# Patient Record
Sex: Male | Born: 1940 | Race: White | Hispanic: No
Health system: Midwestern US, Academic
[De-identification: ages and names within clinical notes are randomized; demographics above are authoritative.]

---

## 2016-06-11 MED ORDER — AMIODARONE 200 MG PO TAB
ORAL_TABLET | Freq: Two times a day (BID) | ORAL | 1 refills | 42.00000 days | Status: DC
Start: 2016-06-11 — End: 2016-10-23

## 2016-07-02 ENCOUNTER — Encounter: Admit: 2016-07-02 | Discharge: 2016-07-02 | Payer: MEDICARE

## 2016-07-02 DIAGNOSIS — Z79899 Other long term (current) drug therapy: ICD-10-CM

## 2016-07-02 DIAGNOSIS — I10 Essential (primary) hypertension: ICD-10-CM

## 2016-07-02 DIAGNOSIS — I480 Paroxysmal atrial fibrillation: Principal | ICD-10-CM

## 2016-07-02 NOTE — Progress Notes
Labs and/or CXR for amiodarone surveillance due per Amiodarone protocol.  Lab requisitions/orders and letter explaining need for testing mailed to patient or given to patient in clinic.

## 2016-07-14 ENCOUNTER — Encounter: Admit: 2016-07-14 | Discharge: 2016-07-14 | Payer: MEDICARE

## 2016-07-14 MED ORDER — ELIQUIS 5 MG PO TAB
ORAL_TABLET | Freq: Two times a day (BID) | 3 refills | Status: SS
Start: 2016-07-14 — End: 2016-12-07

## 2016-07-16 ENCOUNTER — Ambulatory Visit: Admit: 2016-07-16 | Discharge: 2016-07-16 | Payer: MEDICARE

## 2016-07-16 DIAGNOSIS — I10 Essential (primary) hypertension: ICD-10-CM

## 2016-07-16 DIAGNOSIS — Z79899 Other long term (current) drug therapy: ICD-10-CM

## 2016-07-16 DIAGNOSIS — I480 Paroxysmal atrial fibrillation: Principal | ICD-10-CM

## 2016-07-16 LAB — LIVER FUNCTION PANEL
Lab: 0.2 mg/dL (ref <0.4)
Lab: 0.9 mg/dL (ref 0.3–1.2)
Lab: 103 U/L (ref 25–110)
Lab: 36 U/L (ref 7–40)
Lab: 43 U/L (ref 7–56)
Lab: 6.5 g/dL (ref 6.0–8.0)

## 2016-07-16 LAB — FREE T4-FREE THYROXINE: Lab: 1.4 ng/dL (ref 0.6–1.6)

## 2016-07-16 LAB — TSH WITH FREE T4 REFLEX

## 2016-07-16 NOTE — Progress Notes
Amiodarone Monitoring status as of 07/16/16:     Amiodarone monitoring complete.  Next amiodarone review is due in 180 days.    Most recent lab results  Lab Results   Component Value Date/Time    AST 36 07/16/2016 11:27 AM    ALT 43 07/16/2016 11:27 AM    TSH <0.010 (L) 07/16/2016 11:27 AM    FREET4R 1.4 07/16/2016 11:27 AM         Procedures  Last chest X-Ray: 12/2015  Last PFT: completed  Last eye exam:

## 2016-07-16 NOTE — Progress Notes
LFT complete.  Awaitig TSH results.

## 2016-07-23 ENCOUNTER — Encounter: Admit: 2016-07-23 | Discharge: 2016-07-23 | Payer: MEDICARE

## 2016-07-23 MED ORDER — METOPROLOL TARTRATE 50 MG PO TAB
ORAL_TABLET | Freq: Two times a day (BID) | ORAL | 3 refills | 90.00000 days | Status: AC
Start: 2016-07-23 — End: 2016-07-29

## 2016-07-29 ENCOUNTER — Encounter: Admit: 2016-07-29 | Discharge: 2016-07-29 | Payer: MEDICARE

## 2016-07-29 MED ORDER — METOPROLOL TARTRATE 50 MG PO TAB
50 mg | ORAL_TABLET | Freq: Two times a day (BID) | ORAL | 3 refills | Status: SS
Start: 2016-07-29 — End: 2018-05-27

## 2016-07-29 NOTE — Telephone Encounter
Wife left a message to clarify metoprolol dosing.  Wife reports patient is on metoprolol 50 mg one tab twice daily, a refill was sent in as 2 tabs twice daily, but it appears according to our records it should be 50 mg twice daily.  Left message on patient's voicemail with correct directions.

## 2016-08-18 ENCOUNTER — Encounter: Admit: 2016-08-18 | Discharge: 2016-08-18 | Payer: MEDICARE

## 2016-08-18 MED ORDER — ATORVASTATIN 40 MG PO TAB
ORAL_TABLET | Freq: Every day | 3 refills | Status: AC
Start: 2016-08-18 — End: 2017-04-06

## 2016-08-25 ENCOUNTER — Encounter: Admit: 2016-08-25 | Discharge: 2016-08-25 | Payer: MEDICARE

## 2016-08-25 ENCOUNTER — Encounter: Admit: 2016-08-25 | Discharge: 2016-08-26 | Payer: MEDICARE

## 2016-08-25 DIAGNOSIS — R69 Illness, unspecified: Principal | ICD-10-CM

## 2016-10-07 ENCOUNTER — Encounter: Admit: 2016-10-07 | Discharge: 2016-10-07 | Payer: MEDICARE

## 2016-10-23 ENCOUNTER — Encounter: Admit: 2016-10-23 | Discharge: 2016-10-23 | Payer: MEDICARE

## 2016-10-23 ENCOUNTER — Ambulatory Visit: Admit: 2016-10-23 | Discharge: 2016-10-24 | Payer: MEDICARE

## 2016-10-23 DIAGNOSIS — I2510 Atherosclerotic heart disease of native coronary artery without angina pectoris: ICD-10-CM

## 2016-10-23 DIAGNOSIS — R011 Cardiac murmur, unspecified: ICD-10-CM

## 2016-10-23 DIAGNOSIS — I350 Nonrheumatic aortic (valve) stenosis: Principal | ICD-10-CM

## 2016-10-23 DIAGNOSIS — I480 Paroxysmal atrial fibrillation: ICD-10-CM

## 2016-10-23 DIAGNOSIS — I10 Essential (primary) hypertension: Principal | ICD-10-CM

## 2016-10-23 MED ORDER — AMIODARONE 200 MG PO TAB
200 mg | ORAL_TABLET | Freq: Every day | ORAL | 1 refills | 42.00000 days | Status: AC
Start: 2016-10-23 — End: 2017-02-24

## 2016-10-23 NOTE — Assessment & Plan Note
BP OK on current medical therapy.

## 2016-10-23 NOTE — Assessment & Plan Note
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  Planning repeat echocardiogram when I see him back in 6 months.

## 2016-10-23 NOTE — Progress Notes
Date of Service: 10/23/2016    Joel Hernandez is a 76 y.o. male.       HPI     Joel Hernandez was in the [place] office today for follow-up regarding coronary disease, paroxysmal atrial fibrillation, and aortic stenosis.    He has developed a foot drop on the right and has an appointment to see a [facility] neurosurgeon in a couple of weeks.  He is hopeful that whatever surgical procedure may be necessary could be done before the end of the year and we talked about management of his antiplatelet and anticoagulant medications should that be necessary.    He had typical angina leading up to the MI and coronary stent last December.  He has had no recurrence of these angina symptoms.    He has aortic stenosis and his last echocardiogram was in December, showing moderate stenosis at that time.  He has had the development of no heart failure or lightheadedness symptoms that would suggest significant progression of valve disease.    He has had no bleeding complications while on oral anticoagulation and antiplatelet medications.  He denies any TIA or stroke symptoms.           Vitals:    10/23/16 1320 10/23/16 1326   BP: 142/78 138/72   Pulse: 62    Weight: 99 kg (218 lb 3.2 oz)    Height: 1.829 m (6')      Body mass index is 29.59 kg/m???.     Past Medical History  Patient Active Problem List    Diagnosis Date Noted   ??? Coronary artery disease due to calcified coronary lesion 01/19/2016     12/2015 - NSTEMI, LAD stent     ??? Paroxysmal atrial fibrillation (HCC) 01/18/2016     01/2016 - AF noted during hospitalization for unstable angina.  Attempt at DC cardioversion unsuccessful.  DC'ed on amiodarone & Eliquis.    01/2016 - Successful outpatient cardioversion at [facility].  Amiodarone continued    02/2016 - Office follow-up--back in AF, asymptomatic with good rate control.     ??? Hypertension 09/15/2013     BP medication started in mid-30's     ??? Aortic stenosis 09/15/2013 09/20/2013:  Echo ([place]) shows moderate AS, mean gradient 20.  Peak gradient 44.1 mmHg.  AVA 2.1 cm2.  EF 72%.  Aorta normal.    12/2015:  Moderate AS by echo     ??? Obstructive sleep apnea 09/15/2013     Circa 2004 - Diagnosis established, CPAP started (Dr. Mayford Knife, pulmonologist in [facility])           Review of Systems   Constitution: Negative.   HENT: Negative.    Eyes: Negative.    Cardiovascular: Negative.    Respiratory: Negative.    Endocrine: Negative.    Hematologic/Lymphatic: Negative.    Skin: Negative.    Musculoskeletal: Positive for back pain and joint pain.   Gastrointestinal: Negative.    Genitourinary: Negative.    Neurological: Negative.    Psychiatric/Behavioral: Negative.    Allergic/Immunologic: Negative.        Physical Exam    Physical Exam   General Appearance: no distress   Skin: warm, no ulcers or xanthomas   Digits and Nails: no cyanosis or clubbing   Eyes: conjunctivae and lids normal, pupils are equal and round   Teeth/Gums/Palate: dentition unremarkable, no lesions   Lips & Oral Mucosa: no pallor or cyanosis   Neck Veins: normal JVP , neck veins are  not distended   Thyroid: no nodules, masses, tenderness or enlargement   Chest Inspection: chest is normal in appearance   Respiratory Effort: breathing comfortably, no respiratory distress   Auscultation/Percussion: lungs clear to auscultation, no rales or rhonchi, no wheezing   PMI: PMI not enlarged or displaced   Cardiac Rhythm: regular rhythm and normal rate   Cardiac Auscultation: S1, S2 normal, no rub, no gallop   Murmurs: grade 3 systolic murmur   Peripheral Circulation: normal peripheral circulation   Carotid Arteries: normal carotid upstroke bilaterally, no bruits   Radial Arteries: normal symmetric radial pulses   Abdominal Aorta: no abdominal aortic bruit   Pedal Pulses: normal symmetric pedal pulses   Lower Extremity Edema: no lower extremity edema   Abdominal Exam: soft, non-tender, no masses, bowel sounds normal Liver & Spleen: no organomegaly   Gait & Station: walks without assistance   Muscle Strength: normal muscle tone   Orientation: oriented to time, place and person   Affect & Mood: appropriate and sustained affect   Language and Memory: patient responsive and seems to comprehend information   Neurologic Exam: neurological assessment grossly intact   Other: moves all extremities      Problems Addressed Today  Encounter Diagnoses   Name Primary?   ??? Nonrheumatic aortic valve stenosis    ??? Essential hypertension    ??? Coronary artery disease due to calcified coronary lesion    ??? 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  Planning repeat echocardiogram when I see him back in 6 months.    Hypertension  BP OK on current medical therapy.    Coronary artery disease due to calcified coronary lesion  No recurrence of typical angina symptoms.  He's on Plavix without aspirin due to the use of Eliquis for PAF.  In December we'll call him to remind him to stop the Plavix and resume aspirin 81 mg/day.    If he needs to suspend Plavix for neurosurgical procedure before December I think that is OK.  We can address this issue if necessary, depending on surgical scheduling.    Paroxysmal atrial fibrillation (HCC)  He is done well on amiodarone and was in sinus rhythm today.  He has had no recurrence of the types of symptoms that he had while in atrial fibrillation.  He is tolerating oral anticoagulation without bleeding complications.      Current Medications (including today's revisions)  ??? amiodarone (CORDARONE) 200 mg tablet Take one tablet by mouth daily. Take with food.   ??? atorvastatin (LIPITOR) 40 mg tablet TAKE 1 TABLET EVERY DAY   ??? clopiDOGrel (PLAVIX) 75 mg tablet TAKE ONE TABLET BY MOUTH ONCE DAILY   ??? ELIQUIS 5 mg tablet TAKE 1 TABLET TWICE DAILY   ??? losartan(+) (COZAAR) 100 mg tablet Take 0.5 tablets by mouth daily. ??? metoprolol tartrate (LOPRESSOR) 50 mg tablet Take 1 tablet by mouth twice daily.   ??? nitroglycerin (NITROSTAT) 0.4 mg tablet Place 1 tablet under tongue every 5 minutes as needed for Chest Pain. Max of 3 tablets, call 911.   ??? tamsulosin (FLOMAX) 0.4 mg capsule Take 1 capsule by mouth daily.

## 2016-10-23 NOTE — Assessment & Plan Note
He is done well on amiodarone and was in sinus rhythm today.  He has had no recurrence of the types of symptoms that he had while in atrial fibrillation.  He is tolerating oral anticoagulation without bleeding complications.

## 2016-10-23 NOTE — Assessment & Plan Note
No recurrence of typical angina symptoms.  He's on Plavix without aspirin due to the use of Eliquis for PAF.  In December we'll call him to remind him to stop the Plavix and resume aspirin 81 mg/day.    If he needs to suspend Plavix for neurosurgical procedure before December I think that is OK.  We can address this issue if necessary, depending on surgical scheduling.

## 2016-11-07 ENCOUNTER — Ambulatory Visit: Admit: 2016-11-07 | Discharge: 2016-11-08 | Payer: MEDICARE

## 2016-11-07 ENCOUNTER — Encounter: Admit: 2016-11-07 | Discharge: 2016-11-07 | Payer: MEDICARE

## 2016-11-07 DIAGNOSIS — M21371 Foot drop, right foot: Principal | ICD-10-CM

## 2016-11-07 DIAGNOSIS — I4891 Unspecified atrial fibrillation: ICD-10-CM

## 2016-11-07 DIAGNOSIS — C4490 Unspecified malignant neoplasm of skin, unspecified: ICD-10-CM

## 2016-11-07 DIAGNOSIS — R011 Cardiac murmur, unspecified: ICD-10-CM

## 2016-11-07 DIAGNOSIS — G4730 Sleep apnea, unspecified: ICD-10-CM

## 2016-11-07 DIAGNOSIS — M48061 Spinal stenosis, lumbar region without neurogenic claudication: Principal | ICD-10-CM

## 2016-11-07 DIAGNOSIS — D696 Thrombocytopenia, unspecified: ICD-10-CM

## 2016-11-07 DIAGNOSIS — G4733 Obstructive sleep apnea (adult) (pediatric): Secondary | ICD-10-CM

## 2016-11-07 DIAGNOSIS — I10 Essential (primary) hypertension: Principal | ICD-10-CM

## 2016-11-07 MED ORDER — CEFAZOLIN INJ 1GM IVP
2 g | Freq: Once | INTRAVENOUS | 0 refills | Status: CN
Start: 2016-11-07 — End: ?

## 2016-11-07 NOTE — Progress Notes
Date of Service: 11/07/2016    Subjective:             Joel Hernandez is a 76 y.o. male.    History of Present Illness  Referring physician: Macario Hernandez (NP neurosurgery)    76 year old male who is referred to [place] from [facility] for evaluation of right foot drop.  He developed a foot drop in July of this year.   he does not know of any exact inciting event, however he has a new puppy and feels that he may have fallen a few times during that.  He has had some numbness in that foot as well.  He does not have a lot of pain.  He has been using a brace for his right foot, which has helped him with his foot drop.    He has had surgery in 2004 for a lumbar laminectomy at L4-5.  He then went on to have right hemilaminectomies at L2-3 and L3-4 in 2011.  These were both performed by Dr. Rosita Kea.    He saw Dr. Alonna Minium in [place], however, he is leaving.  He is therefore been sent to [place] for evaluation.    He is on Plavix for cardiac stent that was put in earlier this year in January.  He sees Dr. Edwinna Areola from radiology.  He is also on Eliquis for atrial fibrillation.     Review of Systems   Respiratory: Positive for apnea.         Uses CPAP   Cardiovascular:        Atrial Fibrillation    Gastrointestinal:        GERD   Genitourinary: Positive for frequency.   Musculoskeletal: Positive for back pain and gait problem.   Neurological: Positive for numbness.   All other systems reviewed and are negative.    Past Medical History:   Diagnosis Date   ??? Atrial fibrillation (HCC)    ??? Heart murmur 09/15/2013   ??? Hypertension 09/15/2013   ??? Skin cancer    ??? Sleep apnea      Past Surgical History:   Procedure Laterality Date   ??? SPINE SURGERY       History reviewed. No pertinent family history.  Social History     Social History   ??? Marital status: Married     Spouse name: N/A   ??? Number of children: N/A   ??? Years of education: N/A     Social History Main Topics   ??? Smoking status: Former Smoker ??? Smokeless tobacco: Never Used   ??? Alcohol use Yes   ??? Drug use: No   ??? Sexual activity: Not on file     Other Topics Concern   ??? Not on file     Social History Narrative   ??? No narrative on file       Objective:         ??? amiodarone (CORDARONE) 200 mg tablet Take one tablet by mouth daily. Take with food.   ??? atorvastatin (LIPITOR) 40 mg tablet TAKE 1 TABLET EVERY DAY   ??? clopiDOGrel (PLAVIX) 75 mg tablet TAKE ONE TABLET BY MOUTH ONCE DAILY   ??? ELIQUIS 5 mg tablet TAKE 1 TABLET TWICE DAILY   ??? losartan(+) (COZAAR) 100 mg tablet Take 0.5 tablets by mouth daily.   ??? metoprolol tartrate (LOPRESSOR) 50 mg tablet Take 1 tablet by mouth twice daily.   ??? nitroglycerin (NITROSTAT) 0.4 mg tablet Place 1  tablet under tongue every 5 minutes as needed for Chest Pain. Max of 3 tablets, call 911.   ??? tamsulosin (FLOMAX) 0.4 mg capsule Take 1 capsule by mouth daily.     There were no vitals filed for this visit.  There is no height or weight on file to calculate BMI.     Physical Exam  Constitutional: Well-developed, well nourished  HENT: Normocephalic, atraumatic, Oropharynx clear and moist  Eyes: Pupils equal round and reactive to light, Extraocular muscles normal  Cardiovascular: Heart sounds normal  Pulmonary: Effort normal  Abdominal: No tenderness  Musculoskeletal: ROM normal  Skin: Warm, dry  Neurological: A&amp;Ox3, speech normal  CN: Facial sensation equal, Smile equal, eye closure equal, hearing present bilaterally, palate rises  bilaterally, shoulder shrug equal, tongue protrusion midline  5/5 strength x 4 extremities except 2/5 right foot dorsiflexion, 0/5 EHL  Soft touch present x 4 extremities  Back incisions well healed    MRI of the lumbar spine reviewed which shows multilevel degenerative changes.  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  The area of interest is L4-5 given his right foot drop.  There appears to be right lateral foraminal stenosis and lateral recess stenosis.  The axial cut is at an odd angle, however the sagittals going laterally show that there is some impingement on the L5 nerve root as it exits.     Assessment and Plan:  76 year old male who presents with a right foot drop and foot numbness.  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  He has had 2 previous laminectomies in 2004 and 2011.           I discussed his case with the patient and his wife in the clinic today.  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  He has had this for about 3 months.  He has had multiple surgeries in the past.  I discussed with them that I would recommend doing a redo exploration of L4-5 with the focus on the lateral recess decompression at L4-5.  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  I would like to focus on his area of symptomatic concern and the possibility of needing future surgery may be needed at some point.    They would like to do the surgery on November 8 after his wife gets back from an out-of-town trip to [place].  He will need to see PAT clinic and get medical clearance.  He will also need to be off of his Plavix and Eliquis prior to and after surgery.    I am recommending an L4-5 redo laminectomy and lateral recess decompression.  I discussed the indications, risks, alternatives, and potential complications of the surgery.  I did tell him that there is an increased risk of CSF leak given the surgeries in the past and scar tissue formation.  I also told him and his wife that it is unpredictable if his foot drop will improve after surgery.  They wish to proceed with surgery.    Thank you for allowing me to take care of [name]. If you have any questions or concerns, do not hesitate to contact me at [phone].     Theotis Barrio MD

## 2016-11-10 ENCOUNTER — Encounter: Admit: 2016-11-10 | Discharge: 2016-11-10 | Payer: MEDICARE

## 2016-11-10 DIAGNOSIS — R69 Illness, unspecified: Principal | ICD-10-CM

## 2016-11-17 ENCOUNTER — Encounter: Admit: 2016-11-17 | Discharge: 2016-11-17 | Payer: MEDICARE

## 2016-11-20 ENCOUNTER — Encounter: Admit: 2016-11-20 | Discharge: 2016-11-20 | Payer: MEDICARE

## 2016-11-20 ENCOUNTER — Ambulatory Visit: Admit: 2016-11-20 | Discharge: 2016-11-21 | Payer: MEDICARE

## 2016-11-20 ENCOUNTER — Inpatient Hospital Stay: Admit: 2016-12-04 | Discharge: 2016-12-04 | Payer: MEDICARE

## 2016-11-20 DIAGNOSIS — I10 Essential (primary) hypertension: Principal | ICD-10-CM

## 2016-11-20 DIAGNOSIS — C4490 Unspecified malignant neoplasm of skin, unspecified: ICD-10-CM

## 2016-11-20 DIAGNOSIS — C44621 Squamous cell carcinoma of skin of unspecified upper limb, including shoulder: ICD-10-CM

## 2016-11-20 DIAGNOSIS — M21371 Foot drop, right foot: Secondary | ICD-10-CM

## 2016-11-20 DIAGNOSIS — G4730 Sleep apnea, unspecified: ICD-10-CM

## 2016-11-20 DIAGNOSIS — I2510 Atherosclerotic heart disease of native coronary artery without angina pectoris: ICD-10-CM

## 2016-11-20 DIAGNOSIS — I4891 Unspecified atrial fibrillation: ICD-10-CM

## 2016-11-20 DIAGNOSIS — R011 Cardiac murmur, unspecified: ICD-10-CM

## 2016-11-20 DIAGNOSIS — G4733 Obstructive sleep apnea (adult) (pediatric): ICD-10-CM

## 2016-11-20 DIAGNOSIS — E785 Hyperlipidemia, unspecified: ICD-10-CM

## 2016-11-20 DIAGNOSIS — I350 Nonrheumatic aortic (valve) stenosis: ICD-10-CM

## 2016-11-20 DIAGNOSIS — I214 Non-ST elevation (NSTEMI) myocardial infarction: ICD-10-CM

## 2016-11-20 LAB — BASIC METABOLIC PANEL
Lab: 0.9 mg/dL — ABNORMAL LOW (ref 0.4–1.24)
Lab: 105 MMOL/L (ref 98–110)
Lab: 14 mg/dL (ref 7–25)
Lab: 140 MMOL/L — ABNORMAL LOW (ref 137–147)
Lab: 28 MMOL/L (ref 21–30)
Lab: 3.7 MMOL/L (ref 3.5–5.1)
Lab: 7 pg (ref 3–12)
Lab: 9.4 mg/dL — ABNORMAL HIGH (ref 8.5–10.6)
Lab: >60 mL/min (ref >60)
Lab: >60 mL/min (ref >60)

## 2016-11-20 LAB — CBC: Lab: 6.9 10*3/uL (ref 4.5–11.0)

## 2016-11-20 NOTE — Pre-Anesthesia Patient Instructions
GENERAL INFORMATION    Before you come to the hospital  ??? Make arrangements for a responsible adult to drive you home and stay with you for 24 hours following surgery.  ??? Bath/Shower Instructions  ??? Take a bath or shower using the special soap given to you in PAC. Use half the bottle the night before, and the other half the morning of your procedure. Use clean towels with each bath or shower.  ??? Put on clean clothes after bath or shower.  Avoid using lotion and oils.  ??? If you are having surgery above the waist, wear a shirt that fastens up the front.  ??? Sleep on clean sheets if bath or shower is done the night before procedure.  ??? Leave money, credit cards, jewelry, and any other valuables at home. The [facility] is not responsible for the loss or breakage of personal items.  ??? Remove nail polish, makeup and all jewelry (including piercings) before coming to the hospital.  ??? The morning of your procedure:  ??? brush your teeth and tongue  ??? do not smoke  ??? do not shave the area where you will have surgery    What to bring to the hospital  ??? ID/ Insurance Card  ??? Medical Device card  ??? Official documents for legal guardianship   ??? Copy of your Living Will, Advanced Directives, and/or Durable Power of Attorney   ??? Small bag with a few personal belongings  ??? Cases for glasses/hearing aids/contact lens (bring solutions for contacts)  ??? Dress in clean, loose, comfortable clothing     Eating or drinking before surgery  ??? Do not eat or drink anything after 11:00 p.m. the day before your procedure (including gum, mints, candy, or chewing tobacco) OR follow the specific instructions you were given by your Surgeon.  ??? You may have WATER ONLY up to 2 hours before arriving at the hospital.  ??? Other instructions: None     Other instructions  Notify your surgeon if:  ??? there is a possibility that you are pregnant  ??? you become ill with a cough, fever, sore throat, nausea, vomiting or flu-like symptoms ??? you have any open wounds/sores that are red, painful, draining, or are new since you last saw  the doctor  ??? you need to cancel your procedure  ??? You will receive a call with your surgery arrival time from between 2:30pm and 4:30pm the last business day before your procedure.  If you do not receive a call, please call [phone] before 4:30pm or [phone] after 4:30pm.    Notify us at [phone] and ask for [employer]  ??? if you need to cancel your procedure  ??? if you are going to be late    Arrival at the hospital    [facility]  [address]  [place], [place] [address]    ? Park in the P5 parking garage located at [employer], [place], [place] [address].   ? Judee Clara parking is available in front of [employer] [facility] between the hours of 7:00 am and 4:00 pm Monday through Friday.  ? If parking in the P5 garage, take the east elevators in the parking garage to the second level and walk to the entrance of the [employer] [facility].    ? Enter through the 1st floor main entrance and check in with Information Desk.   ? If you are a woman between the ages of 65  and 55, and have not had a hysterectomy, you will be asked for a urine sample prior to surgery.  Please do not urinate before arriving in the Surgery Waiting Room.  Once there, check in and let the attendant know if you need to provide a sample.

## 2016-11-21 DIAGNOSIS — Z01810 Encounter for preprocedural cardiovascular examination: Principal | ICD-10-CM

## 2016-11-21 DIAGNOSIS — M21371 Foot drop, right foot: ICD-10-CM

## 2016-11-24 ENCOUNTER — Encounter: Admit: 2016-11-24 | Discharge: 2016-11-24 | Payer: MEDICARE

## 2016-11-24 DIAGNOSIS — D696 Thrombocytopenia, unspecified: Principal | ICD-10-CM

## 2016-11-25 ENCOUNTER — Encounter: Admit: 2016-11-25 | Discharge: 2016-11-25 | Payer: MEDICARE

## 2016-11-25 NOTE — Telephone Encounter
Navigation Intake Assessment Document    Patient Name:  Joel Hernandez  MRN:  [id number]  DOB:  10/03/1940  Insurance:  Medicare    Appointment Info:    Future Appointments  Date Time Provider [place]   12/01/2016 3:15 PM Gara Kroner, DO [facility] [place] Exam       Diagnosis & Reason for Visit:  Thrombocytopenia-surgical clearance.  Patient is scheduled with neurosurgery 12/04/2016 for right lumbar 4-5 re-do laminectomy.  Patient had pre-op labs drawn 11/20/2016 which revealed a platelet count of 91.   He has no previous history of thrombocytopenia.  In speaking with the patient he does not relay any symptoms.    He was referred to [facility] hematology services for further evaluation and surgical clearance.  All records are in [facility].    Physician Info:   Referring Physician:  Bridgette Habermann Neurosurgery  [id number]   PCP:  Darl Householder

## 2016-12-01 ENCOUNTER — Encounter: Admit: 2016-12-01 | Discharge: 2016-12-01 | Payer: MEDICARE

## 2016-12-01 DIAGNOSIS — Z85828 Personal history of other malignant neoplasm of skin: ICD-10-CM

## 2016-12-01 DIAGNOSIS — G4730 Sleep apnea, unspecified: ICD-10-CM

## 2016-12-01 DIAGNOSIS — C44621 Squamous cell carcinoma of skin of unspecified upper limb, including shoulder: ICD-10-CM

## 2016-12-01 DIAGNOSIS — M549 Dorsalgia, unspecified: ICD-10-CM

## 2016-12-01 DIAGNOSIS — I10 Essential (primary) hypertension: Principal | ICD-10-CM

## 2016-12-01 DIAGNOSIS — I4891 Unspecified atrial fibrillation: ICD-10-CM

## 2016-12-01 DIAGNOSIS — R011 Cardiac murmur, unspecified: ICD-10-CM

## 2016-12-01 DIAGNOSIS — I2510 Atherosclerotic heart disease of native coronary artery without angina pectoris: ICD-10-CM

## 2016-12-01 DIAGNOSIS — I214 Non-ST elevation (NSTEMI) myocardial infarction: ICD-10-CM

## 2016-12-01 DIAGNOSIS — D731 Hypersplenism: ICD-10-CM

## 2016-12-01 DIAGNOSIS — Z87891 Personal history of nicotine dependence: ICD-10-CM

## 2016-12-01 DIAGNOSIS — C4490 Unspecified malignant neoplasm of skin, unspecified: ICD-10-CM

## 2016-12-01 DIAGNOSIS — Z7901 Long term (current) use of anticoagulants: ICD-10-CM

## 2016-12-01 DIAGNOSIS — E785 Hyperlipidemia, unspecified: ICD-10-CM

## 2016-12-01 DIAGNOSIS — G4733 Obstructive sleep apnea (adult) (pediatric): ICD-10-CM

## 2016-12-01 DIAGNOSIS — I350 Nonrheumatic aortic (valve) stenosis: ICD-10-CM

## 2016-12-01 DIAGNOSIS — D696 Thrombocytopenia, unspecified: Principal | ICD-10-CM

## 2016-12-01 NOTE — Progress Notes
Name: Joel Hernandez          MRN: [id number]      DOB: [birth year]      AGE: 76 y.o.   DATE OF SERVICE: 12/01/2016    Subjective:             Reason for Visit:  No chief complaint on file.      Joel Hernandez is a 76 y.o. male who presents to my hematology clinic upon referral by his neurosurgeon Dr. Sherryll Burger for his thrombocytopenia. Patient has never been told his platelets were low.    Hematology history:  Diagnosis & Reason for Visit: ???  Thrombocytopenia-surgical clearance.  Patient is scheduled with neurosurgery 12/04/2016 for right lumbar 4-5 re-do laminectomy.  Patient had pre-op labs drawn 11/20/2016 which revealed a platelet count of 91.   He has no previous history of thrombocytopenia.  In speaking with the patient he does not relay any symptoms.    He was referred to [facility] hematology services for further evaluation and surgical clearance.  All records are in O2.  ???  Physician Info:  ??? Referring Physician: ???Marcelyn Bruins Neurosurgery  [id number]  ??? PCP:??? Mcgarrett Suzanna Obey      History of Present Illness  Past Medical History:    Past Medical History:   Diagnosis Date   ??? Aortic stenosis    ??? Atrial fibrillation (HCC)    ??? CAD (coronary artery disease)    ??? Heart murmur 09/15/2013   ??? Hyperlipidemia    ??? Hypertension 09/15/2013   ??? NSTEMI (non-ST elevated myocardial infarction) (HCC) 12/2015    s/p stent placement   ??? OSA on CPAP    ??? SCC (squamous cell carcinoma), arm    ??? Skin cancer    ??? Sleep apnea        Past Surgical History:   Past Surgical History:   Procedure Laterality Date   ??? LUMBAR LAMINECTOMY  2004    L4-5   ??? LUMBAR LAMINECTOMY  2011    Hemilaminectomies L 2-3 L3-4   ??? HEART CATHETERIZATION  01/2016    Stent x 1 LAD placement following NSTEMI   ??? SKIN CANCER EXCISION  2017, 2018    SCC Arm        Transfusion History: No    Medication list:   Current Outpatient Prescriptions:   ???  amiodarone (CORDARONE) 200 mg tablet, Take one tablet by mouth daily. Take with food., Disp: 60 tablet, Rfl: 1 ???  atorvastatin (LIPITOR) 40 mg tablet, TAKE 1 TABLET EVERY DAY (Patient taking differently: TAKE 1 TABLET BY MOUTH EVERY DAY), Disp: 90 tablet, Rfl: 3  ???  clopiDOGrel (PLAVIX) 75 mg tablet, TAKE ONE TABLET BY MOUTH ONCE DAILY, Disp: 90 tablet, Rfl: 3  ???  ELIQUIS 5 mg tablet, TAKE 1 TABLET TWICE DAILY, Disp: 180 tablet, Rfl: 3  ???  losartan(+) (COZAAR) 100 mg tablet, Take 0.5 tablets by mouth daily., Disp: 30 tablet, Rfl: 1  ???  metoprolol tartrate (LOPRESSOR) 50 mg tablet, Take 1 tablet by mouth twice daily., Disp: 360 tablet, Rfl: 3  ???  nitroglycerin (NITROSTAT) 0.4 mg tablet, Place 1 tablet under tongue every 5 minutes as needed for Chest Pain. Max of 3 tablets, call 911., Disp: 25 tablet, Rfl: 3  ???  tamsulosin (FLOMAX) 0.4 mg capsule, Take 1 capsule by mouth at bedtime daily., Disp: , Rfl:     Allergy List:   Allergies   Allergen Reactions   ??? Lisinopril SEE COMMENTS  Reports respiratory distress and back pain       Social History:   Social History     Social History   ??? Marital status: Married     Spouse name: N/A   ??? Number of children: N/A   ??? Years of education: N/A     Social History Main Topics   ??? Smoking status: Former Smoker     Packs/day: 0.50     Years: 15.00     Types: Cigarettes     Quit date: 11/20/1969   ??? Smokeless tobacco: Never Used   ??? Alcohol use No   ??? Drug use: No   ??? Sexual activity: Not on file     Other Topics Concern   ??? Not on file     Social History Narrative   ??? No narrative on file       Family History: No family history of any cancers.       Review of Systems   Constitutional: Negative for fatigue.   HENT: Negative for congestion.    Eyes: Negative for visual disturbance.   Respiratory: Positive for apnea. Negative for shortness of breath.    Cardiovascular: Negative for chest pain.   Gastrointestinal: Negative for abdominal pain.   Genitourinary: Positive for urgency. Negative for difficulty urinating.   Musculoskeletal: Positive for back pain.   Skin: Negative for rash. Neurological: Negative for headaches.   Hematological: Bruises/bleeds easily.   Psychiatric/Behavioral: Negative.          Objective:         ??? amiodarone (CORDARONE) 200 mg tablet Take one tablet by mouth daily. Take with food.   ??? atorvastatin (LIPITOR) 40 mg tablet TAKE 1 TABLET EVERY DAY (Patient taking differently: TAKE 1 TABLET BY MOUTH EVERY DAY)   ??? clopiDOGrel (PLAVIX) 75 mg tablet TAKE ONE TABLET BY MOUTH ONCE DAILY   ??? ELIQUIS 5 mg tablet TAKE 1 TABLET TWICE DAILY   ??? losartan(+) (COZAAR) 100 mg tablet Take 0.5 tablets by mouth daily.   ??? metoprolol tartrate (LOPRESSOR) 50 mg tablet Take 1 tablet by mouth twice daily.   ??? nitroglycerin (NITROSTAT) 0.4 mg tablet Place 1 tablet under tongue every 5 minutes as needed for Chest Pain. Max of 3 tablets, call 911.   ??? tamsulosin (FLOMAX) 0.4 mg capsule Take 1 capsule by mouth at bedtime daily.     There were no vitals filed for this visit.  There is no height or weight on file to calculate BMI.               Pain Addressed:  N/A    Patient Evaluated for a Clinical Trial: No treatment clinical trial available for this patient.     [place] Cooperative Oncology Group performance status is 0, Fully active, able to carry on all pre-disease performance without restriction.Marland Kitchen     Physical Exam   Constitutional: He is oriented to person, place, and time. He appears well-developed and well-nourished.   HENT:   Head: Normocephalic and atraumatic.   Eyes: Pupils are equal, round, and reactive to light.   Neck: Neck supple.   Cardiovascular: Normal rate and regular rhythm.    Murmur heard.  Pulmonary/Chest: Effort normal and breath sounds normal. He has no wheezes.   Abdominal: Soft. Bowel sounds are normal. He exhibits no mass.   Musculoskeletal: He exhibits no edema.   Lymphadenopathy:     He has no cervical adenopathy.        Right axillary:  No lateral adenopathy present.        Left axillary: No lateral adenopathy present. Right: No supraclavicular adenopathy present.        Left: No supraclavicular adenopathy present.   Neurological: He is alert and oriented to person, place, and time. No cranial nerve deficit.   Skin: No rash noted.   Psychiatric: He has a normal mood and affect.             Assessment and Plan:  1.Thrombocytopenia- I will recheck a CBC to see where the patient's counts are currently. I will check a CMP to check electrolytes and liver function as liver failure can be an etiology for thrombocytopenia.  I will check a B12 and folate as these nutritional deficiencies can be a cause of thrombocytopenia. I will order an abdominal ultrasound in the future as splenomegaly (hypersplenism) could be an etiology. I will consider a bone marrow biopsy in the future to rule out a marrow suppression issue.  Since he is having spine surgery, I would like his platelet count to be at greater than 100,000 so the patient will need likely a transfusion of 1 unit of platelets prior to surgery.  2. Return to clinic- Will have the patient return to clinic in three weeks.

## 2016-12-02 ENCOUNTER — Encounter: Admit: 2016-12-02 | Discharge: 2016-12-02 | Payer: MEDICARE

## 2016-12-02 DIAGNOSIS — D696 Thrombocytopenia, unspecified: Principal | ICD-10-CM

## 2016-12-02 LAB — FOLATE, SERUM

## 2016-12-02 LAB — COMPREHENSIVE METABOLIC PANEL

## 2016-12-02 LAB — CBC AND DIFF

## 2016-12-03 ENCOUNTER — Encounter: Admit: 2016-12-03 | Discharge: 2016-12-03 | Payer: MEDICARE

## 2016-12-03 DIAGNOSIS — D696 Thrombocytopenia, unspecified: Principal | ICD-10-CM

## 2016-12-04 ENCOUNTER — Encounter: Admit: 2016-12-04 | Discharge: 2016-12-04 | Payer: MEDICARE

## 2016-12-04 ENCOUNTER — Inpatient Hospital Stay: Admit: 2016-12-04 | Discharge: 2016-12-04 | Payer: MEDICARE

## 2016-12-04 DIAGNOSIS — I214 Non-ST elevation (NSTEMI) myocardial infarction: ICD-10-CM

## 2016-12-04 DIAGNOSIS — E785 Hyperlipidemia, unspecified: ICD-10-CM

## 2016-12-04 DIAGNOSIS — G4733 Obstructive sleep apnea (adult) (pediatric): ICD-10-CM

## 2016-12-04 DIAGNOSIS — C44621 Squamous cell carcinoma of skin of unspecified upper limb, including shoulder: ICD-10-CM

## 2016-12-04 DIAGNOSIS — G4730 Sleep apnea, unspecified: ICD-10-CM

## 2016-12-04 DIAGNOSIS — I2510 Atherosclerotic heart disease of native coronary artery without angina pectoris: ICD-10-CM

## 2016-12-04 DIAGNOSIS — C4490 Unspecified malignant neoplasm of skin, unspecified: ICD-10-CM

## 2016-12-04 DIAGNOSIS — I10 Essential (primary) hypertension: Principal | ICD-10-CM

## 2016-12-04 DIAGNOSIS — M549 Dorsalgia, unspecified: ICD-10-CM

## 2016-12-04 DIAGNOSIS — R011 Cardiac murmur, unspecified: ICD-10-CM

## 2016-12-04 DIAGNOSIS — I350 Nonrheumatic aortic (valve) stenosis: ICD-10-CM

## 2016-12-04 DIAGNOSIS — I4891 Unspecified atrial fibrillation: ICD-10-CM

## 2016-12-04 LAB — CBC
Lab: 115 10*3/uL — ABNORMAL LOW (ref 150–400)
Lab: 12 FL — ABNORMAL HIGH (ref 7–11)
Lab: 14 % (ref 11–15)
Lab: 14 g/dL (ref 13.5–16.5)
Lab: 32 pg (ref 26–34)
Lab: 33 g/dL (ref 32.0–36.0)
Lab: 4.5 M/UL (ref 4.4–5.5)
Lab: 43 % (ref 40–50)
Lab: 6.4 10*3/uL (ref 4.5–11.0)
Lab: 96 FL (ref 80–100)

## 2016-12-04 MED ORDER — TAMSULOSIN 0.4 MG PO CAP
.4 mg | Freq: Every evening | ORAL | 0 refills | Status: DC
Start: 2016-12-04 — End: 2016-12-07
  Administered 2016-12-05 – 2016-12-07 (×3): 0.4 mg via ORAL

## 2016-12-04 MED ORDER — ROCURONIUM 10 MG/ML IV SOLN
INTRAVENOUS | 0 refills | Status: DC
Start: 2016-12-04 — End: 2016-12-04
  Administered 2016-12-04: 14:00:00 90 mg via INTRAVENOUS

## 2016-12-04 MED ORDER — HYDROMORPHONE (PF) 2 MG/ML IJ SYRG
1-2 mg | Freq: Once | INTRAVENOUS | 0 refills | Status: DC | PRN
Start: 2016-12-04 — End: 2016-12-04

## 2016-12-04 MED ORDER — PHENYLEPHRINE IV DRIP (STD CONC)
0 refills | Status: DC
Start: 2016-12-04 — End: 2016-12-04
  Administered 2016-12-04 (×2): 1 ug/kg/min via INTRAVENOUS

## 2016-12-04 MED ORDER — OXYCODONE 5 MG PO TAB
5-10 mg | ORAL | 0 refills | Status: DC | PRN
Start: 2016-12-04 — End: 2016-12-07
  Administered 2016-12-05 – 2016-12-06 (×5): 5 mg via ORAL
  Administered 2016-12-07 (×2): 10 mg via ORAL

## 2016-12-04 MED ORDER — ONDANSETRON HCL (PF) 4 MG/2 ML IJ SOLN
INTRAVENOUS | 0 refills | Status: DC
Start: 2016-12-04 — End: 2016-12-04
  Administered 2016-12-04: 17:00:00 4 mg via INTRAVENOUS

## 2016-12-04 MED ORDER — VASOPRESSIN 20 UNIT/ML IV SOLN
0 refills | Status: DC
Start: 2016-12-04 — End: 2016-12-04
  Administered 2016-12-04: 15:00:00 2 [IU] via INTRAVENOUS
  Administered 2016-12-04 (×2): 1 [IU] via INTRAVENOUS

## 2016-12-04 MED ORDER — MAGNESIUM HYDROXIDE 2,400 MG/10 ML PO SUSP
10 mL | Freq: Every day | ORAL | 0 refills | Status: DC
Start: 2016-12-04 — End: 2016-12-07
  Administered 2016-12-04 – 2016-12-06 (×3): 10 mL via ORAL

## 2016-12-04 MED ORDER — ATORVASTATIN 40 MG PO TAB
40 mg | Freq: Every day | ORAL | 0 refills | Status: DC
Start: 2016-12-04 — End: 2016-12-07
  Administered 2016-12-05 – 2016-12-07 (×3): 40 mg via ORAL

## 2016-12-04 MED ORDER — CEFAZOLIN INJ 1GM IVP
2 g | Freq: Once | INTRAVENOUS | 0 refills | Status: CP
Start: 2016-12-04 — End: ?
  Administered 2016-12-04: 15:00:00 2 g via INTRAVENOUS

## 2016-12-04 MED ORDER — DIPHENHYDRAMINE HCL 50 MG/ML IJ SOLN
25 mg | Freq: Once | INTRAVENOUS | 0 refills | Status: DC | PRN
Start: 2016-12-04 — End: 2016-12-04

## 2016-12-04 MED ORDER — HALOPERIDOL LACTATE 5 MG/ML IJ SOLN
1 mg | Freq: Once | INTRAVENOUS | 0 refills | Status: DC | PRN
Start: 2016-12-04 — End: 2016-12-04

## 2016-12-04 MED ORDER — ONDANSETRON HCL (PF) 4 MG/2 ML IJ SOLN
4 mg | INTRAVENOUS | 0 refills | Status: DC | PRN
Start: 2016-12-04 — End: 2016-12-07

## 2016-12-04 MED ORDER — SENNOSIDES-DOCUSATE SODIUM 8.6-50 MG PO TAB
1 | Freq: Two times a day (BID) | ORAL | 0 refills | Status: DC
Start: 2016-12-04 — End: 2016-12-07
  Administered 2016-12-05 – 2016-12-07 (×5): 1 via ORAL

## 2016-12-04 MED ORDER — PROMETHAZINE 25 MG/ML IJ SOLN
6.25 mg | INTRAVENOUS | 0 refills | Status: DC | PRN
Start: 2016-12-04 — End: 2016-12-04

## 2016-12-04 MED ORDER — PNEUMOCOCCAL 23-VAL PS VACCINE 25 MCG/0.5 ML IJ SOLN
0.5 mL | Freq: Once | INTRAMUSCULAR | 0 refills | Status: DC
Start: 2016-12-04 — End: 2016-12-07

## 2016-12-04 MED ORDER — LIDOCAINE-EPINEPHRINE 1 %-1:100,000 IJ SOLN
0 refills | Status: DC
Start: 2016-12-04 — End: 2016-12-04
  Administered 2016-12-04: 16:00:00 8 mL via INTRAMUSCULAR

## 2016-12-04 MED ORDER — FENTANYL CITRATE (PF) 50 MCG/ML IJ SOLN
25 ug | INTRAVENOUS | 0 refills | Status: DC | PRN
Start: 2016-12-04 — End: 2016-12-04

## 2016-12-04 MED ORDER — ALBUMIN, HUMAN 5 % 500 ML IV SOLP (AN)(OSM)
0 refills | Status: DC
Start: 2016-12-04 — End: 2016-12-04
  Administered 2016-12-04: 17:00:00 via INTRAVENOUS

## 2016-12-04 MED ORDER — GABAPENTIN 300 MG PO CAP
300 mg | Freq: Once | ORAL | 0 refills | Status: CP
Start: 2016-12-04 — End: ?
  Administered 2016-12-04: 14:00:00 300 mg via ORAL

## 2016-12-04 MED ORDER — SODIUM CHLORIDE 0.9 % IV SOLP
1000 mL | INTRAVENOUS | 0 refills | Status: DC
Start: 2016-12-04 — End: 2016-12-05
  Administered 2016-12-04 (×2): 1000.000 mL via INTRAVENOUS
  Administered 2016-12-04: 12:00:00 1000 mL via INTRAVENOUS

## 2016-12-04 MED ORDER — PROPOFOL INJ 10 MG/ML IV VIAL
0 refills | Status: DC
Start: 2016-12-04 — End: 2016-12-04
  Administered 2016-12-04: 17:00:00 50 mg via INTRAVENOUS
  Administered 2016-12-04: 14:00:00 30 mg via INTRAVENOUS
  Administered 2016-12-04: 17:00:00 20 mg via INTRAVENOUS
  Administered 2016-12-04: 14:00:00 100 mg via INTRAVENOUS

## 2016-12-04 MED ORDER — BISACODYL 10 MG RE SUPP
10 mg | Freq: Every day | RECTAL | 0 refills | Status: DC
Start: 2016-12-04 — End: 2016-12-07
  Administered 2016-12-06: 15:00:00 10 mg via RECTAL

## 2016-12-04 MED ORDER — FENTANYL CITRATE (PF) 50 MCG/ML IJ SOLN
50 ug | INTRAVENOUS | 0 refills | Status: DC | PRN
Start: 2016-12-04 — End: 2016-12-04

## 2016-12-04 MED ORDER — OXYCODONE 5 MG PO TAB
5-10 mg | Freq: Once | ORAL | 0 refills | Status: DC | PRN
Start: 2016-12-04 — End: 2016-12-04

## 2016-12-04 MED ORDER — BUPIVACAINE-EPINEPHRINE 0.5 %-1:200,000 IJ SOLN
0 refills | Status: DC
Start: 2016-12-04 — End: 2016-12-04
  Administered 2016-12-04: 18:00:00 20 mL via INTRAMUSCULAR

## 2016-12-04 MED ORDER — ACETAMINOPHEN 1,000 MG/100 ML (10 MG/ML) IV SOLN
0 refills | Status: DC
Start: 2016-12-04 — End: 2016-12-04
  Administered 2016-12-04: 17:00:00 1000 mg via INTRAVENOUS

## 2016-12-04 MED ORDER — DEXAMETHASONE SODIUM PHOSPHATE 4 MG/ML IJ SOLN
INTRAVENOUS | 0 refills | Status: DC
Start: 2016-12-04 — End: 2016-12-04
  Administered 2016-12-04: 15:00:00 4 mg via INTRAVENOUS

## 2016-12-04 MED ORDER — METOPROLOL TARTRATE 50 MG PO TAB
50 mg | Freq: Two times a day (BID) | ORAL | 0 refills | Status: DC
Start: 2016-12-04 — End: 2016-12-07
  Administered 2016-12-05 – 2016-12-07 (×6): 50 mg via ORAL

## 2016-12-04 MED ORDER — DEXTRAN 70-HYPROMELLOSE (PF) 0.1-0.3 % OP DPET
0 refills | Status: DC
Start: 2016-12-04 — End: 2016-12-04
  Administered 2016-12-04: 14:00:00 2 [drp] via OPHTHALMIC

## 2016-12-04 MED ORDER — NITROGLYCERIN 0.4 MG SL SUBL
.4 mg | SUBLINGUAL | 0 refills | Status: DC | PRN
Start: 2016-12-04 — End: 2016-12-07

## 2016-12-04 MED ORDER — LIDOCAINE (PF) 10 MG/ML (1 %) IJ SOLN
.1-2 mL | INTRAMUSCULAR | 0 refills | Status: DC | PRN
Start: 2016-12-04 — End: 2016-12-04
  Administered 2016-12-04: 12:00:00 0.1 mL via INTRAMUSCULAR

## 2016-12-04 MED ORDER — THROMBIN (BOVINE) 5,000 UNIT TP SOLR
0 refills | Status: DC
Start: 2016-12-04 — End: 2016-12-04
  Administered 2016-12-04: 16:00:00 5000 [IU] via TOPICAL

## 2016-12-04 MED ORDER — LOSARTAN 50 MG PO TAB
100 mg | Freq: Every day | ORAL | 0 refills | Status: DC
Start: 2016-12-04 — End: 2016-12-06
  Administered 2016-12-04 – 2016-12-05 (×2): 100 mg via ORAL

## 2016-12-04 MED ORDER — AMIODARONE 200 MG PO TAB
200 mg | Freq: Every day | ORAL | 0 refills | Status: DC
Start: 2016-12-04 — End: 2016-12-07
  Administered 2016-12-05 – 2016-12-07 (×3): 200 mg via ORAL

## 2016-12-04 MED ORDER — METHOCARBAMOL 500 MG PO TAB
500 mg | Freq: Four times a day (QID) | ORAL | 0 refills | Status: DC
Start: 2016-12-04 — End: 2016-12-07
  Administered 2016-12-04 – 2016-12-07 (×11): 500 mg via ORAL

## 2016-12-04 MED ORDER — DOCUSATE SODIUM 100 MG PO CAP
100 mg | Freq: Two times a day (BID) | ORAL | 0 refills | Status: DC
Start: 2016-12-04 — End: 2016-12-07
  Administered 2016-12-05 – 2016-12-07 (×5): 100 mg via ORAL

## 2016-12-04 MED ORDER — FENTANYL CITRATE (PF) 50 MCG/ML IJ SOLN
25-50 ug | INTRAVENOUS | 0 refills | Status: DC | PRN
Start: 2016-12-04 — End: 2016-12-05

## 2016-12-04 MED ORDER — LIDOCAINE (PF) 200 MG/10 ML (2 %) IJ SYRG
0 refills | Status: DC
Start: 2016-12-04 — End: 2016-12-04
  Administered 2016-12-04: 14:00:00 200 mg via INTRAVENOUS

## 2016-12-04 MED ORDER — EPHEDRINE SULFATE 50 MG/5ML SYR (10 MG/ML) (AN)(OSM)
0 refills | Status: DC
Start: 2016-12-04 — End: 2016-12-04
  Administered 2016-12-04: 14:00:00 20 mg via INTRAVENOUS
  Administered 2016-12-04: 14:00:00 10 mg via INTRAVENOUS
  Administered 2016-12-04: 14:00:00 20 mg via INTRAVENOUS
  Administered 2016-12-04: 14:00:00 10 mg via INTRAVENOUS

## 2016-12-04 MED ORDER — BACITRACIN 50,000 UN NS 500 ML IRR BOT (OR)
0 refills | Status: DC
Start: 2016-12-04 — End: 2016-12-04
  Administered 2016-12-04 (×2): 500 mL

## 2016-12-04 MED ORDER — PHENYLEPHRINE IN 0.9% NACL(PF) 1 MG/10 ML (100 MCG/ML) IV SYRG
INTRAVENOUS | 0 refills | Status: DC
Start: 2016-12-04 — End: 2016-12-04
  Administered 2016-12-04 (×3): 100 ug via INTRAVENOUS
  Administered 2016-12-04: 15:00:00 150 ug via INTRAVENOUS
  Administered 2016-12-04 (×2): 100 ug via INTRAVENOUS

## 2016-12-04 MED ORDER — FENTANYL CITRATE (PF) 50 MCG/ML IJ SOLN
0 refills | Status: DC
Start: 2016-12-04 — End: 2016-12-04
  Administered 2016-12-04 (×2): 25 ug via INTRAVENOUS
  Administered 2016-12-04: 14:00:00 100 ug via INTRAVENOUS
  Administered 2016-12-04 (×2): 50 ug via INTRAVENOUS

## 2016-12-04 MED ORDER — LACTATED RINGERS IV SOLP
0 refills | Status: DC
Start: 2016-12-04 — End: 2016-12-04
  Administered 2016-12-04: 15:00:00 via INTRAVENOUS

## 2016-12-04 MED ORDER — ACETAMINOPHEN 325 MG PO TAB
650 mg | ORAL | 0 refills | Status: DC | PRN
Start: 2016-12-04 — End: 2016-12-07
  Administered 2016-12-06 – 2016-12-07 (×5): 650 mg via ORAL

## 2016-12-04 MED ORDER — SUGAMMADEX 100 MG/ML IV SOLN
INTRAVENOUS | 0 refills | Status: DC
Start: 2016-12-04 — End: 2016-12-04
  Administered 2016-12-04: 18:00:00 200 mg via INTRAVENOUS

## 2016-12-04 MED ORDER — CEFAZOLIN INJ 1GM IVP
2 g | INTRAVENOUS | 0 refills | Status: DC
Start: 2016-12-04 — End: 2016-12-05
  Administered 2016-12-04 – 2016-12-05 (×4): 2 g via INTRAVENOUS

## 2016-12-04 NOTE — Anesthesia Post-Procedure Evaluation
Post-Anesthesia Evaluation    Name: Joel Hernandez      MRN: [id number]     DOB: 06/03/1940     Age: 76 y.o.     Sex: male   __________________________________________________________________________     Procedure Date: 12/04/2016  Procedure: Procedure(s) with comments:  RIGHT LUMBAR 4-5 REDO LAMINECTOMY AND LATERAL RECESS DECOMPRESSION - CASE LENGTH 3 HOURS, WILSON FRAME  LAMINECTOMY/ FACETECTOMY/ FORAMINOTOMY WITH DECOMPRESSION - 1 VERTEBRAL SEGMENT - LUMBAR      Surgeon: Surgeon(s):  Reuben Likes, MD  Webb Silversmith, [facility]  Adonis Huguenin, MD    Post-Anesthesia Vitals  BP: 137/73 (11/08 1400)  Pulse: 64 (11/08 1345)  Respirations: 15 PER MINUTE (11/08 1345)  SpO2: 97 % (11/08 1400)  O2 Delivery: Nasal Cannula (11/08 1400)  SpO2 Pulse: 66 (11/08 1400)      Post Anesthesia Evaluation Note    Evaluation location: Pre/Post  Patient participation: recovered; patient participated in evaluation  Level of consciousness: alert    Pain score: 0  Pain management: adequate    Hydration: normovolemia  Airway patency: adequate    Perioperative Events  Perioperative events:  no       Post-op nausea and vomiting: no PONV    Postoperative Status  Cardiovascular status: hemodynamically stable  Respiratory status: spontaneous ventilation        Perioperative Events  Perioperative Event: No  Emergency Case Activation: No

## 2016-12-04 NOTE — Operative Report(Direct Entry)
Operative Note     Name: Joel Hernandez is a 76 y.o. male     DOB: Dec 07, 1940             MRN#: [id number]  DATE OF OPERATION: 12/04/2016    Surgeon(s) and Role:     Excell Seltzer, Elvin So, [place] - Assisting     * Wenda Overland, MD - Resident - Assisting     * Theotis Barrio, MD - Primary    Pre-op Diagnosis: Right footdrop, Right L5 radiculopathy    Post-op Diagnosis: Same    Procedure: Procedure(s) (LRB):  RIGHT LUMBAR 4-5 REDO LAMINECTOMY AND LATERAL RECESS DECOMPRESSION (Right)  LAMINECTOMY/ FACETECTOMY/ FORAMINOTOMY WITH DECOMPRESSION - 1 VERTEBRAL SEGMENT - LUMBAR    Anesthesia: General endotracheal anesthesia    Estimated Blood Loss:  125 ml    Drains: JP - suprafacial    Specimens: * No specimens in log *           Complications:  CSF leak    Implants: Lyoplant, DuraSeal Exact    Indication for Procedure: 76 year old male who presents with right sided foot drop and numbness and tingling consistent with a right L5 radiculopathy.  He has had at least 2 previous lumbar surgeries in the past.  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  After discussing the indications, risks, alternatives, and potential complications of the procedure, patient has elected to proceed with surgery.    Description of Procedure: After obtaining informed consent, patient was brought back to the operating room where orotracheal anesthesia was induced.  Patient was then flipped prone onto the [place] frame.  His previous incision was identified.  Chlorhexidine, alcohol, and ChloraPrep were applied in usual sterile fashion.  After confirming site, a proper timeout was performed.  They will towels, Ioban, and sterile drapes were applied in usual sterile fashion.  Local anesthetic was injected into the incision site.    A midline incision was made using a 10 blade scalpel.  Monopolar cautery was used to obtain hemostasis and dissect down.  There was a significant amount of scar tissue present making it difficult to delineate exactly where midline was.  We initially began dissecting down to her what we thought was the right side, however, we ended up finding out that this was the left side based on x-ray.  Therefore, we redirected our dissection to the right side.  We could identify the spinous process of L2 and the spinous process of L5, which was partial.  Using these landmarks could identify the lamina in the lateral recess.  Again, there was a significant amount of scar tissue present.    We could identify the gutter at L4 and began dissecting that.  We were working inferiorly towards L5, there was a significant amount of scar tissue present right near the neural foramen.  We then removed L5 and performed a laminectomy at L5.  There was a good plane that we could identify.  The thecal sac did appear compressed and looked like an hourglass.  As we were working towards the L4-5 interspace, there was a significant amount of scar present.  There is knuckling of the scar on the right side.  As we were dissecting there was a small injury to the dura causing CSF leakage.  The injury was very small and there was not even suturable flap.  We continue to work to decompress the right L4-5 interspace and neural foramen.  We specifically worked on lateral recess decompression.  Once it was decompressed, we were able to put a [employer] and [place] in the neural foramen.  We obtained an x-ray which confirmed this was indeed the correct level.    Hemostasis was obtained.  The wound was thoroughly irrigated with bacitracin irrigation.  A piece of Lyoplant was placed on top of the dura and DuraSeal exact was sprayed on top of it.  0 Vicryl sutures were used to close the fascia in a simple interrupted fashion.  This was done very tightly.  A single 0 Vicryl suture was used to close the fascia in a simple running fashion.  A 10 French Jackson-Pratt drain was placed in the suprafascial region.  Local anesthetic was injected into the muscular layer.  2-0 Vicryl sutures were used to close the deep dermal layer in an inverted, interrupted fashion.  The skin was closed using a 3-0 nylon suture in a simple running fashion.  The incision was cleaned and dressed in usual sterile manner.  The patient was then flipped supine onto the gurney, extubated, and taken to the recovery room in stable fashion.  All sponge and needle counts were correct.  I personally performed this procedure with the resident.    Disposition: PACU - hemodynamically stable.    Condition: stable      Post-op Instructions: Keep Flat.     Post-op Exam: Patient sedated, unable to perform neurological exam.    Theotis Barrio, MD  Pager [phone]

## 2016-12-04 NOTE — Progress Notes
Report given to RN on CA 7 for transfer of patient care

## 2016-12-04 NOTE — Progress Notes
Patient arrived to room CA706 via cart accompanied by transport. Patient transferred to the bed with assistance. Bedside safety checks completed. Initial patient assessment completed, refer to flowsheet for details. Admission skin assessment completed by:     Pressure Injury Present on Hospital Admission (within 24 hours): No    1. Occiput: No  2. Ear: No  3. Scapula: No  4. Spinous Process: No  5. Shoulder: No  6. Elbow: No  7. Iliac Crest: No  8. Sacrum/Coccyx: No  9. Ischial Tuberosity: No  10. Trochanter: No  11. Knee: No  12. Malleolus: No  13. Heel: No  14. Toes: No  15. Assessed for device associated injury Yes  16. Nursing Nutrition Assessment Completed Yes    See Doc Flowsheet for additional wound details.     INTERVENTIONS:

## 2016-12-04 NOTE — H&P (View-Only)
I have seen and examined the patient. There have been no interval changes from the H&P. Pt denies recent infection, fevers, chills, cp, or soa. Consent signed. We will proceed with Redo L4-5 laminectomy and lateral recess decompression    Theotis Barrio, MD    Most recent clinic note:    76 year old male who is referred to [place] from [facility] for evaluation of right foot drop.  He developed a foot drop in July of this year.   he does not know of any exact inciting event, however he has a new puppy and feels that he may have fallen a few times during that.  He has had some numbness in that foot as well.  He does not have a lot of pain.  He has been using a brace for his right foot, which has helped him with his foot drop.  ???  He has had surgery in 2004 for a lumbar laminectomy at L4-5.  He then went on to have right hemilaminectomies at L2-3 and L3-4 in 2011.  These were both performed by Dr. Rosita Kea.  ???  He saw Dr. Alonna Minium in [place], however, he is leaving.  He is therefore been sent to [place] for evaluation.  ???  He is on Plavix for cardiac stent that was put in earlier this year in January.  He sees Dr. Edwinna Areola from radiology.  He is also on Eliquis for atrial fibrillation.  Review of Systems   Respiratory: Positive for apnea.         Uses CPAP   Cardiovascular:        Atrial Fibrillation    Gastrointestinal:        GERD   Genitourinary: Positive for frequency.   Musculoskeletal: Positive for back pain and gait problem.   Neurological: Positive for numbness.   All other systems reviewed and are negative.  ???       Past Medical History:   Diagnosis Date   ??? Atrial fibrillation (HCC) ???   ??? Heart murmur 09/15/2013   ??? Hypertension 09/15/2013   ??? Skin cancer ???   ??? Sleep apnea ???   ???        Past Surgical History:   Procedure Laterality Date   ??? SPINE SURGERY ??? ???   ???  History reviewed. No pertinent family history.  Social History      ???        Social History   ??? Marital status: Married   ??? ??? Spouse name: N/A ??? Number of children: N/A   ??? Years of education: N/A   ???       Social History Main Topics   ??? Smoking status: Former Smoker   ??? Smokeless tobacco: Never Used   ??? Alcohol use Yes   ??? Drug use: No   ??? Sexual activity: Not on file   ???       Other Topics Concern   ??? Not on file   ???      Social History Narrative   ??? No narrative on file      ???  ???  Objective:         ??? amiodarone (CORDARONE) 200 mg tablet Take one tablet by mouth daily. Take with food.   ??? atorvastatin (LIPITOR) 40 mg tablet TAKE 1 TABLET EVERY DAY   ??? clopiDOGrel (PLAVIX) 75 mg tablet TAKE ONE TABLET BY MOUTH ONCE DAILY   ??? ELIQUIS 5 mg tablet TAKE 1  TABLET TWICE DAILY   ??? losartan(+) (COZAAR) 100 mg tablet Take 0.5 tablets by mouth daily.   ??? metoprolol tartrate (LOPRESSOR) 50 mg tablet Take 1 tablet by mouth twice daily.   ??? nitroglycerin (NITROSTAT) 0.4 mg tablet Place 1 tablet under tongue every 5 minutes as needed for Chest Pain. Max of 3 tablets, call 911.   ??? tamsulosin (FLOMAX) 0.4 mg capsule Take 1 capsule by mouth daily.   ???  There were no vitals filed for this visit.  There is no height or weight on file to calculate BMI.   ???  Physical Exam  Constitutional: Well-developed, well nourished  HENT: Normocephalic, atraumatic, Oropharynx clear and moist  Eyes: Pupils equal round and reactive to light, Extraocular muscles normal  Cardiovascular: Heart sounds normal  Pulmonary: Effort normal  Abdominal: No tenderness  Musculoskeletal: ROM normal  Skin: Warm, dry  Neurological: A&amp;Ox3, speech normal  CN: Facial sensation equal, Smile equal, eye closure equal, hearing present bilaterally, palate rises  bilaterally, shoulder shrug equal, tongue protrusion midline  5/5 strength x 4 extremities except 2/5 right foot dorsiflexion, 0/5 EHL  Soft touch present x 4 extremities  Back incisions well healed  ???  MRI of the lumbar spine reviewed which shows multilevel degenerative changes.  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  The area of interest is L4-5 given his right foot drop.  There appears to be right lateral foraminal stenosis and lateral recess stenosis.  The axial cut is at an odd angle, however the sagittals going laterally show that there is some impingement on the L5 nerve root as it exits.  Assessment and Plan:  76 year old male who presents with a right foot drop and foot numbness.  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  He has had 2 previous laminectomies in 2004 and 2011.      ???  I discussed his case with the patient and his wife in the clinic today.  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  He has had this for about 3 months.  He has had multiple surgeries in the past.  I discussed with them that I would recommend doing a redo exploration of L4-5 with the focus on the lateral recess decompression at L4-5.  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  I would like to focus on his area of symptomatic concern and the possibility of needing future surgery may be needed at some point.  ???  They would like to do the surgery on November 8 after his wife gets back from an out-of-town trip to [place].  He will need to see PAT clinic and get medical clearance.  He will also need to be off of his Plavix and Eliquis prior to and after surgery.  ???  I am recommending an L4-5 redo laminectomy and lateral recess decompression.  I discussed the indications, risks, alternatives, and potential complications of the surgery.  I did tell him that there is an increased risk of CSF leak given the surgeries in the past and scar tissue formation.  I also told him and his wife that it is unpredictable if his foot drop will improve after surgery.  They wish to proceed with surgery.  ???  Thank you for allowing me to take care of Joel Hernandez. If you have any questions or concerns,  do not hesitate to contact me at [phone]. ???  Theotis Barrio MD

## 2016-12-04 NOTE — Progress Notes
Per Dr. Manuella Ghazi due to patient platelet count being 115 on 12/04/16, he is okay with the patient not getting 1 unit of platelets before surgery since it is above 100. Dr. Manuella Ghazi will reevaluate during the procedure. 1 unit of platelets was sent to Pre/Post at 0547. The unit of platelets will be sent to the OR. Anesthesia is aware that the platelets expire at 0947 on 12/04/16.

## 2016-12-04 NOTE — Care Plan
RT Adult Assessment Note    NAME:Joel Hernandez             MRN: [id number]             DOB:05-29-1940          AGE: 76 y.o.  ADMISSION DATE: 12/04/2016             DAYS ADMITTED: LOS: 0 days    RT Treatment Plan:       Protocol Plan: Procedures  PAP: Place a nursing order for "IS Q1h While Awake" for any of Lung Expansion indicators  Oxygen/Humidity: O2 to keep SpO2 > 92%  Monitoring: Pulse oximetry BID & PRN    Additional Comments:  Impressions of the patient: No respiratory distress  Intervention(s)/outcome(s): Oxygen check/monitoring      Vital Signs:  Pulse: Pulse: 64  RR: Respirations: 16 PER MINUTE  SpO2: SpO2: 97 %  O2 Device: $$ O2 Device: Standby  Liter Flow: O2 Liter Flow: Comment (RA)  O2%:    Breath Sounds: All Breath Sounds: Clear (implies normal);Decreased  Respiratory Effort: Respiratory Effort: Non-Labored

## 2016-12-05 MED ORDER — FENTANYL CITRATE (PF) 50 MCG/ML IJ SOLN
25-50 ug | INTRAVENOUS | 0 refills | Status: DC | PRN
Start: 2016-12-05 — End: 2016-12-07

## 2016-12-05 MED ORDER — HEPARIN, PORCINE (PF) 5,000 UNIT/0.5 ML IJ SYRG
5000 [IU] | SUBCUTANEOUS | 0 refills | Status: DC
Start: 2016-12-05 — End: 2016-12-07
  Administered 2016-12-06 – 2016-12-07 (×4): 5000 [IU] via SUBCUTANEOUS

## 2016-12-05 NOTE — Progress Notes
0039 -  Notified Dr. Dellis Anes of pt with continued bloody UOP with foley and some small clots. No further orders at this time. Clarified orders for foley. Foley orders put in. Will continue to monitor.

## 2016-12-05 NOTE — Case Management (ED)
Case Management Admission Assessment    NAME:Joel Hernandez                          MRN: [id number]             DOB:10-13-1940          AGE: 76 y.o.  ADMISSION DATE: 12/04/2016             DAYS ADMITTED: LOS: 0 days      Today???s Date: 12/05/2016    Source of Information: Patient and EMR    Plan  Plan: CM Assessment, Assist PRN with SW/NCM Services, Discharge Planning for Home Anticipated  Patient is #1 Day Post-Op L4-5 redo laminectomy/decompression.   ??? Patient is unable to work with PT/OT yet due to bedrest precautions for CSF leak.  ??? NCM encouraged consideration of home health setup for patient, and let him know he could cancel later if not needed - patient declined, stating very good home support.  ??? CM needs are not fully known; Neurosurgery CM team to continue to follow patient's plan of care via EMR and team huddle; will assist with discharge planning needs as indicated.    Assessment Notes  Mr. Joel Hernandez is lying in bed at time of NCM assessment; agreeable to completing assessment at this time.   ??? NCM met with patient and provided contact information and explanation of CM roles. He was encouraged to contact case management with questions and concerns during hospitalization.   ??? Patient lives with his wife in a two-story home with zero steps in (if entering from garage) and a flight of stairs up to bedroom level.  ??? Home support is assessed to be consistent, 24/7 if needed.  ??? Patient's previous HH, LTACH, SNF, IPR, DME, outpatient therapy experience includes:  ??? Outpatient cardiac rehab ([facility])  ??? DME [facility], shower chair)     Patient Address/Phone  [address]  [place] [address]  [phone] (home)     Emergency Contact  Extended Emergency Contact Information  Primary Emergency Contact: Joel, Hernandez States  Home Phone: [phone]  Mobile Phone: [phone]  Relation: Spouse  Secondary Emergency Contact: Joel Hernandez States  Home Phone: [phone]  Relation: Son Network engineer Directive: Yes, patient has a healthcare directive  Type of Healthcare Directive: Durable power of attorney for healthcare, Psychologist, sport and exercise of Healthcare Directive: Copy in paper chart  Psych Advance Directive (Psych unit only): No, patient does not have a Health visitor  Does the patient need discharge transport arranged?: No  Transportation Name, Phone and Availability #1: Patient's daughter (here in [facility]) or son Joel Hernandez from [place] will drive him home  Does the patient use Medicaid Transportation?: No    Expected Discharge Date  Expected Discharge Date: 12/06/16  ??? Expected discharge date pending patient's medical stability; medical milestones: PT/OT evaluations, pain control, adequate return of GI function, DCP.     Living Situation Prior to Admission  ? Living Arrangements  Type of Residence: Home, independent  Living Arrangements: Spouse/significant other  How many levels in the residence?: 2  Can patient live on one level if needed?: No  Does residence have entry and/or side stairs?: No (If entering from garage)  Assistance needed prior to admit or anticipated on discharge: Yes (Anticipated)  Who provides assistance or could if needed?: Patient's wife, who is in good health. Adult son  lives locally, and patient also has friends he could call on if needed.  Are they in good health?: Yes  Can support system provide 24/7 care if needed?: Yes  ? Level of Function   Prior level of function: Independent   ? Cognitive Abilities   Cognitive Abilities: Alert and Oriented, Engages in problem solving and planning, Participates in decision making, Recognizes impact of health condition on lifestyle    Financial Resources  ? Coverage  Primary Insurance: Medicare  Secondary Insurance: Medicare Supplement  Additional Coverage: RX (Rx coverage is through [facility], and patient states no difficulty with affording monthly medications. 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 Home Rx is [facility] in [place] from [place].)      [facility] [address] [place], [place] - [address]  Phone: [phone] Fax: [phone]    Payor: MEDICARE / Plan: MEDICARE PART A AND B / Product Type: Medicare /   ? Source of Income   Source Of Income: Other retirement income - Retired Occupational hygienist from [employer]  ? Financial Assistance Needed?  Denies need for financial assistance including Rx coverage    Psychosocial Needs  ? Mental Health  Mental Health History: No  ? Substance Use History  Patient denies substance use issues.    Current/Previous Services  ? PCP  Lavone Orn, [phone], [phone]   Patient is current with PCP; has been seen within the past year.  ? Pharmacy    [facility] [address], [place] - [address]  [place] [address]  Phone: [phone] Fax: [phone]    [facility] [place], [place] - [address]  [address]  [place] [address]  Phone: [phone] Fax: [phone]    ? Durable Medical Equipment   Durable Medical Equipment at home: Single [employer], Shower Chair  ? Home Health  Receiving home health: No  ? Hemodialysis or Peritoneal Dialysis  Undergoing hemodialysis or peritoneal dialysis: No  ? Tube/Enteral Feeds  Receive tube/enteral feeds: No  ? Infusion  Receive infusions: No  ? Private Duty  Private duty help used: No  ? Home and Community Based Services  Home and community based services: No  ? [employer]: N/A  ? Hospice  Hospice: No  ? Outpatient Therapy  PT: No (But enjoyed outpatient cardiac rehab at [facility] this year.)  OT: No  SLP: No  ? Skilled Nursing Facility/Nursing Home  SNF: No  NH: No  ? Inpatient Rehab  IPR: No  ? Long-Term Acute Care Hospital  LTACH: No  ? Acute Hospital Stay  Acute Hospital Stay: In the past  Was patient's stay within the last 30 days?: No  When did patient receive care?: 03/2016 Name of hospital: [facility]  Related or Unrelated?: Unrelated    Williemae Area BSN, RN  Neurosurgery Nurse Case Manager  [phone]

## 2016-12-05 NOTE — Progress Notes
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 Aortic stenosis    ??? Atrial fibrillation (HCC)    ??? Back pain    ??? CAD (coronary artery disease)    ??? Heart murmur 09/15/2013   ??? Hyperlipidemia    ??? Hypertension 09/15/2013   ??? NSTEMI (non-ST elevated myocardial infarction) (HCC) 12/2015    s/p stent placement   ??? OSA on CPAP    ??? SCC (squamous cell carcinoma), arm    ??? Skin cancer    ??? Sleep apnea        Mobility  Progressive Mobility Level: Walk in hallway  Distance Walked (feet): 100 ft  Level of Assistance: Assist X1  Assistive Device: None  Time Tolerated: 11-30 minutes  Activity Limited By: Fatigue;Weakness    Subjective  Pertinent Dx per Physician: 76 y/o M with R foot drop, s/p L4-5 re-do laminectomy and lateral recess decompression  Precautions: Standard;Falls  Pain / Complaints: Patient agrees to participate in therapy  Pain Location: Incisional  Pain Level Current: 3  Comments: Patient on CSF leak precuations in AM. First time out of bed this afternoon.    Objective  Psychosocial Status: Willing and Cooperative to Participate  Persons Present: Family;Physical Therapist    Home Living  Type of Home: House  Home Layout: Two Level;Bed/Bath Upstairs  Financial risk analyst / Tub: Pension scheme manager: Standard  Comment: Patient reports a full flight of stairs from the main level to the second level.     Prior Function  Level Of Independence: Independent with ADLs and functional transfers;Independent with homemaking w/ ambulation  Lives With: Spouse  Receives Help From: Spouse  Other Function Comments: Patient uses an AFO on the right foot prior to surgery.     Vision  Current Vision: Does Not Wear Glasses  Comment: No acute vision deficits.    ADL's Where Assessed: Standing at Sink;Edge of Bed  Grooming Assist: Stand By Assist  Grooming Deficits: Teeth Care  LE Dressing Assist: Minimal Assist  LE Dressing Deficits: Don/Doff R Sock;Don/Doff L Sock  Functional Transfer Assist: Moderate Assist  Functional Transfer Deficits: Steadying;Supervision/Safety  Comment: Supine to sit using log roll tachnique and moderate assist x2. Patient ambulates with minimum assist x2 to minimum assist x1 at end of session. Patient with right side foot drop impacting mobility. Patient hesitant to use an assistive device and states Ill make it when referring to home discharge.    Activity Tolerance  Endurance: 3/5 Tolerates 25-30 Minutes Exercise w/Multiple Rests  Sitting Balance: 5/5 Moves/Returns Trunkal Midpoint in All Planes > 2 Inches  Comment: Patient on flat bedrest following surgery. Anticipate patients activity limitations are due to first time out of bed and will improve with upright tolerance.    Cognition  Overall Cognitive Status: WFL to Adequately Complete Self Care Tasks Safely  Orientation: Alert & Oriented x4  Attention: Awake/Alert    UE PROM  Overall BUE PROM WNL: Yes    UE AROM  Overall BUE AROM WNL: Yes  Coordination: Adequate to Complete ADLs  Grasp: Bilateral Grasp Functional for Activity    Edema  RUE Edema: No Significant Edema  LUE Edema: No Significant Edema    UE Strength / Tone  Overall Strength / Tone: WFL Able to Perform ADL Tasks  Overall Strength < 4/5: No    Education  Persons Educated: Patient  Barriers To Learning: None Noted  Teaching Methods: Verbal Instruction  Patient Response: Verbalized Understanding  Topics: Role of OT, Goals for Therapy;Home safety  Goal Formulation: With Patient    Assessment  Assessment: Decreased ADL Status;Decreased Self-Care Trans;Decreased High-Level ADLs  Prognosis: Good  Goal Formulation: Patient    AM-PAC 6 Clicks Daily Activity Inpatient  Putting on and taking off regular lower body clothes?: A Little Bathing (Including washing, rinsing, drying): A Little  Toileting, which includes using toilet, bedpan, or urinal: A Little  Putting on and taking off regular upper body clothing: None  Taking care of personal grooming such as brushing teeth: None  Eating meals?: None  Daily Activity Raw Score: 21  Standardized (t-scale) score: 44.27  CMS 0-100% Score: 32.79  CMS G Code Modifier: CJ    Plan  OT Frequency: 5x/week  OT Plan for Next Visit: Functional transfers indepedently including toilet transfers.Endurance for home discharge. Patient may benefit fro massistive device for home discharge as patient is currently refusing HH or placement.    Further Evaluation Goals  Pt Will Tolerate Further ADL Evaluation: w/in1-2 sessions    ADL Goals  Patient Will Perform All ADL's: w/ Stand By Assist  Patient Will Perform LE Dressing: w/ Stand By Assist  Patient Will Perform Toileting: w/ Stand By Assist    Functional Transfer Goals  Pt Will Perform All Functional Transfers: w/ Stand By Assist    OT Discharge Recommendations  OT Discharge Recommendations: Home with family assist, Home with Home Health, Vs, Inpatient Setting  At current level of function would recommend inpatient setting due to the following barriers: difficulty standing from edge of bed, mobility deficits, full flight of stairs to bedroom/bathroom. Anticipate pt may progress with increased out of bed activity to return home with family assist.  Equipment Recommendations: Shower Chair  Recommend ongoing assistance for: In and out of house, Transfers, Bed mobility, Ambulation, Stairs, Safety concerns    G-Codes: Self-care  [phone] Current Status:  20-39% Impairment  [id number] Goal Status:  1-19% Impairment     Based on above evaluation and clinical judgment.    Therapist: Roxy Horseman, OTR/L [phone]  Date: 12/05/2016

## 2016-12-05 NOTE — Progress Notes
OCCUPATIONAL THERAPY  NO TREATMENT NOTE     Attempted to see patient for Occupational therapy evaluation. Patient on strict bedrest this morning for a CSF leak per discussion with RN. Will follow up and provide intervention as appropriate.     Therapist: Clearence Cheek, OTR/L [phone]  Date: 12/05/2016

## 2016-12-05 NOTE — Anesthesia Pain Rounding
Anesthesia Follow-Up Evaluation: Post-Procedure Day One    Name: Joel Hernandez     MRN: [id number]     DOB: 04/17/1940     Age: 76 y.o.     Sex: male   __________________________________________________________________________     Procedure Date: 12/04/2016   Procedure: Procedure(s) with comments:  RIGHT LUMBAR 4-5 REDO LAMINECTOMY AND LATERAL RECESS DECOMPRESSION - CASE LENGTH 3 HOURS, WILSON FRAME  LAMINECTOMY/ FACETECTOMY/ FORAMINOTOMY WITH DECOMPRESSION - 1 VERTEBRAL SEGMENT - LUMBAR    Physical Assessment  Height: 182.9 cm (72)  Weight: 98.9 kg (218 lb 0.6 oz)    Vital Signs (Last Filed in 24 hours)  BP: 123/65 (11/09 0742)  Temp: 36.3 ???C (97.4 ???F) (11/09 1914)  Pulse: 56 (11/09 0742)  Respirations: 17 PER MINUTE (11/09 0742)  SpO2: 95 % (11/09 0742)  O2 Delivery: None (Room Air) (11/09 0742)  SpO2 Pulse: 66 (11/08 1400)    Patient History   Allergies  Allergies   Allergen Reactions   ??? Lisinopril SEE COMMENTS     Reports respiratory distress and back pain        Medications  Scheduled Meds:  amiodarone (CORDARONE) tablet 200 mg 200 mg Oral QDAY   atorvastatin (LIPITOR) tablet 40 mg 40 mg Oral QDAY   bisacodyl (DULCOLAX) rectal suppository 10 mg 10 mg Rectal QDAY   ceFAZolin (ANCEF) IVP 2 g 2 g Intravenous Q8H*   docusate (COLACE) capsule 100 mg 100 mg Oral BID   losartan (COZAAR) tablet 100 mg 100 mg Oral QDAY   methocarbamol (ROBAXIN) tablet 500 mg 500 mg Oral QID   metoprolol tartrate (LOPRESSOR) tablet 50 mg 50 mg Oral BID   milk of magnesia (CONC) oral suspension 10 mL 10 mL Oral QDAY   pneumococcal 23-val vaccine (PPSV23) (PNEUMOVAX 23) injection 0.5 mL 0.5 mL Intramuscular ONCE   senna/docusate (SENOKOT-S) tablet 1 tablet 1 tablet Oral BID   tamsulosin (FLOMAX) capsule 0.4 mg 0.4 mg Oral QHS   Continuous Infusions:  PRN and Respiratory Meds:acetaminophen Q4H PRN, fentaNYL citrate PF Q1H PRN, nitroglycerin Q5 MIN PRN, ondansetron (ZOFRAN) IV Q6H PRN, oxyCODONE Q4H PRN      Diagnostic Tests Hematology: Lab Results   Component Value Date    HGB 14.7 12/04/2016    HCT 43.8 12/04/2016    PLTCT 115 12/04/2016    WBC 6.4 12/04/2016    NEUT 69 01/18/2016    ANC 7.30 01/18/2016    ALC 2.40 01/18/2016    MONA 7 01/18/2016    AMC 0.70 01/18/2016    EOSA 2 01/18/2016    ABC 0.00 01/18/2016    MCV 96.5 12/04/2016    MCH 32.3 12/04/2016    MCHC 33.5 12/04/2016    MPV 12.1 12/04/2016    RDW 14.5 12/04/2016         General Chemistry: Lab Results   Component Value Date    NA 140 11/20/2016    K 3.7 11/20/2016    CL 105 11/20/2016    CO2 28 11/20/2016    GAP 7 11/20/2016    BUN 14 11/20/2016    CR 0.99 11/20/2016    GLU 111 11/20/2016    CA 9.4 11/20/2016    ALBUMIN 4.1 07/16/2016    MG 2.0 04/01/2016    TOTBILI 0.9 07/16/2016    PO4 2.9 01/18/2016      Coagulation:   Lab Results   Component Value Date    PTT 46.7 01/18/2016    INR 1.1 01/18/2016  Follow-Up Assessment  Patient location during evaluation: floor      Anesthetic Complications:   Anesthetic complications: The patient did not experience any anesthestic complications.      Pain:  Score: 0    Management:adequate     Level of Consciousness: awake and alert   Hydration:acceptable     Airway Patency: patent   Respiratory Status: acceptable and room air     Cardiovascular Status:acceptable and hemodynamically stable   Regional/Neuroaxial:

## 2016-12-05 NOTE — Progress Notes
PHYSICAL THERAPY  ASSESSMENT     MOBILITY:  Mobility  Progressive Mobility Level: Walk in hallway  Distance Walked (feet): 100 ft  Level of Assistance: Assist X1  Assistive Device: None  Time Tolerated: 11-30 minutes  Activity Limited By: Fatigue;Weakness    SUBJECTIVE:  Subjective  Significant hospital events: 76 y/o M with R foot drop, s/p L4-5 re-do laminectomy and lateral recess decompression  Mental / Cognitive Status: Alert;Oriented;Cooperative;Follows Commands  Persons Present: Occupational Therapist;Family  Pain: Patient has no complaint of pain  Comments: Pt off bed rest per RN and cleared for out of bed activity prior to session.   Ambulation Assist: Independent Mobility in [employer] without Device  Patient Owned Equipment: Single [employer] Situation: Lives with Family  Type of Home: House  Entry Stairs: No Stairs  In-Home Stairs: 1-2 Flights of Stairs;Rail on 1 Side  Comments: Pt lives at home with wife in [place] home (bedroom on 2nd floor; pt previously independent with ADLs and mobility without assistive devices. Pt was wearing AFO for R foot drop.     STRENGTH:  Strength  Overall Strength: Generalized Weakness  Strength Unable To Assess: Due to Pain/Surgery    POSTURE/NEURO:  Posture / Neurological  RLE Sensation/Proprioception: Impaired Light Touch (Diminished compared to LLE)    BED MOBILITY/TRANSFERS:  Bed Mobility/Transfers  Bed Mobility: Supine to Sit: Standby Assist;Requires Extra Time;Safety Considerations  Comments: Provided education to pt on log roll technique  Transfer Type: Sit to Stand  Transfer: Assistance Level: Moderate Assist;From;Bed  Transfer: Assistive Device: None  Transfers: Type Of Assistance: For Strength Deficit;For Safety Considerations;Requires Extra Time  Other Transfer Type: Stand to Sit  Other Transfer: Assistance Level: Minimal Assist;To;Bed Side Chair  Other Transfer: Assistive Device: None  Other Transfer: Type Of Assistance: For Strength Deficit;For Safety Considerations;Requires Extra Time  End Of Activity Status: Up in Chair;Nursing Notified;Instructed Patient to Request Assist with Mobility;Instructed Patient to Use Call Light (Chair alarm activated)  Comments: Cues for log roll. Pt denies dizziness, headache, lightheadedness during activity.    BALANCE:  Balance  Sitting Balance: Independent  Standing Balance: Static Standing Balance;Dynamic Standing Balance;Minimal Assist    GAIT:  Gait  Gait Distance: 100 feet  Gait: Assistance Level: Minimal Assist;Moderate Assist  Gait: Assistive Device: None  Gait: Descriptors: Decreased heel strike LLE;Decreased heel strike RLE;Decreased step length;Variable step length;Pace: Slow;Swing-Through Gait  Comments: Pt ambulates with slightly decreased toe clearance on RLE, although pt reports this is better than is typical. Pt demonstrates decreased step length bilaterally, requiring moderate assist for steadying at beginning of ambulation progressing to minimal assist.  Activity Limited By: Complaint of Fatigue;Weakness    ACTIVITY/EXERCISE:  Activity / Exercise  Sit Edge Of Bed: 5 minutes  Sit Edge Of Bed Assist: Independent    EDUCATION:  Education  Persons Educated: Patient  Patient Barriers To Learning: None Noted  Teaching Methods: Verbal Instruction;Demonstration  Patient Response: Verbalized Understanding;Return Demonstration  Topics: Plan/Goals of PT Interventions;Safety Awareness;Up with Assist Only;Importance of Increasing Activity;Ambulate With Nursing;Recommend Continued Therapy;Therapy Schedule    ASSESSMENT/PROGRESS:  Assessment/Progress  Impaired Mobility Due To: Post Surgical Changes;Decreased Strength;Decreased Activity Tolerance  Impaired Strength Due To: Post Surgical Changes;Decreased Activity Tolerance  Assessment/Progress: Should Improve w/ Continued PT  AM-PAC 6 Clicks Basic Mobility Inpatient  Turning from your back to your side while in a flat bed without using bed rails: None Moving from lying on your back to sitting on the side of a flatbed without using bedrails : A  Little  Moving to and from a bed to a chair (including a wheelchair): A Little  Standing up from a chair using your arms (e.g. wheelchair, or bedside chair): A Lot  To walk in hospital room: A Little  Climbing 3-5 steps with a railing: A Lot  Raw Score: 17  Standardized (T-scale) Score: 39.67  Basic Mobility CMS 0-100%: 43.83  CMS G Code Modifier for Basic Mobility: CK    GOALS:  Goals  Goal Formulation: With Patient  Time For Goal Achievement: 2 days, To, 3 days  Pt Will Go Supine To/From Sit: Independently  Pt Will Transfer Bed/Chair: Independently  Pt Will Transfer Sit to Stand: w/ Stand By Assist  Pt Will Ambulate: 151-200 Feet, w/ Stand By Assist  Pt Will Go Up / Down Stairs: 1-2 Flights, w/ Stand By Assist    PLAN:  Plan   Treatment Interventions: Mobility Training;Strengthening;Balance Activities  Plan Frequency: 5-7 Days per Week  PT Plan for Next Visit: Progress independence with transfers, progress gait, practice stairs as able    RECOMMENDATIONS:  PT Discharge Recommendations  PT Discharge Recommendations: Home with Assistance;versus;Inpatient Setting  Equipment Recommendations: Patient owns necessary equipment  Recommend ongoing assistance for: In and out of house;Transfers;Bed mobility;Ambulation;Stairs;Safety concerns     Anticipate patient will progress well and be safe for home discharge with [facility] PT. Currently limited by: weakness, fatigue. Will continue to assess and update recommendations.     Therapist: Berenice Bouton, PT, DPT  Date: 12/05/2016

## 2016-12-06 MED ORDER — LOSARTAN 50 MG PO TAB
50 mg | Freq: Every day | ORAL | 0 refills | Status: DC
Start: 2016-12-06 — End: 2016-12-07
  Administered 2016-12-07: 14:00:00 50 mg via ORAL

## 2016-12-06 NOTE — Progress Notes
PHYSICAL THERAPY  PROGRESS NOTE       MOBILITY:  Mobility  Progressive Mobility Level: Walk in hallway  Distance Walked (feet): 200 ft  Level of Assistance: Assist X1  Assistive Device: Walker;None  Time Tolerated: 11-30 minutes  Activity Limited By: Pain;Weakness    SUBJECTIVE:  Subjective  Significant hospital events: 76 y/o M with R foot drop, s/p L4-5 re-do laminectomy and lateral recess decompression  Mental / Cognitive Status: Alert;Oriented;Cooperative;Follows Commands  Persons Present: Family  Pain: Patient has no complaint of pain  Ambulation Assist: Independent Mobility in [employer] without Device  Patient Owned Equipment: Single Journalist, newspaper  Home Situation: Lives with Family  Type of Home: House  Entry Stairs: No Stairs  In-Home Stairs: 1-2 Flights of Stairs;Rail on 1 Side  Comments: Pt lives at home with wife in [place] home (bedroom on 2nd floor; pt previously independent with ADLs and mobility without assistive devices. Pt was wearing AFO for R foot drop.     STRENGTH:  Strength  Overall Strength: Generalized Weakness    POSTURE/NEURO:  Posture / Neurological  RLE Sensation/Proprioception: Impaired Light Touch (Diminished compared to LLE)    BED MOBILITY/TRANSFERS:  Bed Mobility/Transfers  Bed Mobility: Supine to Sit: Minimal Assist;Requires Extra Time;Use of Rail;Assist with Trunk  Comments: Reminded pt of log roll technique, pt requires minimal assistance with trunk from flat bed  Transfer Type: Sit to/from Stand  Transfer: Assistance Level: Standby Assist;To/From;Bed;Toilet;Minimal Assist (Contact guard assist from lower seat height)  Transfer: Assistive Device: None (Grab bars at toilet)  Transfers: Type Of Assistance: Elevated Bed;For Strength Deficit;For Safety Considerations  End Of Activity Status: Up in Chair;Nursing Notified;Instructed Patient to Request Assist with Mobility;Instructed Patient to Use Call Light (Chair alarm activated)    BALANCE:  Balance Sitting Balance: Independent  Standing Balance: Static Standing Balance;Dynamic Standing Balance;Standby Assist    GAIT:  Gait  Gait Distance: 200 feet  Gait: Assistance Level: Minimal Assist  Gait: Assistive Device: None;Roller Walker (CGA with roller walker, nearing standby assist)  Gait: Descriptors: Decreased heel strike LLE;Decreased heel strike RLE;Decreased step length;Variable step length;Pace: Slow;Swing-Through Gait  Comments: Pt ambulates with slightly decreased toe clearance on RLE, although pt reports this is better than is typical. Pt demonstrates decreased step length bilaterally. Pt requires minimal to moderate assist for steadying without assistive device, reaching for furniture and railing in hallway. Pt demonstrates significantly more stability with roller walker, requiring contact guard, nearing standby, assist.  Activity Limited By: Complaint of Fatigue;Weakness  Comments: Pt reports increase pain and need to return to room to have bowel movement at end of session. Stairs deferred at this time.    ACTIVITY/EXERCISE:  Activity / Exercise  Sit Edge Of Bed: 10 minutes  Sit Edge Of Bed Assist: Independent    EDUCATION:  Education  Persons Educated: Patient/Family  Patient Barriers To Learning: None Noted  Teaching Methods: Verbal Instruction;Demonstration  Patient Response: Verbalized Understanding;Return Demonstration  Topics: Plan/Goals of PT Interventions;Safety Awareness;Up with Assist Only;Importance of Increasing Activity;Ambulate With Nursing;Recommend Continued Therapy;Therapy Schedule  Comments: Provided education to patient and family on avoiding low seats, log roll technique when in/out of bed, importance of using walker at this time due to weakness and instability following surgery.    ASSESSMENT/PROGRESS:  Assessment/Progress  Impaired Mobility Due To: Post Surgical Changes;Decreased Strength;Decreased Activity Tolerance Impaired Strength Due To: Post Surgical Changes;Decreased Activity Tolerance  Assessment/Progress: Should Improve w/ Continued PT  AM-PAC 6 Clicks Basic Mobility Inpatient  Turning from your back to  your side while in a flat bed without using bed rails: None  Moving from lying on your back to sitting on the side of a flatbed without using bedrails : A Little  Moving to and from a bed to a chair (including a wheelchair): A Little  Standing up from a chair using your arms (e.g. wheelchair, or bedside chair): A Little  To walk in hospital room: A Little  Climbing 3-5 steps with a railing: A Lot  Raw Score: 18  Standardized (T-scale) Score: 41.05  Basic Mobility CMS 0-100%: 40.47  CMS G Code Modifier for Basic Mobility: CK    GOALS:  Goals  Goal Formulation: With Patient  Time For Goal Achievement: 2 days, To, 3 days  Pt Will Go Supine To/From Sit: Independently  Pt Will Transfer Bed/Chair: Independently  Pt Will Transfer Sit to Stand: w/ Stand By Assist  Pt Will Ambulate: 151-200 Feet, w/ Stand By Assist  Pt Will Go Up / Down Stairs: 1-2 Flights, w/ Stand By Assist    PLAN:  Plan   Treatment Interventions: Mobility Training;Strengthening;Balance Activities  Plan Frequency: 5-7 Days per Week  PT Plan for Next Visit: Progress independence with transfers, progress gait, practice stairs as able    RECOMMENDATIONS:  PT Discharge Recommendations  PT Discharge Recommendations: Home with Assistance;versus;Inpatient Setting  Equipment Recommendations: Patient owns necessary equipment  Recommend ongoing assistance for: In and out of house;Transfers;Bed mobility;Ambulation;Stairs;Safety concerns     Pt would benefit from inpatient setting at this time, however pt is very clear about wanting to discharge home with assistance. Pt and family report that pt will have consistent assistance available. Pt also reports that he will be able to stay on one level (family bringing a bed to the main level) so he will not have to do stairs immediately upon return home. Should pt discharge home, pt would benefit from [facility] PT for continued mobility training.    Therapist: Berenice Bouton, PT, DPT  Date: 12/06/2016

## 2016-12-06 NOTE — Progress Notes
Neurosurgery Progress Note      Admission Date: 12/04/2016 LOS: 1 day  ______________________________________________________________    S: No acute events noted. Doing well this AM.  Reports feeling sore but overall pain under control.         O:                  Vital Signs: 24 Hour Range   BP: (106-138)/(53-69)   Temp:  [36.6 C (97.8 F)-37.4 C (99.4 F)]   Pulse:  [58-69]   Respirations:  [16 PER MINUTE-18 PER MINUTE]   SpO2:  [94 %-98 %]   O2 Delivery: None (Room Air)     Physical Exam:    Awake and alert  Oriented appropriate conversation  MAE to command  SILT  RLE DF 2/5, strength otherwise grossly intact  Incision c/d/i - dressing removed    A/P: 76 y.o. male   Active Problems:    Atrial fibrillation (HCC)    Foot drop    HTN (hypertension)    CAD (coronary artery disease)    HLD (hyperlipidemia)    OSA (obstructive sleep apnea)    Spinal stenosis at L4-L5 level      Continue floor care  Regular diet  PT/OT  PRN pain control  PT/OT - recommending rehab - consulted   Discharge planning ongoing    Prophylaxis:   B) Lines:  No  C) Urinary Catheter:  No  D) Antibiotic Usage:  No  E) VTE:  Mechanical prophylaxis; Sequential compression devicesubQ  F) Restraints: Patient assessed for need for restraints.     Please call [phone] with any questions.    Bernerd Limbo, MD  Pager [phone]

## 2016-12-06 NOTE — Consults
Physical Medicine & Rehabilitation Consult Note       Date of Service:  12/06/2016  Joel Hernandez is a 76 y.o. male.     DOB: 1940/08/08                  MRN#:  [id number]  Primary Insurance: MEDICARE  Secondary Insurance: [facility]  Tertiary Insurance:   Financial Class:  Medicare  Date of Admission:  12/04/2016  Referring Physician:  Theotis Barrio, MD  Reason for Consult: evaluate for Post-Acute Rehab/Placement  Precautions: Fall  Weight bearing Precautions:  WBAT    Active Problems  Right L5 lumbar radiculopathy status post decompression  Hypertension  Postoperative nausea  Postoperative pain  Hyperlipidemia  Impaired mobility and ADLs  Gait abnormality  Right foot drop    Assessment & Plan       Joel Hernandez is a 76 y.o. male admitted to [facility] on 12/04/2016 with the following issues:    Right L5 Lumbar Radiculopathy s/p decompression    Impairments: weakness  Activity Limitations:  ambulation and stairs    Post-acute care rehabilitation needs:   Patient has appropriate goals and likely medical complexity for admission to acute inpatient rehabilitation.  However, patient is adamant that he does not want to discharge to a rehabilitation facility and feels that his family will be able to accommodate his needs home.  Family reporting that they are able to move patient's bed to a level of the home in which she will require no stairs.  Family reporting that they live in [place] and will be able to set up patient with outpatient physical therapy and occupational therapy.  Given patient's lack of motivation for inpatient setting and noted motivation for going home and proceeding with outpatient therapeutic intervention, patient were able to demonstrate functional progress and family were agreeable to discharge plan he may be safe discharge for home.    PT, OT consulted to address deficits as below     Impaired gait/mobility: PTA pt was independent at community level without assistive device  Currently requiring minA  Will benefit from continued work with PT to address mobility deficits     Impaired ADL:  PTA pt was independent  Currently requiring modA  Will benefit from ongoing OT to address functional deficits     Thank you for this consultation.  Please call our consult pager with questions or concerns.     Lyda Perone, DO     Rehab Consult Pager:  [phone]    History of Present Illness     76 year old male who presents with planned surgical intervention for right foot drop and foot numbness.  Lumbar MRI shows evidence of lateral recess stenosis at L4 through L5.  He has a history of 2 previous laminectomies in 2004 in 2011.  Patient was taken to the OR by Dr. Clelia Croft on November 8 for right lumbar L4 through L5 redo laminectomy and lateral recess decompression on the right.  Moreover, laminectomy facet ectomy foraminotomy with decompression of the lumbar vertebral segment.    Extensive discussion was had with patient regarding likely recommendation for inpatient setting.  Patient is adamant that he would not like to discharge to inpatient facility and would rather discharge to home.  Patient's wife reports that they are converting the home to be more handicap accessible and she feels confident that they will have the ability to care for her husband at home.  Wife reporting that she is seeing  patient ambulate with both Occupational Therapy and physical therapy and feels that she is able to accommodate his knees currently.  Discussed that current recommendation from therapist was inpatient setting.  Patient again adamant that he does not want to go to inpatient setting instead preferring for home discharge with outpatient therapy.  Reports he will continuously wear his ankle foot orthoses which has prevented him from falling previously.     Past Medical History:   Diagnosis Date   ??? Aortic stenosis    ??? Atrial fibrillation (HCC) ??? Back pain    ??? CAD (coronary artery disease)    ??? Heart murmur 09/15/2013   ??? Hyperlipidemia    ??? Hypertension 09/15/2013   ??? NSTEMI (non-ST elevated myocardial infarction) (HCC) 12/2015    s/p stent placement   ??? OSA on CPAP    ??? SCC (squamous cell carcinoma), arm    ??? Skin cancer    ??? Sleep apnea         Past Surgical History:   Procedure Laterality Date   ??? LUMBAR LAMINECTOMY  2004    L4-5   ??? LUMBAR LAMINECTOMY  2011    Hemilaminectomies L 2-3 L3-4   ??? HEART CATHETERIZATION  01/2016    Stent x 1 LAD placement following NSTEMI   ??? COLONOSCOPY     ??? HX HEART CATHETERIZATION     ??? SKIN CANCER EXCISION  2017, 2018    SCC Arm         Social History     Social History   ??? Marital status: Married     Spouse name: N/A   ??? Number of children: N/A   ??? Years of education: N/A     Occupational History   ??? Not on file.     Social History Main Topics   ??? Smoking status: Former Smoker     Packs/day: 0.50     Years: 15.00     Types: Cigarettes     Quit date: 11/20/1969   ??? Smokeless tobacco: Never Used   ??? Alcohol use No   ??? Drug use: No   ??? Sexual activity: Not on file     Other Topics Concern   ??? Not on file     Social History Narrative   ??? No narrative on file     Family History   Problem Relation Age of Onset   ??? Heart Disease Mother      Family history: Noncontributory     Scheduled Meds:  amiodarone (CORDARONE) tablet 200 mg 200 mg Oral QDAY   atorvastatin (LIPITOR) tablet 40 mg 40 mg Oral QDAY   bisacodyl (DULCOLAX) rectal suppository 10 mg 10 mg Rectal QDAY   docusate (COLACE) capsule 100 mg 100 mg Oral BID   heparin (porcine) PF syringe 5,000 Units 5,000 Units Subcutaneous Q8H   [START ON 12/07/2016] losartan (COZAAR) tablet 50 mg 50 mg Oral QDAY   methocarbamol (ROBAXIN) tablet 500 mg 500 mg Oral QID   metoprolol tartrate (LOPRESSOR) tablet 50 mg 50 mg Oral BID   milk of magnesia (CONC) oral suspension 10 mL 10 mL Oral QDAY   pneumococcal 23-val vaccine (PPSV23) (PNEUMOVAX 23) injection 0.5 mL 0.5 mL Intramuscular ONCE   senna/docusate (SENOKOT-S) tablet 1 tablet 1 tablet Oral BID   tamsulosin (FLOMAX) capsule 0.4 mg 0.4 mg Oral QHS   Continuous Infusions:  PRN and Respiratory Meds:acetaminophen Q4H PRN, fentaNYL citrate PF Q4H PRN, nitroglycerin Q5 MIN PRN, ondansetron (ZOFRAN) IV Q6H PRN,  oxyCODONE Q4H PRN       Allergies   Allergen Reactions   ??? Lisinopril SEE COMMENTS     Reports respiratory distress and back pain       Prior Level of Function     Self-Care/ADLs:  Independent   Mobility: independent at community level w/o assistive device     Home Environment:  Home Situation: Lives with Family (12/06/2016  9:00 AM)  Patient Owned Equipment: Single Point [employer] Walker (12/06/2016  9:00 AM)  Type of Home: House (12/06/2016  9:00 AM)  Entry Stairs: No Stairs (12/06/2016  9:00 AM)  In-Home Stairs: 1-2 Flights of Stairs;Rail on 1 Side (12/06/2016  9:00 AM)  Comments: Pt lives at home with wife in two-story home (bedroom on 2nd floor; pt previously independent with ADLs and mobility without assistive devices. Pt was wearing AFO for R foot drop.  (12/06/2016  9:00 AM)  No Data Recorded      Current Level Of Function:   PT Gait:Gait Distance: 200 feet Gait: Assistance Level: Minimal Assist Gait: Assistive Device: None, Roller Walker (CGA with roller walker, nearing standby assist)  Bed Mobility/Transfers  Bed Mobility: Supine to Sit: Minimal Assist, Requires Extra Time, Use of Rail, Assist with Trunk  Comments: Reminded pt of log roll technique, pt requires minimal assistance with trunk from flat bed  Transfer Type: Sit to/from Stand  Transfer: Assistance Level: Standby Assist, To/From, Bed, Toilet, Minimal Assist (Contact guard assist from lower seat height)  Transfer: Assistive Device: None (Grab bars at toilet)  Transfers: Type Of Assistance: Elevated Bed, For Strength Deficit, For Safety Considerations  Other Transfer Type: Stand to Sit Other Transfer: Assistance Level: Minimal Assist, To, Bed Side Chair  Other Transfer: Assistive Device: None  Other Transfer: Type Of Assistance: For Strength Deficit, For Safety Considerations, Requires Extra Time  End Of Activity Status: Up in Chair, Nursing Notified, Instructed Patient to Request Assist with Mobility, Instructed Patient to Use Call Light (Chair alarm activated)  Comments: Cues for log roll. Pt denies dizziness, headache, lightheadedness during activity.   OT ADL's  Where Assessed: Standing at [employer] of Bed  Grooming Assist: Stand By Assist  Grooming Deficits: Teeth Care  LE Dressing Assist: Minimal Assist  LE Dressing Deficits: Don/Doff R Sock, Don/Doff L Sock  Functional Transfer Assist: Moderate Assist  Functional Transfer Deficits: Steadying, Supervision/Safety  Comment: Supine to sit using log roll tachnique and moderate assist x2. Patient ambulates with minimum assist x2 to minimum assist x1 at end of session. Patient with right side foot drop impacting mobility. Patient hesitant to use an assistive device and states Ill make it when referring to home discharge.   SLP COGNITIVE EVALUATION SUMMARY     PRAGMATICS:    BEHAVIOR:    AUDITORY COMPREHENSION:      ORIENTATION:    AUDITORY ATTENTION/WORKING MEMORY:    AUDITORY MEMORY/SUSTAINED ATTENTION:    NEW LEARNING:    SEQUENCING/ORGANIZATION:    PROBLEM SOLVING:    REASONING:      MATH/MONEY SKILLS:      VISUAL PERCEPTUAL:    SWALLOW EVALUATION SUMMARY                       Review of Systems     A 14 point review of systems was negative except for: that noted in the HPI    Physical Exam     BP: 110/55 (11/10 0833)  Temp: 36.6 ???C (97.8 ???F) (11/10 2956)  Pulse:  60 (11/10 0833)  Respirations: 18 PER MINUTE (11/10 0833)  SpO2: 97 % (11/10 0833)  O2 Delivery: None (Room Air) (11/10 1610)  Body mass index is 29.57 kg/m???.     Gen: awake, alert, NAD  HEENT: NCAT, EOMI, MMM  Neck: Supple and symmetric  Heart: Extremities are well perfused Lungs: respirations even and non-labored  Abdomen: Soft, non-distended  Psych: pleasant mood/appropriate affect  Ext: No c/c/e  MS:     Root Right Left   Elbow Flexion C5     Wrist Extension C6     Elbow Extension C7     Finger Flexion C8     Finger Abduction T1     Hip Flexion L2 4 4   Knee Extension L3 5 5   Dorsiflexion L4 2 5   EHL Extension  L5 3 5   Plantarflexion S1 5 5     Neuro: Oriented to self, location, admission reason, hospital course, preadmission course.  Cranial nerves II through XII grossly intact.      Intake/Output Summary (Last 24 hours) at 12/06/16 1050  Last data filed at 12/06/16 9604   Gross per 24 hour   Intake             1020 ml   Output             1375 ml   Net             -355 ml        Hematology:  Lab Results   Component Value Date    HGB 14.7 12/04/2016    HCT 43.8 12/04/2016    PLTCT 115 12/04/2016    WBC 6.4 12/04/2016    NEUT 69 01/18/2016    ANC 7.30 01/18/2016    ALC 2.40 01/18/2016    MONA 7 01/18/2016    AMC 0.70 01/18/2016    ABC 0.00 01/18/2016    MCV 96.5 12/04/2016    MCHC 33.5 12/04/2016    MPV 12.1 12/04/2016    RDW 14.5 12/04/2016   , Coagulation:    Lab Results   Component Value Date    PTT 46.7 01/18/2016    INR 1.1 01/18/2016    and General Chemistry:  Lab Results   Component Value Date    NA 140 11/20/2016    K 3.7 11/20/2016    CL 105 11/20/2016    GAP 7 11/20/2016    BUN 14 11/20/2016    CR 0.99 11/20/2016    GLU 111 11/20/2016    CA 9.4 11/20/2016    ALBUMIN 4.1 07/16/2016    MG 2.0 04/01/2016    TOTBILI 0.9 07/16/2016        Radiology:  Reviewed     Lyda Perone, DO  Rehab Consult Pager:  [phone]

## 2016-12-06 NOTE — Progress Notes
OCCUPATIONAL THERAPY  PROGRESS NOTE    Patient Name: Joel Hernandez                   Room/Bed: ZO1096/04  Admitting Diagnosis: Foot drop, right [M21.371]  Stenosis of lateral recess of lumbar spine [M48.061]    Past Medical History:   Diagnosis Date   ??? Aortic stenosis    ??? Atrial fibrillation (HCC)    ??? Back pain    ??? CAD (coronary artery disease)    ??? Heart murmur 09/15/2013   ??? Hyperlipidemia    ??? Hypertension 09/15/2013   ??? NSTEMI (non-ST elevated myocardial infarction) (HCC) 12/2015    s/p stent placement   ??? OSA on CPAP    ??? SCC (squamous cell carcinoma), arm    ??? Skin cancer    ??? Sleep apnea        Mobility  Progressive Mobility Level: Walk in hallway  Distance Walked (feet): 150 ft  Level of Assistance: Assist X1  Assistive Device: Walker  Time Tolerated: 11-30 minutes  Activity Limited By: Pain;Weakness    Subjective  Pertinent Dx per Physician: 76 y/o M with R foot drop, s/p L4-5 re-do laminectomy and lateral recess decompression  Precautions: Standard;Falls  Pain / Complaints: Patient agrees to participate in therapy  Comments: Upon arrival patient up to chair. Patient returns to chair following session. All needs met, tabs alarm acitvated and call light within reach.     Objective  Psychosocial Status: Willing and Cooperative to Participate  Persons Present: Family    Home Living  Type of Home: House  Home Layout: Two Level;Bed/Bath Upstairs  Financial risk analyst / Tub: Pension scheme manager: Standard  Comment: Patient reports a full flight of stairs from the main level to the second level. Family report he will stay on main level once home.     Prior Function  Level Of Independence: Independent with ADLs and functional transfers;Independent with homemaking w/ ambulation  Lives With: Spouse  Receives Help From: Spouse  Other Function Comments: Patient uses an AFO on the right foot prior to surgery.     ADL's  Where Assessed: Chair  UE Dressing Assist: Independent UE Dressing Deficits: No Assist Needed  LE Dressing Assist: Stand By Assist  LE Dressing Deficits: Supervision/Safety;Verbal Cueing  Comment: Patient educated on maintaining back precautions with LB dressing, verbalizes/demonstrates understanding with increased time. Patient somewhat impulsive, requires cues to pace self. Discussed adaptive equipment needs including a shower chair in order to maintain back precautions. Family receptive to suggestions and report they will obtain any needed equipment. Patient with SBA to ambulate with roller walker 150'. Tolerates session well.     Activity Tolerance  Endurance: 3/5 Tolerates 25-30 Minutes Exercise w/Multiple Rests  Sitting Balance: 5/5 Moves/Returns Trunkal Midpoint in All Planes > 2 Inches    Cognition  Overall Cognitive Status: WFL to Adequately Complete Self Care Tasks Safely  Comprehension: WFL to Adequately Complete Self Care Tasks Safely  Orientation: Alert & Oriented x4  Attention: Awake/Alert    UE Strength / Tone  Overall Strength / Tone: WFL Able to Perform ADL Tasks    Education  Persons Educated: Patient/Family  Barriers To Learning: None Noted  Teaching Methods: Verbal Instruction  Patient Response: Verbalized Understanding  Topics: Role of OT, Goals for Therapy;Home safety;ADL Compensatory Techniques;Adaptive Devices for ADLs (Back precautions)  Goal Formulation: With Patient/Family    Assessment  Assessment: Decreased ADL Status;Decreased Self-Care Trans;Decreased High-Level ADLs  Prognosis: Good  Goal Formulation: Patient    AM-PAC 6 Clicks Daily Activity Inpatient  Putting on and taking off regular lower body clothes?: A Little  Bathing (Including washing, rinsing, drying): A Little  Toileting, which includes using toilet, bedpan, or urinal: A Little  Putting on and taking off regular upper body clothing: None  Taking care of personal grooming such as brushing teeth: None  Eating meals?: None  Daily Activity Raw Score: 21 Standardized (t-scale) score: 44.27  CMS 0-100% Score: 32.79  CMS G Code Modifier: CJ    Plan  Progress: Progressing Toward Goals  OT Frequency: 5x/week  OT Plan for Next Visit: Standing balance, toileting/toilet transfers    Further Evaluation Goals  Pt Will Tolerate Further ADL Evaluation: Met    ADL Goals  Patient Will Perform All ADL's: w/ Stand By Assist;Met  Patient Will Perform LE Dressing: w/ Stand By Assist;Met  Patient Will Perform Toileting: Not Addressed;w/ Stand By Assist    Functional Transfer Goals  Pt Will Perform All Functional Transfers: w/ Stand By Assist    OT Discharge Recommendations  OT Discharge Recommendations: Home with Home Health, Vs, Home with family assist  Equipment Recommendations: Shower Chair  Recommend ongoing assistance for: In and out of house, Transfers, Bed mobility, Stairs      Therapist: Adonis Brook, OTR/L [id number]   Date: 12/06/2016

## 2016-12-07 ENCOUNTER — Inpatient Hospital Stay: Admit: 2016-12-04 | Discharge: 2016-12-07 | Disposition: A | Discharge status: Home / Self Care | Payer: MEDICARE

## 2016-12-07 ENCOUNTER — Inpatient Hospital Stay: Admit: 2016-12-04 | Discharge: 2016-12-04 | Payer: MEDICARE

## 2016-12-07 ENCOUNTER — Inpatient Hospital Stay: Admit: 2016-11-20 | Discharge: 2016-11-20 | Payer: MEDICARE

## 2016-12-07 DIAGNOSIS — I4891 Unspecified atrial fibrillation: ICD-10-CM

## 2016-12-07 DIAGNOSIS — I252 Old myocardial infarction: ICD-10-CM

## 2016-12-07 DIAGNOSIS — R269 Unspecified abnormalities of gait and mobility: ICD-10-CM

## 2016-12-07 DIAGNOSIS — I10 Essential (primary) hypertension: ICD-10-CM

## 2016-12-07 DIAGNOSIS — E785 Hyperlipidemia, unspecified: ICD-10-CM

## 2016-12-07 DIAGNOSIS — Z87891 Personal history of nicotine dependence: ICD-10-CM

## 2016-12-07 DIAGNOSIS — Z7902 Long term (current) use of antithrombotics/antiplatelets: ICD-10-CM

## 2016-12-07 DIAGNOSIS — Z85828 Personal history of other malignant neoplasm of skin: ICD-10-CM

## 2016-12-07 DIAGNOSIS — M48061 Spinal stenosis, lumbar region without neurogenic claudication: ICD-10-CM

## 2016-12-07 DIAGNOSIS — G960 Cerebrospinal fluid leak: ICD-10-CM

## 2016-12-07 DIAGNOSIS — Z7901 Long term (current) use of anticoagulants: ICD-10-CM

## 2016-12-07 DIAGNOSIS — G9781 Other intraoperative complications of nervous system: ICD-10-CM

## 2016-12-07 DIAGNOSIS — G4733 Obstructive sleep apnea (adult) (pediatric): ICD-10-CM

## 2016-12-07 DIAGNOSIS — I2510 Atherosclerotic heart disease of native coronary artery without angina pectoris: ICD-10-CM

## 2016-12-07 DIAGNOSIS — M21371 Foot drop, right foot: Secondary | ICD-10-CM

## 2016-12-07 DIAGNOSIS — M5416 Radiculopathy, lumbar region: Principal | ICD-10-CM

## 2016-12-07 DIAGNOSIS — Z955 Presence of coronary angioplasty implant and graft: ICD-10-CM

## 2016-12-07 MED ORDER — CLOPIDOGREL 75 MG PO TAB
75 mg | ORAL_TABLET | Freq: Every day | ORAL | 3 refills | 90.00000 days | Status: AC
Start: 2016-12-07 — End: 2017-01-23

## 2016-12-07 MED ORDER — SENNOSIDES-DOCUSATE SODIUM 8.6-50 MG PO TAB
1 | ORAL_TABLET | Freq: Two times a day (BID) | ORAL | 0 refills | Status: AC
Start: 2016-12-07 — End: 2018-05-24

## 2016-12-07 MED ORDER — APIXABAN 5 MG PO TAB
ORAL_TABLET | 3 refills | Status: AC
Start: 2016-12-07 — End: 2017-02-13

## 2016-12-07 MED ORDER — OXYCODONE 5 MG PO TAB
5-10 mg | ORAL_TABLET | ORAL | 0 refills | 6.00000 days | Status: AC | PRN
Start: 2016-12-07 — End: 2016-12-23

## 2016-12-07 NOTE — Progress Notes
Removed peripheral line and reviewed d/c orders with pt. Oxy script sent with pt.  No further questions at this time.

## 2016-12-07 NOTE — Discharge Instructions - Pharmacy
Physician Discharge Summary      Name: Joel Hernandez  Medical Record Number: [id number]        Account Number:  [id number]  Date Of Birth:  [birth year]                         Age:  76 years   Admit date:  12/04/2016                     Discharge date:  12/07/2016    Attending Physician:  Dr. Theotis Barrio               Service: Surgery-Neuro    Physician Summary completed by: Luis Abed, MD    Reason for hospitalization: planned operation     Significant PMH:   Past Medical History:   Diagnosis Date   ??? Aortic stenosis    ??? Atrial fibrillation (HCC)    ??? Back pain    ??? CAD (coronary artery disease)    ??? Heart murmur 09/15/2013   ??? Hyperlipidemia    ??? Hypertension 09/15/2013   ??? NSTEMI (non-ST elevated myocardial infarction) (HCC) 12/2015    s/p stent placement   ??? OSA on CPAP    ??? SCC (squamous cell carcinoma), arm    ??? Skin cancer    ??? Sleep apnea          Allergies: Lisinopril    Admission Physical Exam notable for:   Physical Exam  Constitutional: Well-developed, well nourished  HENT: Normocephalic, atraumatic, Oropharynx clear and moist  Eyes: Pupils equal round and reactive to light, Extraocular muscles normal  Cardiovascular: Heart sounds normal  Pulmonary: Effort normal  Abdominal: No tenderness  Musculoskeletal: ROM normal  Skin: Warm, dry  Neurological: A&amp;Ox3, speech normal  CN: Facial sensation equal, Smile equal, eye closure equal, hearing present bilaterally, palate rises  bilaterally, shoulder shrug equal, tongue protrusion midline  5/5 strength x 4 extremities except 2/5 right foot dorsiflexion, 0/5 EHL  Soft touch present x 4 extremities  Back incisions well healed    Admission Lab/Radiology studies notable for:   MRI of the lumbar spine reviewed which shows multilevel degenerative changes. ???He is got stenosis at L2-3 and L3-4. ???The area of interest is L4-5 given his right foot drop. ???There appears to be right lateral foraminal stenosis and lateral recess stenosis. ???The axial cut is at an odd angle, however the sagittals going laterally show that there is some impingement on the L5 nerve root as it exits.    Brief Hospital Course:   [name] was evaluated in clinic for a right foot drop that started this past July, his imaging showed lateral recess stenosis at L4-5.  He was thus admitted to undergo right L4-5 lateral recess decompression.   Patient tolerated the procedure well without complications, and was admitted to the floor post-operatively.  Patient worked with PT and OT while in house.  Rehab was consulted but the patient and family greatly desired to go home with continual care from family members - rehab was comfortable with this plan.   Patient progressed to a regular diet and had a bowel movement before discharge.  Pain was well controlled and by time of discharge the patient was tolerating a PO pain regimen.  Patient was medically stable for discharge home with out patient therapy on 12/07/2016.    Of note he was instructed to restart plavix on POD 7 and Eliquis on POD 10.  Condition at Discharge: Stable    Discharge Diagnoses:      Hospital Problems        Active Problems    Atrial fibrillation (HCC)    Foot drop    HTN (hypertension)    CAD (coronary artery disease)    HLD (hyperlipidemia)    OSA (obstructive sleep apnea)    Spinal stenosis at L4-L5 level          Surgical Procedures:   12/04/16 - RIGHT LUMBAR 4-5 REDO LAMINECTOMY AND LATERAL RECESS DECOMPRESSION (Right)  LAMINECTOMY/ FACETECTOMY/ FORAMINOTOMY WITH DECOMPRESSION - 1 VERTEBRAL SEGMENT - LUMBAR    Significant Diagnostic Studies and Procedures: none    Consults:  Rehabilitative Medicine    Patient Disposition: Home       Patient instructions/medications:     Activity as Tolerated   It is important to keep increasing your activity level after you leave the hospital.  Moving around can help prevent blood clots, lung infection (pneumonia) and other problems.  Gradually increasing the number of times you are up moving around will help you return to your normal activity level more quickly.  Continue to increase the number of times you are up to the chair and walking daily to return to your normal activity level. Begin to work toward your normal activity level after follow-up appointment     INSTRUCTIONS, ADDITIONAL   Ok to restart plavix 1 week post op and Eliquis 10 days post op     PT EVAL & TREAT     Report These Signs and Symptoms   Please contact your doctor if you have any of the following symptoms: temperature higher than 100 degrees F, uncontrolled pain, persistent nausea and/or vomiting, severe abdominal pain, unable to urinate or unable to have bowel movement.     Questions About Your Stay   For questions or concerns regarding your hospital stay. Call [phone]   Discharging attending physician: Theotis Barrio [[id number]]      Regular Diet   You have no dietary restriction. Please continue with a healthy balanced diet.     Incision Care   May shower 2 days after surgery. You may get incision wet and pat dry.  You may use your regular shampoo.  Keep incision clean. Do NOT submerge in tub, pool, etc for at least 6 weeks after surgery. Do NOT scrub wound.    *Keep your incision clean and dry.    *May shower 2 days following procedure.  Avoid direct water contact to the incision. Take sponge baths, working around the incision during this time.  *Do not submerge incision in tub, pool, hot tub, or lake for 4 weeks.  *Avoid applying deodorants, powders, creams, lotions, etc, to your incision for 4 weeks.  *Usually there are no stitches to be removed.  Steri-strips (strips of tape) will begin to fall off in 10-14 days.  If they remain after 2 weeks, gently remove them when they are damp after a shower.  *Your incision should gradually look better each day.  If you notice unusual swelling, redness, drainage, have increasing pain at the site, or have a fever greater than 100 degrees, notify your physician immediately.     Additional Discharge Instructions   If blood in urine continues - please follow up with PCP for further work up.        Current Discharge Medication List       START taking these medications    Details   oxyCODONE (ROXICODONE, OXY-IR)  5 mg tablet Take one tablet to two tablets by mouth every 4 hours as needed Earliest Fill Date: 12/07/16  Qty: 50 tablet, Refills: 0    PRESCRIPTION TYPE:  Print      senna/docusate (SENOKOT-S) 8.6/50 mg tablet Take one tablet by mouth twice daily.  Qty: 60 tablet, Refills: 0    PRESCRIPTION TYPE:  Normal          CONTINUE these medications which have been CHANGED or REFILLED    Details   apixaban (ELIQUIS) 5 mg tablet Ok to restart on 12/14/16. - 10 days post op  Qty: 180 tablet, Refills: 3    PRESCRIPTION TYPE:  Normal      clopiDOGrel (PLAVIX) 75 mg tablet Take one tablet by mouth daily.  Qty: 90 tablet, Refills: 3    PRESCRIPTION TYPE:  Normal  Comments: Ok to restart 1 week post op 12/11/16          CONTINUE these medications which have NOT CHANGED    Details   amiodarone (CORDARONE) 200 mg tablet Take one tablet by mouth daily. Take with food.  Qty: 60 tablet, Refills: 1    PRESCRIPTION TYPE:  No Print  Comments: Please consider 90 day supplies to promote better adherence      atorvastatin (LIPITOR) 40 mg tablet TAKE 1 TABLET EVERY DAY  Qty: 90 tablet, Refills: 3    PRESCRIPTION TYPE:  Normal      losartan(+) (COZAAR) 100 mg tablet Take 0.5 tablets by mouth daily.  Qty: 30 tablet, Refills: 1    PRESCRIPTION TYPE:  No Print      metoprolol tartrate (LOPRESSOR) 50 mg tablet Take 1 tablet by mouth twice daily.  Qty: 360 tablet, Refills: 3    PRESCRIPTION TYPE:  No Print      nitroglycerin (NITROSTAT) 0.4 mg tablet Place 1 tablet under tongue every 5 minutes as needed for Chest Pain. Max of 3 tablets, call 911.  Qty: 25 tablet, Refills: 3    PRESCRIPTION TYPE:  Normal tamsulosin (FLOMAX) 0.4 mg capsule Take 1 capsule by mouth at bedtime daily.    PRESCRIPTION TYPE:  Historical Med              Scheduled appointments:    Dec 22, 2016  2:15 PM CST  (Arrive by 2:00 PM)  Return Patient with Christen Bame, DO  [facility] Exam [facility] Exam) Medical [facility] [address]  [address]  [place] [address]  [phone]          Pending items needing follow up: none    Signed:  Luis Abed, MD  12/07/2016      cc:  Primary Care Physician:  Lavone Orn   Verified  Referring physicians:  Lavone Orn, DO   Additional provider(s):

## 2016-12-07 NOTE — Progress Notes
Notified Dr. Bernerd Limbo, MD this AM of pt w/bloody discharge from penis along with some clots in urine. Adequate UOP. No complaints of pain in perineal area. No further orders at this time.

## 2016-12-07 NOTE — Progress Notes
Neurosurgery Progress Note      Admission Date: 12/04/2016 LOS: 2 days  ______________________________________________________________    S: No acute events noted. Doing well this AM.  Patient and family requesting DC home - rehab in agreement with out patient therapy.   Blood reported in urine - likely from foley trauma, advised patient to follow up with PCP if continues.         O:                  Vital Signs: 24 Hour Range   BP: (101-135)/(45-73)   Temp:  [36.6 C (97.8 F)-37.4 C (99.4 F)]   Pulse:  [53-68]   Respirations:  [16 PER MINUTE-18 PER MINUTE]   SpO2:  [95 %-99 %]   O2 Delivery: None (Room Air)     Physical Exam:    Awake and alert  Oriented appropriate conversation  MAE to command  SILT  RLE DF 2/5, strength otherwise grossly intact  Incision c/d/i     A/P: 76 y.o. male   Active Problems:    Atrial fibrillation (HCC)    Foot drop    HTN (hypertension)    CAD (coronary artery disease)    HLD (hyperlipidemia)    OSA (obstructive sleep apnea)    Spinal stenosis at L4-L5 level      Regular diet  PT/OT  PRN pain control  PT/OT/rehab - out patient therapy  Discharge home today    Prophylaxis:   B) Lines:  No  C) Urinary Catheter:  No  D) Antibiotic Usage:  No  E) VTE:  Mechanical prophylaxis; Sequential compression devicesubQ  F) Restraints: Patient assessed for need for restraints.     Please call [phone] with any questions.    Bernerd Limbo, MD  Pager [phone]

## 2016-12-08 ENCOUNTER — Encounter: Admit: 2016-12-08 | Discharge: 2016-12-08 | Payer: MEDICARE

## 2016-12-08 DIAGNOSIS — I2510 Atherosclerotic heart disease of native coronary artery without angina pectoris: ICD-10-CM

## 2016-12-08 DIAGNOSIS — I10 Essential (primary) hypertension: Principal | ICD-10-CM

## 2016-12-08 DIAGNOSIS — G4733 Obstructive sleep apnea (adult) (pediatric): ICD-10-CM

## 2016-12-08 DIAGNOSIS — G4730 Sleep apnea, unspecified: ICD-10-CM

## 2016-12-08 DIAGNOSIS — I4891 Unspecified atrial fibrillation: ICD-10-CM

## 2016-12-08 DIAGNOSIS — R011 Cardiac murmur, unspecified: ICD-10-CM

## 2016-12-08 DIAGNOSIS — C4490 Unspecified malignant neoplasm of skin, unspecified: ICD-10-CM

## 2016-12-08 DIAGNOSIS — I350 Nonrheumatic aortic (valve) stenosis: ICD-10-CM

## 2016-12-08 DIAGNOSIS — E785 Hyperlipidemia, unspecified: ICD-10-CM

## 2016-12-08 DIAGNOSIS — M549 Dorsalgia, unspecified: ICD-10-CM

## 2016-12-08 DIAGNOSIS — C44621 Squamous cell carcinoma of skin of unspecified upper limb, including shoulder: ICD-10-CM

## 2016-12-08 DIAGNOSIS — I214 Non-ST elevation (NSTEMI) myocardial infarction: ICD-10-CM

## 2016-12-09 MED ORDER — AMIODARONE 200 MG PO TAB
200 mg | ORAL_TABLET | Freq: Every day | ORAL | 5 refills | 42.00000 days | Status: AC
Start: 2016-12-09 — End: 2017-02-24

## 2016-12-13 ENCOUNTER — Encounter: Admit: 2016-12-13 | Discharge: 2016-12-13 | Payer: MEDICARE

## 2016-12-16 ENCOUNTER — Encounter: Admit: 2016-12-16 | Discharge: 2016-12-16 | Payer: MEDICARE

## 2016-12-16 NOTE — Telephone Encounter
Contacted patient at phone number [phone] to get further information regarding PT referral that was placed by Dr. Manuella Ghazi on 12/07/2016. Patient stated that they haven't received therapy yet and that they would like to have it completed at [facility]. Printed and faxed referral to [facility] at fax number [phone].

## 2016-12-23 ENCOUNTER — Ambulatory Visit: Admit: 2016-12-23 | Discharge: 2016-12-24 | Payer: MEDICARE

## 2016-12-23 ENCOUNTER — Encounter: Admit: 2016-12-23 | Discharge: 2016-12-23 | Payer: MEDICARE

## 2016-12-23 DIAGNOSIS — G4730 Sleep apnea, unspecified: ICD-10-CM

## 2016-12-23 DIAGNOSIS — M549 Dorsalgia, unspecified: ICD-10-CM

## 2016-12-23 DIAGNOSIS — I214 Non-ST elevation (NSTEMI) myocardial infarction: ICD-10-CM

## 2016-12-23 DIAGNOSIS — R011 Cardiac murmur, unspecified: ICD-10-CM

## 2016-12-23 DIAGNOSIS — I350 Nonrheumatic aortic (valve) stenosis: ICD-10-CM

## 2016-12-23 DIAGNOSIS — I4891 Unspecified atrial fibrillation: ICD-10-CM

## 2016-12-23 DIAGNOSIS — I2510 Atherosclerotic heart disease of native coronary artery without angina pectoris: ICD-10-CM

## 2016-12-23 DIAGNOSIS — C44621 Squamous cell carcinoma of skin of unspecified upper limb, including shoulder: ICD-10-CM

## 2016-12-23 DIAGNOSIS — I10 Essential (primary) hypertension: Principal | ICD-10-CM

## 2016-12-23 DIAGNOSIS — M48061 Spinal stenosis, lumbar region without neurogenic claudication: ICD-10-CM

## 2016-12-23 DIAGNOSIS — C4490 Unspecified malignant neoplasm of skin, unspecified: ICD-10-CM

## 2016-12-23 DIAGNOSIS — E785 Hyperlipidemia, unspecified: ICD-10-CM

## 2016-12-23 DIAGNOSIS — G4733 Obstructive sleep apnea (adult) (pediatric): ICD-10-CM

## 2016-12-24 NOTE — Progress Notes
12/23/2016    Patient:             Joel Hernandez  Med Rec #: [id number]  DOB:              12/26/1940    Vitals:    12/23/16 1239   BP: 150/81   Pulse: 56   Temp: 36.5 ???C (97.7 ???F)   TempSrc: Oral       History of Present Illness:  Joel Hernandez is a 76 y.o. male who underwent a right re do laminectomy and lateral recess decompression, facetectomy/foraminotomy with decompression on December 04, 2016. He presents for suture removal with his son.    He presented after a several week history of right foot drop, numbness and radiculopathy and remote history of previous lumbar surgeries.  Since surgery, he is no longer taking pain medication.  He denies constipation or urinary difficulty.  Immediately after surgery he had some difficulty with urination and was prescribed Flomax.  He continues on Flomax at this time. He has continued numbness and foot drop.  His AFO is in place.  He denies radiculopathy.  He is working with Physical Therapy. He is back on Plavix and Eliquis.    Allergies as of 12/23/2016 - Reviewed 12/23/2016   Allergen Reaction Noted   ??? Lisinopril SEE COMMENTS 09/15/2013       ??? amiodarone (CORDARONE) 200 mg tablet Take one tablet by mouth daily.   ??? amiodarone (CORDARONE) 200 mg tablet Take one tablet by mouth daily. Take with food.   ??? apixaban (ELIQUIS) 5 mg tablet Ok to restart on 12/14/16. - 10 days post op   ??? atorvastatin (LIPITOR) 40 mg tablet TAKE 1 TABLET EVERY DAY (Patient taking differently: TAKE 1 TABLET BY MOUTH EVERY DAY)   ??? clopiDOGrel (PLAVIX) 75 mg tablet Take one tablet by mouth daily.   ??? losartan(+) (COZAAR) 100 mg tablet Take 0.5 tablets by mouth daily.   ??? metoprolol tartrate (LOPRESSOR) 50 mg tablet Take 1 tablet by mouth twice daily.   ??? nitroglycerin (NITROSTAT) 0.4 mg tablet Place 1 tablet under tongue every 5 minutes as needed for Chest Pain. Max of 3 tablets, call 911.   ??? senna/docusate (SENOKOT-S) 8.6/50 mg tablet Take one tablet by mouth twice daily. ??? tamsulosin (FLOMAX) 0.4 mg capsule Take 1 capsule by mouth at bedtime daily.        Past Medical History:   Diagnosis Date   ??? Aortic stenosis    ??? Atrial fibrillation (HCC)    ??? Back pain    ??? CAD (coronary artery disease)    ??? Heart murmur 09/15/2013   ??? Hyperlipidemia    ??? Hypertension 09/15/2013   ??? NSTEMI (non-ST elevated myocardial infarction) (HCC) 12/2015    s/p stent placement   ??? OSA on CPAP    ??? SCC (squamous cell carcinoma), arm    ??? Skin cancer    ??? Sleep apnea         Past Surgical History:   Procedure Laterality Date   ??? LUMBAR LAMINECTOMY  2004    L4-5   ??? LUMBAR LAMINECTOMY  2011    Hemilaminectomies L 2-3 L3-4   ??? HEART CATHETERIZATION  01/2016    Stent x 1 LAD placement following NSTEMI   ??? LUMBAR LAMINECTOMY Right 12/04/2016    RIGHT LUMBAR 4-5 REDO LAMINECTOMY AND LATERAL RECESS DECOMPRESSION performed by Theotis Barrio, MD at [facility] OR/Periop   ??? SPINAL CORD DECOMPRESSION  12/04/2016  LAMINECTOMY/ FACETECTOMY/ FORAMINOTOMY WITH DECOMPRESSION - 1 VERTEBRAL SEGMENT - LUMBAR performed by Theotis Barrio, MD at [facility] OR/Periop   ??? COLONOSCOPY     ??? HX HEART CATHETERIZATION     ??? SKIN CANCER EXCISION  2017, 2018    SCC Arm         No flowsheet data found.    Incision:Lumbar incision is healing well with some scabbing present.  No evidence of underlying fluid or drainage. No evidence of wound separation.      Physical Exam:  He is awake and alert.  He is conversant.His EOM intact.  His motor strength is normal in all of his extremities except his right foot.  His right foot dorsiflexion is 2 out of 5 and his extensor hallucis longus is  0 out of 5. His gross touch is unchanged.  He is wearing an AFO on his right.  He ambulates independently.    Assessment and Plan:  1. Stenosis of lateral recess of lumbar spine     2. Spinal stenosis at L4-L5 level         Krishawn Vanderweele is doing well after surgery. His sutures were removed while in clinic without incident.  He was given wound care instructions.  He should refrain from immersion of his incision.  He will continue with his weight restriction at this time.      All of his questions were answered.  He will be following up in a few weeks in this clinic.          Kerrin Mo MD  phone. [phone]  fax. [phone]    Dictated but not read.

## 2016-12-29 ENCOUNTER — Encounter: Admit: 2016-12-29 | Discharge: 2016-12-29 | Payer: MEDICARE

## 2016-12-29 DIAGNOSIS — I480 Paroxysmal atrial fibrillation: Principal | ICD-10-CM

## 2016-12-29 DIAGNOSIS — Z79899 Other long term (current) drug therapy: ICD-10-CM

## 2016-12-29 NOTE — Progress Notes
Labs and/or CXR for amiodarone surveillance due per Amiodarone protocol.  Lab requisitions/orders and letter explaining need for testing mailed to patient or given to patient in clinic.

## 2017-01-02 ENCOUNTER — Ambulatory Visit: Admit: 2017-01-02 | Discharge: 2017-01-03 | Payer: MEDICARE

## 2017-01-02 ENCOUNTER — Encounter: Admit: 2017-01-02 | Discharge: 2017-01-02 | Payer: MEDICARE

## 2017-01-02 DIAGNOSIS — C4490 Unspecified malignant neoplasm of skin, unspecified: ICD-10-CM

## 2017-01-02 DIAGNOSIS — G4733 Obstructive sleep apnea (adult) (pediatric): ICD-10-CM

## 2017-01-02 DIAGNOSIS — I214 Non-ST elevation (NSTEMI) myocardial infarction: ICD-10-CM

## 2017-01-02 DIAGNOSIS — M48061 Spinal stenosis, lumbar region without neurogenic claudication: Principal | ICD-10-CM

## 2017-01-02 DIAGNOSIS — C44621 Squamous cell carcinoma of skin of unspecified upper limb, including shoulder: ICD-10-CM

## 2017-01-02 DIAGNOSIS — I350 Nonrheumatic aortic (valve) stenosis: ICD-10-CM

## 2017-01-02 DIAGNOSIS — M549 Dorsalgia, unspecified: ICD-10-CM

## 2017-01-02 DIAGNOSIS — I2510 Atherosclerotic heart disease of native coronary artery without angina pectoris: ICD-10-CM

## 2017-01-02 DIAGNOSIS — R011 Cardiac murmur, unspecified: ICD-10-CM

## 2017-01-02 DIAGNOSIS — G4730 Sleep apnea, unspecified: ICD-10-CM

## 2017-01-02 DIAGNOSIS — I4891 Unspecified atrial fibrillation: ICD-10-CM

## 2017-01-02 DIAGNOSIS — E785 Hyperlipidemia, unspecified: ICD-10-CM

## 2017-01-02 DIAGNOSIS — I10 Essential (primary) hypertension: Principal | ICD-10-CM

## 2017-01-02 NOTE — Progress Notes
Neurosurgery Clinic Follow-up    Patient Active Problem List    Diagnosis Date Noted   ??? Atrial fibrillation (HCC) 12/04/2016   ??? Foot drop 12/04/2016   ??? HTN (hypertension) 12/04/2016   ??? CAD (coronary artery disease) 12/04/2016   ??? HLD (hyperlipidemia) 12/04/2016   ??? OSA (obstructive sleep apnea) 12/04/2016   ??? Spinal stenosis at L4-L5 level 12/04/2016   ??? Thrombocytopenia (HCC) 12/01/2016   ??? Stenosis of lateral recess of lumbar spine 11/07/2016   ??? Coronary artery disease due to calcified coronary lesion 01/19/2016     Overview Note:     12/2015 - NSTEMI, LAD stent     ??? Paroxysmal atrial fibrillation (HCC) 01/18/2016     Overview Note:     01/2016 - AF noted during hospitalization for unstable angina.  Attempt at DC cardioversion unsuccessful.  DC'ed on amiodarone & Eliquis.    01/2016 - Successful outpatient cardioversion at [facility].  Amiodarone continued    02/2016 - Office follow-up--back in AF, asymptomatic with good rate control.     ??? Hypertension 09/15/2013     Overview Note:     BP medication started in mid-30's     ??? Aortic stenosis 09/15/2013     Overview Note:     09/20/2013:  Echo ([place]) shows moderate AS, mean gradient 20.  Peak gradient 44.1 mmHg.  AVA 2.1 cm2.  EF 72%.  Aorta normal.    12/2015:  Moderate AS by echo     ??? Obstructive sleep apnea 09/15/2013     Overview Note:     Circa 2004 - Diagnosis established, CPAP started (Dr. Mayford Knife, pulmonologist in [facility])             12/04/2016: Redo laminectomy with right L4-5 foraminotomy    Kaelum Kissick returns a month after a redo laminectomy and right L4-5 foraminotomy.  His surgery was complicated by a small CSF leak, but it healed with a day of bedrest.  He has not had any drainage.    The reason we did the surgery, is because he is got a right sided foot drop.  Unfortunately, that is not any better yet.  I still think it is early and he still has a chance of recovery.  He denies any back pain.  He is not taking any pain medications.    Awake, alert  5/5 strength x 4 except right foot drop (1/5)  Soft touch x 4  Incision healing well, scab present    No new imaging    I would like to give it more time to see if his foot drop improves on the right side.  This was his third surgery in his lumbar spine.  We did do a foraminotomy and feel that the nerve was decompressed.  However, he had the foot drop for quite a while prior to him coming to see me.  I am still optimistic that it could improve.  He and his wife are as well.  I still think it is early and we need to give it several more months.  I would like to see him back in 3 months.  No imaging needed at that time.    Please call [phone] with questions or concern    Theotis Barrio, MD

## 2017-01-09 ENCOUNTER — Encounter: Admit: 2017-01-09 | Discharge: 2017-01-09 | Payer: MEDICARE

## 2017-01-09 DIAGNOSIS — Z79899 Other long term (current) drug therapy: ICD-10-CM

## 2017-01-09 DIAGNOSIS — I480 Paroxysmal atrial fibrillation: Principal | ICD-10-CM

## 2017-01-09 LAB — TSH WITH FREE T4 REFLEX: Lab: 1.3 — ABNORMAL HIGH (ref 5–34)

## 2017-01-09 LAB — LIVER FUNCTION PANEL
Lab: 3.7
Lab: 44
Lab: 0.4
Lab: 6.7

## 2017-01-09 NOTE — Progress Notes
Amiodarone Monitoring status as of 01/09/17:     Amiodarone monitoring complete.  Next amiodarone review is due in 180 days.    Most recent lab results  Lab Results   Component Value Date/Time    AST 39 (H) 01/09/2017    ALT 44 01/09/2017    TSH <0.01 (L) 01/09/2017    FREET4R 1.4 07/16/2016 11:27 AM         Procedures  Last chest X-Ray: 01/09/2017

## 2017-01-09 NOTE — Progress Notes
01/09/17 CXR at [facility]. Impression no acute cardiopulmonary process.

## 2017-01-21 ENCOUNTER — Encounter: Admit: 2017-01-21 | Discharge: 2017-01-21 | Payer: MEDICARE

## 2017-01-21 DIAGNOSIS — M549 Dorsalgia, unspecified: ICD-10-CM

## 2017-01-21 DIAGNOSIS — I4891 Unspecified atrial fibrillation: ICD-10-CM

## 2017-01-21 DIAGNOSIS — C4490 Unspecified malignant neoplasm of skin, unspecified: ICD-10-CM

## 2017-01-21 DIAGNOSIS — G4730 Sleep apnea, unspecified: ICD-10-CM

## 2017-01-21 DIAGNOSIS — R011 Cardiac murmur, unspecified: ICD-10-CM

## 2017-01-21 DIAGNOSIS — I214 Non-ST elevation (NSTEMI) myocardial infarction: ICD-10-CM

## 2017-01-21 DIAGNOSIS — D731 Hypersplenism: ICD-10-CM

## 2017-01-21 DIAGNOSIS — C44621 Squamous cell carcinoma of skin of unspecified upper limb, including shoulder: ICD-10-CM

## 2017-01-21 DIAGNOSIS — G4733 Obstructive sleep apnea (adult) (pediatric): ICD-10-CM

## 2017-01-21 DIAGNOSIS — I2510 Atherosclerotic heart disease of native coronary artery without angina pectoris: ICD-10-CM

## 2017-01-21 DIAGNOSIS — I350 Nonrheumatic aortic (valve) stenosis: ICD-10-CM

## 2017-01-21 DIAGNOSIS — D696 Thrombocytopenia, unspecified: Principal | ICD-10-CM

## 2017-01-21 DIAGNOSIS — E785 Hyperlipidemia, unspecified: ICD-10-CM

## 2017-01-21 DIAGNOSIS — I10 Essential (primary) hypertension: Principal | ICD-10-CM

## 2017-01-21 DIAGNOSIS — K760 Fatty (change of) liver, not elsewhere classified: ICD-10-CM

## 2017-01-21 NOTE — Progress Notes
Name: Joel Hernandez          MRN: [id number]      DOB: 09/19/1940      AGE: 76 y.o.   DATE OF SERVICE: 01/21/2017    Subjective:             Reason for Visit:  Heme/Onc Care      Joel Hernandez is a 76 y.o. male who presents to my hematology clinic upon for follow up for his thrombocytopenia.         History of Present Illness  Hematology history:  Diagnosis & Reason for Visit: ???  Thrombocytopenia-surgical clearance.  Patient is scheduled with neurosurgery 12/04/2016 for right lumbar 4-5 re-do laminectomy.  Patient had pre-op labs drawn 11/20/2016 which revealed a platelet count of 91.   He has no previous history of thrombocytopenia.  In speaking with the patient he does not relay any symptoms.    He was referred to [facility] hematology services for further evaluation and surgical clearance.  All records are in [facility].  ???  Physician Info:  ??? Referring Physician: ???Marcelyn Bruins Neurosurgery  [id number]  ??? PCP:??? [facility] from December 03, 2016:  Folic acid was 8.9, B12 was 414.    Ultrasound of the abdomen from December 02, 2016:  Mild splenomegaly.  Mild fatty changes of the liver    Labs from December 04, 2016:  Hemoglobin of 14.7 with a hematocrit of 43.8, white blood cell count of 6.4, platelet count of 115,000.       Review of Systems   Constitutional: Negative for fatigue.   HENT: Positive for congestion.    Eyes: Negative for visual disturbance.   Respiratory: Positive for cough. Negative for shortness of breath.    Cardiovascular: Negative for chest pain.   Gastrointestinal: Negative for abdominal pain.   Genitourinary: Positive for frequency and urgency. Negative for difficulty urinating.   Musculoskeletal: Negative for back pain.   Skin: Negative for rash.   Neurological: Negative for headaches.   Hematological: Negative for adenopathy. Bruises/bleeds easily.   Psychiatric/Behavioral: Negative.          Objective:         ??? amiodarone (CORDARONE) 200 mg tablet Take one tablet by mouth daily. ??? amiodarone (CORDARONE) 200 mg tablet Take one tablet by mouth daily. Take with food.   ??? apixaban (ELIQUIS) 5 mg tablet Ok to restart on 12/14/16. - 10 days post op   ??? atorvastatin (LIPITOR) 40 mg tablet TAKE 1 TABLET EVERY DAY (Patient taking differently: TAKE 1 TABLET BY MOUTH EVERY DAY)   ??? clopiDOGrel (PLAVIX) 75 mg tablet Take one tablet by mouth daily.   ??? losartan(+) (COZAAR) 100 mg tablet Take 0.5 tablets by mouth daily.   ??? metoprolol tartrate (LOPRESSOR) 50 mg tablet Take 1 tablet by mouth twice daily.   ??? nitroglycerin (NITROSTAT) 0.4 mg tablet Place 1 tablet under tongue every 5 minutes as needed for Chest Pain. Max of 3 tablets, call 911.   ??? senna/docusate (SENOKOT-S) 8.6/50 mg tablet Take one tablet by mouth twice daily.   ??? tamsulosin (FLOMAX) 0.4 mg capsule Take 1 capsule by mouth at bedtime daily.     Vitals:    01/21/17 1322 01/21/17 1329   BP: 149/60    Pulse: 68    Resp: 18    Temp: 36.3 ???C (97.4 ???F)    TempSrc: Oral Oral   SpO2: 98%    Weight: 96.5 kg (212 lb  12.8 oz)    Height: 182.9 cm (72)      Body mass index is 28.86 kg/m???.     Pain Score: Zero         Pain Addressed:  N/A    Patient Evaluated for a Clinical Trial: No treatment clinical trial available for this patient.     [place] Cooperative Oncology Group performance status is 0, Fully active, able to carry on all pre-disease performance without restriction.Marland Kitchen     Physical Exam   Constitutional: He is oriented to person, place, and time. He appears well-developed and well-nourished.   HENT:   Head: Normocephalic and atraumatic.   Eyes: Pupils are equal, round, and reactive to light.   Neck: Neck supple.   Cardiovascular: Normal rate and regular rhythm.   Murmur heard.  Pulmonary/Chest: Effort normal and breath sounds normal. He has no wheezes.   Abdominal: Soft. Bowel sounds are normal. He exhibits no mass.   Musculoskeletal: He exhibits no edema.   Lymphadenopathy:     He has no cervical adenopathy. Right axillary: No lateral adenopathy present.        Left axillary: No lateral adenopathy present.       Right: No supraclavicular adenopathy present.        Left: No supraclavicular adenopathy present.   Neurological: He is alert and oriented to person, place, and time. No cranial nerve deficit.   Skin: No rash noted.   Psychiatric: He has a normal mood and affect.             Assessment and Plan:  1.Thrombocytopenia- patient is status post back surgery on December 04, 2016.  His labs from December 04, 2016 were unrevealing except his platelet count was 115,000.  An ultrasound of the abdomen from December 02, 2016 showed mild splenomegaly and mild fatty changes of the liver.  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  I will check a CBC in 3 months  2. Return to clinic- Will have the patient return to clinic in three months.    I answered the patient's questions to his satisfaction.  I spent 40 minutes of this visit in which at least 50% of it was face to face consultation with the patient.

## 2017-01-23 ENCOUNTER — Encounter: Admit: 2017-01-23 | Discharge: 2017-01-23 | Payer: MEDICARE

## 2017-01-23 MED ORDER — ASPIRIN 81 MG PO TBEC
81 mg | ORAL_TABLET | Freq: Every day | ORAL | 3 refills | Status: SS
Start: 2017-01-23 — End: 2018-05-27

## 2017-01-23 NOTE — Telephone Encounter
-----   Message from Betsy Pries, RN sent at 01/19/2017  8:54 AM CST -----      ----- Message -----  From: Betsy Pries, RN  Sent: 01/19/2017  To: Suzanne Boron Nurse [place]        ----- Message -----  From: Michiel Cowboy, MD  Sent: 10/23/2016   2:09 PM  To: Suzanne Boron Nurse [place]    Can you please call him in late December to have him DC Plavix and start aspirin 81 mg/day?  Thanks.

## 2017-01-23 NOTE — Telephone Encounter
Spoke with pt and advised pt of the instructions as below.  Pt acknoweleged understanding

## 2017-02-13 ENCOUNTER — Encounter: Admit: 2017-02-13 | Discharge: 2017-02-13 | Payer: MEDICARE

## 2017-02-13 MED ORDER — APIXABAN 5 MG PO TAB
5 mg | ORAL_TABLET | Freq: Two times a day (BID) | ORAL | 1 refills | Status: AC
Start: 2017-02-13 — End: 2017-05-08

## 2017-02-18 ENCOUNTER — Encounter: Admit: 2017-02-18 | Discharge: 2017-02-18 | Payer: MEDICARE

## 2017-02-18 MED ORDER — NITROGLYCERIN 0.4 MG SL SUBL
.4 mg | ORAL_TABLET | SUBLINGUAL | 3 refills | 9.00000 days | Status: AC | PRN
Start: 2017-02-18 — End: 2018-06-14

## 2017-02-24 ENCOUNTER — Encounter: Admit: 2017-02-24 | Discharge: 2017-02-24 | Payer: MEDICARE

## 2017-02-24 MED ORDER — AMIODARONE 200 MG PO TAB
200 mg | ORAL_TABLET | Freq: Every day | ORAL | 1 refills | 42.00000 days | Status: AC
Start: 2017-02-24 — End: 2017-09-14

## 2017-03-16 ENCOUNTER — Encounter: Admit: 2017-03-16 | Discharge: 2017-03-16 | Payer: MEDICARE

## 2017-03-16 MED ORDER — CLOPIDOGREL 75 MG PO TAB
ORAL_TABLET | Freq: Every day | 3 refills
Start: 2017-03-16 — End: ?

## 2017-03-18 ENCOUNTER — Encounter: Admit: 2017-03-18 | Discharge: 2017-03-18 | Payer: MEDICARE

## 2017-03-18 DIAGNOSIS — I4891 Unspecified atrial fibrillation: ICD-10-CM

## 2017-03-18 DIAGNOSIS — I350 Nonrheumatic aortic (valve) stenosis: Principal | ICD-10-CM

## 2017-03-31 ENCOUNTER — Encounter: Admit: 2017-03-31 | Discharge: 2017-03-31 | Payer: MEDICARE

## 2017-03-31 MED ORDER — LOSARTAN 100 MG PO TAB
50 mg | ORAL_TABLET | Freq: Every day | ORAL | 1 refills | 30.00000 days | Status: AC
Start: 2017-03-31 — End: 2017-04-02

## 2017-04-02 ENCOUNTER — Ambulatory Visit: Admit: 2017-04-02 | Discharge: 2017-04-03 | Payer: MEDICARE

## 2017-04-02 ENCOUNTER — Encounter: Admit: 2017-04-02 | Discharge: 2017-04-02 | Payer: MEDICARE

## 2017-04-02 DIAGNOSIS — I350 Nonrheumatic aortic (valve) stenosis: Principal | ICD-10-CM

## 2017-04-02 MED ORDER — LOSARTAN 50 MG PO TAB
50 mg | ORAL_TABLET | Freq: Every day | ORAL | 3 refills | 90.00000 days | Status: AC
Start: 2017-04-02 — End: 2017-04-03

## 2017-04-03 ENCOUNTER — Encounter: Admit: 2017-04-03 | Discharge: 2017-04-03 | Payer: MEDICARE

## 2017-04-03 ENCOUNTER — Ambulatory Visit: Admit: 2017-04-03 | Discharge: 2017-04-04 | Payer: MEDICARE

## 2017-04-03 DIAGNOSIS — C44621 Squamous cell carcinoma of skin of unspecified upper limb, including shoulder: ICD-10-CM

## 2017-04-03 DIAGNOSIS — I214 Non-ST elevation (NSTEMI) myocardial infarction: ICD-10-CM

## 2017-04-03 DIAGNOSIS — I350 Nonrheumatic aortic (valve) stenosis: ICD-10-CM

## 2017-04-03 DIAGNOSIS — G4733 Obstructive sleep apnea (adult) (pediatric): ICD-10-CM

## 2017-04-03 DIAGNOSIS — M48061 Spinal stenosis, lumbar region without neurogenic claudication: Principal | ICD-10-CM

## 2017-04-03 DIAGNOSIS — G4730 Sleep apnea, unspecified: ICD-10-CM

## 2017-04-03 DIAGNOSIS — E785 Hyperlipidemia, unspecified: ICD-10-CM

## 2017-04-03 DIAGNOSIS — C4490 Unspecified malignant neoplasm of skin, unspecified: ICD-10-CM

## 2017-04-03 DIAGNOSIS — I10 Essential (primary) hypertension: Principal | ICD-10-CM

## 2017-04-03 DIAGNOSIS — I2510 Atherosclerotic heart disease of native coronary artery without angina pectoris: ICD-10-CM

## 2017-04-03 DIAGNOSIS — M549 Dorsalgia, unspecified: ICD-10-CM

## 2017-04-03 DIAGNOSIS — R011 Cardiac murmur, unspecified: ICD-10-CM

## 2017-04-03 DIAGNOSIS — I4891 Unspecified atrial fibrillation: ICD-10-CM

## 2017-04-03 MED ORDER — LOSARTAN 50 MG PO TAB
50 mg | ORAL_TABLET | Freq: Every day | ORAL | 3 refills | 30.00000 days | Status: AC
Start: 2017-04-03 — End: 2018-04-19

## 2017-04-06 ENCOUNTER — Encounter: Admit: 2017-04-06 | Discharge: 2017-04-06 | Payer: MEDICARE

## 2017-04-06 MED ORDER — ATORVASTATIN 40 MG PO TAB
40 mg | ORAL_TABLET | Freq: Every day | ORAL | 1 refills | Status: AC
Start: 2017-04-06 — End: 2017-10-14

## 2017-04-07 ENCOUNTER — Encounter: Admit: 2017-04-07 | Discharge: 2017-04-07 | Payer: MEDICARE

## 2017-04-07 DIAGNOSIS — E782 Mixed hyperlipidemia: Principal | ICD-10-CM

## 2017-04-14 LAB — COMPREHENSIVE METABOLIC PANEL
Lab: 0.8
Lab: 0.8
Lab: 108 — ABNORMAL HIGH (ref 98–107)
Lab: 127
Lab: 13
Lab: 142
Lab: 18
Lab: 25
Lab: 3.4
Lab: 3.8
Lab: 35 — ABNORMAL HIGH (ref 5–34)
Lab: 39
Lab: 6.1 — ABNORMAL LOW (ref 6.2–8.1)
Lab: 9.4
Lab: 92

## 2017-04-14 LAB — LIPID PROFILE: Lab: 109 — ABNORMAL LOW (ref 150–200)

## 2017-04-16 ENCOUNTER — Encounter: Admit: 2017-04-16 | Discharge: 2017-04-16 | Payer: MEDICARE

## 2017-04-16 DIAGNOSIS — E782 Mixed hyperlipidemia: Principal | ICD-10-CM

## 2017-04-22 ENCOUNTER — Encounter: Admit: 2017-04-22 | Discharge: 2017-04-22 | Payer: MEDICARE

## 2017-04-22 DIAGNOSIS — I214 Non-ST elevation (NSTEMI) myocardial infarction: ICD-10-CM

## 2017-04-22 DIAGNOSIS — M549 Dorsalgia, unspecified: ICD-10-CM

## 2017-04-22 DIAGNOSIS — G4730 Sleep apnea, unspecified: ICD-10-CM

## 2017-04-22 DIAGNOSIS — G4733 Obstructive sleep apnea (adult) (pediatric): ICD-10-CM

## 2017-04-22 DIAGNOSIS — Z7901 Long term (current) use of anticoagulants: ICD-10-CM

## 2017-04-22 DIAGNOSIS — Z7982 Long term (current) use of aspirin: ICD-10-CM

## 2017-04-22 DIAGNOSIS — D731 Hypersplenism: ICD-10-CM

## 2017-04-22 DIAGNOSIS — R011 Cardiac murmur, unspecified: ICD-10-CM

## 2017-04-22 DIAGNOSIS — E785 Hyperlipidemia, unspecified: ICD-10-CM

## 2017-04-22 DIAGNOSIS — I10 Essential (primary) hypertension: Principal | ICD-10-CM

## 2017-04-22 DIAGNOSIS — C44621 Squamous cell carcinoma of skin of unspecified upper limb, including shoulder: ICD-10-CM

## 2017-04-22 DIAGNOSIS — D696 Thrombocytopenia, unspecified: Principal | ICD-10-CM

## 2017-04-22 DIAGNOSIS — I2510 Atherosclerotic heart disease of native coronary artery without angina pectoris: ICD-10-CM

## 2017-04-22 DIAGNOSIS — I350 Nonrheumatic aortic (valve) stenosis: ICD-10-CM

## 2017-04-22 DIAGNOSIS — C4490 Unspecified malignant neoplasm of skin, unspecified: ICD-10-CM

## 2017-04-22 DIAGNOSIS — I4891 Unspecified atrial fibrillation: ICD-10-CM

## 2017-04-22 LAB — CBC AND DIFF
Lab: 0 10*3/uL (ref 0–0.20)
Lab: 0.2 10*3/uL (ref 0–0.45)
Lab: 0.7 10*3/uL (ref 0–0.80)
Lab: 1 % (ref 0–2)
Lab: 1.6 10*3/uL (ref 1.0–4.8)
Lab: 106 10*3/uL — ABNORMAL LOW (ref 150–400)
Lab: 11 % (ref 4–12)
Lab: 11 FL — ABNORMAL HIGH (ref 7–11)
Lab: 13 g/dL (ref 13.5–16.5)
Lab: 14 % (ref 11–15)
Lab: 26 % (ref 24–44)
Lab: 3 % (ref 0–5)
Lab: 3.7 10*3/uL (ref 1.8–7.0)
Lab: 31 pg (ref 26–34)
Lab: 33 g/dL (ref 32.0–36.0)
Lab: 4.3 M/UL — ABNORMAL LOW (ref 4.4–5.5)
Lab: 41 % (ref 40–50)
Lab: 59 % (ref 41–77)
Lab: 6.3 10*3/uL (ref 4.5–11.0)

## 2017-04-30 ENCOUNTER — Encounter: Admit: 2017-04-30 | Discharge: 2017-04-30 | Payer: MEDICARE

## 2017-04-30 DIAGNOSIS — G4730 Sleep apnea, unspecified: ICD-10-CM

## 2017-04-30 DIAGNOSIS — I214 Non-ST elevation (NSTEMI) myocardial infarction: ICD-10-CM

## 2017-04-30 DIAGNOSIS — I4891 Unspecified atrial fibrillation: ICD-10-CM

## 2017-04-30 DIAGNOSIS — C44621 Squamous cell carcinoma of skin of unspecified upper limb, including shoulder: ICD-10-CM

## 2017-04-30 DIAGNOSIS — I350 Nonrheumatic aortic (valve) stenosis: ICD-10-CM

## 2017-04-30 DIAGNOSIS — C4490 Unspecified malignant neoplasm of skin, unspecified: ICD-10-CM

## 2017-04-30 DIAGNOSIS — E785 Hyperlipidemia, unspecified: ICD-10-CM

## 2017-04-30 DIAGNOSIS — I10 Essential (primary) hypertension: Principal | ICD-10-CM

## 2017-04-30 DIAGNOSIS — I2510 Atherosclerotic heart disease of native coronary artery without angina pectoris: ICD-10-CM

## 2017-04-30 DIAGNOSIS — M549 Dorsalgia, unspecified: ICD-10-CM

## 2017-04-30 DIAGNOSIS — G4733 Obstructive sleep apnea (adult) (pediatric): ICD-10-CM

## 2017-04-30 DIAGNOSIS — R011 Cardiac murmur, unspecified: ICD-10-CM

## 2017-05-08 ENCOUNTER — Encounter: Admit: 2017-05-08 | Discharge: 2017-05-08 | Payer: MEDICARE

## 2017-05-08 MED ORDER — APIXABAN 5 MG PO TAB
5 mg | ORAL_TABLET | Freq: Two times a day (BID) | ORAL | 1 refills | Status: AC
Start: 2017-05-08 — End: 2018-03-18

## 2017-05-29 NOTE — Progress Notes
Called and discussed PFTs with patient.  He does not remember ever having them completed. No records of results.  Faxed order to [facility] scheduling to set up appointment for testing.  Pt is agreeable to plan.

## 2017-06-04 ENCOUNTER — Encounter: Admit: 2017-06-04 | Discharge: 2017-06-04 | Payer: MEDICARE

## 2017-06-04 ENCOUNTER — Ambulatory Visit: Admit: 2017-06-04 | Discharge: 2017-06-05 | Payer: MEDICARE

## 2017-06-04 DIAGNOSIS — I350 Nonrheumatic aortic (valve) stenosis: ICD-10-CM

## 2017-06-04 DIAGNOSIS — I10 Essential (primary) hypertension: ICD-10-CM

## 2017-06-04 DIAGNOSIS — M549 Dorsalgia, unspecified: ICD-10-CM

## 2017-06-04 DIAGNOSIS — E785 Hyperlipidemia, unspecified: ICD-10-CM

## 2017-06-04 DIAGNOSIS — I2510 Atherosclerotic heart disease of native coronary artery without angina pectoris: ICD-10-CM

## 2017-06-04 DIAGNOSIS — E782 Mixed hyperlipidemia: ICD-10-CM

## 2017-06-04 DIAGNOSIS — G4730 Sleep apnea, unspecified: ICD-10-CM

## 2017-06-04 DIAGNOSIS — I4891 Unspecified atrial fibrillation: ICD-10-CM

## 2017-06-04 DIAGNOSIS — C44621 Squamous cell carcinoma of skin of unspecified upper limb, including shoulder: ICD-10-CM

## 2017-06-04 DIAGNOSIS — R011 Cardiac murmur, unspecified: ICD-10-CM

## 2017-06-04 DIAGNOSIS — C4490 Unspecified malignant neoplasm of skin, unspecified: ICD-10-CM

## 2017-06-04 DIAGNOSIS — I480 Paroxysmal atrial fibrillation: ICD-10-CM

## 2017-06-04 DIAGNOSIS — G4733 Obstructive sleep apnea (adult) (pediatric): ICD-10-CM

## 2017-06-04 DIAGNOSIS — I214 Non-ST elevation (NSTEMI) myocardial infarction: ICD-10-CM

## 2017-06-11 ENCOUNTER — Encounter: Admit: 2017-06-11 | Discharge: 2017-06-11 | Payer: MEDICARE

## 2017-07-15 ENCOUNTER — Encounter: Admit: 2017-07-15 | Discharge: 2017-07-15 | Payer: MEDICARE

## 2017-07-15 DIAGNOSIS — M549 Dorsalgia, unspecified: ICD-10-CM

## 2017-07-15 DIAGNOSIS — C44621 Squamous cell carcinoma of skin of unspecified upper limb, including shoulder: ICD-10-CM

## 2017-07-15 DIAGNOSIS — D731 Hypersplenism: ICD-10-CM

## 2017-07-15 DIAGNOSIS — Z7982 Long term (current) use of aspirin: ICD-10-CM

## 2017-07-15 DIAGNOSIS — D696 Thrombocytopenia, unspecified: Principal | ICD-10-CM

## 2017-07-15 DIAGNOSIS — I350 Nonrheumatic aortic (valve) stenosis: ICD-10-CM

## 2017-07-15 DIAGNOSIS — G4730 Sleep apnea, unspecified: ICD-10-CM

## 2017-07-15 DIAGNOSIS — C4490 Unspecified malignant neoplasm of skin, unspecified: ICD-10-CM

## 2017-07-15 DIAGNOSIS — I4891 Unspecified atrial fibrillation: ICD-10-CM

## 2017-07-15 DIAGNOSIS — I10 Essential (primary) hypertension: Principal | ICD-10-CM

## 2017-07-15 DIAGNOSIS — I214 Non-ST elevation (NSTEMI) myocardial infarction: ICD-10-CM

## 2017-07-15 DIAGNOSIS — E785 Hyperlipidemia, unspecified: ICD-10-CM

## 2017-07-15 DIAGNOSIS — R011 Cardiac murmur, unspecified: ICD-10-CM

## 2017-07-15 DIAGNOSIS — I2510 Atherosclerotic heart disease of native coronary artery without angina pectoris: ICD-10-CM

## 2017-07-15 DIAGNOSIS — G4733 Obstructive sleep apnea (adult) (pediatric): ICD-10-CM

## 2017-07-16 LAB — CBC AND DIFF
Lab: 11 FL — ABNORMAL HIGH (ref 7–11)
Lab: 112 K/UL — ABNORMAL LOW (ref 150–400)
Lab: 14 % — ABNORMAL LOW (ref 11–15)
Lab: 14 g/dL (ref 13.5–16.5)
Lab: 20 % — ABNORMAL LOW (ref 24–44)
Lab: 31 pg (ref 26–34)
Lab: 33 g/dL — ABNORMAL HIGH (ref 32.0–36.0)
Lab: 4.6 M/UL (ref 4.4–5.5)
Lab: 44 % (ref 40–50)
Lab: 6.5 K/UL — ABNORMAL HIGH (ref 4.5–11.0)
Lab: 68 % (ref 41–77)
Lab: 8 % (ref >60)
Lab: 95 FL (ref 80–100)

## 2017-08-03 ENCOUNTER — Encounter: Admit: 2017-08-03 | Discharge: 2017-08-03 | Payer: MEDICARE

## 2017-08-03 DIAGNOSIS — I480 Paroxysmal atrial fibrillation: Principal | ICD-10-CM

## 2017-08-03 DIAGNOSIS — Z79899 Other long term (current) drug therapy: ICD-10-CM

## 2017-08-10 ENCOUNTER — Encounter: Admit: 2017-08-10 | Discharge: 2017-08-10 | Payer: MEDICARE

## 2017-08-17 ENCOUNTER — Encounter: Admit: 2017-08-17 | Discharge: 2017-08-17 | Payer: MEDICARE

## 2017-08-17 DIAGNOSIS — I480 Paroxysmal atrial fibrillation: Principal | ICD-10-CM

## 2017-08-17 DIAGNOSIS — Z79899 Other long term (current) drug therapy: ICD-10-CM

## 2017-08-17 LAB — TSH WITH FREE T4 REFLEX

## 2017-08-17 LAB — LIVER FUNCTION PANEL
Lab: 0.4
Lab: 0.9
Lab: 39 — ABNORMAL HIGH (ref 5–34)
Lab: 139 /HPF (ref 0–3)
Lab: 4.1 /HPF (ref 0–2)
Lab: 49
Lab: 6.9

## 2017-09-14 ENCOUNTER — Encounter: Admit: 2017-09-14 | Discharge: 2017-09-14 | Payer: MEDICARE

## 2017-09-14 MED ORDER — AMIODARONE 200 MG PO TAB
200 mg | ORAL_TABLET | Freq: Every day | ORAL | 1 refills | 42.00000 days | Status: AC
Start: 2017-09-14 — End: 2018-03-18

## 2017-10-14 ENCOUNTER — Encounter: Admit: 2017-10-14 | Discharge: 2017-10-14 | Payer: MEDICARE

## 2017-10-14 MED ORDER — ATORVASTATIN 40 MG PO TAB
40 mg | ORAL_TABLET | Freq: Every day | ORAL | 1 refills | Status: AC
Start: 2017-10-14 — End: 2018-04-05

## 2017-12-03 LAB — COMPREHENSIVE METABOLIC PANEL
Lab: 0.6
Lab: 0.8
Lab: 12
Lab: 27
Lab: 29
Lab: 29
Lab: 3.7
Lab: 6.3
Lab: 9.5
Lab: 92

## 2017-12-03 LAB — FREE T4 (FREE THYROXINE) ONLY: Lab: 1.1

## 2018-01-12 LAB — CBC AND DIFF

## 2018-01-13 ENCOUNTER — Encounter: Admit: 2018-01-13 | Discharge: 2018-01-13 | Payer: MEDICARE

## 2018-01-13 DIAGNOSIS — G4733 Obstructive sleep apnea (adult) (pediatric): ICD-10-CM

## 2018-01-13 DIAGNOSIS — I10 Essential (primary) hypertension: Principal | ICD-10-CM

## 2018-01-13 DIAGNOSIS — D731 Hypersplenism: Principal | ICD-10-CM

## 2018-01-13 DIAGNOSIS — I4891 Unspecified atrial fibrillation: ICD-10-CM

## 2018-01-13 DIAGNOSIS — D696 Thrombocytopenia, unspecified: ICD-10-CM

## 2018-01-13 DIAGNOSIS — C4490 Unspecified malignant neoplasm of skin, unspecified: ICD-10-CM

## 2018-01-13 DIAGNOSIS — R011 Cardiac murmur, unspecified: ICD-10-CM

## 2018-01-13 DIAGNOSIS — I2510 Atherosclerotic heart disease of native coronary artery without angina pectoris: ICD-10-CM

## 2018-01-13 DIAGNOSIS — E785 Hyperlipidemia, unspecified: ICD-10-CM

## 2018-01-13 DIAGNOSIS — G4730 Sleep apnea, unspecified: ICD-10-CM

## 2018-01-13 DIAGNOSIS — C44621 Squamous cell carcinoma of skin of unspecified upper limb, including shoulder: ICD-10-CM

## 2018-01-13 DIAGNOSIS — M549 Dorsalgia, unspecified: ICD-10-CM

## 2018-01-13 DIAGNOSIS — I350 Nonrheumatic aortic (valve) stenosis: ICD-10-CM

## 2018-01-13 DIAGNOSIS — I214 Non-ST elevation (NSTEMI) myocardial infarction: ICD-10-CM

## 2018-01-18 ENCOUNTER — Encounter: Admit: 2018-01-18 | Discharge: 2018-01-18 | Payer: MEDICARE

## 2018-01-26 ENCOUNTER — Encounter: Admit: 2018-01-26 | Discharge: 2018-01-26 | Payer: MEDICARE

## 2018-01-29 ENCOUNTER — Encounter: Admit: 2018-01-29 | Discharge: 2018-01-29 | Payer: MEDICARE

## 2018-01-29 DIAGNOSIS — I480 Paroxysmal atrial fibrillation: Secondary | ICD-10-CM

## 2018-01-29 DIAGNOSIS — Z79899 Other long term (current) drug therapy: Secondary | ICD-10-CM

## 2018-02-02 ENCOUNTER — Ambulatory Visit: Admit: 2018-02-02 | Discharge: 2018-02-02 | Payer: MEDICARE

## 2018-02-02 ENCOUNTER — Encounter: Admit: 2018-02-02 | Discharge: 2018-02-02 | Payer: MEDICARE

## 2018-02-02 DIAGNOSIS — I350 Nonrheumatic aortic (valve) stenosis: Secondary | ICD-10-CM

## 2018-02-02 DIAGNOSIS — I2510 Atherosclerotic heart disease of native coronary artery without angina pectoris: Secondary | ICD-10-CM

## 2018-02-02 DIAGNOSIS — I214 Non-ST elevation (NSTEMI) myocardial infarction: Secondary | ICD-10-CM

## 2018-02-02 DIAGNOSIS — C44621 Squamous cell carcinoma of skin of unspecified upper limb, including shoulder: Secondary | ICD-10-CM

## 2018-02-02 DIAGNOSIS — G4730 Sleep apnea, unspecified: Secondary | ICD-10-CM

## 2018-02-02 DIAGNOSIS — M549 Dorsalgia, unspecified: Secondary | ICD-10-CM

## 2018-02-02 DIAGNOSIS — R011 Cardiac murmur, unspecified: Secondary | ICD-10-CM

## 2018-02-02 DIAGNOSIS — I10 Essential (primary) hypertension: Secondary | ICD-10-CM

## 2018-02-02 DIAGNOSIS — I4891 Unspecified atrial fibrillation: Secondary | ICD-10-CM

## 2018-02-02 DIAGNOSIS — E785 Hyperlipidemia, unspecified: Secondary | ICD-10-CM

## 2018-02-02 DIAGNOSIS — I480 Paroxysmal atrial fibrillation: Secondary | ICD-10-CM

## 2018-02-02 DIAGNOSIS — C4490 Unspecified malignant neoplasm of skin, unspecified: Secondary | ICD-10-CM

## 2018-02-02 DIAGNOSIS — G4733 Obstructive sleep apnea (adult) (pediatric): Secondary | ICD-10-CM

## 2018-02-08 ENCOUNTER — Ambulatory Visit: Admit: 2018-02-08 | Discharge: 2018-02-09 | Payer: MEDICARE

## 2018-02-08 DIAGNOSIS — I350 Nonrheumatic aortic (valve) stenosis: Secondary | ICD-10-CM

## 2018-02-09 ENCOUNTER — Encounter: Admit: 2018-02-09 | Discharge: 2018-02-09 | Payer: MEDICARE

## 2018-02-16 ENCOUNTER — Encounter: Admit: 2018-02-16 | Discharge: 2018-02-16 | Payer: MEDICARE

## 2018-02-16 DIAGNOSIS — I480 Paroxysmal atrial fibrillation: Secondary | ICD-10-CM

## 2018-02-16 DIAGNOSIS — Z79899 Other long term (current) drug therapy: Secondary | ICD-10-CM

## 2018-02-16 LAB — LIVER FUNCTION PANEL
Lab: 6.5
Lab: 0.9
Lab: 172 — ABNORMAL HIGH (ref 40–150)
Lab: 28
Lab: 30

## 2018-02-16 LAB — TSH WITH FREE T4 REFLEX
Lab: 0.3 — ABNORMAL LOW (ref 0.35–4.94)
Lab: 0.9

## 2018-02-16 LAB — LIPID PROFILE: Lab: 116 — ABNORMAL LOW (ref 150–200)

## 2018-03-18 ENCOUNTER — Encounter: Admit: 2018-03-18 | Discharge: 2018-03-18 | Payer: MEDICARE

## 2018-03-18 MED ORDER — AMIODARONE 200 MG PO TAB
200 mg | ORAL_TABLET | Freq: Every day | ORAL | 3 refills | Status: SS
Start: 2018-03-18 — End: 2018-06-11

## 2018-03-18 MED ORDER — APIXABAN 5 MG PO TAB
5 mg | ORAL_TABLET | Freq: Two times a day (BID) | ORAL | 3 refills | Status: SS
Start: 2018-03-18 — End: 2018-06-11

## 2018-03-26 ENCOUNTER — Encounter: Admit: 2018-03-26 | Discharge: 2018-03-26 | Payer: MEDICARE

## 2018-03-26 DIAGNOSIS — I350 Nonrheumatic aortic (valve) stenosis: Principal | ICD-10-CM

## 2018-03-26 DIAGNOSIS — Z01810 Encounter for preprocedural cardiovascular examination: ICD-10-CM

## 2018-03-26 MED ORDER — MAGNESIUM HYDROXIDE 2,400 MG/10 ML PO SUSP
10 mL | ORAL | 0 refills | Status: CN | PRN
Start: 2018-03-26 — End: ?

## 2018-03-26 MED ORDER — NITROGLYCERIN 0.4 MG SL SUBL
.4 mg | SUBLINGUAL | 0 refills | Status: CN | PRN
Start: 2018-03-26 — End: ?

## 2018-03-26 MED ORDER — TEMAZEPAM 15 MG PO CAP
15 mg | Freq: Every evening | ORAL | 0 refills | Status: CN | PRN
Start: 2018-03-26 — End: ?

## 2018-03-26 MED ORDER — ACETAMINOPHEN 325 MG PO TAB
650 mg | ORAL | 0 refills | Status: CN | PRN
Start: 2018-03-26 — End: ?

## 2018-03-26 MED ORDER — ALUMINUM-MAGNESIUM HYDROXIDE 200-200 MG/5 ML PO SUSP
30 mL | ORAL | 0 refills | Status: CN | PRN
Start: 2018-03-26 — End: ?

## 2018-03-26 MED ORDER — ASPIRIN 325 MG PO TAB
325 mg | Freq: Once | ORAL | 0 refills | Status: CN
Start: 2018-03-26 — End: ?

## 2018-04-05 ENCOUNTER — Encounter: Admit: 2018-04-05 | Discharge: 2018-04-05 | Payer: MEDICARE

## 2018-04-05 MED ORDER — ATORVASTATIN 40 MG PO TAB
40 mg | ORAL_TABLET | Freq: Every day | ORAL | 3 refills | Status: AC
Start: 2018-04-05 — End: 2019-01-24

## 2018-04-14 ENCOUNTER — Encounter: Admit: 2018-04-14 | Discharge: 2018-04-14 | Payer: MEDICARE

## 2018-04-19 ENCOUNTER — Encounter: Admit: 2018-04-19 | Discharge: 2018-04-19 | Payer: MEDICARE

## 2018-04-19 MED ORDER — LOSARTAN 50 MG PO TAB
ORAL_TABLET | Freq: Every day | ORAL | 3 refills | 30.00000 days | Status: AC
Start: 2018-04-19 — End: 2018-04-28

## 2018-04-28 ENCOUNTER — Encounter: Admit: 2018-04-28 | Discharge: 2018-04-28 | Payer: MEDICARE

## 2018-04-28 MED ORDER — LOSARTAN 50 MG PO TAB
50 mg | ORAL_TABLET | Freq: Every day | ORAL | 2 refills | 30.00000 days | Status: DC
Start: 2018-04-28 — End: 2018-04-29

## 2018-04-29 ENCOUNTER — Encounter: Admit: 2018-04-29 | Discharge: 2018-04-29 | Payer: MEDICARE

## 2018-04-29 MED ORDER — LOSARTAN 50 MG PO TAB
50 mg | ORAL_TABLET | Freq: Every day | ORAL | 3 refills | 30.00000 days | Status: DC
Start: 2018-04-29 — End: 2018-04-29

## 2018-04-29 MED ORDER — LOSARTAN 50 MG PO TAB
50 mg | ORAL_TABLET | Freq: Every day | ORAL | 1 refills | 90.00000 days | Status: DC
Start: 2018-04-29 — End: 2018-05-27

## 2018-05-17 ENCOUNTER — Encounter: Admit: 2018-05-17 | Discharge: 2018-05-17 | Payer: MEDICARE

## 2018-05-19 ENCOUNTER — Encounter: Admit: 2018-05-19 | Discharge: 2018-05-19 | Payer: MEDICARE

## 2018-05-19 NOTE — Progress Notes
Medicare is listed as patient's primary insurance coverage.  Pre-certification is not required for hospitalizations.

## 2018-05-22 ENCOUNTER — Encounter: Admit: 2018-05-22 | Discharge: 2018-05-22 | Payer: MEDICARE

## 2018-05-22 DIAGNOSIS — I350 Nonrheumatic aortic (valve) stenosis: Principal | ICD-10-CM

## 2018-05-24 ENCOUNTER — Encounter: Admit: 2018-05-24 | Discharge: 2018-05-24 | Payer: MEDICARE

## 2018-05-24 ENCOUNTER — Ambulatory Visit: Admit: 2018-05-24 | Discharge: 2018-05-24 | Payer: MEDICARE

## 2018-05-24 DIAGNOSIS — M549 Dorsalgia, unspecified: ICD-10-CM

## 2018-05-24 DIAGNOSIS — I350 Nonrheumatic aortic (valve) stenosis: ICD-10-CM

## 2018-05-24 DIAGNOSIS — E785 Hyperlipidemia, unspecified: ICD-10-CM

## 2018-05-24 DIAGNOSIS — C44621 Squamous cell carcinoma of skin of unspecified upper limb, including shoulder: ICD-10-CM

## 2018-05-24 DIAGNOSIS — I4891 Unspecified atrial fibrillation: ICD-10-CM

## 2018-05-24 DIAGNOSIS — I2510 Atherosclerotic heart disease of native coronary artery without angina pectoris: ICD-10-CM

## 2018-05-24 DIAGNOSIS — C4490 Unspecified malignant neoplasm of skin, unspecified: ICD-10-CM

## 2018-05-24 DIAGNOSIS — I10 Essential (primary) hypertension: Principal | ICD-10-CM

## 2018-05-24 DIAGNOSIS — I214 Non-ST elevation (NSTEMI) myocardial infarction: ICD-10-CM

## 2018-05-24 DIAGNOSIS — R011 Cardiac murmur, unspecified: ICD-10-CM

## 2018-05-24 DIAGNOSIS — G4730 Sleep apnea, unspecified: ICD-10-CM

## 2018-05-24 DIAGNOSIS — G4733 Obstructive sleep apnea (adult) (pediatric): ICD-10-CM

## 2018-05-24 LAB — CBC
Lab: 10 FL (ref 7–11)
Lab: 13 g/dL (ref 13.5–16.5)
Lab: 15 % — ABNORMAL LOW (ref >60)
Lab: 166 10*3/uL (ref >60)
Lab: 33 g/dL (ref 32.0–36.0)
Lab: 40 % (ref 40–50)
Lab: 94 FL (ref 80–100)

## 2018-05-24 LAB — BASIC METABOLIC PANEL
Lab: 140 MMOL/L (ref 137–147)
Lab: 4.5 MMOL/L (ref 3.5–5.1)

## 2018-05-24 MED ORDER — DIPHENHYDRAMINE HCL 50 MG/ML IJ SOLN
25 mg | INTRAVENOUS | 0 refills | Status: DC | PRN
Start: 2018-05-24 — End: 2018-05-27

## 2018-05-24 MED ORDER — LOSARTAN 50 MG PO TAB
50 mg | Freq: Every day | ORAL | 0 refills | Status: DC
Start: 2018-05-24 — End: 2018-05-25
  Administered 2018-05-25: 13:00:00 50 mg via ORAL

## 2018-05-24 MED ORDER — TICAGRELOR 90 MG PO TAB
90 mg | Freq: Once | ORAL | 0 refills | Status: CP
Start: 2018-05-24 — End: ?
  Administered 2018-05-25: 08:00:00 90 mg via ORAL

## 2018-05-24 MED ORDER — ACETAMINOPHEN 325 MG PO TAB
650 mg | ORAL | 0 refills | Status: DC | PRN
Start: 2018-05-24 — End: 2018-05-27

## 2018-05-24 MED ORDER — CLOPIDOGREL 300 MG PO TAB
600 mg | Freq: Once | ORAL | 0 refills | Status: DC
Start: 2018-05-24 — End: 2018-05-25

## 2018-05-24 MED ORDER — CLOPIDOGREL 300 MG PO TAB
300 mg | Freq: Once | ORAL | 0 refills | Status: CP
Start: 2018-05-24 — End: ?
  Administered 2018-05-25: 21:00:00 300 mg via ORAL

## 2018-05-24 MED ORDER — DIPHENHYDRAMINE HCL 25 MG PO CAP
25 mg | ORAL | 0 refills | Status: DC | PRN
Start: 2018-05-24 — End: 2018-05-27

## 2018-05-24 MED ORDER — NITROGLYCERIN 0.4 MG SL SUBL
.4 mg | SUBLINGUAL | 0 refills | Status: DC | PRN
Start: 2018-05-24 — End: 2018-05-27

## 2018-05-24 MED ORDER — METHIMAZOLE 10 MG PO TAB
10 mg | Freq: Two times a day (BID) | ORAL | 0 refills | Status: DC
Start: 2018-05-24 — End: 2018-05-27
  Administered 2018-05-25 – 2018-05-27 (×6): 10 mg via ORAL

## 2018-05-24 MED ORDER — ALUMINUM-MAGNESIUM HYDROXIDE 200-200 MG/5 ML PO SUSP
30 mL | ORAL | 0 refills | Status: DC | PRN
Start: 2018-05-24 — End: 2018-05-27
  Administered 2018-05-27: 10:00:00 30 mL via ORAL

## 2018-05-24 MED ORDER — SODIUM CHLORIDE 0.9 % IV SOLP
250 mL | INTRAVENOUS | 0 refills | Status: CP
Start: 2018-05-24 — End: ?
  Administered 2018-05-24: 14:00:00 250 mL via INTRAVENOUS

## 2018-05-24 MED ORDER — SODIUM CHLORIDE 0.9 % IV SOLP
750 mL | Freq: Once | INTRAVENOUS | 0 refills | Status: CP
Start: 2018-05-24 — End: ?
  Administered 2018-05-24: 14:00:00 750 mL via INTRAVENOUS

## 2018-05-24 MED ORDER — ASPIRIN 325 MG PO TAB
325 mg | Freq: Once | ORAL | 0 refills | Status: AC
Start: 2018-05-24 — End: ?

## 2018-05-24 MED ORDER — APIXABAN 5 MG PO TAB
5 mg | Freq: Two times a day (BID) | ORAL | 0 refills | Status: DC
Start: 2018-05-24 — End: 2018-05-25
  Administered 2018-05-25 (×2): 5 mg via ORAL

## 2018-05-24 MED ORDER — MAGNESIUM HYDROXIDE 2,400 MG/10 ML PO SUSP
10 mL | ORAL | 0 refills | Status: DC | PRN
Start: 2018-05-24 — End: 2018-05-27

## 2018-05-24 MED ORDER — ATORVASTATIN 40 MG PO TAB
40 mg | Freq: Every day | ORAL | 0 refills | Status: DC
Start: 2018-05-24 — End: 2018-05-27
  Administered 2018-05-25 – 2018-05-27 (×3): 40 mg via ORAL

## 2018-05-24 MED ORDER — METOPROLOL TARTRATE 25 MG PO TAB
50 mg | Freq: Two times a day (BID) | ORAL | 0 refills | Status: DC
Start: 2018-05-24 — End: 2018-05-25

## 2018-05-24 MED ORDER — CLOPIDOGREL 75 MG PO TAB
75 mg | Freq: Every day | ORAL | 0 refills | Status: DC
Start: 2018-05-24 — End: 2018-05-25

## 2018-05-24 MED ORDER — AMIODARONE 200 MG PO TAB
200 mg | Freq: Every day | ORAL | 0 refills | Status: DC
Start: 2018-05-24 — End: 2018-05-27
  Administered 2018-05-25 – 2018-05-27 (×3): 200 mg via ORAL

## 2018-05-24 MED ORDER — CLOPIDOGREL 75 MG PO TAB
75 mg | Freq: Every day | ORAL | 0 refills | Status: DC
Start: 2018-05-24 — End: 2018-05-27
  Administered 2018-05-26 – 2018-05-27 (×2): 75 mg via ORAL

## 2018-05-24 MED ORDER — ONDANSETRON HCL (PF) 4 MG/2 ML IJ SOLN
4 mg | INTRAVENOUS | 0 refills | Status: DC | PRN
Start: 2018-05-24 — End: 2018-05-27

## 2018-05-24 MED ORDER — SODIUM CHLORIDE 0.9 % IV SOLP
INTRAVENOUS | 0 refills | Status: AC
Start: 2018-05-24 — End: ?

## 2018-05-24 MED ORDER — TAMSULOSIN 0.4 MG PO CAP
.4 mg | Freq: Every evening | ORAL | 0 refills | Status: DC
Start: 2018-05-24 — End: 2018-05-27
  Administered 2018-05-25 – 2018-05-27 (×3): 0.4 mg via ORAL

## 2018-05-24 MED ORDER — TEMAZEPAM 15 MG PO CAP
15 mg | Freq: Every evening | ORAL | 0 refills | Status: DC | PRN
Start: 2018-05-24 — End: 2018-05-27

## 2018-05-24 NOTE — Progress Notes
1191- Patient arrived on unit via ambulation. Patient transferred to the bed without assistance. Frailty score equals 3  Assessment completed, refer to flowsheet for details. Orders released, reviewed, and implemented as appropriate. Oriented to surroundings, call light within reach. Plan of care reviewed.  Will continue to monitor and assess.

## 2018-05-24 NOTE — Discharge Instructions - Pharmacy
worse when compared to his previous study.  He has recently been noting increased weakness and chest discomfort and wanted to proceed with TAVR work-up.    The patient presented for TAVR workup including a cardiac catheterization, which was done via right femoral artery access, and revealed a flow limiting stenosis of the mid circumflex, into the OM2. The mid right posterior descending artery had an 80% stenosis. Both the circumflex and right PDA were treated with drug eluting stents.     The patient was evaluated by CT surgeon Dr. Helen Hashimoto, who felt the patient was a candidate for TAVR, pending results of the rest of his workup. He had PFTs just prior to admission and a carotid doppler was completed, noted below. He also had a CTA of the chest, abdomen and pelvis the following morning, outlined below. This revealed a small right groin pseudoaneurysm. A right groin duplex confirmed the pseudoaneurysm, measured at 1.06 x 1.57 cm.  Patient seems to be asymptomatic.  Manual pressure was held and patient was put on bedrest.  Eliquis was held and a right groin duplex was repeated on the morning of 05/26/18, revealing persistent pseudoaneurysm.  He was therefore taken to the lab for right groin thrombin injection.  He was monitored overnight and right groin duplex on 4/30 revealed resolved pseudoaneurysm.    The patient was monitored overnight, with no further events. He ambulated in the hall, without chest discomfort or dyspnea. He will continue GDMT.        Upon discharge the patient and family were given post procedure instructions/restrictions as well as written instructions (see Discharge instructions).    Discharge Plan:     Follow up: The valve team will contact the patient once all his testing for TAVR has been reviewed for candidacy of this surgical procedure. He is tentatively scheduled for TAVR on 06/10/2018.     DAPT/Anticoagulation: He was initially loaded with Brilinta but since he is on anticoagulation, he was not started on Aspirin and Brilinta was switched to Plavix with a loading dose, followed by 75mg  daily for 6 months. Continue Apixaban. Once Plavix is completed he will resume aspirin.     Disease Prevention:  Lipid management:  His LDL was 63 in January 2020 and Atorvastatin 40 mg daily was continued.       Hypertension: BP was initially elevated, and he was found to be bradycardic without symptoms.  Metoprolol was decreased to 25mg  twice daily and Losartan was increased to 100mg  daily.      Diabetes: NA    Tobacco: NA-quit 1971    Obesity: BMI is 25.4. Heart healthy diet, exercise and weight loss encouraged    Condition at Discharge: Stable   BP (!) 151/83  - Pulse 55  - Temp 36.8 ???C (98.3 ???F)  - Ht 1.854 m (6' 1)  - Wt 87.4 kg (192 lb 9.6 oz)  - SpO2 96%  - BMI 25.41 kg/m???   The right groin is soft, without hematoma or bruit. Pedal pulses are intact.      Discharge Diagnoses:    Hospital Problems        Active Problems    * (Principal) Aortic stenosis    Hypertension    Obstructive sleep apnea    Paroxysmal atrial fibrillation (HCC)    Coronary artery disease due to calcified coronary lesion    HLD (hyperlipidemia)    Severe aortic stenosis    Coronary artery disease due to lipid rich plaque    Pseudoaneurysm  of right femoral artery [facility])        Surgical Procedures: none    Significant Diagnostic Studies and Procedures:   Cardiac Catheterization 05/24/2018  CONCLUSION:    1. Successful drug-eluting stent percutaneous coronary intervention of mid left circumflex into obtuse marginal 2 with 2.25 x 28 mm Xience Sierra drug-eluting stent.  2. Successful drug-eluting stent percutaneous coronary intervention of mid right posterior descending artery with 2.75 x 15 mm Xience Sierra drug-eluting stent.  ???  RECOMMENDATIONS:    1. Standard post catheterization care.  2. The patient was initially loaded with 180 mg of ticagrelor prior to 4. No evidence of proximal subclavian stenosis bilaterally  5. Bilaterally enlarged thyroid gland noted.  Dedicated thyroid ultrasound is recommended if clinically indicated.  ???    Consults:  CTS        Patient instructions/medications:       Cardiac Diet    Limiting unhealthy fats and cholesterol is the most important step you can take in reducing your risk for cardiovascular disease.  Unhealthy fats include saturated and trans fats.  Monitor your sodium and cholesterol intake.  Restrict your sodium to 2g (grams) or 2000mg  (milligrams) daily, and your cholesterol to 200mg  daily.    If you have questions regarding your diet at home, you may contact a dietitian at [phone].       Report These Signs and Symptoms    Please contact your doctor if you have any of the following symptoms: Chest pain, shortness of breath, lightheadedness, dizziness, near fainting, palpitations, abd pain, back pain, or bleeding.     Risk Reduction Plan    Cardiac Event Personal Risk Factor Reduction Plan  **Take this sheet to your physician to show treatment recommendations**  Joel Hernandez  Admission Date: 05/24/2018 LOS: @LOS @       Blood Pressure Risk  Goal: Keep blood pressure below 130/80  Your Numbers:  BP Readings from Last 1 Encounters:  05/24/18 : (!) 148/74     Plan: High blood pressure is the single most important risk factor for cardiac events because it's the #1 cause of cardiac events.  Take medication as prescribed and monitor your blood pressure.    Abnormal lipids (fats in blood) Risk   Goal: Total Cholesterol: <200  LDL (bad cholesterol): primary prevention <100   LDL (bad cholesterol): secondary prevention < 70  HDL (good cholesterol): >40 for men, >50 for women  Triglycerides: <150  Your Numbers: Cholesterol       Date                     Value               Ref Range           Status                02/16/2018               116 (L)             150 - 200           Final            ----------  LDL Date                     Value               Ref Range  Status                02/16/2018               63                                      Final            ----------  HDL       Date                     Value               Ref Range           Status                02/16/2018               39                                      Final            ----------  Triglycerides       Date                     Value               Ref Range           Status                02/16/2018               67                                      Final            ----------  Plan: Diets high in saturated fat, trans fat and cholesterol can raise blood cholesterol levels increasing your risk of having a cardiac event.  Take medication as prescribed and eat a heart healthy, low sodium diet.    Smoking Risk   Goals: If you smoke, STOP!   Plan: Smoking DOUBLES your risk of having a cardiac event. Quitting can greatly reduce your risk. To register for smoking cessation program call [phone] or visit www.smokefree.gov    Diabetes Risk   Goal: Non-diabetic: Below 5.7%  Goal for diabetic: Less than 7%  Your Numbers: Hemoglobin A1C       Date                     Value               Ref Range           Status                01/18/2016               5.5                 4.0 - 6.0 %         Final              Comment:    The ADA recommends that most patients with type 1  and type 2 diabetes maintain     an A1c level <7%.      ----------  Plan: If you have diabetes, even if treated, you are at an increased risk of having a cardiac event.     Alcohol Use Risk  Goal: Alcohol use can lead to a cardiac event.  Plan: For men, limit intake to no more than 2 drinks per day.  For women, limit intake to no more than one drink per day.    Weight Management Risk   Goal: Healthy: BMI is 18.5 to 24.9  Overweight: BMI is 25 to 29.9  Obese: BMI is 30 or higher  Morbid Obesity: BMI [...]  Your Numbers: Your BMI (Calculated): 25.41 before the procedure.      Current Discharge Medication List       START taking these medications    Details   clopiDOGrel (PLAVIX) 75 mg tablet Take one tablet by mouth daily.  Qty: 90 tablet, Refills: 3    PRESCRIPTION TYPE:  Normal          CONTINUE these medications which have been CHANGED or REFILLED    Details   losartan (COZAAR) 100 mg tablet Take one tablet by mouth daily.  Qty: 90 tablet, Refills: 3    PRESCRIPTION TYPE:  Normal      metoprolol tartrate (LOPRESSOR) 50 mg tablet Take one tablet by mouth twice daily.  Qty: 360 tablet, Refills: 3    PRESCRIPTION TYPE:  Normal          CONTINUE these medications which have NOT CHANGED    Details   amiodarone (CORDARONE) 200 mg tablet Take one tablet by mouth daily. Take with food.  Qty: 90 tablet, Refills: 3    PRESCRIPTION TYPE:  Normal  Comments: Please consider 90 day supplies to promote better adherence      apixaban (ELIQUIS) 5 mg tablet Take one tablet by mouth twice daily.  Qty: 180 tablet, Refills: 3    PRESCRIPTION TYPE:  Normal      atorvastatin (LIPITOR) 40 mg tablet Take one tablet by mouth daily.  Qty: 90 tablet, Refills: 3    PRESCRIPTION TYPE:  Normal      methIMAzole (TAPAZOLE) 10 mg tablet Take 1 tablet by mouth twice daily.    PRESCRIPTION TYPE:  Historical Med      nitroglycerin (NITROSTAT) 0.4 mg tablet Place one tablet under tongue every 5 minutes as needed for Chest Pain. Max of 3 tablets, call 911.  Qty: 25 tablet, Refills: 3    PRESCRIPTION TYPE:  Normal      tamsulosin (FLOMAX) 0.4 mg capsule Take 1 capsule by mouth at bedtime daily.    PRESCRIPTION TYPE:  Historical Med          The following medications were removed from your list. This list includes medications discontinued this stay and those removed from your prior med list in our system        aspirin EC 81 mg tablet                Scheduled appointments:    Jun 07, 2018 12:00 PM CDT  Pre-Op Nurse with PREOPNURSECTS  [facility] (CTS) [address]  [place] [address]  [phone]   Jun 07, 2018 12:00 PM CDT  [facility] Office Visit with PAC ROOM 2  [facility] (--) [address]  [address]  [place] [address]  [phone]          Signed:  Dorena Cookey, APRN-NP  05/27/2018      cc:  Primary Care Physician:  Lavone Orn   Verified  Referring physicians:  Lavone Orn, DO   Additional provider(s):  Danella Maiers, MD

## 2018-05-24 NOTE — H&P (View-Only)
Patient presents for procedure. Please see History and Physical copied below from date of service 05/24/2018.    Suzy Bouchard, PA-C [phone])      Progress Notes   Hosie Spangle, APRN-NP (Nurse Practitioner) ??? ??? Nurse Practitioner, Family ??? ??? 05/24/18 0800 ??? ??? Signed      Date of Service: 05/24/2018  ???  Joel Hernandez is a 78 y.o. male.     ???  HPI  ???  I had the pleasure of seeing Joel Hernandez for an updated H&P prior to cardiac catheterization.  He is a 78 year old with history of aortic stenosis, coronary artery disease, hypertension, hyperlipidemia, hyperthyroidism, paroxysmal A. fib and obstructive sleep apnea.  He has had a prior PCI with a drug-eluting stent to the LAD in 2017.    ???  He has aortic stenosis that Dr. Barry Dienes has been monitoring.  His last echocardiogram was in January.  He had an EF of 70%.  The valve was not well visualized but the mean gradient appeared to be 42 mmHg with a velocity 4.4 m/s and a velocity ratio of 0.23.  It appeared that the valve has worsened when compared to the prior study.  At that time he was relatively asymptomatic and we were planning to proceed with TAVR work-up after COVID crisis had passed, however over the past few weeks he has noticed an increase in weakness and some chest discomfort.  He has had minimal shortness of breath. He feels like he is going downhill.  He was concerned and wanted to move forward with the work-up.  He scheduled for cardiac catheterization today.  ???  ???      Vitals:   ??? 05/24/18 0800   BP: 132/62   BP Source: Arm, Left Upper   Pulse: 45   SpO2: 98%   Weight: 93 kg (205 lb)   Height: 1.854 m (6' 1)   PainSc: Zero   ???  Body mass index is 27.05 kg/m???.   ???  Past Medical History        Patient Active Problem List   ??? Diagnosis Date Noted   ??? Foot drop 12/04/2016   ??? HLD (hyperlipidemia) 12/04/2016   ??? Spinal stenosis at L4-L5 level 12/04/2016   ??? Thrombocytopenia (HCC) 12/01/2016 Stent x 1 LAD placement following NSTEMI   ??? RIGHT LUMBAR 4-5 REDO LAMINECTOMY AND LATERAL RECESS DECOMPRESSION Right 12/04/2016    Performed by Theotis Barrio, MD at [facility]   ??? LAMINECTOMY/ FACETECTOMY/ FORAMINOTOMY WITH DECOMPRESSION - 1 VERTEBRAL SEGMENT - LUMBAR  12/04/2016    Performed by Theotis Barrio, MD at [facility]   ??? COLONOSCOPY     ??? HX HEART CATHETERIZATION     ??? SKIN CANCER EXCISION  2017, 2018    [facility] Arm      Family History   Problem Relation Age of Onset   ??? Heart Disease Mother        ???  Review of Systems   Constitution: Positive for malaise/fatigue.   HENT: Negative.    Eyes: Negative.    Cardiovascular: Negative.         Discomfort feeling.   Respiratory: Negative.    Endocrine: Negative.    Hematologic/Lymphatic: Negative.    Skin: Negative.    Musculoskeletal: Negative.    Gastrointestinal: Negative.    Genitourinary: Negative.    Neurological: Positive for light-headedness.   Psychiatric/Behavioral: Negative.    Allergic/Immunologic: Negative.    ???  ???  Physical Exam  General Appearance: no acute distress  Skin: warm & intact  HEENT: unremarkable  Neck Veins: neck veins are flat & not distended  Carotid Arteries: no bruits  Chest Inspection: chest is normal in appearance  Auscultation/Percussion: lungs clear to auscultation, no rales, rhonchi, or wheezing  Cardiac Rhythm: regular rhythm & bradycardia  Cardiac Auscultation: Normal S1 & S2, no S3 or S4, no rub  Murmurs: 3/6 systolic murmur RUSB   Extremities: no lower extremity edema; 2+ symmetric distal pulses  Abdominal Exam: soft, non-tender, no masses, bowel sounds normal  Liver & Spleen: no organomegaly  Neurologic Exam: oriented to time, place and person; no focal neurologic deficits  Psychiatric: Normal mood and affect.  Behavior is normal. Judgment and thought content normal.   ???  ???  ???  Cardiovascular Studies  Preliminary EKG:    Sinus bradycardia, rate 45 bpm.  LVH with IVCD  ???  Problems Addressed Today       Encounter Diagnoses Name Primary?   ??? Nonrheumatic aortic valve stenosis Yes   ??? Paroxysmal atrial fibrillation (HCC) ???   ??? Coronary artery disease due to calcified coronary lesion ???   ??? Mixed hyperlipidemia ???   ??? Essential hypertension ???   ???  ???  Assessment and Plan  ???  1.  Severe aortic stenosis.  He has had an increase in symptoms including chest pain and weakness.  We will proceed with work-up for TAVR.  He had PFTs today which were normal.  He is scheduled for cardiac catheterization by Dr. Mackey Birchwood today.  He will also need a CTA and carotid duplex.  He will need a CTS consult while in-house.  ???  2.  Coronary artery disease with prior PCI to the LAD.  ???  3.  Paroxysmal A. fib.  He is on amiodarone and apixaban.  His last dose of apixaban was last Thursday.  ???  4.  Hypertension.  ???  5.  Hyperlipidemia.  ???  We discussed the procedure in detail and all questions were answered.  He denies any allergies to contrast dye.  He has been n.p.o. since midnight.  We have him tentatively scheduled for TAVR on May 14.  Thank you for allowing [place] to participate in the care of this pleasant individual.  If you have any other questions or concerns, please do not hesitate to contact us.  ???  Sherrlyn Hock, APRN  Structural Heart Nurse Practitioner  Pager [phone]  ???  ???  ???  Current Medications (including today's revisions)  ??? amiodarone (CORDARONE) 200 mg tablet Take one tablet by mouth daily. Take with food.   ??? apixaban (ELIQUIS) 5 mg tablet Take one tablet by mouth twice daily.   ??? aspirin EC 81 mg tablet Take one tablet by mouth daily. Take with food.   ??? atorvastatin (LIPITOR) 40 mg tablet Take one tablet by mouth daily.   ??? losartan (COZAAR) 50 mg tablet Take one tablet by mouth daily.   ??? methIMAzole (TAPAZOLE) 10 mg tablet Take 1 tablet by mouth twice daily.   ??? metoprolol tartrate (LOPRESSOR) 50 mg tablet Take 1 tablet by mouth twice daily.   ??? nitroglycerin (NITROSTAT) 0.4 mg tablet Place one tablet under tongue

## 2018-05-24 NOTE — Progress Notes
at [facility] cardioversion unsuccessful.  DC'ed on amiodarone & Eliquis.    01/2016 - Successful outpatient cardioversion at [facility].  Amiodarone continued    02/2016 - Office follow-up--back in AF, asymptomatic with good rate control.     ??? Hypertension 09/15/2013     BP medication started in mid-30's     ??? Aortic stenosis 09/15/2013     09/20/2013:  Echo ([place]) shows moderate AS, mean gradient 20.  Peak gradient 44.1 mmHg.  AVA 2.1 cm2.  EF 72%.  Aorta normal.    12/2015:  Moderate AS by echo     ??? Obstructive sleep apnea 09/15/2013     Circa 2004 - Diagnosis established, CPAP started (Dr. Mayford Knife, pulmonologist in [facility])           Review of Systems   Constitution: Positive for malaise/fatigue.   HENT: Negative.    Eyes: Negative.    Cardiovascular: Negative.         Discomfort feeling.   Respiratory: Negative.    Endocrine: Negative.    Hematologic/Lymphatic: Negative.    Skin: Negative.    Musculoskeletal: Negative.    Gastrointestinal: Negative.    Genitourinary: Negative.    Neurological: Positive for light-headedness.   Psychiatric/Behavioral: Negative.    Allergic/Immunologic: Negative.        Physical Exam  General Appearance: no acute distress  Skin: warm & intact  HEENT: unremarkable  Neck Veins: neck veins are flat & not distended  Carotid Arteries: no bruits  Chest Inspection: chest is normal in appearance  Auscultation/Percussion: lungs clear to auscultation, no rales, rhonchi, or wheezing  Cardiac Rhythm: regular rhythm & bradycardia  Cardiac Auscultation: Normal S1 & S2, no S3 or S4, no rub  Murmurs: 3/6 systolic murmur RUSB   Extremities: no lower extremity edema; 2+ symmetric distal pulses  Abdominal Exam: soft, non-tender, no masses, bowel sounds normal  Liver & Spleen: no organomegaly  Neurologic Exam: oriented to time, place and person; no focal neurologic deficits  Psychiatric: Normal mood and affect.  Behavior is normal. Judgment and thought content normal.

## 2018-05-25 ENCOUNTER — Encounter: Admit: 2018-05-25 | Discharge: 2018-05-25 | Payer: MEDICARE

## 2018-05-25 DIAGNOSIS — R011 Cardiac murmur, unspecified: ICD-10-CM

## 2018-05-25 DIAGNOSIS — I214 Non-ST elevation (NSTEMI) myocardial infarction: ICD-10-CM

## 2018-05-25 DIAGNOSIS — M549 Dorsalgia, unspecified: ICD-10-CM

## 2018-05-25 DIAGNOSIS — I724 Aneurysm of artery of lower extremity: ICD-10-CM

## 2018-05-25 DIAGNOSIS — I350 Nonrheumatic aortic (valve) stenosis: ICD-10-CM

## 2018-05-25 DIAGNOSIS — I10 Essential (primary) hypertension: Principal | ICD-10-CM

## 2018-05-25 DIAGNOSIS — C4490 Unspecified malignant neoplasm of skin, unspecified: ICD-10-CM

## 2018-05-25 DIAGNOSIS — G4730 Sleep apnea, unspecified: ICD-10-CM

## 2018-05-25 DIAGNOSIS — I4891 Unspecified atrial fibrillation: ICD-10-CM

## 2018-05-25 DIAGNOSIS — G4733 Obstructive sleep apnea (adult) (pediatric): ICD-10-CM

## 2018-05-25 DIAGNOSIS — E785 Hyperlipidemia, unspecified: ICD-10-CM

## 2018-05-25 DIAGNOSIS — I2510 Atherosclerotic heart disease of native coronary artery without angina pectoris: ICD-10-CM

## 2018-05-25 DIAGNOSIS — C44621 Squamous cell carcinoma of skin of unspecified upper limb, including shoulder: ICD-10-CM

## 2018-05-25 LAB — BASIC METABOLIC PANEL: Lab: 137 MMOL/L — ABNORMAL LOW (ref >60)

## 2018-05-25 MED ORDER — METOPROLOL TARTRATE 25 MG PO TAB
25 mg | Freq: Once | ORAL | 0 refills | Status: CP
Start: 2018-05-25 — End: ?
  Administered 2018-05-25: 19:00:00 25 mg via ORAL

## 2018-05-25 MED ORDER — POTASSIUM CHLORIDE 20 MEQ/15 ML PO LIQD
40-60 meq | NASOGASTRIC | 0 refills | Status: DC | PRN
Start: 2018-05-25 — End: 2018-05-27

## 2018-05-25 MED ORDER — METOPROLOL TARTRATE 25 MG PO TAB
25 mg | Freq: Two times a day (BID) | ORAL | 0 refills | Status: DC
Start: 2018-05-25 — End: 2018-05-26
  Administered 2018-05-26 (×2): 25 mg via ORAL

## 2018-05-25 MED ORDER — IOHEXOL 350 MG IODINE/ML IV SOLN
100 mL | Freq: Once | INTRAVENOUS | 0 refills | Status: CP
Start: 2018-05-25 — End: ?
  Administered 2018-05-25: 12:00:00 100 mL via INTRAVENOUS

## 2018-05-25 MED ORDER — POTASSIUM CHLORIDE 20 MEQ PO TBTQ
40-60 meq | ORAL | 0 refills | Status: DC | PRN
Start: 2018-05-25 — End: 2018-05-27
  Administered 2018-05-25 – 2018-05-27 (×2): 40 meq via ORAL

## 2018-05-25 MED ORDER — LOSARTAN 50 MG PO TAB
100 mg | Freq: Every day | ORAL | 0 refills | Status: DC
Start: 2018-05-25 — End: 2018-05-27
  Administered 2018-05-26 – 2018-05-27 (×2): 100 mg via ORAL

## 2018-05-25 MED ORDER — SODIUM CHLORIDE 0.9 % IJ SOLN
50 mL | Freq: Once | INTRAVENOUS | 0 refills | Status: CP
Start: 2018-05-25 — End: ?
  Administered 2018-05-25: 12:00:00 50 mL via INTRAVENOUS

## 2018-05-25 MED ORDER — POTASSIUM CHLORIDE IN WATER 10 MEQ/50 ML IV PGBK
10 meq | INTRAVENOUS | 0 refills | Status: DC | PRN
Start: 2018-05-25 — End: 2018-05-27

## 2018-05-25 NOTE — Progress Notes
BP: 125/66   130/73   Pulse: 59 49 54 56   Temp: 36.5 ???C (97.7 ???F)   36.8 ???C (98.2 ???F)   SpO2: 97% 96% 97% 98%   Weight:       Height:           PHYSICAL EXAM  Cardiovascular: Systolic mummur  Lungs: clear to auscultation bilaterally    Basic Metabolic Profile    Lab Results   Component Value Date/Time    NA 137 05/25/2018 03:05 AM    K 3.8 05/25/2018 03:05 AM    CA 8.6 05/25/2018 03:05 AM    CL 105 05/25/2018 03:05 AM    CO2 23 05/25/2018 03:05 AM    GAP 9 05/25/2018 03:05 AM    Lab Results   Component Value Date/Time    BUN 15 05/25/2018 03:05 AM    CR 0.99 05/25/2018 03:05 AM    GLU 86 05/25/2018 03:05 AM        CBC w/Diff    Lab Results   Component Value Date/Time    WBC 8.2 05/25/2018 03:05 AM    RBC 3.85 (L) 05/25/2018 03:05 AM    HGB 12.2 (L) 05/25/2018 03:05 AM    HCT 35.7 (L) 05/25/2018 03:05 AM    MCV 92.8 05/25/2018 03:05 AM    MCH 31.6 05/25/2018 03:05 AM    MCHC 34.0 05/25/2018 03:05 AM    RDW 14.8 05/25/2018 03:05 AM    PLTCT 127 (L) 05/25/2018 03:05 AM    MPV 10.0 05/25/2018 03:05 AM    Lab Results   Component Value Date/Time    NEUT 68 07/15/2017 01:34 PM    ANC 4.40 07/15/2017 01:34 PM    LYMA 20 (L) 07/15/2017 01:34 PM    ALC 1.30 07/15/2017 01:34 PM    MONA 8 07/15/2017 01:34 PM    AMC 0.50 07/15/2017 01:34 PM    EOSA 3 07/15/2017 01:34 PM    AEC 0.20 07/15/2017 01:34 PM    BASA 1 07/15/2017 01:34 PM    ABC 0.10 07/15/2017 01:34 PM        Cath Report 4/27:    INTERVENTIONAL DATA:    Lesion:  Mid left circumflex, OM2.   Pre intervention stenosis:  70%.   Post intervention stenosis:  0%.   Pre intervention flow:  TIMI-3.   Post intervention flow:  TIMI-3.   Device used:  2.25 x 28 mm Xience Sierra DES (postdilated with 3.0 x 15 mm NC balloon).   Lesion classification:  B.   Lesion:  Mid RPDA.   Pre intervention stenosis:  80%.   Post intervention stenosis:  0%.   Pre intervention flow:  TIMI-3.   Post intervention flow:  TIMI-3.   Device used:  2.75 x 15 mm Xience Sierra DES.

## 2018-05-25 NOTE — Progress Notes
loading dose of 300 mg x 1 at 1500 today.  -TC 116, DRG 67, HDL 39, LDL 63-continue atorvastatin 40mg  daily    Pseudoaneurysm right femoral artery  -CTA pelvis 4/28 revealed small pseudoaneurysm arising from the anterior right common femoral   artery    -Right groin duplex confirmed pseudoaneurysm, measured at 1.06 x 1.57 cm    Hypertension  -BP has been elevated at times, in 130-180s systolic    Sinus Bradycardia  -On telemetry, is been noted to be in sinus bradycardia with some rates in the 40s to 60s.  He seems to be asymptomatic.  -On metoprolol 50 twice daily but this was held last night and this morning due to hold parameters. Also on methimazole for his hyperthyroidism.  TSH has been running low at was improved and just slightly low in January at 0.32.  -He was given metoprolol 25 mg x 1 around noon for elevated pressures    Paroxysmal Atrial Fibrillation  -Maintaining sinus rhythm, on amiodarone, metoprolol  -Has also been on Eliquis.  He got 1 dose 4/28 AM but on hold due to pseudoaneurysm      Plan  -Manual pressure was held to the patient's right groin earlier today for 20 minutes consistently.  He is on bedrest which will continue overnight.  -A right groin duplex will be repeated in the a.m. on 4/29.  If a persistent pseudoaneurysm is noted, Dr. Mackey Birchwood will consider possible thrombin injection.  N.p.o. after midnight.  Eliquis is currently on hold. Continue Plavix.   -Change metoprolol to 25mg  twice daily due to bradycardia and increase Losartan to 100mg  daily for better BP control    Patient's case was discussed with Dr. Mackey Birchwood  _____________________________________________________________________________    Subjective: Reports he has ambulated in the halls.  He denies chest comfort, dyspnea or right groin pain.    Physical Exam:  Vital Signs: Last Filed In 24 Hours Vital Signs: 24 Hour Range   BP: 145/71 (04/28 1400)  Temp: 36.8 ???C (98.2 ???F) (04/28 0615)  Pulse: 63 (04/28 1400)

## 2018-05-25 NOTE — Progress Notes
If [place], Sent to Staff:            Verne Carrow, RN  05/25/2018

## 2018-05-25 NOTE — Progress Notes
05/25/18 0745   Current Cardiac Procedures/Events   Pre Admit Dx Coronary Artery Disease   PCI 05/24/18   EF 70 %   Risk Factors   Risk Factors Hypertension;Hyperlipidemia   Education   Person Instructed Patient   Patient Barriers To Learning None Noted   Interventions/Teaching Methods Verbal Instructions;Written Materials Provided   Patient Response Verbalized Understanding   Topics Angina and NTG use;Wound Care;Reasons to call your doctor;Home Walking Guidelines;Outpatient Cardiac Rehab Referral Information   Teaching Completed 05/25/18   Heart Resource Manual Given 05/25/18   Comments Pt interested in attending OPCR at [facility]. He has attended here before. Educated that d/t COVID19, start date will likely be delayed. He states understanding. Will provide referral.    Outpatient Cardiopulmonary Rehab   OPCR Yes   Location [name]   Date Faxed 05/25/18

## 2018-05-26 ENCOUNTER — Encounter: Admit: 2018-05-26 | Discharge: 2018-05-26 | Payer: MEDICARE

## 2018-05-26 DIAGNOSIS — I350 Nonrheumatic aortic (valve) stenosis: Principal | ICD-10-CM

## 2018-05-26 LAB — POC ACTIVATED CLOTTING TIME
Lab: 251 s
Lab: 297 s
Lab: 301 s

## 2018-05-26 MED ORDER — METOPROLOL TARTRATE 25 MG PO TAB
25 mg | Freq: Two times a day (BID) | ORAL | 0 refills | Status: DC
Start: 2018-05-26 — End: 2018-05-27
  Administered 2018-05-26 – 2018-05-27 (×2): 25 mg via ORAL

## 2018-05-26 NOTE — Progress Notes
Cardiology Daily Progress Note    Joel Hernandez             Admission Date:  05/24/2018                     Assessment:    Principal Problem:    Aortic stenosis  Active Problems:    Hypertension    Obstructive sleep apnea    Paroxysmal atrial fibrillation (HCC)    Coronary artery disease due to calcified coronary lesion    HLD (hyperlipidemia)    Severe aortic stenosis    Coronary artery disease due to lipid rich plaque    Pseudoaneurysm of right femoral artery (HCC)    78 y.o.???male with coronary artery disease with stenting of his LAD in 2017, hypertension, hyperlipidemia, hyperthyroidism, paroxysmal atrial fibrillation and obstructive sleep apnea.  He also has aortic stenosis which Dr. Barry Dienes has been following.  His last echocardiogram in January revealed progressive AS and he has recently been noting increased weakness and chest discomfort and wanted to proceed with TAVR work-up.  He was admitted for cardiac catheterization and underwent PCI.  As part of his TAVR work-up, he had a CTA which incidentally revealed a right femoral artery pseudoaneurysm, so the patient will be kept again overnight for further evaluation.    Aortic Stenosis  -Echocardiogram 02/[birth year]1928 revealed EF 70% with grade 2 diastolic dysfunction, mild LVH, aortic valve area 1.21, mean gradient 43 mmHg and peak velocity 4.4 m/s with a velocity ratio of 0.3.  -CTA chest today suggested possible aortic paravalvular pseudoaneurysm-Dr. Mackey Birchwood who reviewed with radiologist and not felt to be concerning    Coronary disease  -hx stenting of LAD in 2017  -pre TAVR cardiac catheterization 05/24/18 revealed a flow limiting stenosis of the mid circumflex, into the OM2. The mid right posterior descending artery had an 80% stenosis. Both the circumflex and right PDA were treated with drug eluting stents.  LAD stent was patent.  -He was initially loaded with Brilinta 180 mg at the time of his PCI and Height: 185.4 cm (6' 1) (04/29 0823) BP: (134-147)/(57-91)   Temp:  [36.6 ???C (97.9 ???F)-36.8 ???C (98.3 ???F)]   Pulse:  [50-63]   Respirations:  [12 PER MINUTE-21 PER MINUTE]   SpO2:  [95 %-98 %]           Physical Exam   General Appearance: appears stated age, no acute distress   Neck Veins: neck veins are not distended   Lungs: clear to auscultation, no rales or rhonchi, no wheezing   Cardiac Auscultation: regular rhythm, normal rate, no rub, no gallop, grade 2 systolic ejection murmur at RSB    Pedal Pulses: trace palpable DP pulses, 2+ PT symmetric pedal pulses, good capillary refill, feet are warm   Lower Extremity : 2-3+ femoral pulses bilaterally. Right groin is soft, without hematoma or bruit, no lower extremity edema   Abdominal Exam: soft, non-tender, non-distended, normal bowel sounds.   Neurologic Exam: neurological assessment grossly intact      Laboratory Review:   Hematology:    Lab Results   Component Value Date    HGB 12.7 05/26/2018    HCT 37.3 05/26/2018    PLTCT 140 05/26/2018    WBC 8.4 05/26/2018    NEUT 68 07/15/2017    ANC 4.40 07/15/2017    ALC 1.30 07/15/2017    MONA 8 07/15/2017    AMC 0.50 07/15/2017    ABC 0.10 07/15/2017    MCV  92.2 05/26/2018    MCHC 34.0 05/26/2018    MPV 10.0 05/26/2018    RDW 14.5 05/26/2018    and General Chemistry:    Lab Results   Component Value Date    NA 137 05/25/2018    K 3.8 05/25/2018    CL 105 05/25/2018    GAP 9 05/25/2018    BUN 15 05/25/2018    CR 0.99 05/25/2018    GLU 86 05/25/2018    CA 8.6 05/25/2018    ALBUMIN 3.5 02/16/2018    MG 2.0 04/01/2016    TOTBILI 0.90 02/16/2018       Telemetry: sinus bradycardia, rates mid 40s-60s    CTA chest/Abdomen/pelvis 05/25/2018  CHEST:  1. Complicated mildly contrast opacifying fluid collection along the   posterior aortic valve, likely arising from the left coronary cusp,   compatible with paravalvular pseudoaneurysm.  2. Aortic valvular thickening and calcification. Coronary artery   calcification and stenting.

## 2018-05-27 ENCOUNTER — Ambulatory Visit: Admit: 2018-05-24 | Discharge: 2018-05-24 | Payer: MEDICARE

## 2018-05-27 ENCOUNTER — Encounter: Admit: 2018-05-26 | Discharge: 2018-05-27 | Payer: MEDICARE

## 2018-05-27 ENCOUNTER — Encounter: Admit: 2018-05-27 | Discharge: 2018-05-27 | Payer: MEDICARE

## 2018-05-27 ENCOUNTER — Observation Stay: Admit: 2018-05-27 | Discharge: 2018-05-28 | Payer: MEDICARE

## 2018-05-27 ENCOUNTER — Encounter: Admit: 2018-05-24 | Discharge: 2018-05-27 | Payer: MEDICARE

## 2018-05-27 ENCOUNTER — Observation Stay: Admit: 2018-05-24 | Discharge: 2018-05-27 | Payer: MEDICARE

## 2018-05-27 ENCOUNTER — Encounter: Admit: 2018-05-24 | Discharge: 2018-05-24 | Payer: MEDICARE

## 2018-05-27 ENCOUNTER — Encounter: Admit: 2018-05-24 | Discharge: 2018-05-25 | Payer: MEDICARE

## 2018-05-27 ENCOUNTER — Encounter: Admit: 2018-05-25 | Discharge: 2018-05-25 | Payer: MEDICARE

## 2018-05-27 DIAGNOSIS — R05 Cough: ICD-10-CM

## 2018-05-27 DIAGNOSIS — R001 Bradycardia, unspecified: ICD-10-CM

## 2018-05-27 DIAGNOSIS — I350 Nonrheumatic aortic (valve) stenosis: Principal | ICD-10-CM

## 2018-05-27 DIAGNOSIS — I2584 Coronary atherosclerosis due to calcified coronary lesion: ICD-10-CM

## 2018-05-27 DIAGNOSIS — Z79899 Other long term (current) drug therapy: ICD-10-CM

## 2018-05-27 DIAGNOSIS — Z7901 Long term (current) use of anticoagulants: ICD-10-CM

## 2018-05-27 DIAGNOSIS — Z7902 Long term (current) use of antithrombotics/antiplatelets: ICD-10-CM

## 2018-05-27 DIAGNOSIS — E041 Nontoxic single thyroid nodule: ICD-10-CM

## 2018-05-27 DIAGNOSIS — E782 Mixed hyperlipidemia: ICD-10-CM

## 2018-05-27 DIAGNOSIS — I2510 Atherosclerotic heart disease of native coronary artery without angina pectoris: ICD-10-CM

## 2018-05-27 DIAGNOSIS — Z01810 Encounter for preprocedural cardiovascular examination: ICD-10-CM

## 2018-05-27 DIAGNOSIS — G4733 Obstructive sleep apnea (adult) (pediatric): ICD-10-CM

## 2018-05-27 DIAGNOSIS — Z888 Allergy status to other drugs, medicaments and biological substances status: ICD-10-CM

## 2018-05-27 DIAGNOSIS — I2583 Coronary atherosclerosis due to lipid rich plaque: ICD-10-CM

## 2018-05-27 DIAGNOSIS — I724 Aneurysm of artery of lower extremity: ICD-10-CM

## 2018-05-27 DIAGNOSIS — Z7982 Long term (current) use of aspirin: ICD-10-CM

## 2018-05-27 DIAGNOSIS — Z955 Presence of coronary angioplasty implant and graft: ICD-10-CM

## 2018-05-27 DIAGNOSIS — I252 Old myocardial infarction: ICD-10-CM

## 2018-05-27 DIAGNOSIS — Z87891 Personal history of nicotine dependence: ICD-10-CM

## 2018-05-27 DIAGNOSIS — I480 Paroxysmal atrial fibrillation: ICD-10-CM

## 2018-05-27 DIAGNOSIS — I10 Essential (primary) hypertension: ICD-10-CM

## 2018-05-27 DIAGNOSIS — E0590 Thyrotoxicosis, unspecified without thyrotoxic crisis or storm: ICD-10-CM

## 2018-05-27 LAB — BASIC METABOLIC PANEL: Lab: 135 MMOL/L — ABNORMAL LOW (ref >60)

## 2018-05-27 MED ORDER — CLOPIDOGREL 75 MG PO TAB
75 mg | ORAL_TABLET | Freq: Every day | ORAL | 3 refills | 90.00000 days | Status: AC
Start: 2018-05-27 — End: 2018-05-27

## 2018-05-27 MED ORDER — LOSARTAN 100 MG PO TAB
100 mg | ORAL_TABLET | Freq: Every day | ORAL | 3 refills | 30.00000 days | Status: AC
Start: 2018-05-27 — End: 2018-07-08

## 2018-05-27 MED ORDER — METOPROLOL TARTRATE 50 MG PO TAB
50 mg | ORAL_TABLET | Freq: Two times a day (BID) | ORAL | 3 refills | 90.00000 days | Status: DC
Start: 2018-05-27 — End: 2018-06-11

## 2018-05-27 MED ORDER — METOPROLOL TARTRATE 50 MG PO TAB
50 mg | ORAL_TABLET | Freq: Two times a day (BID) | ORAL | 3 refills | 90.00000 days | Status: AC
Start: 2018-05-27 — End: 2018-05-27

## 2018-05-27 MED ORDER — CLOPIDOGREL 75 MG PO TAB
75 mg | ORAL_TABLET | Freq: Every day | ORAL | 3 refills | 90.00000 days | Status: DC
Start: 2018-05-27 — End: 2018-09-02

## 2018-05-31 ENCOUNTER — Encounter: Admit: 2018-05-31 | Discharge: 2018-05-31 | Payer: MEDICARE

## 2018-06-04 ENCOUNTER — Encounter: Admit: 2018-06-04 | Discharge: 2018-06-04 | Payer: MEDICARE

## 2018-06-04 DIAGNOSIS — I350 Nonrheumatic aortic (valve) stenosis: Principal | ICD-10-CM

## 2018-06-04 MED ORDER — SODIUM CHLORIDE 0.9 % IV SOLP
INTRAVENOUS | 0 refills | Status: CN
Start: 2018-06-04 — End: ?

## 2018-06-04 MED ORDER — LIDOCAINE (PF) 10 MG/ML (1 %) IJ SOLN
.1-2 mL | INTRAMUSCULAR | 0 refills | Status: CN | PRN
Start: 2018-06-04 — End: ?

## 2018-06-04 MED ORDER — ASPIRIN 81 MG PO CHEW
81 mg | Freq: Once | ORAL | 0 refills | Status: CN
Start: 2018-06-04 — End: ?

## 2018-06-04 MED ORDER — METOPROLOL TARTRATE 25 MG PO TAB
12.5 mg | Freq: Once | ORAL | 0 refills | Status: CN
Start: 2018-06-04 — End: ?

## 2018-06-07 ENCOUNTER — Encounter: Admit: 2018-06-07 | Discharge: 2018-06-07 | Payer: MEDICARE

## 2018-06-07 DIAGNOSIS — E079 Disorder of thyroid, unspecified: ICD-10-CM

## 2018-06-07 DIAGNOSIS — I10 Essential (primary) hypertension: Principal | ICD-10-CM

## 2018-06-07 DIAGNOSIS — E785 Hyperlipidemia, unspecified: ICD-10-CM

## 2018-06-07 DIAGNOSIS — Z1159 Encounter for screening for other viral diseases: ICD-10-CM

## 2018-06-07 DIAGNOSIS — R011 Cardiac murmur, unspecified: ICD-10-CM

## 2018-06-07 DIAGNOSIS — I4891 Unspecified atrial fibrillation: ICD-10-CM

## 2018-06-07 DIAGNOSIS — I214 Non-ST elevation (NSTEMI) myocardial infarction: ICD-10-CM

## 2018-06-07 DIAGNOSIS — I2510 Atherosclerotic heart disease of native coronary artery without angina pectoris: ICD-10-CM

## 2018-06-07 DIAGNOSIS — G4730 Sleep apnea, unspecified: ICD-10-CM

## 2018-06-07 DIAGNOSIS — M549 Dorsalgia, unspecified: ICD-10-CM

## 2018-06-07 DIAGNOSIS — I350 Nonrheumatic aortic (valve) stenosis: ICD-10-CM

## 2018-06-07 DIAGNOSIS — I724 Aneurysm of artery of lower extremity: ICD-10-CM

## 2018-06-07 DIAGNOSIS — C44621 Squamous cell carcinoma of skin of unspecified upper limb, including shoulder: ICD-10-CM

## 2018-06-07 DIAGNOSIS — C4490 Unspecified malignant neoplasm of skin, unspecified: ICD-10-CM

## 2018-06-07 DIAGNOSIS — G4733 Obstructive sleep apnea (adult) (pediatric): ICD-10-CM

## 2018-06-07 LAB — COMPREHENSIVE METABOLIC PANEL
Lab: 0.7 mg/dL (ref 0.3–1.2)
Lab: 1.2 mg/dL (ref 0.4–1.24)
Lab: 136 MMOL/L — ABNORMAL LOW (ref 137–147)
Lab: 184 U/L — ABNORMAL HIGH (ref 25–110)
Lab: 21 mg/dL (ref 7–25)
Lab: 28 MMOL/L (ref 21–30)
Lab: 3.8 g/dL (ref 3.5–5.0)
Lab: 35 U/L (ref 7–40)
Lab: 6.8 g/dL (ref 6.0–8.0)
Lab: 9.5 mg/dL (ref 8.5–10.6)

## 2018-06-07 LAB — URINALYSIS MICROSCOPIC REFLEX TO CULTURE

## 2018-06-07 LAB — URINALYSIS DIPSTICK REFLEX TO CULTURE
Lab: NEGATIVE
Lab: NEGATIVE
Lab: NEGATIVE
Lab: NEGATIVE
Lab: NEGATIVE
Lab: NEGATIVE

## 2018-06-07 LAB — PTT (APTT): Lab: 34 s — ABNORMAL HIGH (ref 24.0–36.5)

## 2018-06-07 LAB — PROTIME INR (PT): Lab: 1.2 MMOL/L (ref 0.8–1.2)

## 2018-06-07 LAB — BNP (B-TYPE NATRIURETIC PEPTI): Lab: 84 pg/mL (ref 0–100)

## 2018-06-07 LAB — HEMOGLOBIN A1C: Lab: 5.5 % (ref 4.0–6.0)

## 2018-06-07 NOTE — H&P (View-Only)
Original H&P below was performed and dictated by Myself and Dr. Helen Hashimoto on 05/24/18. All updated pertinent labs and testing have been reviewed.     CTA 4/28    IMPRESSION   CHEST:  1. Complicated mildly contrast opacifying fluid collection along the   posterior aortic valve, likely arising from the left coronary cusp,   compatible with paravalvular pseudoaneurysm.  2. Aortic valvular thickening and calcification. Coronary artery   calcification and stenting.  3. 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  4. Multiple thyroid nodules, including dominant left lower thyroid nodule   with microcalcifications. Thyroid ultrasound should be considered for   further evaluation.    ABDOMEN AND PELVIS:  1. Small pseudoaneurysm arising from the anterior right common femoral   artery access site with thin neck.  2. Calcific atherosclerosis without focal high-grade stenosis, dissection,   or aneurysm.  3. 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  4. Prostatomegaly with mild urinary bladder debris. Correlate with   urinalysis as clinically indicated.  5. Mild periportal, external iliac, and inguinal lymphadenopathy which is   likely reactive, though follow-up CT abdomen/pelvis should be considered   in 6 months to assess for stability.  6. Colonic diverticulosis.    Cath 4/27  CORONARY ANATOMY:    Left main:  Angiographically normal, 0% stenosis.  This vessel bifurcates into the left anterior descending and left circumflex arteries.      Left anterior descending:  The proximal-to-mid circumflex image demonstrates a diffuse area of moderate 30% to 40% disease.  The mid LAD has a previously deployed stent which is widely patent.  The remainder of the distal LAD demonstrates mild plaquing throughout its course until it reaches the apex.      Left circumflex:  The proximal circumflex gives rise to a significant OM Requesting Physician: Dr. Elayne Snare  Consulting Physician: Dr. Sindy Messing  Consult Performed By:    Donita Brooks, APRN-NP     HPI: Joel Hernandez is a 78y.o. Male who presents today for cardiac catheterization as part of TAVR work-up. He has a history of aortic stenosis, coronary artery disease, hypertension, hyperlipidemia, hyperthyroidism, paroxysmal A. Fib (on Eliquis) and obstructive sleep apnea.  He has had a prior PCI with a drug-eluting stent to the LAD in 2017. Patient has been monitored by Dr. Barry Dienes. Last echocardiogram was performed in January, EF 70%. The valve was not well visualized but the mean aortic valve gradient appeared to be 42 mmHg with a velocity 4.4 m/s and a velocity ratio of 0.23.  It appeared that the valve has worsened when compared to the prior study (March 2019). Recently patient has had worsening shortness of air, increased weakness, lightheadedness (3-4 episodes, for one day), and some mild chest discomfort.      Cardiothoracic consultation has been requested to determine if the patient is a surgical candidate for Transcatheter aortic valve replacement versus open aortic valve replacement.Marland Kitchen            CATH FINDINGS:    2 stents placed- Left circumflex and R PA. Previous patent stent to LAD. See full cath report for more details.      Carotid Duplex:   1. Moderate stenosis [50-79%] noted in right proximal internal carotid artery with moderate atheromatous plaque and turbulent flow. Based on end-diastolic velocity and ratio criteria stenosis is likely less than 70%.   2. No other hemodynamically significant (>50%) stenosis measured in the bilateral common carotid and left internal  carotid arteries. Mild atheromatous plaque visualized in left internal carotid artery.  3. There is normal antegrade flow in bilateral vertebral arteries  4. No evidence of proximal subclavian stenosis bilaterally  5. Bilaterally enlarged thyroid gland noted.  Dedicated thyroid ultrasound ??? Smoking status: Former Smoker       Packs/day: 0.50       Years: 15.00       Pack years: 7.50       Types: Cigarettes       Last attempt to quit: 11/20/1969       Years since quitting: 48.5   ??? Smokeless tobacco: Never Used   Substance and Sexual Activity   ??? Alcohol use: No   ??? Drug use: No   ??? Sexual activity: Not on file   Other Topics Concern   ??? Not on file   Social History Narrative   ??? Not on file            ROS:  Constitutional: Positive for malaise/fatigue. Negative for Weight Change and Fevers  Eyes, Ears, Nose And Throat: Negative for Change in vision, Change in Hearing   Cardiovascular: Negative for Chest Pain, Palpitations, Swelling in ankles  Respiratory: Negative for Cough, Shortness of Breath, wheezing  Gastrointestinal: Negative for Nausea, indigestion, Diarrhea, Constipation, Rectal Bleeding   Neurological: Positive for light-headedness. Negative for Headache, Memory problems, Numbness, Muscle Weakness.  Psychological: Negative for depression or anxiety  Musculoskeletal: Negative for Pain or Swelling in joints  Genitourinary: Negative for Pain with urination, Incontinence of urine, urinary frequency    Skin: Negative for any unusual rash  Endocrine: Negative for Any hair skin or nail changes, Unusual hunger or thirst     Physical Exam:  Temp: 36.4 ???C (97.5 ???F) (04/27 4540)  Pulse: 51 (04/27 0907)  Respirations: 12 PER MINUTE (04/27 0907)  BP: 148/74 (04/27 0907)       GENERAL:   A&O x 3, NAD.  HEENT  Head:  Normocephalic   Teeth: Present and in good dentition  NECK  Active ROM: full  Trachea: midline  HEART  Neck Veins- No JVD   Carotid Arteries:  No bruits  Cardiac: RRR with normal S1, S2.  + systolic murmur  LUNGS  Auscultation- breath sounds CTA bilaterally  ABDOMEN  Soft, NT + BS  EXTREMITIES  Edema- No edema  Posterior Tibial- 2+ bil   Dorsalis Pedis- 2+ bil  SKIN:   Normal, without lesions  NEUROLOGIC  Grossly intact            Donita Brooks, APRN-NP  Available on Voalte

## 2018-06-08 ENCOUNTER — Encounter: Admit: 2018-06-08 | Discharge: 2018-06-08 | Payer: MEDICARE

## 2018-06-08 LAB — COVID-19 (SARS-COV-2) PCR

## 2018-06-08 NOTE — H&P (View-Only)
History and Physical Update Note    Name:  Joel Hernandez                          Medical Record Number:  [id number]     Allergies:  Lisinopril  Primary Care Physician: Lavone Orn  Verified    Lab/Radiology/Other Diagnostic Tests:  Hematology:    Lab Results   Component Value Date    HGB 12.9 05/27/2018    HCT 38.7 05/27/2018    PLTCT 149 05/27/2018    WBC 8.3 05/27/2018    NEUT 68 07/15/2017    ANC 4.40 07/15/2017    ALC 1.30 07/15/2017    MONA 8 07/15/2017    AMC 0.50 07/15/2017    ABC 0.10 07/15/2017    MCV 94.0 05/27/2018    MCHC 33.4 05/27/2018    MPV 10.2 05/27/2018    RDW 15.2 05/27/2018   , Coagulation:    Lab Results   Component Value Date    PTT 34.9 06/07/2018    INR 1.2 06/07/2018   , General Chemistry:    Lab Results   Component Value Date    NA 136 06/07/2018    K 4.3 06/07/2018    CL 102 06/07/2018    GAP 6 06/07/2018    BUN 21 06/07/2018    CR 1.24 06/07/2018    GLU 132 06/07/2018    CA 9.5 06/07/2018    ALBUMIN 3.8 06/07/2018    MG 2.0 04/01/2016    TOTBILI 0.7 06/07/2018   , Enzymes:    Lab Results   Component Value Date    AST 35 06/07/2018    ALT 39 06/07/2018    ALKPHOS 184 06/07/2018     Lab Results   Component Value Date    HGBA1C 5.5 06/07/2018      Ref. Range 06/07/2018 12:23   B Type Natriuretic Peptide Latest Ref Range: 0 - 100 PG/ML 84.0          Ref. Range 06/07/2018 12:07   Color,UA Unknown YELLOW   Turbidity,UA Latest Ref Range: CLEAR-CLEAR  CLEAR   Specific Gravity-Urine Latest Ref Range: 1.003 - 1.035  1.013   pH,UA Latest Ref Range: 5.0 - 8.0  6.0   Glucose,UA Latest Ref Range: NEG-NEG  NEG   Ketones,UA Latest Ref Range: NEG-NEG  NEG   Bilirubin,UA Latest Ref Range: NEG-NEG  NEG   Protein,UA Latest Ref Range: NEG-NEG  1+ (A)   Urobilinogen,UA Latest Ref Range: NORM-NORMAL  NORMAL   Blood,UA Latest Ref Range: NEG-NEG  1+ (A)   Nitrite,UA Latest Ref Range: NEG-NEG  NEG   Leukocytes,UA Latest Ref Range: NEG-NEG  NEG   Urine Ascorbic Acid, UA Latest Ref Range: NEG-NEG  NEG WBCs,UA Latest Ref Range: 0 - 2 /HPF 0-2   RBCs,UA Latest Ref Range: 0 - 3 /HPF 0-2   Comment,UA Unknown Criteria for refl...   MucousUA Unknown TRACE   UA Reflex Culture Unknown Criteria for refl...       Last Dose Beta Blockers/Anticoagulants:  Last dose of Beta Blocker:    Date 06/09/18, Last dose of other Anticoagulant  Date 06/04/18, Eliquis, Last dose of Antiplatelet Agent  Date 06/09/18- Plavix      I have examined the patient, and the following changes are noted from the previous H&P performed on 05/24/18.    Type and Cross    Ref. Range 06/07/2018 12:23   Record Check Unknown FOUND   ABO/RH(D)  Unknown O POS   Antibody Screen Unknown NEG   Crossmatch Expires Unknown 06/10/2018,2359   Units Ordered Unknown 2     COVID-19 testing: negative on 5/11.    Plan to admit patient to floor. Coronary CTA today, Dr. Helen Hashimoto will review, possible TAVR tomorrow. Consents are signed and in chart.      Donita Brooks, APRN-NP  Available on Voalte.

## 2018-06-08 NOTE — Progress Notes
Pre-op mtg complete.  Reviewed written instruction packet (A Patient's Guide to Preparing for Surgery).  Instructed pt to be NPO @ 2300 on 5/12; except a sip of water to take ASA and Plavix in am, or any other meds that he was instructed to take by the pharmacist or surgeon???s office.  Pt verified that he took last Eliquis dose on 5/10. Instructed to take CHG 4% shower X 2 as per written instructions, soap provided; and to check in at [facility] admitting at 0645 on 5/13. Pt???s son will accompany him to hospital, and they are aware of the no visitor policy; he will wait in the waiting room during surgery.  Discussed process for pre-op, intra-op and post-op recovery.  Discussed post-op pain, lifting and driving restrictions and importance of mobilization after discharge.  Instructed to call the CTS office if he has any health changes prior to surgery.  Pt verbalized understanding of all instructions. Denies skin and dental issues and denies religious or cultural beliefs that would affect his care.  Pt gave me his dental clearance letter and it was scanned into the chart.  KCCQ complete.  All questions answered to the pt???s satisfaction.  kz

## 2018-06-09 ENCOUNTER — Encounter: Admit: 2018-06-09 | Discharge: 2018-06-09 | Payer: MEDICARE

## 2018-06-09 ENCOUNTER — Inpatient Hospital Stay: Admit: 2018-06-09 | Discharge: 2018-06-09 | Payer: MEDICARE

## 2018-06-09 DIAGNOSIS — R011 Cardiac murmur, unspecified: ICD-10-CM

## 2018-06-09 DIAGNOSIS — E785 Hyperlipidemia, unspecified: ICD-10-CM

## 2018-06-09 DIAGNOSIS — G4730 Sleep apnea, unspecified: ICD-10-CM

## 2018-06-09 DIAGNOSIS — C4490 Unspecified malignant neoplasm of skin, unspecified: ICD-10-CM

## 2018-06-09 DIAGNOSIS — E079 Disorder of thyroid, unspecified: ICD-10-CM

## 2018-06-09 DIAGNOSIS — I10 Essential (primary) hypertension: Principal | ICD-10-CM

## 2018-06-09 DIAGNOSIS — C44621 Squamous cell carcinoma of skin of unspecified upper limb, including shoulder: ICD-10-CM

## 2018-06-09 DIAGNOSIS — I724 Aneurysm of artery of lower extremity: ICD-10-CM

## 2018-06-09 DIAGNOSIS — I350 Nonrheumatic aortic (valve) stenosis: ICD-10-CM

## 2018-06-09 DIAGNOSIS — G4733 Obstructive sleep apnea (adult) (pediatric): ICD-10-CM

## 2018-06-09 DIAGNOSIS — M549 Dorsalgia, unspecified: ICD-10-CM

## 2018-06-09 DIAGNOSIS — I2510 Atherosclerotic heart disease of native coronary artery without angina pectoris: ICD-10-CM

## 2018-06-09 DIAGNOSIS — I4891 Unspecified atrial fibrillation: ICD-10-CM

## 2018-06-09 DIAGNOSIS — I214 Non-ST elevation (NSTEMI) myocardial infarction: ICD-10-CM

## 2018-06-09 LAB — MAGNESIUM: Lab: 1.7 mg/dL (ref 1.6–2.6)

## 2018-06-09 LAB — LACTIC ACID(LACTATE): Lab: 0.5 MMOL/L — ABNORMAL LOW (ref 0.5–2.0)

## 2018-06-09 LAB — CBC AND DIFF
Lab: 0 10*3/uL (ref 0–0.20)
Lab: 0.1 10*3/uL (ref 0–0.45)
Lab: 0.4 10*3/uL (ref 0–0.80)
Lab: 0.9 10*3/uL — ABNORMAL LOW (ref 1.0–4.8)
Lab: 1 % (ref 0–2)
Lab: 12 % — ABNORMAL LOW (ref 24–44)
Lab: 12 g/dL — ABNORMAL LOW (ref 13.5–16.5)
Lab: 134 10*3/uL — ABNORMAL LOW (ref 150–400)
Lab: 14 % (ref 11–15)
Lab: 2 % (ref 0–5)
Lab: 3.9 M/UL — ABNORMAL LOW (ref 4.4–5.5)
Lab: 31 pg (ref 26–34)
Lab: 33 g/dL (ref 32.0–36.0)
Lab: 36 % — ABNORMAL LOW (ref 40–50)
Lab: 6 % (ref 4–12)
Lab: 6.1 10*3/uL (ref 1.8–7.0)
Lab: 7.7 10*3/uL (ref 4.5–11.0)
Lab: 79 % — ABNORMAL HIGH (ref 41–77)
Lab: 9.9 FL (ref 7–11)
Lab: 93 FL (ref 80–100)

## 2018-06-09 MED ORDER — POTASSIUM CHLORIDE 20 MEQ/15 ML PO LIQD
20-40 meq | NASOGASTRIC | 0 refills | Status: CN | PRN
Start: 2018-06-09 — End: ?

## 2018-06-09 MED ORDER — CLOPIDOGREL 75 MG PO TAB
75 mg | Freq: Every day | ORAL | 0 refills | Status: CN
Start: 2018-06-09 — End: ?

## 2018-06-09 MED ORDER — VECURONIUM BROMIDE 10 MG IV SOLR
0 refills | Status: DC
Start: 2018-06-09 — End: 2018-06-09
  Administered 2018-06-09: 13:00:00 5 mg via INTRAVENOUS

## 2018-06-09 MED ORDER — LIDOCAINE (PF) 200 MG/10 ML (2 %) IJ SYRG
0 refills | Status: DC
Start: 2018-06-09 — End: 2018-06-09
  Administered 2018-06-09: 13:00:00 80 mg via INTRAVENOUS

## 2018-06-09 MED ORDER — LIDOCAINE (PF) 10 MG/ML (1 %) IJ SOLN
.1-2 mL | INTRAMUSCULAR | 0 refills | Status: DC | PRN
Start: 2018-06-09 — End: 2018-06-09

## 2018-06-09 MED ORDER — NOREPINEPHRINE BITARTRATE-NACL 4 MG/250 ML (16 MCG/ML) IV SOLN
0-.5 ug/kg/min | INTRAVENOUS | 0 refills | Status: CN
Start: 2018-06-09 — End: ?

## 2018-06-09 MED ORDER — MAGNESIUM SULFATE IN D5W 1 GRAM/100 ML IV PGBK
1 g | INTRAVENOUS | 0 refills | Status: CN | PRN
Start: 2018-06-09 — End: ?

## 2018-06-09 MED ORDER — BISACODYL 10 MG RE SUPP
10 mg | Freq: Every day | RECTAL | 0 refills | Status: CN | PRN
Start: 2018-06-09 — End: ?

## 2018-06-09 MED ORDER — PROCHLORPERAZINE 25 MG RE SUPP
25 mg | RECTAL | 0 refills | Status: CN | PRN
Start: 2018-06-09 — End: ?

## 2018-06-09 MED ORDER — POTASSIUM CHLORIDE 20 MEQ PO TBTQ
20-40 meq | ORAL | 0 refills | Status: CN | PRN
Start: 2018-06-09 — End: ?

## 2018-06-09 MED ORDER — METOPROLOL TARTRATE 25 MG PO TAB
12.5 mg | Freq: Once | ORAL | 0 refills | Status: DC
Start: 2018-06-09 — End: 2018-06-09

## 2018-06-09 MED ORDER — OXYCODONE 5 MG PO TAB
5-10 mg | ORAL | 0 refills | Status: CN | PRN
Start: 2018-06-09 — End: ?

## 2018-06-09 MED ORDER — NICARDIPINE IN NACL (ISO-OS) 20 MG/200 ML IV PGBK
5-15 mg/h | INTRAVENOUS | 0 refills | Status: DC
Start: 2018-06-09 — End: 2018-06-10

## 2018-06-09 MED ORDER — HYDROCODONE-ACETAMINOPHEN 5-325 MG PO TAB
1-2 | Freq: Once | ORAL | 0 refills | Status: DC | PRN
Start: 2018-06-09 — End: 2018-06-09

## 2018-06-09 MED ORDER — SUGAMMADEX 100 MG/ML IV SOLN
INTRAVENOUS | 0 refills | Status: DC
Start: 2018-06-09 — End: 2018-06-09
  Administered 2018-06-09: 14:00:00 200 mg via INTRAVENOUS

## 2018-06-09 MED ORDER — METHIMAZOLE 10 MG PO TAB
10 mg | Freq: Two times a day (BID) | ORAL | 0 refills | Status: CN
Start: 2018-06-09 — End: ?

## 2018-06-09 MED ORDER — HYDRALAZINE 20 MG/ML IJ SOLN
10 mg | INTRAVENOUS | 0 refills | Status: DC | PRN
Start: 2018-06-09 — End: 2018-06-09

## 2018-06-09 MED ORDER — NITROGLYCERIN IN 5 % DEXTROSE 50 MG/250 ML (200 MCG/ML) IV SOLN
.1-3 ug/kg/min | INTRAVENOUS | 0 refills | Status: CN
Start: 2018-06-09 — End: ?

## 2018-06-09 MED ORDER — TAMSULOSIN 0.4 MG PO CAP
.4 mg | Freq: Every evening | ORAL | 0 refills | Status: CN
Start: 2018-06-09 — End: ?

## 2018-06-09 MED ORDER — ATORVASTATIN 40 MG PO TAB
40 mg | Freq: Every day | ORAL | 0 refills | Status: DC
Start: 2018-06-09 — End: 2018-06-11
  Administered 2018-06-10 – 2018-06-11 (×2): 40 mg via ORAL

## 2018-06-09 MED ORDER — ASPIRIN 81 MG PO CHEW
81 mg | Freq: Every day | ORAL | 0 refills | Status: CN
Start: 2018-06-09 — End: ?

## 2018-06-09 MED ORDER — IMS MIXTURE TEMPLATE
15 mg | Freq: Every day | ORAL | 0 refills | Status: DC
Start: 2018-06-09 — End: 2018-06-11
  Administered 2018-06-09 – 2018-06-10 (×4): 15 mg via ORAL

## 2018-06-09 MED ORDER — ESMOLOL 100 MG/10 ML (10 MG/ML) IV SOLN
0 refills | Status: DC
Start: 2018-06-09 — End: 2018-06-09
  Administered 2018-06-09 (×4): 50 mg via INTRAVENOUS

## 2018-06-09 MED ORDER — DEXTRAN 70-HYPROMELLOSE (PF) 0.1-0.3 % OP DPET
0 refills | Status: DC
Start: 2018-06-09 — End: 2018-06-09
  Administered 2018-06-09: 13:00:00 2 [drp] via OPHTHALMIC

## 2018-06-09 MED ORDER — PHENYLEPHRINE 40 MCG/ML IN NS IV DRIP (STD CONC)
0 refills | Status: DC
Start: 2018-06-09 — End: 2018-06-09
  Administered 2018-06-09 (×2): .6 ug/kg/min via INTRAVENOUS

## 2018-06-09 MED ORDER — ATORVASTATIN 40 MG PO TAB
40 mg | Freq: Every day | ORAL | 0 refills | Status: CN
Start: 2018-06-09 — End: ?

## 2018-06-09 MED ORDER — SENNOSIDES-DOCUSATE SODIUM 8.6-50 MG PO TAB
2 | Freq: Two times a day (BID) | ORAL | 0 refills | Status: CN
Start: 2018-06-09 — End: ?

## 2018-06-09 MED ORDER — ONDANSETRON HCL (PF) 4 MG/2 ML IJ SOLN
4 mg | INTRAVENOUS | 0 refills | Status: CN | PRN
Start: 2018-06-09 — End: ?

## 2018-06-09 MED ORDER — SODIUM CHLORIDE 0.9 % IV SOLP
INTRAVENOUS | 0 refills | Status: CN
Start: 2018-06-09 — End: ?

## 2018-06-09 MED ORDER — SODIUM CHLORIDE 0.9 % IV SOLP
INTRAVENOUS | 0 refills | Status: DC
Start: 2018-06-09 — End: 2018-06-09

## 2018-06-09 MED ORDER — ETOMIDATE 2 MG/ML IV SOLN
0 refills | Status: DC
Start: 2018-06-09 — End: 2018-06-09
  Administered 2018-06-09: 13:00:00 16 mg via INTRAVENOUS

## 2018-06-09 MED ORDER — FENTANYL CITRATE (PF) 50 MCG/ML IJ SOLN
0 refills | Status: DC
Start: 2018-06-09 — End: 2018-06-09
  Administered 2018-06-09: 13:00:00 100 ug via INTRAVENOUS

## 2018-06-09 MED ORDER — METHIMAZOLE 10 MG PO TAB
10 mg | Freq: Two times a day (BID) | ORAL | 0 refills | Status: DC
Start: 2018-06-09 — End: 2018-06-09

## 2018-06-09 MED ORDER — ASPIRIN 81 MG PO CHEW
81 mg | Freq: Once | ORAL | 0 refills | Status: CP
Start: 2018-06-09 — End: ?

## 2018-06-09 MED ORDER — NICARDIPINE IN NACL (ISO-OS) 20 MG/200 ML IV PGBK (INFUSION)(AM)(OR)
0 refills | Status: DC
Start: 2018-06-09 — End: 2018-06-09
  Administered 2018-06-09: 13:00:00 5 mg/h via INTRAVENOUS

## 2018-06-09 MED ORDER — CEFAZOLIN INJ 1GM IVP
2 g | INTRAVENOUS | 0 refills | Status: CN
Start: 2018-06-09 — End: ?

## 2018-06-09 MED ORDER — ACETAMINOPHEN 325 MG PO TAB
650 mg | ORAL | 0 refills | Status: CN | PRN
Start: 2018-06-09 — End: ?

## 2018-06-09 MED ORDER — FENTANYL CITRATE (PF) 50 MCG/ML IJ SOLN
25 ug | INTRAVENOUS | 0 refills | Status: DC | PRN
Start: 2018-06-09 — End: 2018-06-09

## 2018-06-09 MED ORDER — NITROGLYCERIN 200MCG SYRINGE (OR)(OSM)
0 refills | Status: DC
Start: 2018-06-09 — End: 2018-06-09
  Administered 2018-06-09 (×2): 50 ug via INTRAVENOUS

## 2018-06-09 MED ORDER — MAGNESIUM HYDROXIDE 2,400 MG/10 ML PO SUSP
10 mL | Freq: Every day | ORAL | 0 refills | Status: CN | PRN
Start: 2018-06-09 — End: ?

## 2018-06-09 MED ORDER — POTASSIUM CHLORIDE IN WATER 10 MEQ/50 ML IV PGBK
10 meq | INTRAVENOUS | 0 refills | Status: CN | PRN
Start: 2018-06-09 — End: ?

## 2018-06-09 MED ORDER — HALOPERIDOL LACTATE 5 MG/ML IJ SOLN
1 mg | Freq: Once | INTRAVENOUS | 0 refills | Status: DC | PRN
Start: 2018-06-09 — End: 2018-06-09

## 2018-06-09 MED ORDER — SUCCINYLCHOLINE CHLORIDE 20 MG/ML IJ SOLN
INTRAVENOUS | 0 refills | Status: DC
Start: 2018-06-09 — End: 2018-06-09
  Administered 2018-06-09: 13:00:00 120 mg via INTRAVENOUS

## 2018-06-09 MED ORDER — NOREPINEPHRINE IV DRIP STD CONC (AM)(OR)
0 refills | Status: DC
Start: 2018-06-09 — End: 2018-06-09
  Administered 2018-06-09: 13:00:00 .03 ug/kg/min via INTRAVENOUS

## 2018-06-09 MED ORDER — HYDRALAZINE 20 MG/ML IJ SOLN
10 mg | INTRAVENOUS | 0 refills | Status: CN | PRN
Start: 2018-06-09 — End: ?

## 2018-06-09 MED ORDER — METHIMAZOLE 10 MG PO TAB
10 mg | Freq: Every morning | ORAL | 0 refills | Status: DC
Start: 2018-06-09 — End: 2018-06-11
  Administered 2018-06-10 – 2018-06-11 (×2): 10 mg via ORAL

## 2018-06-09 MED ORDER — TAMSULOSIN 0.4 MG PO CAP
.4 mg | Freq: Every evening | ORAL | 0 refills | Status: DC
Start: 2018-06-09 — End: 2018-06-11
  Administered 2018-06-10 – 2018-06-11 (×2): 0.4 mg via ORAL

## 2018-06-09 MED ORDER — ELECTROLYTE-A IV SOLP
0 refills | Status: DC
Start: 2018-06-09 — End: 2018-06-09
  Administered 2018-06-09: 13:00:00 via INTRAVENOUS

## 2018-06-09 MED ADMIN — ASPIRIN 81 MG PO CHEW [680]: 81 mg | ORAL | @ 12:00:00 | Stop: 2018-06-09 | NDC 63739043401

## 2018-06-09 MED ADMIN — SODIUM CHLORIDE 0.9 % IV SOLP [27838]: INTRAVENOUS | @ 11:00:00 | Stop: 2018-06-09 | NDC 00338004904

## 2018-06-09 MED ADMIN — LIDOCAINE (PF) 10 MG/ML (1 %) IJ SOLN [95838]: 2 mL | INTRAMUSCULAR | @ 12:00:00 | Stop: 2018-06-09 | NDC 63323049227

## 2018-06-09 NOTE — Anesthesia Procedure Notes
Anesthesia Procedure: Central Venous Catheter Line    CENTRAL LINE INSERTION  Date/Time: 06/09/2018 9:12 AM    Patient location: Pre/Post  Indications: medications requiring CV access, other and vascular access    Preprocedure checklist performed: 2 patient identifiers, risks & benefits discussed, patient evaluated, timeout performed, consent obtained, patient being monitored and CVC bundle followed (proper hand washing, maximal sterile barrier technique with cap, sterile gown, sterile glove, sterile drape, and skin prep for antisepsis)        CVC Line Insertion Procedure  Skin prepped with chlorhexidine; skin prep agent completely dried prior to procedure.  Patient Position: Trendelenburg  Location: internal jugular vein  Laterality: right  Vein identification: ultrasound guided  Confirmation of venous placement prior to dilation of vein by: ultrasound  Ultrasound image captured  Number of attempts: 1  Successful placement: yes  Patient sedated: yes      Catheter:     Catheter type: introducer placed using standard wire through needle technique     Catheter size: 7 Fr     Insertion depth: 10 cm      Procedure Outcome  Post procedure: all ports aspirated, dressing applied and line sutured; Dressing: sterile occlusive dressing  Events: none  Observations: patient tolerated well        Performed by: Michelle Nasuti, MD  Authorized by: Michelle Nasuti, MD

## 2018-06-09 NOTE — Progress Notes
Patient arrived to room # [facility] 402) via bed accompanied by RN. Patient transferred to the bed with assistance. Bedside safety checks completed. Initial patient assessment completed. Refer to flowsheet for details.    Admission skin assessment completed with: Mady, RN    Pressure injury present on arrival?: No    1. Head/Face/Neck: No  2. Trunk/Back: No  3. Upper Extremities: No  4. Lower Extremities: No  5. Pelvic/Coccyx: No  6. Assessed for device associated injury? Yes  7. Malnutrition Screening Tool (Nursing Nutrition Assessment) Completed? Yes    See Doc Flowsheet for additional wound details.     INTERVENTIONS:     No interventions required.

## 2018-06-09 NOTE — Progress Notes
Procedure cancelled.  Pt from OR with anesth, OR RN, and cath lab.   Attached to monitor, pt on RA, saturation 98%.  Susa Loffler, PA,  notified of patient arrival.  Pt lethargic, orientated x4, MAE.  No pain or nausea stated.   Will cont to monitor closely.

## 2018-06-09 NOTE — Progress Notes
Assumed care at 1230. VSS. A/Ox4. SB on telemetry. R neck puncture site c/d/i. UOP adequate. -BM this shift. Assist x 1. No other issues.

## 2018-06-10 ENCOUNTER — Encounter: Admit: 2018-06-10 | Discharge: 2018-06-10 | Payer: MEDICARE

## 2018-06-10 DIAGNOSIS — T148XXA Other injury of unspecified body region, initial encounter: Principal | ICD-10-CM

## 2018-06-10 DIAGNOSIS — I350 Nonrheumatic aortic (valve) stenosis: Principal | ICD-10-CM

## 2018-06-10 LAB — CBC
Lab: 10 FL (ref >60)
Lab: 12 g/dL — ABNORMAL LOW (ref 13.5–16.5)
Lab: 124 K/UL — ABNORMAL LOW (ref >60)
Lab: 15 % (ref 11–15)
Lab: 3.8 M/UL — ABNORMAL LOW (ref 4.4–5.5)
Lab: 31 pg (ref 26–34)
Lab: 33 g/dL — ABNORMAL HIGH (ref 32.0–36.0)
Lab: 36 % — ABNORMAL LOW (ref 40–50)
Lab: 7.5 K/UL — AB (ref 4.5–11.0)
Lab: 93 FL — ABNORMAL HIGH (ref 80–100)

## 2018-06-10 LAB — MAGNESIUM: Lab: 1.8 mg/dL (ref 1.6–2.6)

## 2018-06-10 LAB — BASIC METABOLIC PANEL: Lab: 138 MMOL/L (ref 137–147)

## 2018-06-10 MED ORDER — NITROGLYCERIN 400 MCG/SPRAY TL SPRY
1-2 | 0 refills | Status: DC | PRN
Start: 2018-06-10 — End: 2018-06-11
  Administered 2018-06-10: 18:00:00 1

## 2018-06-10 MED ORDER — LOSARTAN 50 MG PO TAB
100 mg | Freq: Every day | ORAL | 0 refills | Status: DC
Start: 2018-06-10 — End: 2018-06-11
  Administered 2018-06-10 – 2018-06-11 (×2): 100 mg via ORAL

## 2018-06-10 MED ORDER — SODIUM CHLORIDE 0.9 % IV SOLP
250 mL | INTRAVENOUS | 0 refills | Status: DC | PRN
Start: 2018-06-10 — End: 2018-06-11

## 2018-06-10 MED ORDER — MAGNESIUM SULFATE IN D5W 1 GRAM/100 ML IV PGBK
1 g | INTRAVENOUS | 0 refills | Status: DC | PRN
Start: 2018-06-10 — End: 2018-06-11

## 2018-06-10 MED ORDER — SODIUM CHLORIDE 0.9 % IV SOLP
250 mL | INTRAVENOUS | 0 refills | Status: DC
Start: 2018-06-10 — End: 2018-06-11

## 2018-06-10 MED ORDER — DIPHENHYDRAMINE HCL 50 MG/ML IJ SOLN
50 mg | Freq: Once | INTRAVENOUS | 0 refills | Status: AC | PRN
Start: 2018-06-10 — End: ?

## 2018-06-10 MED ORDER — METOPROLOL TARTRATE 5 MG/5 ML IV SOLN
5 mg | INTRAVENOUS | 0 refills | Status: DC | PRN
Start: 2018-06-10 — End: 2018-06-11

## 2018-06-10 MED ORDER — METHYLPREDNISOLONE SOD SUC(PF) 125 MG/2 ML IJ SOLR
125 mg | Freq: Once | INTRAVENOUS | 0 refills | Status: AC | PRN
Start: 2018-06-10 — End: ?

## 2018-06-10 MED ORDER — CLOPIDOGREL 75 MG PO TAB
75 mg | Freq: Every day | ORAL | 0 refills | Status: DC
Start: 2018-06-10 — End: 2018-06-11
  Administered 2018-06-10 – 2018-06-11 (×2): 75 mg via ORAL

## 2018-06-10 MED ORDER — IVABRADINE 7.5 MG PO TAB
15 mg | Freq: Once | ORAL | 0 refills | Status: AC | PRN
Start: 2018-06-10 — End: ?

## 2018-06-10 MED ORDER — POTASSIUM CHLORIDE 20 MEQ/15 ML PO LIQD
40-60 meq | NASOGASTRIC | 0 refills | Status: DC | PRN
Start: 2018-06-10 — End: 2018-06-11

## 2018-06-10 MED ORDER — METOPROLOL TARTRATE 25 MG PO TAB
25 mg | Freq: Two times a day (BID) | ORAL | 0 refills | Status: DC
Start: 2018-06-10 — End: 2018-06-11
  Administered 2018-06-11 (×2): 25 mg via ORAL

## 2018-06-10 MED ORDER — DIPHENHYDRAMINE HCL 50 MG PO CAP
50 mg | Freq: Once | ORAL | 0 refills | Status: AC | PRN
Start: 2018-06-10 — End: ?

## 2018-06-10 MED ORDER — POTASSIUM CHLORIDE IN WATER 10 MEQ/50 ML IV PGBK
10 meq | INTRAVENOUS | 0 refills | Status: DC | PRN
Start: 2018-06-10 — End: 2018-06-11

## 2018-06-10 MED ORDER — SODIUM CHLORIDE 0.9 % IJ SOLN
100 mL | Freq: Once | INTRAVENOUS | 0 refills | Status: CP
Start: 2018-06-10 — End: ?
  Administered 2018-06-10: 19:00:00 100 mL via INTRAVENOUS

## 2018-06-10 MED ORDER — METOPROLOL TARTRATE 25 MG PO TAB
12.5 mg | Freq: Two times a day (BID) | ORAL | 0 refills | Status: DC
Start: 2018-06-10 — End: 2018-06-10

## 2018-06-10 MED ORDER — MAGNESIUM CHLORIDE 64 MG PO TBER
535 mg | ORAL | 0 refills | Status: DC | PRN
Start: 2018-06-10 — End: 2018-06-11

## 2018-06-10 MED ORDER — NITROGLYCERIN 0.4 MG SL SUBL
.4 mg | SUBLINGUAL | 0 refills | Status: DC | PRN
Start: 2018-06-10 — End: 2018-06-11

## 2018-06-10 MED ORDER — POTASSIUM CHLORIDE 20 MEQ PO TBTQ
40-60 meq | ORAL | 0 refills | Status: DC | PRN
Start: 2018-06-10 — End: 2018-06-11

## 2018-06-10 MED ORDER — IOPAMIDOL 76 % IV SOLN
100 mL | Freq: Once | INTRAVENOUS | 0 refills | Status: CP
Start: 2018-06-10 — End: ?
  Administered 2018-06-10: 19:00:00 100 mL via INTRAVENOUS

## 2018-06-10 NOTE — Discharge Instructions - Pharmacy
Please contact your doctor for the following symptoms:  *Temperature over 100 degrees F  *Uncontrolled pain  *Drainage with a foul odor  *Shortness of breath  *Racing or skipping heart beats  *Swelling or tenderness in your groin  *Painful/Cold/Discolored legs or toes  *Suture material sticking up through your incisions  *Change in coordination of ability to talk  *If you gain more than 2 pounds in 24 hours, or 5 pounds in one week.     Return Post-Op Appointment    We will contact your regarding your follow up appointment.     [place] Provider Earlene Plater (TRIP) L III [[id number]]    Location [facility]      Primary Care Appointment    Call to schedule an appointment with your primary care doctor as needed.     [facility] Provider [place], [place] [[id number]]      Additional Discharge Instructions    You MUST take antibiotics prior to any dental procedures (including cleaning), invasive respiratory tract procedures and invasive skin procedures.  You should NOT have any of these procedure for 3 months after your valve surgery.  This will help to prevent infection to the heart valve.  Contact your primary care provider or cardiologist for an antibiotic prescription when needed.     Questions About Your Stay    For questions or concerns regarding your hospital stay. Call [phone]     Discharging attending physician: Earlene Plater (TRIP) L III [[id number]]      Heart Failure Information    You are at risk for readmission to the hospital because of fluid overload. There are steps you can take to lower your risk:  *Continue your current heart medications. Changes made have been made during your hospital stay.  *Remember to weigh yourself every morning, first thing after you urinate, and write down your weigh. Take this record to your doctor's appointments.  *Chart your symptoms - fatigue, shortness of breath, swelling, etc. Immediately report any worsening.  *Remember to consume no more than 2,000mg  (milligrams) of sodium daily. Referring physicians:  Vanice Sarah, MD   Additional provider(s):        Referral to outpatient cardiac rehab:    Outside referral faxed:    Internal referral sent to:

## 2018-06-10 NOTE — Progress Notes
I reviewed the cardiac CTA from earlier today.  I met with the patient and discussed the results.  This is an extraordinarily uncommon clinical entity.  He has a annular hematoma associated with his PCI on May 26, 2018.  He is clinically asymptomatic.  Notably, he feels much better since his PCI with increased energy.  He has not had chest, back or abdominal pain.    He does remain mildly bradycardic but has been previously documented to be that as well.  We will restart some of his home medicines and observe him overnight and make sure that his heart rate does not become a problem.    In my opinion the size of this hematoma is stable from the TEE exam yesterday.  There is no evidence of flow in the cavity.  Certainly this is an uncommon entity so relying on previous experience or published trials is not possible.  I do think this is a reasonable chance of recovering and stabilizing and not becoming more problematic.  However I do think it will require surveillance.  If his heart rate remained stable after reinitiating his home medication regimen we will likely release him tomorrow.  I will see him back with an outpatient CT scan to evaluate her progress.  Hopefully we will be able to allow this to stabilize and ultimately return for a TAVR in the future.  If it does not appear that is possible from subsequent CT scans we will consider aortic valve replacement or root replacement in an open fashion.  This is been discussed with him at length and he has expressed understanding.  He is comfortable with the plan as stated.    Sindy Messing, M.D.

## 2018-06-10 NOTE — Progress Notes
Component Value Date/Time    NA 138 06/10/2018 03:56 AM    K 3.8 06/10/2018 03:56 AM    CA 8.8 06/10/2018 03:56 AM    CL 105 06/10/2018 03:56 AM    CO2 26 06/10/2018 03:56 AM    GAP 7 06/10/2018 03:56 AM    Lab Results   Component Value Date/Time    BUN 18 06/10/2018 03:56 AM    CR 1.09 06/10/2018 03:56 AM    GLU 81 06/10/2018 03:56 AM        CBC w/Diff    Lab Results   Component Value Date/Time    WBC 7.5 06/10/2018 03:56 AM    RBC 3.85 (L) 06/10/2018 03:56 AM    HGB 12.2 (L) 06/10/2018 03:56 AM    HCT 36.0 (L) 06/10/2018 03:56 AM    MCV 93.5 06/10/2018 03:56 AM    MCH 31.6 06/10/2018 03:56 AM    MCHC 33.8 06/10/2018 03:56 AM    RDW 15.0 06/10/2018 03:56 AM    PLTCT 124 (L) 06/10/2018 03:56 AM    MPV 10.1 06/10/2018 03:56 AM    Lab Results   Component Value Date/Time    NEUT 79 (H) 06/09/2018 10:25 AM    ANC 6.10 06/09/2018 10:25 AM    LYMA 12 (L) 06/09/2018 10:25 AM    ALC 0.94 (L) 06/09/2018 10:25 AM    MONA 6 06/09/2018 10:25 AM    AMC 0.44 06/09/2018 10:25 AM    EOSA 2 06/09/2018 10:25 AM    AEC 0.13 06/09/2018 10:25 AM    BASA 1 06/09/2018 10:25 AM    ABC 0.07 06/09/2018 10:25 AM        Arbutus Leas, PA-C  Pager [phone]  at 06/10/2018 8:19 AM

## 2018-06-11 ENCOUNTER — Encounter: Admit: 2018-06-11 | Discharge: 2018-06-11 | Payer: MEDICARE

## 2018-06-11 ENCOUNTER — Inpatient Hospital Stay: Admit: 2018-06-09 | Discharge: 2018-06-11 | Disposition: A | Discharge status: Home / Self Care | Payer: MEDICARE

## 2018-06-11 ENCOUNTER — Ambulatory Visit: Admit: 2018-06-07 | Discharge: 2018-06-07 | Payer: MEDICARE

## 2018-06-11 ENCOUNTER — Inpatient Hospital Stay: Admit: 2018-06-09 | Discharge: 2018-06-09 | Payer: MEDICARE

## 2018-06-11 ENCOUNTER — Inpatient Hospital Stay: Admit: 2018-06-07 | Discharge: 2018-06-07 | Payer: MEDICARE

## 2018-06-11 ENCOUNTER — Inpatient Hospital Stay: Admit: 2018-06-10 | Discharge: 2018-06-10 | Payer: MEDICARE

## 2018-06-11 DIAGNOSIS — I25110 Atherosclerotic heart disease of native coronary artery with unstable angina pectoris: ICD-10-CM

## 2018-06-11 DIAGNOSIS — G4733 Obstructive sleep apnea (adult) (pediatric): ICD-10-CM

## 2018-06-11 DIAGNOSIS — Z1159 Encounter for screening for other viral diseases: ICD-10-CM

## 2018-06-11 DIAGNOSIS — I77810 Thoracic aortic ectasia: ICD-10-CM

## 2018-06-11 DIAGNOSIS — E042 Nontoxic multinodular goiter: ICD-10-CM

## 2018-06-11 DIAGNOSIS — E785 Hyperlipidemia, unspecified: ICD-10-CM

## 2018-06-11 DIAGNOSIS — Z79899 Other long term (current) drug therapy: ICD-10-CM

## 2018-06-11 DIAGNOSIS — I252 Old myocardial infarction: ICD-10-CM

## 2018-06-11 DIAGNOSIS — R001 Bradycardia, unspecified: ICD-10-CM

## 2018-06-11 DIAGNOSIS — I4510 Unspecified right bundle-branch block: ICD-10-CM

## 2018-06-11 DIAGNOSIS — R161 Splenomegaly, not elsewhere classified: ICD-10-CM

## 2018-06-11 DIAGNOSIS — K766 Portal hypertension: ICD-10-CM

## 2018-06-11 DIAGNOSIS — I480 Paroxysmal atrial fibrillation: ICD-10-CM

## 2018-06-11 DIAGNOSIS — K7460 Unspecified cirrhosis of liver: ICD-10-CM

## 2018-06-11 DIAGNOSIS — N400 Enlarged prostate without lower urinary tract symptoms: ICD-10-CM

## 2018-06-11 DIAGNOSIS — Z87891 Personal history of nicotine dependence: ICD-10-CM

## 2018-06-11 DIAGNOSIS — Z85828 Personal history of other malignant neoplasm of skin: ICD-10-CM

## 2018-06-11 DIAGNOSIS — Z006 Encounter for examination for normal comparison and control in clinical research program: ICD-10-CM

## 2018-06-11 DIAGNOSIS — I97630 Postprocedural hematoma of a circulatory system organ or structure following a cardiac catheterization: ICD-10-CM

## 2018-06-11 DIAGNOSIS — Z7902 Long term (current) use of antithrombotics/antiplatelets: ICD-10-CM

## 2018-06-11 DIAGNOSIS — Z7901 Long term (current) use of anticoagulants: ICD-10-CM

## 2018-06-11 DIAGNOSIS — Z5309 Procedure and treatment not carried out because of other contraindication: ICD-10-CM

## 2018-06-11 DIAGNOSIS — I110 Hypertensive heart disease with heart failure: ICD-10-CM

## 2018-06-11 DIAGNOSIS — I350 Nonrheumatic aortic (valve) stenosis: Principal | ICD-10-CM

## 2018-06-11 DIAGNOSIS — E876 Hypokalemia: ICD-10-CM

## 2018-06-11 DIAGNOSIS — Z955 Presence of coronary angioplasty implant and graft: ICD-10-CM

## 2018-06-11 LAB — CBC: Lab: 6.4 K/UL — ABNORMAL LOW (ref 4.5–11.0)

## 2018-06-11 LAB — BASIC METABOLIC PANEL: Lab: 140 MMOL/L — ABNORMAL LOW (ref >60)

## 2018-06-11 MED ORDER — METOPROLOL TARTRATE 50 MG PO TAB
25 mg | ORAL_TABLET | Freq: Two times a day (BID) | ORAL | 3 refills | 90.00000 days | Status: DC
Start: 2018-06-11 — End: 2018-08-05

## 2018-06-14 ENCOUNTER — Encounter: Admit: 2018-06-14 | Discharge: 2018-06-14 | Payer: MEDICARE

## 2018-06-14 MED ORDER — NITROGLYCERIN 0.4 MG SL SUBL
.4 mg | ORAL_TABLET | SUBLINGUAL | 3 refills | 9.00000 days | Status: DC | PRN
Start: 2018-06-14 — End: 2018-08-05

## 2018-06-15 LAB — CULTURE-BLOOD W/SENSITIVITY

## 2018-06-17 ENCOUNTER — Encounter: Admit: 2018-06-17 | Discharge: 2018-06-17 | Payer: MEDICARE

## 2018-06-29 ENCOUNTER — Ambulatory Visit: Admit: 2018-06-29 | Discharge: 2018-06-29

## 2018-06-29 ENCOUNTER — Encounter: Admit: 2018-06-29 | Discharge: 2018-06-29

## 2018-06-29 DIAGNOSIS — I350 Nonrheumatic aortic (valve) stenosis: Secondary | ICD-10-CM

## 2018-06-29 DIAGNOSIS — E079 Disorder of thyroid, unspecified: Secondary | ICD-10-CM

## 2018-06-29 DIAGNOSIS — I2510 Atherosclerotic heart disease of native coronary artery without angina pectoris: Secondary | ICD-10-CM

## 2018-06-29 DIAGNOSIS — T148XXA Other injury of unspecified body region, initial encounter: Secondary | ICD-10-CM

## 2018-06-29 DIAGNOSIS — C44621 Squamous cell carcinoma of skin of unspecified upper limb, including shoulder: Secondary | ICD-10-CM

## 2018-06-29 DIAGNOSIS — C4490 Unspecified malignant neoplasm of skin, unspecified: Secondary | ICD-10-CM

## 2018-06-29 DIAGNOSIS — 1 ERRONEOUS ENCOUNTER--DISREGARD: Secondary | ICD-10-CM

## 2018-06-29 DIAGNOSIS — I724 Aneurysm of artery of lower extremity: Secondary | ICD-10-CM

## 2018-06-29 DIAGNOSIS — E785 Hyperlipidemia, unspecified: Secondary | ICD-10-CM

## 2018-06-29 DIAGNOSIS — I10 Essential (primary) hypertension: Secondary | ICD-10-CM

## 2018-06-29 DIAGNOSIS — R011 Cardiac murmur, unspecified: Secondary | ICD-10-CM

## 2018-06-29 DIAGNOSIS — I214 Non-ST elevation (NSTEMI) myocardial infarction: Secondary | ICD-10-CM

## 2018-06-29 DIAGNOSIS — G4733 Obstructive sleep apnea (adult) (pediatric): Secondary | ICD-10-CM

## 2018-06-29 DIAGNOSIS — I4891 Unspecified atrial fibrillation: Secondary | ICD-10-CM

## 2018-06-29 DIAGNOSIS — M549 Dorsalgia, unspecified: Secondary | ICD-10-CM

## 2018-06-29 DIAGNOSIS — G4730 Sleep apnea, unspecified: Secondary | ICD-10-CM

## 2018-06-29 MED ORDER — LIDOCAINE (PF) 10 MG/ML (1 %) IJ SOLN
.1-2 mL | INTRAMUSCULAR | 0 refills | Status: CN | PRN
Start: 2018-06-29 — End: ?

## 2018-06-29 MED ORDER — IOHEXOL 350 MG IODINE/ML IV SOLN
80 mL | Freq: Once | INTRAVENOUS | 0 refills | Status: CP
Start: 2018-06-29 — End: ?
  Administered 2018-06-29: 18:00:00 80 mL via INTRAVENOUS

## 2018-06-29 MED ORDER — SODIUM CHLORIDE 0.9 % IJ SOLN
50 mL | Freq: Once | INTRAVENOUS | 0 refills | Status: CP
Start: 2018-06-29 — End: ?
  Administered 2018-06-29: 18:00:00 50 mL via INTRAVENOUS

## 2018-06-29 MED ORDER — ASPIRIN 81 MG PO CHEW
81 mg | Freq: Once | ORAL | 0 refills | Status: CN
Start: 2018-06-29 — End: ?

## 2018-06-29 MED ORDER — SODIUM CHLORIDE 0.9 % IV SOLP
INTRAVENOUS | 0 refills | Status: CN
Start: 2018-06-29 — End: ?

## 2018-06-29 NOTE — Progress Notes
Dr. Azzie Glatter was unable to see him in the office due to emergency surgery. Will f/u with patient once Dr. Azzie Glatter can review his CT scan.   This encounter was created in error. Please disregard.

## 2018-07-06 ENCOUNTER — Ambulatory Visit: Admit: 2018-07-06 | Discharge: 2018-07-07

## 2018-07-06 ENCOUNTER — Encounter: Admit: 2018-07-06 | Discharge: 2018-07-06

## 2018-07-06 ENCOUNTER — Ambulatory Visit: Admit: 2018-07-06 | Discharge: 2018-07-06

## 2018-07-06 DIAGNOSIS — I724 Aneurysm of artery of lower extremity: Secondary | ICD-10-CM

## 2018-07-06 DIAGNOSIS — I5033 Acute on chronic diastolic (congestive) heart failure: Secondary | ICD-10-CM

## 2018-07-06 DIAGNOSIS — I350 Nonrheumatic aortic (valve) stenosis: Secondary | ICD-10-CM

## 2018-07-06 DIAGNOSIS — I4510 Unspecified right bundle-branch block: Secondary | ICD-10-CM

## 2018-07-06 DIAGNOSIS — I10 Essential (primary) hypertension: Secondary | ICD-10-CM

## 2018-07-06 DIAGNOSIS — Z955 Presence of coronary angioplasty implant and graft: Secondary | ICD-10-CM

## 2018-07-06 DIAGNOSIS — D696 Thrombocytopenia, unspecified: Secondary | ICD-10-CM

## 2018-07-06 DIAGNOSIS — G4730 Sleep apnea, unspecified: Secondary | ICD-10-CM

## 2018-07-06 DIAGNOSIS — I2583 Coronary atherosclerosis due to lipid rich plaque: Secondary | ICD-10-CM

## 2018-07-06 DIAGNOSIS — I2510 Atherosclerotic heart disease of native coronary artery without angina pectoris: Secondary | ICD-10-CM

## 2018-07-06 DIAGNOSIS — E785 Hyperlipidemia, unspecified: Secondary | ICD-10-CM

## 2018-07-06 DIAGNOSIS — I480 Paroxysmal atrial fibrillation: Secondary | ICD-10-CM

## 2018-07-06 DIAGNOSIS — I7101 Dissection of thoracic aorta: Secondary | ICD-10-CM

## 2018-07-06 DIAGNOSIS — E7800 Pure hypercholesterolemia, unspecified: Secondary | ICD-10-CM

## 2018-07-06 DIAGNOSIS — M48061 Spinal stenosis, lumbar region without neurogenic claudication: Secondary | ICD-10-CM

## 2018-07-06 DIAGNOSIS — G4733 Obstructive sleep apnea (adult) (pediatric): Secondary | ICD-10-CM

## 2018-07-06 DIAGNOSIS — C4490 Unspecified malignant neoplasm of skin, unspecified: Secondary | ICD-10-CM

## 2018-07-06 DIAGNOSIS — M549 Dorsalgia, unspecified: Secondary | ICD-10-CM

## 2018-07-06 DIAGNOSIS — E079 Disorder of thyroid, unspecified: Secondary | ICD-10-CM

## 2018-07-06 DIAGNOSIS — C44621 Squamous cell carcinoma of skin of unspecified upper limb, including shoulder: Secondary | ICD-10-CM

## 2018-07-06 DIAGNOSIS — I214 Non-ST elevation (NSTEMI) myocardial infarction: Secondary | ICD-10-CM

## 2018-07-06 DIAGNOSIS — I4891 Unspecified atrial fibrillation: Secondary | ICD-10-CM

## 2018-07-06 DIAGNOSIS — R011 Cardiac murmur, unspecified: Secondary | ICD-10-CM

## 2018-07-06 LAB — URINALYSIS DIPSTICK REFLEX TO CULTURE
Lab: NEGATIVE
Lab: NEGATIVE
Lab: NEGATIVE
Lab: NEGATIVE
Lab: NEGATIVE
Lab: NEGATIVE

## 2018-07-06 LAB — COMPREHENSIVE METABOLIC PANEL
Lab: 0.6 mg/dL (ref 0.3–1.2)
Lab: 1.1 mg/dL — ABNORMAL HIGH (ref 0.4–1.24)
Lab: 114 mg/dL — ABNORMAL HIGH (ref 70–100)
Lab: 142 MMOL/L — ABNORMAL LOW (ref 137–147)
Lab: 174 U/L — ABNORMAL HIGH (ref 25–110)
Lab: 3.7 MMOL/L — ABNORMAL LOW (ref 3.5–5.1)
Lab: 3.8 g/dL (ref 3.5–5.0)
Lab: 38 U/L (ref 7–56)
Lab: 6.7 g/dL (ref 6.0–8.0)
Lab: >60 mL/min (ref >60)
Lab: >60 mL/min (ref >60)
Lab: 7 (ref 3–12)

## 2018-07-06 LAB — PROTIME INR (PT): Lab: 1.1 mg/dL (ref 0.8–1.2)

## 2018-07-06 LAB — URINALYSIS MICROSCOPIC REFLEX TO CULTURE

## 2018-07-06 LAB — CBC
Lab: 4.1 M/UL — ABNORMAL LOW (ref 4.4–5.5)
Lab: 6.5 10*3/uL (ref 4.5–11.0)

## 2018-07-06 LAB — BNP (B-TYPE NATRIURETIC PEPTI): Lab: 129 pg/mL — ABNORMAL HIGH (ref 0–100)

## 2018-07-06 LAB — PTT (APTT): Lab: 33 s — ABNORMAL LOW (ref 24.0–36.5)

## 2018-07-06 MED ORDER — ENOXAPARIN 100 MG/ML SC SYRG
100 mg | Freq: Two times a day (BID) | SUBCUTANEOUS | 0 refills | 7.00000 days | Status: DC
Start: 2018-07-06 — End: 2018-08-05

## 2018-07-06 NOTE — Progress Notes
Patient has been scheduled for surgery with Dr. Azzie Glatter. Patient instructed on holding medications prior to surgery, see letter on this date for additional information.

## 2018-07-06 NOTE — Progress Notes
Date of Service: 07/06/2018       Subjective:             Joel Hernandez is a 78 y.o. male.      History of Present Illness  We had the pleasure of seeing Joel Hernandez in clinic today for re-evaluation of their known aortic valve stenosis. Joel Hernandez is a 78 y.o. male who was here to undergo an elective transcatheter aortic valve replacement but this procedure was aborted because the preoperative TEE found an annular hematoma. It was felt this may be related to his recent heart catheterization and PCI placement. He was discharged to home and his Eliquis was held and he was sent home on Plavix.     As part of his workup he had an echo in January of this year that showed:  ??? Technically difficult study  ??? The aortic valve is heavily calcified and although the valve is not ideally visualized structurally, the Doppler parameters (including peak velocity, mean gradient, valve area by continuity equation and dimensionless index) would be consistent with severe stenosis  ??? Hyperdynamic left ventricular function. Hyperdynamic LV systolic function with an ejection fraction in the range of 70%  ??? Grade II (moderate) left ventricular diastolic dysfunction. Elevated left atrial pressure  ??? Mild concentric LV hypertrophy  ??? Estimated Peak Systolic PA Pressure 25 mmHg      He had a heart cath on 05/24/2018:  1. Successful drug-eluting stent percutaneous coronary intervention of mid left circumflex into obtuse marginal 2 with 2.25 x 28 mm Xience Sierra drug-eluting stent.  2. Successful drug-eluting stent percutaneous coronary intervention of mid right posterior descending artery with 2.75 x 15 mm Xience Sierra drug-eluting stent.    PFTs that same day showed an FEV1 of 82% predicted and a DLCO of 83% predicted    He had a carotid duplex on 05/24/18 that showed:  1. Moderate stenosis [50-79%] noted in right proximal internal carotid artery with moderate atheromatous plaque and turbulent flow. Based on end-diastolic velocity and ratio criteria stenosis is likely less than 70%.   2. No other hemodynamically significant (>50%) stenosis measured in the bilateral common carotid and left internal carotid arteries. Mild atheromatous plaque visualized in left internal carotid artery.  3. There is normal antegrade flow in bilateral vertebral arteries  4. No evidence of proximal subclavian stenosis bilaterally  5. Bilaterally enlarged thyroid gland noted.  Dedicated thyroid ultrasound is recommended if clinically indicated.    CT scan on 06/29/18 showed:  1. ???Increase in size of a perivalvular pseudoaneurysm posterior to the   aortic root, likely arising from the left coronary cusp. Clinical and   laboratory correlation for signs of infection is recommended as a   perivalvular abscess often also has this appearance.  2. 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   No thoracic aortic dissection. Aortic valvular thickening and severe   calcification in keeping with known aortic stenosis.   3. ???Stable nodular enlargement of the thyroid with substernal extension.   Thyroid ultrasound is again recommended if not recently performed.  4. ???Cirrhosis and mild splenomegaly.  5. 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 These findings have been described in the asymptomatic   elderly population but may also be seen in early interstitial lung disease   such as usual interstitial pneumonia (UIP). Consider correlation with   pulmonary function tests and patient symptoms that may be associated with  interstitial lung disease.    Overall his symptoms have included worsening fatigue, SOB with stairs, and generalized weakness that has continued to worsen. He denies chest pain, palpitations, syncope, orthopnea, paroxysmal nocturnal dyspnea, lower extremity edema, chills, fever, myalgias, nonproductive cough, productive cough and sweats.  He also denies any history of MI, CVA, TIA, cancer, blood clots, or blood clotting disorders. He denies any allergy to nickel. Denies current rash on torso or groin area. Denies any reason or religious belief that would impact their care such as receiving blood, tissue, or animal products. They currently follow routinely with their dentist and do not have any known caries or concerns for infection.     He is here today to discuss possible surgical interventions.     This was all reviewed today by Dr. Sindy Messing III and was discussed at length with Adolm Joseph.            Review of Systems   Constitution: Positive for malaise/fatigue.   HENT: Negative.    Eyes: Negative.    Cardiovascular: Negative.    Respiratory: Negative.    Endocrine: Negative.    Hematologic/Lymphatic: Negative.    Skin: Negative.    Musculoskeletal: Positive for muscle weakness.        Pt. Feels like he is getting weaker.     Gastrointestinal: Negative.    Genitourinary: Negative.    Neurological: Positive for light-headedness.   Psychiatric/Behavioral: Negative.    Allergic/Immunologic: Negative.    All other systems reviewed and are negative.    Medical History:   Diagnosis Date   ??? Aortic stenosis    ??? Atrial fibrillation (HCC)    ??? Back pain    ??? CAD (coronary artery disease)    ??? Disorder of thyroid gland    ??? Heart murmur 09/15/2013   ??? Hyperlipidemia    ??? Hypertension 09/15/2013   ??? NSTEMI (non-ST elevated myocardial infarction) (HCC) 12/2015    s/p stent placement   ??? OSA on CPAP     No CPAP use after 55lb weight loss   ??? 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 SCC (squamous cell carcinoma), arm    ??? Skin cancer    ??? Sleep apnea      Surgical History:   Procedure Laterality Date   ??? LUMBAR LAMINECTOMY  2004    L4-5   ??? LUMBAR LAMINECTOMY  2011    Hemilaminectomies L 2-3 L3-4 ??? RIGHT LUMBAR 4-5 REDO LAMINECTOMY AND LATERAL RECESS DECOMPRESSION Right 12/04/2016    Performed by Theotis Barrio, MD at [facility]   ??? LAMINECTOMY/ FACETECTOMY/ FORAMINOTOMY WITH DECOMPRESSION - 1 VERTEBRAL SEGMENT - LUMBAR  12/04/2016    Performed by Theotis Barrio, MD at [facility]   ??? CORONARY STENT PLACEMENT  12/2016   ??? KNEE REPLACEMENT Right 08/2017   ??? ANGIOGRAPHY CORONARY ARTERY WITH LEFT HEART CATHETERIZATION N/A 05/24/2018    Performed by Marcell [facility]low[facility] at [facility] CATH LAB   ??? POSSIBLE PERCUTANEOUS CORONARY STENT PLACEMENT WITH ANGIOPLASTY N/A 05/24/2018    Performed by [facility] at [facility] CATH LAB   ??? CLOSURE PSEUDOANEURYSM BY INJECTION- LOWER EXTREMITY N/A 05/26/2018    Performed by [facility] at [facility] CATH LAB   ??? CORONARY STENT PLACEMENT  05/2018    x2   ??? COLONOSCOPY     ??? HEART CATHETERIZATION  01/2016, 05/2018  Stent x 1 LAD placement following NSTEMI, 2 stents placed 04/2018   ??? HX HEART CATHETERIZATION     ??? SKIN CANCER EXCISION  2017, 2018    SCC Arm      Social History     Socioeconomic History   ??? Marital status: Married     Spouse name: Not on file   ??? Number of children: Not on file   ??? Years of education: Not on file   ??? Highest education level: Not on file   Occupational History   ??? Not on file   Tobacco Use   ??? Smoking status: Former Smoker     Packs/day: 0.50     Years: 15.00     Pack years: 7.50     Types: Cigarettes     Last attempt to quit: 11/20/1969     Years since quitting: 48.6   ??? Smokeless tobacco: Never Used   Substance and Sexual Activity   ??? Alcohol use: No   ??? Drug use: Never   ??? Sexual activity: Not on file   Other Topics Concern   ??? Not on file   Social History Narrative   ??? Not on file     Family History   Problem Relation Age of Onset   ??? Heart Disease Mother        Objective:         ??? atorvastatin (LIPITOR) 40 mg tablet Take one tablet by mouth daily.   ??? clopiDOGrel (PLAVIX) 75 mg tablet Take one tablet by mouth daily. ??? losartan (COZAAR) 100 mg tablet Take one tablet by mouth daily.   ??? methIMAzole (TAPAZOLE) 10 mg tablet Take  by mouth twice daily. Take 1 tablet in the morning and 1.5 tablets in the evening   ??? metoprolol tartrate (LOPRESSOR) 50 mg tablet Take one-half tablet by mouth twice daily.   ??? nitroglycerin (NITROSTAT) 0.4 mg tablet Place one tablet under tongue every 5 minutes as needed for Chest Pain. Max of 3 tablets, call 911.   ??? tamsulosin (FLOMAX) 0.4 mg capsule Take 1 capsule by mouth at bedtime daily.     Vitals:    07/06/18 1322   BP: (!) 146/70   BP Source: Arm, Right Upper   Pulse: 71   Temp: 36.7 ???C (98 ???F)   SpO2: 99%   Weight: 93.8 kg (206 lb 11.2 oz)   Height: 1.829 m (6')   PainSc: Zero     Body mass index is 28.03 kg/m???.     Physical Exam  Constitutional:       Appearance: Normal appearance. He is normal weight.   Cardiovascular:      Rate and Rhythm: Normal rate and regular rhythm.      Pulses: Normal pulses.      Heart sounds: Murmur present.   Pulmonary:      Effort: Pulmonary effort is normal. No respiratory distress.      Breath sounds: Normal breath sounds.   Abdominal:      General: Abdomen is flat.      Palpations: Abdomen is soft.   Musculoskeletal: Normal range of motion.         General: No swelling.   Skin:     General: Skin is warm and dry.   Neurological:      Mental Status: He is alert and oriented to person, place, and time.   Psychiatric:         Mood and Affect: Mood normal.  Behavior: Behavior normal.         Thought Content: Thought content normal.         Judgment: Judgment normal.         Assessment:  1. Nonrheumatic aortic valve stenosis    2. Acute on chronic diastolic CHF (congestive heart failure), NYHA class 2 (HCC)    3. Intramural hematoma of thoracic aorta (HCC)    4. Paroxysmal atrial fibrillation (HCC)    5. Coronary artery disease due to lipid rich plaque    6. S/p bare metal coronary artery stent    7. Pseudoaneurysm of right femoral artery (HCC) 8. Thrombocytopenia (HCC)    9. RBBB    10. Essential hypertension    11. Pure hypercholesterolemia    12. Spinal stenosis at L4-L5 level            We will have him undergo an AVR/Root replacement on 07/21/18. He will hold his Plavix for 1 week and we will bridge with Lovenox starting on 6/20.       Deliah Goody, APRN  Thoracic & Cardiovascular Surgery  [phone]    07/06/2018              Assessment and Plan:  I had the pleasure of seeing Mr. Kwolek for ongoing evaluation of his severe symptomatic aortic stenosis.  As you may recall, he is a very pleasant 78 year old gentleman who is been previously evaluated and was scheduled for TAVR.  At the time of TAVR TEE demonstrated a pseudoaneurysm of his aortic annulus that appeared to be based off around the left coronary sinus.  He had had previous stenting during his TAVR evaluation that was a technically difficult procedure.  We subsequently performed a follow-up CTA which demonstrated that the pseudoaneurysm had increased slightly in size.    I reviewed these findings with him today in the office.  This is a very uncommon clinical entity.  In my opinion I think this likely could be written out over several months and it would likely resolve.  However given that he is profoundly symptomatic from his aortic stenosis and he needs intervention, I do not think TAVR is wise.  I am concerned that this may lead to annular disruption of an emergent conversion to an open procedure.  I discussed elective replacement his aortic valve and likely aortic root replacement.    We specifically discussed the risks of bleeding, infection, stroke, dialysis and death.  We also discussed a 4% incidence of needing a permanent pacemaker afterwards.  He has expressed understanding and is willing to proceed.  We will hold his antiplatelet regimen will be before the procedure and bridge with Lovenox.  I appreciate the opportunity participate in the care of this nice gentleman.  We will keep you updated on his progress.    Sindy Messing, M.D.

## 2018-07-07 ENCOUNTER — Encounter: Admit: 2018-07-23 | Discharge: 2018-07-23

## 2018-07-07 ENCOUNTER — Encounter: Admit: 2018-07-07 | Discharge: 2018-07-07

## 2018-07-07 DIAGNOSIS — M48061 Spinal stenosis, lumbar region without neurogenic claudication: Secondary | ICD-10-CM

## 2018-07-07 DIAGNOSIS — J90 Pleural effusion, not elsewhere classified: Secondary | ICD-10-CM

## 2018-07-07 DIAGNOSIS — I350 Nonrheumatic aortic (valve) stenosis: Principal | ICD-10-CM

## 2018-07-07 DIAGNOSIS — E876 Hypokalemia: Secondary | ICD-10-CM

## 2018-07-07 DIAGNOSIS — Z952 Presence of prosthetic heart valve: Secondary | ICD-10-CM

## 2018-07-07 DIAGNOSIS — E7800 Pure hypercholesterolemia, unspecified: Secondary | ICD-10-CM

## 2018-07-07 DIAGNOSIS — Z955 Presence of coronary angioplasty implant and graft: Secondary | ICD-10-CM

## 2018-07-07 DIAGNOSIS — E872 Acidosis: Secondary | ICD-10-CM

## 2018-07-07 DIAGNOSIS — I719 Aortic aneurysm of unspecified site, without rupture: Secondary | ICD-10-CM

## 2018-07-07 DIAGNOSIS — D696 Thrombocytopenia, unspecified: Secondary | ICD-10-CM

## 2018-07-07 DIAGNOSIS — Z950 Presence of cardiac pacemaker: Secondary | ICD-10-CM

## 2018-07-07 DIAGNOSIS — I7101 Dissection of thoracic aorta: Secondary | ICD-10-CM

## 2018-07-07 DIAGNOSIS — R578 Other shock: Secondary | ICD-10-CM

## 2018-07-07 LAB — HEMOGLOBIN A1C: Lab: 5.2 % (ref 4.0–6.0)

## 2018-07-08 ENCOUNTER — Encounter: Admit: 2018-07-08 | Discharge: 2018-07-08

## 2018-07-08 MED ORDER — LOSARTAN 100 MG PO TAB
100 mg | ORAL_TABLET | Freq: Every day | ORAL | 3 refills | 30.00000 days | Status: DC
Start: 2018-07-08 — End: 2018-07-12

## 2018-07-12 ENCOUNTER — Encounter: Admit: 2018-07-12 | Discharge: 2018-07-12

## 2018-07-12 MED ORDER — LOSARTAN 100 MG PO TAB
100 mg | ORAL_TABLET | Freq: Every day | ORAL | 3 refills | 90.00000 days | Status: DC
Start: 2018-07-12 — End: 2018-08-05

## 2018-07-19 ENCOUNTER — Ambulatory Visit: Admit: 2018-07-19 | Discharge: 2018-07-19

## 2018-07-19 ENCOUNTER — Encounter: Admit: 2018-07-19 | Discharge: 2018-07-19

## 2018-07-19 DIAGNOSIS — I350 Nonrheumatic aortic (valve) stenosis: Principal | ICD-10-CM

## 2018-07-19 NOTE — Progress Notes
Patient arrived to [facility] for COVID-19 testing 07/19/18 1032. Patient identity confirmed via photo I.D. Nasopharyngeal procedure explained to the patient.   Nasopharyngeal swab completed left  Patient education provided given and instructed patient self isolate until contacted w/ results and further instructions.   Swab collected by Cherly Hensen.    Date symptoms began/reason for testing: Pre-op

## 2018-07-20 ENCOUNTER — Encounter: Admit: 2018-07-20 | Discharge: 2018-07-20

## 2018-07-20 DIAGNOSIS — I350 Nonrheumatic aortic (valve) stenosis: Principal | ICD-10-CM

## 2018-07-20 LAB — COVID-19 (SARS-COV-2) PCR

## 2018-07-20 NOTE — H&P (View-Only)
The original H&P below was performed and dictated by Casimer Lanius APRN-C and Dr. Sindy Messing.  There are no significant changes since he was seen in the clinic.      Aurea Graff PA-C  [id number]    Date of Service: 07/06/2018   ???  ???  Subjective:          Joel Hernandez is a 78 y.o. male.  ???  ???  History of Present Illness  We had the pleasure of seeing Joel Hernandez in clinic today for re-evaluation of their known aortic valve stenosis. Joel Hernandez is a 78 y.o. male who was here to undergo an elective transcatheter aortic valve replacement but this procedure was aborted because the preoperative TEE found an annular hematoma. It was felt this may be related to his recent heart catheterization and PCI placement. He was discharged to home and his Eliquis was held and he was sent home on Plavix.   ???  As part of his workup he had an echo in January of this year that showed:  ??? Technically difficult study  ??? The aortic valve is heavily calcified and although the valve is not ideally visualized structurally, the Doppler parameters (including peak velocity, mean gradient, valve area by continuity equation and dimensionless index) would be consistent with severe stenosis  ??? Hyperdynamic left ventricular function. Hyperdynamic LV systolic function with an ejection fraction in the range of 70%  ??? Grade II (moderate) left ventricular diastolic dysfunction. Elevated left atrial pressure  ??? Mild concentric LV hypertrophy  ??? Estimated Peak Systolic PA Pressure 25 mmHg  ???  ???  He had a heart cath on 05/24/2018:  1. Successful drug-eluting stent percutaneous coronary intervention of mid left circumflex into obtuse marginal 2 with 2.25 x 28 mm Xience Sierra drug-eluting stent.  2. Successful drug-eluting stent percutaneous coronary intervention of mid right posterior descending artery with 2.75 x 15 mm Xience Sierra drug-eluting stent.  ???  PFTs that same day showed an FEV1 of pse82% predicted and a DLCO of 83% predicted  ???  He had a carotid duplex on 05/24/18 that showed:  1. Moderate stenosis [50-79%] noted in right proximal internal carotid artery with moderate atheromatous plaque and turbulent flow. Based on end-diastolic velocity and ratio criteria stenosis is likely less than 70%.   2. No???other???hemodynamically significant (>50%) stenosis measured in???the bilateral common carotid and left internal carotid arteries. Mild atheromatous plaque visualized in???left internal???carotid artery.  3. There is normal antegrade flow in bilateral vertebral arteries  4. No evidence of proximal subclavian stenosis???bilaterally  5. Bilaterally enlarged thyroid gland noted. ???Dedicated thyroid ultrasound is recommended if clinically indicated.  ???  CT scan on 06/29/18 showed:  1. ???Increase in size of a perivalvular pseudoaneurysm posterior to the   aortic root, likely arising from the left coronary cusp. Clinical and   laboratory correlation for signs of infection is recommended as a   perivalvular abscess often also has this appearance.  2. 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   No thoracic aortic dissection. Aortic valvular thickening and severe   calcification in keeping with known aortic stenosis.   3. ???Stable nodular enlargement of the thyroid with substernal extension.   Thyroid ultrasound is again recommended if not recently performed.  4. ???Cirrhosis and mild splenomegaly.  5. 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 These findings have been described in the asymptomatic   elderly population but may  also be seen in early interstitial lung disease   such as usual interstitial pneumonia (UIP). Consider correlation with   pulmonary function tests and patient symptoms that may be associated with   interstitial lung disease.  ???  Overall his symptoms have included worsening fatigue, SOB with stairs, and generalized weakness that has continued to worsen. He denies chest pain, palpitations, syncope, orthopnea, paroxysmal nocturnal dyspnea, lower extremity edema, chills, fever, myalgias, nonproductive cough, productive cough and sweats.  He also denies any history of MI, CVA, TIA, cancer, blood clots, or blood clotting disorders. He denies any allergy to nickel. Denies current rash on torso or groin area. Denies any reason or religious belief that would impact their care such as receiving blood, tissue, or animal products. They currently follow routinely with their dentist and do not have any known caries or concerns for infection.   ???  He is here today to discuss possible surgical interventions.   ???  This was all reviewed today by Dr. Sindy Messing III and was discussed at length with Adolm Joseph.   ???  ???  ???  Review of Systems   Constitution: Positive for malaise/fatigue.   HENT: Negative.    Eyes: Negative.    Cardiovascular: Negative.    Respiratory: Negative.    Endocrine: Negative.    Hematologic/Lymphatic: Negative.    Skin: Negative.    Musculoskeletal: Positive for muscle weakness.        Pt. Feels like he is getting weaker.     Gastrointestinal: Negative.    Genitourinary: Negative.    Neurological: Positive for light-headedness.   Psychiatric/Behavioral: Negative.    Allergic/Immunologic: Negative.    All other systems reviewed and are negative.  ???       Medical History:   Diagnosis Date   ??? Aortic stenosis ???   ??? Atrial fibrillation (HCC) ???   ??? Back pain ???   ??? CAD (coronary artery disease) ???   ??? Disorder of thyroid gland ???   ??? Heart murmur 09/15/2013   ??? Hyperlipidemia ???   ??? Hypertension 09/15/2013   ??? NSTEMI (non-ST elevated myocardial infarction) (HCC) 12/2015   ??? s/p stent placement   ??? OSA on CPAP ???   ??? No CPAP use after 55lb weight loss   ??? 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 04/2018-found incidentally on CTA but confirmed by groin duplex ??? SCC (squamous cell carcinoma), arm ???   ??? Skin cancer ???   ??? Sleep apnea ???   ???        Surgical History:   Procedure Laterality Date   ??? LUMBAR LAMINECTOMY ??? 2004   ??? L4-5   ??? LUMBAR LAMINECTOMY ??? 2011   ??? Hemilaminectomies L 2-3 L3-4   ??? RIGHT LUMBAR 4-5 REDO LAMINECTOMY AND LATERAL RECESS DECOMPRESSION Right 12/04/2016   ??? Performed by Theotis Barrio, MD at [facility]   ??? LAMINECTOMY/ FACETECTOMY/ FORAMINOTOMY WITH DECOMPRESSION - 1 VERTEBRAL SEGMENT - LUMBAR ??? 12/04/2016   ??? Performed by Theotis Barrio, MD at [facility]   ??? CORONARY STENT PLACEMENT ??? 12/2016   ??? KNEE REPLACEMENT Right 08/2017   ??? ANGIOGRAPHY CORONARY ARTERY WITH LEFT HEART CATHETERIZATION N/A 05/24/2018   ??? Performed by Marcell Barlow, MD at [facility] CATH LAB   ??? POSSIBLE PERCUTANEOUS CORONARY STENT PLACEMENT WITH ANGIOPLASTY N/A 05/24/2018   ??? Performed by Marcell [facility]low, MD at [facility] CATH LAB   ??? CLOSURE PSEUDOANEURYSM BY INJECTION- LOWER  EXTREMITY N/A 05/26/2018   ??? Performed by [facility]cell [facility]low, [facility] at [facility] CATH LAB   ??? CORONARY STENT PLACEMENT ??? 05/2018   ??? x2   ??? COLONOSCOPY ??? ???   ??? HEART CATHETERIZATION ??? 01/2016, 05/2018   ??? Stent x 1 LAD placement following NSTEMI, 2 stents placed 04/2018   ??? HX HEART CATHETERIZATION ??? ???   ??? SKIN CANCER EXCISION ??? 2017, 2018   ??? SCC Arm    ???  Social History      ???  Socioeconomic History   ??? Marital status: Married   ??? ??? Spouse name: Not on file   ??? Number of children: Not on file   ??? Years of education: Not on file   ??? Highest education level: Not on file   Occupational History   ??? Not on file   Tobacco Use   ??? Smoking status: Former Smoker   ??? ??? Packs/day: 0.50   ??? ??? Years: 15.00   ??? ??? Pack years: 7.50   ??? ??? Types: Cigarettes   ??? ??? Last attempt to quit: 11/20/1969   ??? ??? Years since quitting: 48.6   ??? Smokeless tobacco: Never Used   Substance and Sexual Activity   ??? Alcohol use: No   ??? Drug use: Never   ??? Sexual activity: Not on file   Other Topics Concern   ??? Not on file   Social History Narrative   ??? Not on file      ??? Family History   Problem Relation Age of Onset   ??? Heart Disease Mother ???   ???  ???  Objective:         ??? atorvastatin (LIPITOR) 40 mg tablet Take one tablet by mouth daily.   ??? clopiDOGrel (PLAVIX) 75 mg tablet Take one tablet by mouth daily.   ??? losartan (COZAAR) 100 mg tablet Take one tablet by mouth daily.   ??? methIMAzole (TAPAZOLE) 10 mg tablet Take  by mouth twice daily. Take 1 tablet in the morning and 1.5 tablets in the evening   ??? metoprolol tartrate (LOPRESSOR) 50 mg tablet Take one-half tablet by mouth twice daily.   ??? nitroglycerin (NITROSTAT) 0.4 mg tablet Place one tablet under tongue every 5 minutes as needed for Chest Pain. Max of 3 tablets, call 911.   ??? tamsulosin (FLOMAX) 0.4 mg capsule Take 1 capsule by mouth at bedtime daily.   ???      Vitals:   ??? 07/06/18 1322   BP: (!) 146/70   BP Source: Arm, Right Upper   Pulse: 71   Temp: 36.7 ???C (98 ???F)   SpO2: 99%   Weight: 93.8 kg (206 lb 11.2 oz)   Height: 1.829 m (6')   PainSc: Zero   ???  Body mass index is 28.03 kg/m???.   ???  Physical Exam  Constitutional:       Appearance: Normal appearance. He is normal weight.   Cardiovascular:      Rate and Rhythm: Normal rate and regular rhythm.      Pulses: Normal pulses.      Heart sounds: Murmur present.   Pulmonary:      Effort: Pulmonary effort is normal. No respiratory distress.      Breath sounds: Normal breath sounds.   Abdominal:      General: Abdomen is flat.      Palpations: Abdomen is soft.   Musculoskeletal: Normal range of motion.  General: No swelling.   Skin:     General: Skin is warm and dry.   Neurological:      Mental Status: He is alert and oriented to person, place, and time.   Psychiatric:         Mood and Affect: Mood normal.         Behavior: Behavior normal.         Thought Content: Thought content normal.         Judgment: Judgment normal.   ???  ???  ???  Assessment:  1. Nonrheumatic aortic valve stenosis    2. Acute on chronic diastolic CHF (congestive heart failure), NYHA class 2 (HCC) 3. Intramural hematoma of thoracic aorta (HCC)    4. Paroxysmal atrial fibrillation (HCC)    5. Coronary artery disease due to lipid rich plaque    6. S/p bare metal coronary artery stent    7. Pseudoaneurysm of right femoral artery (HCC)    8. Thrombocytopenia (HCC)    9. RBBB    10. Essential hypertension    11. Pure hypercholesterolemia    12. Spinal stenosis at L4-L5 level    ???  ???  ???  ???  We will have him undergo an AVR/Root replacement on 07/21/18. He will hold his Plavix for 1 week and we will bridge with Lovenox starting on 6/20.   ???  ???  Deliah Goody, APRN  Thoracic & Cardiovascular Surgery  [phone]    07/06/2018   ???  ???  ???  ???  Assessment and Plan:  I had the pleasure of seeing Mr. Lahey for ongoing evaluation of his severe symptomatic aortic stenosis.  As you may recall, he is a very pleasant 78 year old gentleman who is been previously evaluated and was scheduled for TAVR.  At the time of TAVR TEE demonstrated a pseudoaneurysm of his aortic annulus that appeared to be based off around the left coronary sinus.  He had had previous stenting during his TAVR evaluation that was a technically difficult procedure.  We subsequently performed a follow-up CTA which demonstrated that the pseudoaneurysm had increased slightly in size.  ???  I reviewed these findings with him today in the office.  This is a very uncommon clinical entity.  In my opinion I think this likely could be written out over several months and it would likely resolve.  However given that he is profoundly symptomatic from his aortic stenosis and he needs intervention, I do not think TAVR is wise.  I am concerned that this may lead to annular disruption of an emergent conversion to an open procedure.  I discussed elective replacement his aortic valve and likely aortic root replacement.  ???  We specifically discussed the risks of bleeding, infection, stroke, dialysis and death.  We also discussed a 4% incidence of needing a permanent pacemaker afterwards.  He has expressed understanding and is willing to proceed.  We will hold his antiplatelet regimen will be before the procedure and bridge with Lovenox.  I appreciate the opportunity participate in the care of this nice gentleman.  We will keep you updated on his progress.  ???  Sindy Messing, M.D.  ???  ???  ???

## 2018-07-21 ENCOUNTER — Encounter: Admit: 2018-07-26 | Discharge: 2018-07-26

## 2018-07-21 ENCOUNTER — Encounter: Admit: 2018-07-27 | Discharge: 2018-07-27

## 2018-07-21 ENCOUNTER — Encounter: Admit: 2018-07-21 | Discharge: 2018-07-21

## 2018-07-21 DIAGNOSIS — G4730 Sleep apnea, unspecified: Secondary | ICD-10-CM

## 2018-07-21 DIAGNOSIS — I350 Nonrheumatic aortic (valve) stenosis: Principal | ICD-10-CM

## 2018-07-21 DIAGNOSIS — M549 Dorsalgia, unspecified: Secondary | ICD-10-CM

## 2018-07-21 DIAGNOSIS — G4733 Obstructive sleep apnea (adult) (pediatric): Secondary | ICD-10-CM

## 2018-07-21 DIAGNOSIS — E079 Disorder of thyroid, unspecified: Secondary | ICD-10-CM

## 2018-07-21 DIAGNOSIS — I724 Aneurysm of artery of lower extremity: Secondary | ICD-10-CM

## 2018-07-21 DIAGNOSIS — I4891 Unspecified atrial fibrillation: Secondary | ICD-10-CM

## 2018-07-21 DIAGNOSIS — I214 Non-ST elevation (NSTEMI) myocardial infarction: Secondary | ICD-10-CM

## 2018-07-21 DIAGNOSIS — R011 Cardiac murmur, unspecified: Secondary | ICD-10-CM

## 2018-07-21 DIAGNOSIS — I10 Essential (primary) hypertension: Secondary | ICD-10-CM

## 2018-07-21 DIAGNOSIS — I2510 Atherosclerotic heart disease of native coronary artery without angina pectoris: Secondary | ICD-10-CM

## 2018-07-21 DIAGNOSIS — E785 Hyperlipidemia, unspecified: Secondary | ICD-10-CM

## 2018-07-21 DIAGNOSIS — C44621 Squamous cell carcinoma of skin of unspecified upper limb, including shoulder: Secondary | ICD-10-CM

## 2018-07-21 DIAGNOSIS — C4490 Unspecified malignant neoplasm of skin, unspecified: Secondary | ICD-10-CM

## 2018-07-21 LAB — PROTIME INR (PT): Lab: 1.2 MMOL/L — ABNORMAL LOW (ref 0.8–1.2)

## 2018-07-21 LAB — POC POTASSIUM
Lab: 4 MMOL/L (ref 3.5–5.1)
Lab: 4.7 MMOL/L — ABNORMAL HIGH (ref 3.5–5.1)
Lab: 4.5 MMOL/L — ABNORMAL HIGH (ref 3.5–5.1)
Lab: 4.8 MMOL/L — ABNORMAL HIGH (ref 3.5–5.1)
Lab: 3.2 MMOL/L — ABNORMAL LOW (ref 3.5–5.1)
Lab: 3.8 MMOL/L — ABNORMAL HIGH (ref 3.5–5.1)
Lab: 3.8 MMOL/L — ABNORMAL HIGH (ref 3.5–5.1)
Lab: 3.8 MMOL/L — ABNORMAL HIGH (ref 3.5–5.1)

## 2018-07-21 LAB — POC IONIZED CALCIUM
Lab: 1.2 MMOL/L (ref 1.0–1.3)
Lab: 1 MMOL/L (ref 1.0–1.3)
Lab: 1.1 MMOL/L (ref 1.0–1.3)
Lab: 1.1 MMOL/L (ref 1.0–1.3)
Lab: 1.1 MMOL/L (ref 1.0–1.3)
Lab: 1 MMOL/L (ref 1.0–1.3)
Lab: 1.1 MMOL/L (ref 1.0–1.3)
Lab: 1.1 MMOL/L (ref 1.0–1.3)

## 2018-07-21 LAB — POC BLOOD GAS ARTERIAL
Lab: 0 MMOL/L
Lab: 100 % — ABNORMAL HIGH (ref 95–99)
Lab: 172 mmHg — ABNORMAL HIGH (ref 80–100)
Lab: 24 MMOL/L (ref 21–28)
Lab: 41 mmHg (ref 35–45)
Lab: 37 mmHg (ref 35–45)
Lab: 561 mmHg — ABNORMAL HIGH (ref 80–100)
Lab: 28 MMOL/L — ABNORMAL HIGH (ref 21–28)
Lab: 45 mmHg (ref 35–45)
Lab: 7.3 (ref 7.35–7.45)
Lab: 7.5 — ABNORMAL HIGH (ref 7.35–7.45)
Lab: 100 % — ABNORMAL HIGH (ref 95–99)
Lab: 52 mmHg — ABNORMAL HIGH (ref 35–45)
Lab: 27 MMOL/L (ref 21–28)
Lab: 56 mmHg — ABNORMAL HIGH (ref 35–45)
Lab: 7.3 (ref 7.35–7.45)
Lab: 7.3 — ABNORMAL LOW (ref 7.35–7.45)
Lab: 7.4 (ref 7.35–7.45)
Lab: 25 MMOL/L (ref 21–28)
Lab: 38 mmHg (ref 35–45)
Lab: 7.4 (ref 7.35–7.45)
Lab: 25 MMOL/L (ref 21–28)
Lab: 51 mmHg — ABNORMAL HIGH (ref 35–45)
Lab: 7.3 — ABNORMAL LOW (ref 7.35–7.45)
Lab: 22 MMOL/L (ref 21–28)
Lab: 40 mmHg (ref 35–45)
Lab: 7.3 (ref 7.35–7.45)
Lab: 122 mmHg — ABNORMAL HIGH (ref 80–100)
Lab: 22 MMOL/L (ref 21–28)
Lab: 4 MMOL/L
Lab: 47 mmHg — ABNORMAL HIGH (ref 35–45)
Lab: 7.2 — ABNORMAL LOW (ref 7.35–7.45)
Lab: 98 % (ref 95–99)

## 2018-07-21 LAB — BASIC METABOLIC PANEL
Lab: 1 mg/dL — ABNORMAL LOW (ref 0.4–1.24)
Lab: 13 pg — ABNORMAL HIGH (ref 3–12)
Lab: 142 MMOL/L — ABNORMAL LOW (ref 137–147)
Lab: 17 mg/dL (ref 7–25)
Lab: 8 mg/dL — ABNORMAL LOW (ref 8.5–10.6)
Lab: 84 mg/dL (ref 70–100)
Lab: >60 mL/min (ref >60)
Lab: >60 mL/min (ref >60)

## 2018-07-21 LAB — POC GLUCOSE
Lab: 90 mg/dL (ref 70–100)
Lab: 101 mg/dL — ABNORMAL HIGH (ref 70–100)
Lab: 109 mg/dL — ABNORMAL HIGH (ref 70–100)
Lab: 125 mg/dL — ABNORMAL HIGH (ref 70–100)
Lab: 128 mg/dL — ABNORMAL HIGH (ref 70–100)
Lab: 143 mg/dL — ABNORMAL HIGH (ref >=60)
Lab: 149 mg/dL — ABNORMAL HIGH (ref 70–100)
Lab: 159 mg/dL — ABNORMAL HIGH (ref 70–100)
Lab: 138 mg/dL — ABNORMAL HIGH (ref 70–100)
Lab: 157 mg/dL — ABNORMAL HIGH (ref 70–100)
Lab: 101 mg/dL — ABNORMAL HIGH (ref 70–100)
Lab: 88 mg/dL (ref 70–100)
Lab: 115 mg/dL — ABNORMAL HIGH (ref 70–100)

## 2018-07-21 LAB — POC HEMATOCRIT
Lab: 12 g/dL — ABNORMAL LOW (ref 13.5–16.5)
Lab: 38 % — ABNORMAL LOW (ref 40–50)
Lab: 29 % — ABNORMAL LOW (ref 40–50)
Lab: 9.9 g/dL — ABNORMAL LOW (ref 13.5–16.5)
Lab: 12 g/dL — ABNORMAL LOW (ref 13.5–16.5)
Lab: 36 % — ABNORMAL LOW (ref 40–50)
Lab: 11 g/dL — ABNORMAL LOW (ref 13.5–16.5)
Lab: 35 % — ABNORMAL LOW (ref 40–50)
Lab: 10 g/dL — ABNORMAL LOW (ref 13.5–16.5)
Lab: 32 % — ABNORMAL LOW (ref 40–50)
Lab: 10 g/dL — ABNORMAL LOW (ref 13.5–16.5)
Lab: 32 % — ABNORMAL LOW (ref 40–50)
Lab: 11 g/dL — ABNORMAL LOW (ref 13.5–16.5)
Lab: 33 % — ABNORMAL LOW (ref 40–50)
Lab: 11 g/dL — ABNORMAL LOW (ref 13.5–16.5)
Lab: 34 % — ABNORMAL LOW (ref 40–50)

## 2018-07-21 LAB — ACTIVATED CLOTTING TIME HMS
Lab: 534 s
Lab: 584 s
Lab: 466 s
Lab: 480 s
Lab: 443 s
Lab: 456 s
Lab: 461 s
Lab: 443 s
Lab: 549 s
Lab: 136 s (ref 26–34)

## 2018-07-21 LAB — POC SODIUM
Lab: 139 MMOL/L (ref 137–147)
Lab: 136 MMOL/L — ABNORMAL LOW (ref 137–147)
Lab: 138 MMOL/L (ref 137–147)
Lab: 137 MMOL/L (ref 137–147)
Lab: 140 MMOL/L (ref 137–147)
Lab: 138 MMOL/L (ref 137–147)
Lab: 140 MMOL/L (ref 137–147)
Lab: 142 MMOL/L (ref 137–147)

## 2018-07-21 LAB — CBC: Lab: 17 10*3/uL — ABNORMAL HIGH (ref 4.5–11.0)

## 2018-07-21 LAB — PTT (APTT): Lab: 31 s — ABNORMAL LOW (ref 24.0–36.5)

## 2018-07-21 LAB — BASELINE ACTIVATED CLOTTING TIME HMS: Lab: 163 s

## 2018-07-21 LAB — MAGNESIUM: Lab: 3.2 mg/dL — ABNORMAL HIGH (ref 1.6–2.6)

## 2018-07-21 MED ORDER — POTASSIUM CHLORIDE 20 MEQ PO TBTQ
20-40 meq | ORAL | 0 refills | Status: DC | PRN
Start: 2018-07-21 — End: 2018-08-05
  Administered 2018-07-26 – 2018-07-28 (×2): 20 meq via ORAL
  Administered 2018-07-29 – 2018-08-05 (×9): 40 meq via ORAL

## 2018-07-21 MED ORDER — INSULIN REGULAR IN 0.9 % NACL 100 UNIT/100 ML (1 UNIT/ML) IV SOLN
1-32 [IU]/h | INTRAVENOUS | 0 refills | Status: DC
Start: 2018-07-21 — End: 2018-07-22

## 2018-07-21 MED ORDER — ONDANSETRON HCL 4 MG PO TAB
4 mg | ORAL | 0 refills | Status: DC | PRN
Start: 2018-07-21 — End: 2018-08-05
  Administered 2018-07-24: 04:00:00 4 mg via ORAL

## 2018-07-21 MED ORDER — ATROPINE 0.1 MG/ML IJ SYRG
.5 mg | Freq: Once | INTRAVENOUS | 0 refills | Status: AC | PRN
Start: 2018-07-21 — End: ?

## 2018-07-21 MED ORDER — FAMOTIDINE (PF) 20 MG/2 ML IV SOLN
20 mg | Freq: Two times a day (BID) | INTRAVENOUS | 0 refills | Status: DC
Start: 2018-07-21 — End: 2018-07-22

## 2018-07-21 MED ORDER — ELECTROLYTE-A IV SOLP
0 refills | Status: DC
Start: 2018-07-21 — End: 2018-07-21
  Administered 2018-07-21: 13:00:00 via INTRAVENOUS

## 2018-07-21 MED ORDER — AMIODARONE 200 MG PO TAB
400 mg | Freq: Two times a day (BID) | ORAL | 0 refills | Status: DC
Start: 2018-07-21 — End: 2018-07-22

## 2018-07-21 MED ORDER — LIDOCAINE (PF) 10 MG/ML (1 %) IJ SOLN
SUBCUTANEOUS | 0 refills | Status: CP
Start: 2018-07-21 — End: ?
  Administered 2018-07-21: 12:00:00 .5 mL via SUBCUTANEOUS

## 2018-07-21 MED ORDER — ATORVASTATIN 40 MG PO TAB
40 mg | Freq: Every evening | ORAL | 0 refills | Status: DC
Start: 2018-07-21 — End: 2018-08-05
  Administered 2018-07-22 – 2018-08-05 (×14): 40 mg via ORAL

## 2018-07-21 MED ORDER — ACETAMINOPHEN 650 MG RE SUPP
650 mg | RECTAL | 0 refills | Status: DC | PRN
Start: 2018-07-21 — End: 2018-08-05

## 2018-07-21 MED ORDER — PHENYLEPHRINE IN 0.9% NACL(PF) 1 MG/10 ML (100 MCG/ML) IV SYRG
0 refills | Status: DC
Start: 2018-07-21 — End: 2018-07-21

## 2018-07-21 MED ORDER — VASOPRESSIN 20 UNIT/ML IV SOLN
0 refills | Status: DC
Start: 2018-07-21 — End: 2018-07-21
  Administered 2018-07-21 (×2): .5 [IU] via INTRAVENOUS

## 2018-07-21 MED ORDER — MAGNESIUM HYDROXIDE 2,400 MG/10 ML PO SUSP
10 mL | Freq: Every day | ORAL | 0 refills | Status: DC | PRN
Start: 2018-07-21 — End: 2018-08-05
  Administered 2018-07-26 – 2018-07-27 (×2): 10 mL via ORAL

## 2018-07-21 MED ORDER — METHIMAZOLE 10 MG PO TAB
10 mg | Freq: Every day | ORAL | 0 refills | Status: DC
Start: 2018-07-21 — End: 2018-08-05
  Administered 2018-07-22 – 2018-08-05 (×15): 10 mg via ORAL

## 2018-07-21 MED ORDER — ALBUMIN, HUMAN 5 % IV SOLP
250 mL | Freq: Once | INTRAVENOUS | 0 refills | Status: CP
Start: 2018-07-21 — End: ?
  Administered 2018-07-21: 21:00:00 250 mL via INTRAVENOUS

## 2018-07-21 MED ORDER — SODIUM CHLORIDE 0.9 % IV SOLP
0 refills | Status: CP
Start: 2018-07-21 — End: ?
  Administered 2018-07-21: 14:00:00 5000 mL
  Administered 2018-07-21: 16:00:00 1000 mL

## 2018-07-21 MED ORDER — MAGNESIUM SULFATE IN WATER 4 GRAM/50 ML (8 %) IV PGBK
4 g | Freq: Once | INTRAVENOUS | 0 refills | Status: CP
Start: 2018-07-21 — End: ?
  Administered 2018-07-21: 21:00:00 4 g via INTRAVENOUS

## 2018-07-21 MED ORDER — AMINOCAPROIC ACID 250 MG/ML IV SOLN
INTRAVENOUS | 0 refills | Status: DC
Start: 2018-07-21 — End: 2018-07-21
  Administered 2018-07-21: 14:00:00 5 g via INTRAVENOUS

## 2018-07-21 MED ORDER — VANCOMYCIN 1,000 MG IV SOLR
0 refills | Status: DC
Start: 2018-07-21 — End: 2018-07-21
  Administered 2018-07-21: 14:00:00 4 g via TOPICAL

## 2018-07-21 MED ORDER — PHENYLEPHRINE HCL 10 MG/ML IJ SOLN
0 refills | Status: DC
Start: 2018-07-21 — End: 2018-07-21
  Administered 2018-07-21 (×3): 200 ug via INTRAVENOUS
  Administered 2018-07-21: 13:00:00 100 ug via INTRAVENOUS
  Administered 2018-07-21: 14:00:00 500 ug via INTRAVENOUS
  Administered 2018-07-21 (×2): 100 ug via INTRAVENOUS
  Administered 2018-07-21: 13:00:00 200 ug via INTRAVENOUS
  Administered 2018-07-21: 13:00:00 100 ug via INTRAVENOUS

## 2018-07-21 MED ORDER — POTASSIUM CHLORIDE IN WATER 10 MEQ/50 ML IV PGBK
10 meq | INTRAVENOUS | 0 refills | Status: DC | PRN
Start: 2018-07-21 — End: 2018-08-05
  Administered 2018-07-21 – 2018-07-23 (×6): 10 meq via INTRAVENOUS

## 2018-07-21 MED ORDER — OXYCODONE 5 MG PO TAB
5-10 mg | ORAL | 0 refills | Status: DC | PRN
Start: 2018-07-21 — End: 2018-07-27
  Administered 2018-07-22: 06:00:00 10 mg via ORAL
  Administered 2018-07-22 (×2): 5 mg via ORAL
  Administered 2018-07-22: 10:00:00 10 mg via ORAL
  Administered 2018-07-22: 19:00:00 5 mg via ORAL
  Administered 2018-07-23 (×4): 10 mg via ORAL
  Administered 2018-07-24 (×2): 5 mg via ORAL
  Administered 2018-07-25 – 2018-07-26 (×6): 10 mg via ORAL

## 2018-07-21 MED ORDER — LIDOCAINE (PF) 10 MG/ML (1 %) IJ SOLN
.1-2 mL | INTRAMUSCULAR | 0 refills | Status: DC | PRN
Start: 2018-07-21 — End: 2018-07-21

## 2018-07-21 MED ORDER — ALBUMIN, HUMAN 5 % IV SOLP
250 mL | Freq: Once | INTRAVENOUS | 0 refills | Status: CP
Start: 2018-07-21 — End: ?
  Administered 2018-07-21: 23:00:00 250 mL via INTRAVENOUS

## 2018-07-21 MED ORDER — LIDOCAINE (PF) 200 MG/10 ML (2 %) IJ SYRG
0 refills | Status: DC
Start: 2018-07-21 — End: 2018-07-21
  Administered 2018-07-21: 12:00:00 100 mg via INTRAVENOUS

## 2018-07-21 MED ORDER — SUCCINYLCHOLINE CHLORIDE 20 MG/ML IJ SOLN
INTRAVENOUS | 0 refills | Status: DC
Start: 2018-07-21 — End: 2018-07-21
  Administered 2018-07-21: 13:00:00 100 mg via INTRAVENOUS

## 2018-07-21 MED ORDER — PHENYLEPHRINE 40 MCG/ML IN NS IV DRIP (STD CONC)
0-3 ug/kg/min | INTRAVENOUS | 0 refills | Status: DC
Start: 2018-07-21 — End: 2018-07-22
  Administered 2018-07-21 (×2): 0.1 ug/kg/min via INTRAVENOUS
  Administered 2018-07-22 (×2): 0.4 ug/kg/min via INTRAVENOUS
  Administered 2018-07-22 (×2): 0.6 ug/kg/min via INTRAVENOUS

## 2018-07-21 MED ORDER — FENTANYL CITRATE (PF) 50 MCG/ML IJ SOLN
25-50 ug | INTRAVENOUS | 0 refills | Status: DC | PRN
Start: 2018-07-21 — End: 2018-07-22

## 2018-07-21 MED ORDER — NOREPINEPHRINE BITARTRATE-NACL 4 MG/250 ML (16 MCG/ML) IV SOLN
0-.5 ug/kg/min | INTRAVENOUS | 0 refills | Status: DC
Start: 2018-07-21 — End: 2018-07-22

## 2018-07-21 MED ORDER — ETOMIDATE 2 MG/ML IV SOLN
0 refills | Status: DC
Start: 2018-07-21 — End: 2018-07-21
  Administered 2018-07-21: 13:00:00 4 mg via INTRAVENOUS
  Administered 2018-07-21: 13:00:00 16 mg via INTRAVENOUS

## 2018-07-21 MED ORDER — SODIUM CHLORIDE 0.9 % IV SOLP
INTRAVENOUS | 0 refills | Status: AC
Start: 2018-07-21 — End: ?

## 2018-07-21 MED ORDER — CEFAZOLIN INJ 1GM IVP
2 g | INTRAVENOUS | 0 refills | Status: CP
Start: 2018-07-21 — End: ?
  Administered 2018-07-22 (×3): 2 g via INTRAVENOUS

## 2018-07-21 MED ORDER — ALUM-MAG HYDROXIDE-SIMETH 200-200-20 MG/5 ML PO SUSP
30 mL | ORAL | 0 refills | Status: DC | PRN
Start: 2018-07-21 — End: 2018-08-05

## 2018-07-21 MED ORDER — HYDROMORPHONE (PF) 2 MG/ML IJ SYRG
0 refills | Status: DC
Start: 2018-07-21 — End: 2018-07-21
  Administered 2018-07-21 (×2): 1 mg via INTRAVENOUS
  Administered 2018-07-21: 18:00:00 2 mg via INTRAVENOUS
  Administered 2018-07-21 (×4): 1 mg via INTRAVENOUS

## 2018-07-21 MED ORDER — AMINOCAPROIC ACID 12.5 GM INFUSION (OR)
INTRAVENOUS | 0 refills | Status: DC
Start: 2018-07-21 — End: 2018-07-21
  Administered 2018-07-21 (×2): 40 mL/h via INTRAVENOUS

## 2018-07-21 MED ORDER — DEXMEDETOMIDINE IN 0.9 % NACL 400 MCG/100 ML (4 MCG/ML) IV SOLN
0.2-1 ug/kg/h | INTRAVENOUS | 0 refills | Status: DC
Start: 2018-07-21 — End: 2018-07-21

## 2018-07-21 MED ORDER — VECURONIUM BROMIDE 10 MG IV SOLR
INTRAVENOUS | 0 refills | Status: DC
Start: 2018-07-21 — End: 2018-07-21
  Administered 2018-07-21: 17:00:00 5 mg via INTRAVENOUS
  Administered 2018-07-21: 13:00:00 10 mg via INTRAVENOUS
  Administered 2018-07-21: 14:00:00 5 mg via INTRAVENOUS

## 2018-07-21 MED ORDER — HEPARIN (PORCINE) 1,000 UNIT/ML IJ SOLN
0 refills | Status: DC
Start: 2018-07-21 — End: 2018-07-21

## 2018-07-21 MED ORDER — NOREPINEPHRINE IV DRIP STD CONC (AM)(OR)
0 refills | Status: DC
Start: 2018-07-21 — End: 2018-07-21
  Administered 2018-07-21: 13:00:00 0.05 ug/kg/min via INTRAVENOUS

## 2018-07-21 MED ORDER — SODIUM CHLORIDE 0.9 % IV SOLP
INTRAVENOUS | 0 refills | Status: DC
Start: 2018-07-21 — End: 2018-07-21

## 2018-07-21 MED ORDER — NITROGLYCERIN 200MCG SYRINGE (OR)(OSM)
0 refills | Status: DC
Start: 2018-07-21 — End: 2018-07-21
  Administered 2018-07-21 (×2): 20 ug via INTRAVENOUS

## 2018-07-21 MED ORDER — ASPIRIN 81 MG PO CHEW
81 mg | Freq: Every day | ORAL | 0 refills | Status: DC
Start: 2018-07-21 — End: 2018-08-05
  Administered 2018-07-22 – 2018-08-05 (×15): 81 mg via ORAL

## 2018-07-21 MED ORDER — BISACODYL 10 MG RE SUPP
10 mg | Freq: Every day | RECTAL | 0 refills | Status: DC | PRN
Start: 2018-07-21 — End: 2018-08-05
  Administered 2018-07-27: 15:00:00 10 mg via RECTAL

## 2018-07-21 MED ORDER — DEXMEDETOMIDINE 4 MCG/ML (INFUSION)(AM)(OR)
0 refills | Status: DC
Start: 2018-07-21 — End: 2018-07-21
  Administered 2018-07-21: 19:00:00 .7 ug/kg/h via INTRAVENOUS

## 2018-07-21 MED ORDER — LIDOCAINE (PF) 100 MG/5 ML (2 %) IV SYRG
1 mg/kg | INTRAVENOUS | 0 refills | Status: DC | PRN
Start: 2018-07-21 — End: 2018-07-22

## 2018-07-21 MED ORDER — FAMOTIDINE 20 MG PO TAB
20 mg | Freq: Two times a day (BID) | ORAL | 0 refills | Status: DC
Start: 2018-07-21 — End: 2018-07-22
  Administered 2018-07-22: 01:00:00 20 mg via ORAL

## 2018-07-21 MED ORDER — ONDANSETRON HCL (PF) 4 MG/2 ML IJ SOLN
4 mg | INTRAVENOUS | 0 refills | Status: DC | PRN
Start: 2018-07-21 — End: 2018-08-05
  Administered 2018-07-25: 02:00:00 4 mg via INTRAVENOUS

## 2018-07-21 MED ORDER — TAMSULOSIN 0.4 MG PO CAP
.4 mg | Freq: Every evening | ORAL | 0 refills | Status: DC
Start: 2018-07-21 — End: 2018-08-05
  Administered 2018-07-22 – 2018-08-05 (×14): 0.4 mg via ORAL

## 2018-07-21 MED ORDER — HYDRALAZINE 20 MG/ML IJ SOLN
10 mg | INTRAVENOUS | 0 refills | Status: DC | PRN
Start: 2018-07-21 — End: 2018-08-05
  Administered 2018-08-02: 18:00:00 10 mg via INTRAVENOUS

## 2018-07-21 MED ORDER — ACETAMINOPHEN 325 MG PO TAB
650 mg | ORAL | 0 refills | Status: DC | PRN
Start: 2018-07-21 — End: 2018-08-05
  Administered 2018-07-26 – 2018-08-05 (×22): 650 mg via ORAL

## 2018-07-21 MED ORDER — METHIMAZOLE 5 MG PO TAB
2.5 mg | Freq: Every day | ORAL | 0 refills | Status: DC
Start: 2018-07-21 — End: 2018-07-21

## 2018-07-21 MED ORDER — NICARDIPINE IN NACL (ISO-OS) 20 MG/200 ML IV PGBK
5-15 mg/h | INTRAVENOUS | 0 refills | Status: DC
Start: 2018-07-21 — End: 2018-07-22

## 2018-07-21 MED ORDER — SENNOSIDES-DOCUSATE SODIUM 8.6-50 MG PO TAB
2 | Freq: Two times a day (BID) | ORAL | 0 refills | Status: DC
Start: 2018-07-21 — End: 2018-08-05
  Administered 2018-07-22 – 2018-07-27 (×10): 2 via ORAL

## 2018-07-21 MED ORDER — NITROGLYCERIN IN 5 % DEXTROSE 50 MG/250 ML (200 MCG/ML) IV SOLN
.1-3 ug/kg/min | INTRAVENOUS | 0 refills | Status: DC
Start: 2018-07-21 — End: 2018-07-22

## 2018-07-21 MED ORDER — IMS MIXTURE TEMPLATE
15 mg | Freq: Every day | ORAL | 0 refills | Status: DC
Start: 2018-07-21 — End: 2018-08-05
  Administered 2018-07-22 – 2018-08-05 (×27): 15 mg via ORAL

## 2018-07-21 MED ORDER — MIDAZOLAM 1 MG/ML IJ SOLN
0 refills | Status: DC
Start: 2018-07-21 — End: 2018-07-21
  Administered 2018-07-21: 17:00:00 2 mg via INTRAVENOUS

## 2018-07-21 MED ORDER — ACETAMINOPHEN 500 MG PO TAB
1000 mg | ORAL | 0 refills | Status: AC
Start: 2018-07-21 — End: ?
  Administered 2018-07-22 – 2018-07-25 (×10): 1000 mg via ORAL

## 2018-07-21 MED ORDER — POTASSIUM CHLORIDE 20 MEQ/15 ML PO LIQD
20-40 meq | NASOGASTRIC | 0 refills | Status: DC | PRN
Start: 2018-07-21 — End: 2018-08-05

## 2018-07-21 MED ORDER — PROTAMINE 10 MG/ML IV SOLN
0 refills | Status: DC
Start: 2018-07-21 — End: 2018-07-21
  Administered 2018-07-21: 19:00:00 40 mg via INTRAVENOUS
  Administered 2018-07-21: 19:00:00 30 mg via INTRAVENOUS
  Administered 2018-07-21: 19:00:00 25 mg via INTRAVENOUS
  Administered 2018-07-21: 19:00:00 30 mg via INTRAVENOUS
  Administered 2018-07-21: 19:00:00 50 mg via INTRAVENOUS

## 2018-07-21 MED ORDER — AMINOCAPROIC ACID INFUSION
2 g/h | INTRAVENOUS | 0 refills | Status: AC
Start: 2018-07-21 — End: ?

## 2018-07-21 MED ORDER — HEPARIN (PORCINE) 1,000 UNIT/ML IJ SOLN
INTRAVENOUS | 0 refills | Status: DC
Start: 2018-07-21 — End: 2018-07-21
  Administered 2018-07-21: 14:00:00 25000 [IU] via INTRAVENOUS

## 2018-07-21 MED ORDER — CEFAZOLIN 1 GRAM IJ SOLR
0 refills | Status: DC
Start: 2018-07-21 — End: 2018-07-21
  Administered 2018-07-21 (×3): 2 g via INTRAVENOUS

## 2018-07-21 MED ORDER — AUTOLOGOUS BLOOD (CELL SAVER)
0 refills | Status: DC
Start: 2018-07-21 — End: 2018-07-21

## 2018-07-21 MED ADMIN — SODIUM CHLORIDE 0.9 % IV SOLP [27838]: INTRAVENOUS | @ 12:00:00 | Stop: 2018-07-21 | NDC 00338004904

## 2018-07-21 NOTE — Progress Notes
Patient arrived to room # (HC302) via bed accompanied by CV[facility]. Bedside safety checks completed. Initial patient assessment completed. Refer to flowsheet for details.    Admission skin assessment completed with: Adela Lank, RN    Pressure injury present on arrival?: No    1. Head/Face/Neck: No  2. Trunk/Back: No  3. Upper Extremities: No  4. Lower Extremities: No  5. Pelvic/Coccyx: No  6. Assessed for device associated injury? Yes  7. Malnutrition Screening Tool (Nursing Nutrition Assessment) Completed? Yes    See Doc Flowsheet for additional wound details.     INTERVENTIONS:     q2 turn; offloading pressure points

## 2018-07-21 NOTE — Anesthesia Procedure Notes
Anesthesia Procedure: Pulmonary Artery Catheter    PULMONARY ARTERY CATHETER INSERTION  Date/Time: 07/21/2018 8:02 AM  This note is used in conjunction with the CVC Line Insertion note for additional details regarding the insertion of a Pulmonary Artery Catheter:     PA Catheter Insertion Procedure   Catheter type: standard  Insertion depth: 48 cm  Events: none    Performed by: Tresea Mall, MD  Authorized by: Silver Huguenin, MD

## 2018-07-21 NOTE — Anesthesia Procedure Notes
Anesthesia Procedure: Central Venous Catheter Line    CENTRAL LINE INSERTION  Date/Time: 07/21/2018 7:57 AM    Patient location: OR  Indications: hemodynamic pressure monitoring and vascular access    Preprocedure checklist performed: 2 patient identifiers, risks & benefits discussed, patient evaluated, timeout performed, consent obtained, patient being monitored and CVC bundle followed (proper hand washing, maximal sterile barrier technique with cap, sterile gown, sterile glove, sterile drape, and skin prep for antisepsis)        CVC Line Insertion Procedure  Skin prepped with chlorhexidine; skin prep agent completely dried prior to procedure.  Patient Position: Trendelenburg  Location: internal jugular vein  Laterality: right  Vein identification: ultrasound guided  Confirmation of venous placement prior to dilation of vein by: ultrasound  Ultrasound image captured  Number of attempts: 1 and 2  Successful placement: yes  Patient sedated: yes      Catheter:     Catheter type: introducer placed using standard wire through needle technique     Catheter size: 9 Fr      Procedure Outcome  Post procedure: all ports aspirated, dressing applied, line sutured and securement device; Dressing: chlorhexidine impregnated sponge and sterile occlusive dressing  Placement verification: x-ray verification pending  Events: none  Observations: patient tolerated well        Performed by: Tresea Mall, MD  Authorized by: Silver Huguenin, MD

## 2018-07-21 NOTE — Progress Notes
CARDIOPULMONARY REHABILITATION  INPATIENT ASSESSMENT    Cardiac Rehabilitation Staff: Algis Liming Discharge Date:     Demographics  Pre-admit Dx: Aortic Stenosis Date of Admission: 07/21/2018     Room: HC302/01 DOB:  12-23-1940   Insurance: Primary: Medicare  Secondary: Unknown   Address: [address]  [place] [address]   Patient Phone:  [phone] (home)    Marital Status: Married  Occupation: Unknown   ED Contact: Sydnee Cabal   ED Phone #: [phone]   CTS: Helen Hashimoto  Cardiologist: Mackey Birchwood     Cardiac Procedures and Events     Valve: 07/21/18(AVR)                       Risk Factors  Risk Factors: Hypertension, Hyperlipidemia  BP: (!) 179/82  Height: 185.4 cm (73)  Weight: 90.7 kg (199 lb 15.3 oz)  BMI (Calculated): 26.38      Medical History   has a past medical history of Aortic stenosis, Atrial fibrillation (HCC), Back pain, CAD (coronary artery disease), Disorder of thyroid gland, Heart murmur (09/15/2013), Hyperlipidemia, Hypertension (09/15/2013), NSTEMI (non-ST elevated myocardial infarction) (HCC) (12/2015), OSA on CPAP, Pseudoaneurysm of right femoral artery (HCC) (05/25/2018), SCC (squamous cell carcinoma), arm, Skin cancer, and Sleep apnea.    Labs  Cholesterol   Date Value Ref Range Status   02/16/2018 116 (L) 150 - 200 Final     Triglycerides   Date Value Ref Range Status   02/16/2018 67  Final     HDL   Date Value Ref Range Status   02/16/2018 39  Final     LDL   Date Value Ref Range Status   02/16/2018 63  Final     Hemoglobin A1C   Date Value Ref Range Status   07/06/2018 5.2 4.0 - 6.0 % Final     Comment:     The ADA recommends that most patients with type 1 and type 2 diabetes maintain   an A1c level <7%.       Troponin-I   Date Value Ref Range Status   01/19/2016 1.31 (H) 0.0 - 0.05 NG/ML Final         Heart Resource Manual Given:       Teaching Completed:       Outpatient Cardiopulmonary Rehabilitation    Outpatient Cardic Rehab:      Referral Faxed to:       Date Faxed:      Location: If , Sent to Staff:            Algis Liming  07/21/2018

## 2018-07-21 NOTE — Other
Brief Operative Note    Name: Joel Hernandez is a 78 y.o. male     DOB: December 15, 1940             MRN#: [id number]  DATE OF OPERATION: 07/21/2018    Date:  07/21/2018        Preoperative Dx:   Aortic valve stenosis, etiology of cardiac valve disease unspecified [I35.0]    Post-op Diagnosis      * Aortic valve stenosis, etiology of cardiac valve disease unspecified [I35.0]    Procedure(s):  Aortic Root replacement (Freestyle)/Implantation of coronary artery buttons, Circ arrest    Anesthesia Type: Defer to Anesthesia    Surgeon(s) and Role:     * Azucena Cecil (Trip) Stan Head, MD - Primary     * Randell Patient, PA-C - Assisting     * Norm Salt, PA-C - Assisting    Abx: 2g Ancef [phone]; 1115    Findings:  See full dictation    Estimated Blood Loss: No blood loss documented.     Specimen(s) Removed/Disposition:   ID Type Source Tests Collected by Time Destination   1 : Aortic valve leaflet for permanent Tissue Aortic Valve SURGICAL PATHOLOGY          Azucena Cecil (Trip) L III, MD 07/21/2018 [phone]    2 : Aorta for permanent  Tissue Aorta SURGICAL PATHOLOGY          Azucena Cecil (Trip) L III, MD 07/21/2018 [id number]        Complications:  None    Implants: 72mm Gelweave Aortic graft    Drains: Chest Tube(s): 120 mL    Disposition:  ICU - stable    Norm Salt, PA-C  Pager [phone]

## 2018-07-21 NOTE — Progress Notes
Pt arrived to [facility] from [facility]; pt sedated, PERR: afebrile; T36.2;  HR 60s SR AV epicardial wires set to AAI back up rate 40 out 10 sens 0.5; SBP 110s; PA 20s-30s/10s; CVP ~6-8; CI ~2.21  ETT VC-AC TV 640 R16 PEEP 5 40% FiO2; lungs clear/dim;  OG-LIWS- Foley- AUOP; hypoactive bowel sounds; midsternal incision C/D/I; CT x2 -20 wall suction ~153mL preset on arrival 38mL out after turn; HOB at 30 degrees;    Infusing:  Levophed  NS TKO  Mag Sulfate  Potassium Chloride replacement

## 2018-07-21 NOTE — Anesthesia Post-Procedure Evaluation
Post-Anesthesia Evaluation    Name: Joel Hernandez      MRN: [id number]     DOB: 08-Dec-1940     Age: 78 y.o.     Sex: male   __________________________________________________________________________     Procedure Date: 07/21/2018  Procedure(s):  Aortic Root replacement (Freestyle)/Implantation of coronary artery buttons, Circ arrest      Surgeon: Surgeon(s):  Randell Patient, PA-C  Azucena Cecil (Trip) Stan Head, MD  Norm Salt, PA-C    Post-Anesthesia Vitals  Pulse: 65 (06/24 1445)  Respirations: 16 PER MINUTE (06/24 1445)   Vitals Value Taken Time   BP 135/58    Temp 36 C (96.8 F) 07/21/2018  2:54 PM   Pulse 65 07/21/2018  2:45 PM   Respirations 16 PER MINUTE 07/21/2018  2:45 PM   SpO2 100 % 07/21/2018  2:54 PM   Vitals shown include unvalidated device data.      Post Anesthesia Evaluation Note    Evaluation location: ICU  Patient participation: patient intubated, unable to assess; expectation of recovery by ICU physician  Level of consciousness: intubated & sedated  Pain management: adequate    Hydration: normovolemia  Temperature: 36.0C - 38.4C  Airway patency: adequate    Perioperative Events       Post-op nausea and vomiting: no PONV    Postoperative Status  Cardiovascular status: hemodynamically stable  Respiratory status: ETT  ICU Information  VasoactiveDrips:norepinephrine infusion    Cardiac ICU information  ACT (sec)- 136  cardiac output assessed  cardiac index monitored    Staff involved in transport include: anesthesiologist, anes resident, other and OR nurse      Perioperative Events  Perioperative Event: No  Emergency Case Activation: No

## 2018-07-21 NOTE — Progress Notes
Cardiothoracic Surgery Critical Care   Progress Note     Joel Hernandez  Today's Date:  07/21/2018  Admission Date: 07/21/2018  LOS: 0 days    POD: 0    Procedure: Aortic Root replacement (Freestyle)/Implantation of coronary artery buttons, Circ arrest: 33405 (CPT???)    Principal Problem:    S/P AVR (aortic valve replacement) and aortoplasty  Active Problems:    Hypertension    Obstructive sleep apnea    Stenosis of lateral recess of lumbar spine    HLD (hyperlipidemia)    Acute on chronic diastolic CHF (congestive heart failure), NYHA class 2 (HCC)    S/p bare metal coronary artery stent    Intramural hematoma of thoracic aorta (HCC)    Pseudoaneurysm of aorta (HCC)      Assessment/Plan:      Neuro ??? Continue to wean from sedation to work towards extubation. Continue PRN Fentanyl, oxycodone with scheduled Tylenol.   CV ??? SR rate 60's, BP 109/42, CI 2.45, PA 24/13, CVP 8. Current gtts: on and off low dose levophed. Cont ASA 81 mg (hold for plts <80k) and statin. Hold BB and ACEI in the initial post op phase. Intra op TEE LVEF 70%, hyperdynamic LV, LVH 1.7 cm.   Resp ??? Daily CXR ??? bedside interpretation: lines and ETT in place, bibasilar atelectasis, small L pleural effusion. Will review radiology report. ABG: 7.36/40/410/22.7. Wean FiO2. Initiate IS, aggressive pulm toilet once extubated. Chest tubes in place - monitor output.   Renal ??? Baseline Scr 1.0-1.1. Monitor BMP, assess for AKI.   GI - ADAT, GI prophylaxis while intubated, start post op bowel regimen on POD 1.   ID ??? Continue standard post op antibiotic for prophylaxis, per CTS protocol.  Heme ??? Check post op CBC/coags, assess for coagulopathy. Hold DVT prophylaxis until cleared by CTS, continue mechanical prophylaxis. Need to discuss [facility] plans with CTS.   FEN ??? If creatinine < 2.0, replace Mg and K to minimize cardiac arrhythmias. Hgb A1c 5.2%. Insulin gtt per Modified [facility] Protocol.    Activity ??? HOB to 30 degrees over 1st postop hour. Bedrest until extubated, dangle 6 hours after extubation. Early cardiac PT/OT.     Prophylaxis Review:  Lines:  Yes; Arterial Line; Indication:  Frequent blood draws and Continuous BP monitoring; Location:  Radial  Central Line; Indication:  Frequent blood draws, Med not deliverable peripherally and Hemodynamic monitoring; Type:  Internal jugular  Antibiotic Usage:  No  VTE:  Mechanical prophylaxis; Foot pump  Urinary Catheter: Yes; Retain foley due to:  Need for accurate Intake and Output    Disposition: Plan as above, continue ICU care s/p AVR, root replacement in the immediate post-op phase.     I have seen, personally fully evaluated, and discussed patient with the critical care attending and cardiothoracic surgeon. The patient is critically ill, I spent 42 minutes (excluding time spent performing procedures) providing and personally directing critical care services including direct OR recovery, ventilator management, hemodynamic monitoring and management, lab and radiology review, medication review and management, fluid and electrolyte management and coordination of care.     Bland Span, APRN-NP   CTS Intensive Care  Pager [phone]  07/21/2018    Subjective:       HPI:   Joel Hernandez is a 78 y.o. male with pmhx of AS, CAD s/p PCI 04/2018, HTN, HLD, sleep apnea (doesn't need cpap after weight loss), former smoker, hyperthyroid, BPH. He was recently admitted for planned TAVR but  it was aborted r/t a pseudoaneurysm around the aortic annulus (thought to be from previous PCI in April). His eliquis was dc'ed and he was started on plavix. Now s/p AVR & root replacement today 6/24 with Dr. Shanon Brow. He was brought to the ICU following the procedure.     REVIEW OF SYSTEMS:   Review of systems not obtained from patient due to patient factors.      Objective:        Medications:  Scheduled Meds:acetaminophen (TYLENOL) tablet 1,000 mg, 1,000 mg, Oral, Q6H while awake [START ON 07/22/2018] amiodarone (CORDARONE) tablet 400 mg, 400 mg, Oral, BID  aspirin chewable tablet 81 mg, 81 mg, Oral, QDAY  atorvastatin (LIPITOR) tablet 40 mg, 40 mg, Oral, QHS  ceFAZolin (ANCEF) IVP 2 g, 2 g, Intravenous, Q8H*  famotidine (PEPCID) tablet 20 mg, 20 mg, Oral, BID    Or  famotidine (PEPCID) injection 20 mg, 20 mg, Intravenous, BID  magnesium sulfate   4 g/50 mL IVPB, 4 g, Intravenous, ONCE  [START ON 07/22/2018] methIMAzole (TAPAZOLE) tablet 10 mg, 10 mg, Oral, QDAY    And  methIMAzole (TAPAZOLE) tablet 15 mg, 15 mg, Oral, QDAY  [START ON 07/22/2018] senna/docusate (SENOKOT-S) tablet 2 tablet, 2 tablet, Oral, BID  tamsulosin (FLOMAX) capsule 0.4 mg, 0.4 mg, Oral, QHS    Continuous Infusions:  ??? aminocaproic acid (AMICAR) 12.5 g in sodium chloride 0.9% (NS) IV infusion Stopped (07/21/18 1449)   ??? dexMEDEtomidine (PRECEDEX) 400 mcg/NS 100 ml IV drip (premade) Stopped (07/21/18 1614)   ??? insulin regular 100 units/NS 100 mL IV drip (premade) Stopped (07/21/18 1540)   ??? nitroGLYCERIN 50 mg/D5W 250 mL infusion Stopped (07/21/18 1513)   ??? norepinephrine (LEVOPHED)   4 mg/250 mL NS IV drip (std conc)(premade) Stopped (07/21/18 1701)   ??? sodium chloride 0.9 %   infusion 30 mL/hr at 07/21/18 1451     PRN and Respiratory Meds:[START ON 07/26/2018] acetaminophen Q6H PRN **OR** [START ON 07/26/2018] acetaminophen Q6H PRN, alum/mag hydroxide/simeth Q4H PRN, atropine Once PRN, bisacodyL QDAY PRN, fentaNYL citrate PF Q1H PRN, hydrALAZINE Q6H PRN, lidocaine PF PRN, milk of magnesia (CONC) QDAY PRN, ondansetron Q6H PRN **OR** ondansetron (ZOFRAN) IV Q6H PRN, oxyCODONE Q4H PRN, potassium chloride SR PRN **OR** potassium chloride PRN **OR** potassium chloride in water PRN                       Vital Signs: Last Filed                  Vital Signs: 24 Hour Range   BP: 113/64 (06/24 1700)  Temp: 36.7 ???C (98.1 ???F) (06/24 1700)  Pulse: 64 (06/24 1700)  Respirations: 15 PER MINUTE (06/24 1700)  SpO2: 100 % (06/24 1700) SpO2 Pulse: 64 (06/24 1700)  Height: 185.4 cm (73) (06/24 0626) BP: (109-179)/(58-104)   ABP: (79-142)/(38-55)   Temp:  [35.9 ???C (96.6 ???F)-36.7 ???C (98.1 ???F)]   Pulse:  [52-66]   Respirations:  [12 PER MINUTE-17 PER MINUTE]   SpO2:  [95 %-100 %]      Vitals:    07/21/18 0626   Weight: 90.7 kg (199 lb 15.3 oz)           Intake/Output Summary:  (Last 24 hours)    Intake/Output Summary (Last 24 hours) at 07/21/2018 1707  Last data filed at 07/21/2018 1700  Gross per 24 hour   Intake 3305.87 ml   Output 1355 ml   Net 1950.87 ml  Physical Exam:         Neuro: Sedated, PERRL  Cardiovascular: RRR no rub or murmur  Respiratory: LS CTA bil - diminished in the bases  GI: soft, NT, hypoactive BS  Extremities: No Edema  Incisions: Sternal incision dressing, dry and intact. No crepitus or sternal instability.    Artificial airway:  Endotracheal Tube                                                                                         Vent weaning trial:  Per protocol    Laboratory:  LABS:  Recent Labs     07/21/18  1525   NA 142   K 3.8   CL 110   CO2 19*   GAP 13*   BUN 17   CR 1.01   GLU 84   CA 8.0*   MG 3.2*       Recent Labs     07/21/18  1525   WBC 17.2*   HGB 11.6*   HCT 34.7*   PLTCT 77*   INR 1.2   PTT 31.2      Estimated Creatinine Clearance: 78.6 mL/min (based on SCr of 1.01 mg/dL).  Vitals:    07/21/18 0626   Weight: 90.7 kg (199 lb 15.3 oz)    No results for input(s): PHART, PO2ART in the last 72 hours.    Invalid input(s): PC02A        Radiology and Other Diagnostic Procedures Review:    Reviewed

## 2018-07-22 DIAGNOSIS — I350 Nonrheumatic aortic (valve) stenosis: Principal | ICD-10-CM

## 2018-07-22 LAB — POC BLOOD GAS ARTERIAL
Lab: 114 mmHg — ABNORMAL HIGH (ref 80–100)
Lab: 21 MMOL/L (ref 21–28)
Lab: 44 mmHg (ref 35–45)
Lab: 5 MMOL/L — ABNORMAL LOW (ref 21–28)
Lab: 7.3 mmHg — ABNORMAL LOW (ref 7.35–7.45)
Lab: 98 % (ref 95–99)
Lab: 21 MMOL/L (ref 21–28)
Lab: 4 MMOL/L
Lab: 41 mmHg (ref 35–45)
Lab: 7.3 — ABNORMAL LOW (ref 7.35–7.45)
Lab: 99 % (ref 95–99)

## 2018-07-22 LAB — POC GLUCOSE
Lab: 120 mg/dL — ABNORMAL HIGH (ref 70–100)
Lab: 126 mg/dL — ABNORMAL HIGH (ref 70–100)
Lab: 130 mg/dL — ABNORMAL HIGH (ref 70–100)
Lab: 139 mg/dL — ABNORMAL HIGH (ref 70–100)
Lab: 153 mg/dL — ABNORMAL HIGH (ref 70–100)
Lab: 146 mg/dL — ABNORMAL HIGH (ref 70–100)
Lab: 147 mg/dL — ABNORMAL HIGH (ref >60)

## 2018-07-22 LAB — POTASSIUM: Lab: 4.1 MMOL/L (ref 3.5–5.1)

## 2018-07-22 LAB — CBC: Lab: 14 K/UL — ABNORMAL HIGH (ref 4.5–11.0)

## 2018-07-22 LAB — BLOOD GASES, ARTERIAL
Lab: 21 MMOL/L (ref 21–28)
Lab: 3.9 MMOL/L
Lab: 37 mmHg (ref 35–45)
Lab: 7.3 (ref 7.35–7.45)

## 2018-07-22 LAB — BASIC METABOLIC PANEL: Lab: 141 MMOL/L — ABNORMAL HIGH (ref 137–147)

## 2018-07-22 LAB — MAGNESIUM: Lab: 3 mg/dL — ABNORMAL HIGH (ref 1.6–2.6)

## 2018-07-22 MED ORDER — CEFAZOLIN INJ 1GM IVP
2 g | Freq: Once | INTRAVENOUS | 0 refills | Status: AC
Start: 2018-07-22 — End: ?

## 2018-07-22 MED ORDER — ALBUMIN, HUMAN 5 % IV SOLP
500 mL | Freq: Once | INTRAVENOUS | 0 refills | Status: CP
Start: 2018-07-22 — End: ?
  Administered 2018-07-22: 06:00:00 500 mL via INTRAVENOUS

## 2018-07-22 MED ORDER — CEFAZOLIN INJ 1GM IVP
1 g | INTRAVENOUS | 0 refills | Status: AC
Start: 2018-07-22 — End: ?

## 2018-07-22 MED ORDER — PATCH DOCUMENTATION - LIDOCAINE 5%
Freq: Two times a day (BID) | TRANSDERMAL | 0 refills | Status: DC
Start: 2018-07-22 — End: 2018-08-05

## 2018-07-22 MED ORDER — LIDOCAINE (PF) 10 MG/ML (1 %) IJ SOLN
.1-2 mL | INTRAMUSCULAR | 0 refills | Status: DC | PRN
Start: 2018-07-22 — End: 2018-07-25

## 2018-07-22 MED ORDER — FUROSEMIDE 10 MG/ML IJ SOLN
20 mg | Freq: Once | INTRAVENOUS | 0 refills | Status: CP
Start: 2018-07-22 — End: ?
  Administered 2018-07-22: 20 mg via INTRAVENOUS

## 2018-07-22 MED ORDER — CLOPIDOGREL 75 MG PO TAB
75 mg | Freq: Every day | ORAL | 0 refills | Status: DC
Start: 2018-07-22 — End: 2018-08-05
  Administered 2018-07-22 – 2018-08-05 (×15): 75 mg via ORAL

## 2018-07-22 MED ORDER — INSULIN ASPART 100 UNIT/ML SC FLEXPEN
0-12 [IU] | Freq: Before meals | SUBCUTANEOUS | 0 refills | Status: DC
Start: 2018-07-22 — End: 2018-07-25

## 2018-07-22 MED ORDER — LIDOCAINE 5 % TP PTMD
1-2 | Freq: Every day | TOPICAL | 0 refills | Status: DC
Start: 2018-07-22 — End: 2018-08-05
  Administered 2018-07-22: 14:00:00 1 via TOPICAL
  Administered 2018-07-24: 13:00:00 2 via TOPICAL
  Administered 2018-07-25 – 2018-08-01 (×7): 1 via TOPICAL
  Administered 2018-08-02: 13:00:00 2 via TOPICAL
  Administered 2018-08-03 – 2018-08-05 (×3): 1 via TOPICAL

## 2018-07-22 NOTE — Progress Notes
Assumed care of patient for pacemaker requirements.     Pt alert, oriented, sitting up in chair. Afebrile, minimal incisional pain. Lungs clear, RA. SR rate 70s, SBP 130-140s per cuff. Dopplered pedal pulses. Abdomen round, soft, hypoactive. Foley with adequate dark yellow UO ~30-45 ml/hr. 2 med CT ~0-20 ml/hr SS drainage. Son at bedside, updated on status.    Current gtts: NS 10    1020- Pt dropped HR to 35, BP 90s/40-50s MAP low 60s. Pt asymptomatic with hemodynamic change. Epicardial wires did not capture even though output and sensitivity were checked not two hours ago. Replaced A wire cable, rechecked output and sensitivity. Pt recovered to SR 70s within 10 mins. Epicardial backup AAI rate 50, output 6, sens 2. EKG hooked up to patient in hopes to catch rhythm change. NP at bedside and aware.

## 2018-07-22 NOTE — Progress Notes
RT Adult Assessment Note    NAME:Joel Hernandez             MRN: [id number]             DOB:1940-06-26          AGE: 78 y.o.  ADMISSION DATE: 07/21/2018             DAYS ADMITTED: LOS: 0 days    RT Treatment Plan:       Protocol Plan: Procedures  PEP Therapy: Q4h while awake & PRN  PAP: Q4h while awake & PRN  Oxygen/Humidity: O2 to keep SpO2 > 95%, if less than 24 hours post op, then keep SpO2 greater than 92%  SpO2: BID & PRN    Additional Comments:  Impressions of the patient: Pt resting on 2L NC post extubation, achieving 1500-2000 mL on IS. PEP/PAP instructed and performer per CTS protocol. No significant pulmonary history.  Intervention(s)/outcome(s): Plan as above, titrate O2.  Patient education that was completed: PEP/PAP/IS instruct.  Recommendations to the care team: NA    Vital Signs:  Pulse: 76  RR: 18 PER MINUTE  SpO2: 97 %  O2 Device: Cannula  Liter Flow: 2 Lpm  O2%:    Breath Sounds:    Respiratory Effort: Non-Labored

## 2018-07-22 NOTE — Progress Notes
Joel Hernandez is a 78 y.o. male with pmhx of AS, CAD s/p PCI 04/2018, HTN, HLD, sleep apnea (doesn't need cpap after weight loss), former smoker, hyperthyroid, BPH. 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 His eliquis was dc'ed and he was started on plavix. Now s/p AVR & root replacement today 6/24 with Dr. Azzie Glatter. He was brought to the ICU following the procedure.     Given 500 of volume overnight. Remained stable, No pressors, CI: 3.1-3.4, PAP: 20/10s, CVP;5-8, CT: 520cc,CXR: stable, some pneumopericardium,  UOP: 1.8L, I/O: +2.9L, Hgb 1.6 from 11.6        Keep chest tubes  Ambulation  Lasix  BB  Start coumadin 3  Flomax for BPH  Tele status    Orson Aloe, MD  CTS Fellow  [phone]

## 2018-07-22 NOTE — Progress Notes
Critical Care Progress Note          Today's Date:  07/22/2018  Name:  Joevanny Nabozny                       MRN:  [id number]   Admission Date: 07/21/2018  LOS: 1 day                     Assessment/Plan:   Principal Problem:    S/P AVR (aortic valve replacement) and aortoplasty  Active Problems:    Hypertension    Obstructive sleep apnea    Stenosis of lateral recess of lumbar spine    HLD (hyperlipidemia)    Acute on chronic diastolic CHF (congestive heart failure), NYHA class 2 (HCC)    S/p bare metal coronary artery stent    Intramural hematoma of thoracic aorta (HCC)    Pseudoaneurysm of aorta (HCC)    Complete heart block (HCC)    On mechanical ventilation    Vasogenic shock    Hypothyroidism    78 year old male significant past medical history for coronary disease status post PCI on 04/2018, aortic stenosis, hypertension, hyperlipidemia, OSA, hypothyroidism and BPH.  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  Patient underwent AVR and root replacement on 6/24.  He was admitted to CTICU for post operative management.    Neuro: PRN pain meds as needed.   Cardiac: Intraoperative TEE with hyperdynamic LV with thickened walls, RV with normal function, no hemodynamicly significant valvulopathy.  PAC in place for hemodynamic monitoring.  Cardiac indices stable.  Weaned off vasoactive medications.   Pulmonary: Currently on 2L NC. IS/Pulm Toilet.   FEN: ADAT. Bowel regimen ordered.  ID:  Afebrile. Ancef for surgical prophylaxis. Monitor fever curve.  Renal: Monitor strict I/O and renal function. At risk for AKI. Trend BMP. Replete electrolytes PRN.  Heme:  Postop hemoglobin 11.6.  Monitor for signs/symptoms of active bleeding.  Daily CBC.  Holding chemical DVT prophylaxis in setting of recent surgery. No coumadin per CTS.   Endo:  Modified [facility] Insulin Protocol.   Insulin drip ordered.  Continue PTA methimazole. Prophylaxis: HOB>40, H2 blocker, DVT prophylaxis per primary team  Disposition/Family:  Needs ICU immediately status post AVR with root replacement  __________________________________________________________________________________    Prophylaxis Review:  Lines:  Yes; Arterial Line; Indication:  Frequent blood draws and Continuous BP monitoring; Location:  Radial  Central Line with PAC; Indication:  Hemodynamic monitoring; Type:  Internal jugular  Urinary Catheter:  Yes; Retain foley due to:  Need for accurate Intake and Output  Antibiotic Usage: Surgical prophylaxis  VTE: SCDs    Lab Review:  Pertinent labs reviewed  Point of Care Testing:  (Last 24 hours):  Glucose: (!) 149 (07/22/18 0305)  POC Glucose (Download): (!) 146 (07/22/18 1205)    Radiology and Other Diagnostic Procedures Review:    Pertinent radiology reviewed.    Date of Service: 07/22/18    I saw this patient today on multidisciplinary rounds in conjunction with the CTICU team and bedside nurse. This patient is critically ill due to aortic valve replacement with postoperative vasogenic shock with dysfunction of at least one organ system.  I provided and personally directed critical care services including Ventilator Management, Invasive Hemodynamic Management, Management of Cardiac Support Infusions, Management of Vasogenic Shock, Comprehensive Pain Management and Complex Fluid & Electrolyte Management and the patient is at risk for life threatening deterioration necessitating complex  medical decision making and ongoing provision of ICU care    Critical Care Time: 43 minutes  Gabriel Cirri, MD  Assistant Professor  Anesthesiology and Critical Care

## 2018-07-22 NOTE — Case Management (ED)
Case Management Admission Assessment    NAME:Joel Hernandez                          MRN: [id number]             DOB:[birth year]          AGE: 78 y.o.  ADMISSION DATE: 07/21/2018             DAYS ADMITTED: LOS: 1 day      Today???s Date: 07/22/2018    Source of Information: Patient's Son; EMR       Plan  Plan: Case Management Assessment, Assist PRN with SW/NCM Services     Patient is a 78 y.o.???male???who was here in May 2020 to undergo an???elective transcatheter aortic valve replacement???but this???procedure was aborted because the preoperative TEE found???an annular hematoma.???It was felt this may be related to his recent heart catheterization and PCI placement. He was discharged to home and his Eliquis was held and he was sent home on Plavix.???    Pt is now s/p 1 from TAVR and continues in ICU at this time.    SW had to complete initial assessment this afternoon with pt's son d/t pt not being available by phone for same.  SW introduced herself and discussed her role in the dc planning process.  SW confirmed that pt's demographic information has not changed since his last admission when he was originally scheduled to complete his TAVR but the procedure had to be aborted.  Pt's son reports pt lives alone in his one level home in [place].  Son and his wife live in [place] as well.  He reports he can be available for assistance and stay with pt if needed but he does work every 3rd day and will be unavailable at those times.  Pt has access to a RW, SPC at home.  He was fully independent at time of admit.  He has no hx of SNF/IPR/LTACH.  He has used HH in the past after his knee replacement but son does not recall the name of the agency used.    ???    Patient Address/Phone  [address]  [place] [address]  [phone] (home)     Emergency Contact  Extended Emergency Contact Information  Primary Emergency Contact: Donterrious, [place]us [place]  Home Phone: [phone]  Relation: Son Secondary Emergency Contact: Lyam, [place]  Mobile Phone: [phone]  Relation: Daughter    Network engineer Directive: Yes, patient has a healthcare directive  Type of Healthcare Directive: Durable power of attorney for healthcare  Location of Healthcare Directive: Current and verified in document scanning system  Would patient like to fill out a (a new) Editor, commissioning?: No, patient declined  Lawyer (Psych unit only): No, patient does not have a Social research officer, government  Does the patient need discharge transport arranged?: No  Transportation Name, Phone and Availability #1: Son - Trevahn Weismiller - [phone]  Does the patient use Medicaid Transportation?: No    Expected Discharge Date  Expected Discharge Date: 07/24/18  Expected Discharge Time: 1400    Living Situation Prior to Admission  ? Living Arrangements  Type of Residence: Home, independent  Living Arrangements: Alone  Financial risk analyst / Tub: Tub/Shower Unit  How many levels in the residence?: 1  Can patient live on one level if needed?: Yes  Does residence have  entry and/or side stairs?: No  Assistance needed prior to admit or anticipated on discharge: Yes  Who provides assistance or could if needed?: Son - Sy Saintjean - [phone]  Are they in good health?: Yes(Pt cannot recall name of agency.  HH was arranged by [facility])  Can support system provide 24/7 care if needed?: No(Son can stay with pt as needed, but works every 3rd day and and will not be available on those days)  ? Level of Function   Prior level of function: Independent  ? Cognitive Abilities   Cognitive Abilities: Continue to Assess(Pt was not available by phone so SW had to complete initial assessment with pt's son)    Surveyor, quantity Resources  ? Coverage  Primary Insurance: Medicare(Part A and B)  Secondary Insurance: Commercial insurance(UHC) Additional Coverage: RX(Part D Plan with [employer] - Pt's son reports all medications are affordable)    ? Source of Income   Source Of Income: SSI  ? Financial Assistance Needed?  N/A    Psychosocial Needs  ? Mental Health  Mental Health History: No  ? Substance Use History  Substance Use History Screen: No  ? Other  N/A    Current/Previous Services  ? PCP  Lavone Orn, [phone], [phone]  ? Pharmacy    [phone] [place] [place] - [address]  [address]  [place] [address]  Phone: [phone] Fax: [phone]    [facility] [address] [place] - [address]  [place] [address]  Phone: [phone] Fax: [phone]    [facility] [place], [place] - [address] Portal to Registered Caremark Sites  [address] [place] [address]  Phone: [phone] Fax: [phone]    [facility] [place] - [address]  [address] [place] [address]  Phone: [phone] Fax: [phone]    [facility]  [address]  [place] [address]  Phone: [phone] Fax: [phone]    ? Durable Psychologist, educational at home: [employer], Single [employer]  ? Home Health  Receiving home health: In the past(Does not recall name of agency; Son reports it was arranged by [facility])  ? Hemodialysis or Peritoneal Dialysis  Undergoing hemodialysis or peritoneal dialysis: No  ? Tube/Enteral Feeds  Receive tube/enteral feeds: No  ? Infusion  Receive infusions: No  ? Private Duty  Private duty help used: No  ? Home and Community Based Services  Home and community based services: No  ? [employer]: N/A  ? Hospice  Hospice: No  ? Outpatient Therapy  PT: No  OT: No  SLP: No  ? Skilled Nursing Facility/Nursing Home  SNF: No  NH: No  ? Inpatient Rehab  IPR: No  ? Long-Term Acute Care Hospital  LTACH: No  ? Acute Hospital Stay Acute Hospital Stay: Yes  Was patient's stay within the last 30 days?: No    Gweneth Dimitri, LMSW  Surgery - Cardiothoracic/Vascular  Social Work Case Manager  *[phone]

## 2018-07-22 NOTE — Care Plan
Pt participates as able. Foley wipes used. Aseptic technique used. No skin injury noted.

## 2018-07-22 NOTE — Progress Notes
Critical Care Progress Note          Today's Date:  07/21/2018  Name:  Selestino Nila                       MRN:  [id number]   Admission Date: 07/21/2018  LOS: 0 days                     Assessment/Plan:   Principal Problem:    S/P AVR (aortic valve replacement) and aortoplasty  Active Problems:    Hypertension    Obstructive sleep apnea    Stenosis of lateral recess of lumbar spine    HLD (hyperlipidemia)    Acute on chronic diastolic CHF (congestive heart failure), NYHA class 2 (HCC)    S/p bare metal coronary artery stent    Intramural hematoma of thoracic aorta (HCC)    Pseudoaneurysm of aorta (HCC)    On mechanical ventilation    Vasogenic shock    Hypothyroidism    78 year old male significant past medical history for coronary disease status post PCI on 04/2018, aortic stenosis, hypertension, hyperlipidemia, OSA, hypothyroidism and BPH.  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  Patient underwent AVR and root replacement on 6/24.  He was admitted to CTICU for post operative management.    Neuro: Status post anesthesia, emerging.  On low-dose Precedex, wean to facilitate neurological exam and work towards extubation.  PRN pain meds.  Cardiac: Intraoperative TEE with hyperdynamic LV with thickened walls, RV with normal function, no hemodynamicly significant valvulopathy.  PAC in place for hemodynamic monitoring.  Cardiac indices stable.  On low-dose norepinephrine, wean as able for goal MAP > 65.  Goal S aspirin and statin ordered.BP < 130.    Pulmonary: On volume control ventilation.  PSV when more awake and alert in order to work towards extubation.  Will need aggressive pulmonary hygiene post extubation.  Consider noninvasive ventilation given history of OSA.  FEN: NPO.  ADAT post extubation.  Bowel regimen ordered.  ID: Afebrile.  Ancef for surgical prophylaxis.  Monitor fever curve. Renal: Monitor strict I/os and renal function.  At risk for AKI.  Trend BMP.  Replete electrolytes PRN.  Heme:  Postop hemoglobin 11.6.  Monitor for signs/symptoms of active bleeding.  Daily CBC.  Holding chemical DVT prophylaxis in setting of recent surgery.  Endo:  Modified [facility] Insulin Protocol.   Insulin drip ordered.  Continue PTA methimazole.  Prophylaxis: HOB>40, H2 blocker, DVT prophylaxis per primary team  Disposition/Family:  Needs ICU immediately status post AVR with root replacement  __________________________________________________________________________________    Prophylaxis Review:  Lines:  Yes; Arterial Line; Indication:  Frequent blood draws and Continuous BP monitoring; Location:  Radial  Central Line with PAC; Indication:  Hemodynamic monitoring; Type:  Internal jugular  Urinary Catheter:  Yes; Retain foley due to:  Need for accurate Intake and Output  Antibiotic Usage: Surgical prophylaxis  VTE: SCDs    Lab Review:  Pertinent labs reviewed  Point of Care Testing:  (Last 24 hours):  Glucose: 84 (07/21/18 1525)  POC Glucose (Download): (!) 115 (07/21/18 1819)    Radiology and Other Diagnostic Procedures Review:    Pertinent radiology reviewed.    Date of Service: 07/21/18    I saw this patient today on multidisciplinary rounds in conjunction with the CTICU team and bedside nurse. This patient is critically ill due to aortic valve replacement with postoperative vasogenic  shock with dysfunction of at least one organ system.  I provided and personally directed critical care services including Ventilator Management, Invasive Hemodynamic Management, Management of Cardiac Support Infusions, Management of Vasogenic Shock, Comprehensive Pain Management and Complex Fluid & Electrolyte Management and the patient is at risk for life threatening deterioration necessitating complex medical decision making and ongoing provision of ICU care    Critical Care Time: 49 minutes  Wonda Cheng, MD Assistant Professor  Anesthesiology and Critical Care

## 2018-07-22 NOTE — Progress Notes
-  Pt extubated at 2135. Lethargic, orientedx4. MAE, FC. Temp 38, scheduled tylenol given. PERRL. Baseline numbness in right foot d/t foot drop. No c/o pain at this time.   -SR 70s. MAP>65, titrating phenyl. SBP 110s-120s. CI 2.74. PAPs 20s/10s. CVP 5-8. Doppler pedal pulses.  -Lung sounds clear/dim. 2L NC.  -Bowel sounds hypoactive.   -Adequate UOP.   -CT output 20-44ml/hr.    Gtts:  Phenyl 0.2mcg  NS 74ml

## 2018-07-22 NOTE — Progress Notes
-  Pt up to the chair this morning, tolerated well. VSS.  -SR 60s. MAP>65. SBP 130s.   -1L NC.  -Tolerating clear liquids.   -Adequate UOP.   -CT output 10-89ml/hr.     Gtts:  NS 15ml

## 2018-07-23 ENCOUNTER — Encounter: Admit: 2018-07-25 | Discharge: 2018-07-25

## 2018-07-23 ENCOUNTER — Encounter: Admit: 2018-07-23 | Discharge: 2018-07-23

## 2018-07-23 DIAGNOSIS — C4490 Unspecified malignant neoplasm of skin, unspecified: Secondary | ICD-10-CM

## 2018-07-23 DIAGNOSIS — I350 Nonrheumatic aortic (valve) stenosis: Principal | ICD-10-CM

## 2018-07-23 DIAGNOSIS — I2510 Atherosclerotic heart disease of native coronary artery without angina pectoris: Secondary | ICD-10-CM

## 2018-07-23 DIAGNOSIS — I4891 Unspecified atrial fibrillation: Secondary | ICD-10-CM

## 2018-07-23 DIAGNOSIS — C44621 Squamous cell carcinoma of skin of unspecified upper limb, including shoulder: Secondary | ICD-10-CM

## 2018-07-23 DIAGNOSIS — R011 Cardiac murmur, unspecified: Secondary | ICD-10-CM

## 2018-07-23 DIAGNOSIS — E785 Hyperlipidemia, unspecified: Secondary | ICD-10-CM

## 2018-07-23 DIAGNOSIS — E079 Disorder of thyroid, unspecified: Secondary | ICD-10-CM

## 2018-07-23 DIAGNOSIS — M549 Dorsalgia, unspecified: Secondary | ICD-10-CM

## 2018-07-23 DIAGNOSIS — I10 Essential (primary) hypertension: Secondary | ICD-10-CM

## 2018-07-23 DIAGNOSIS — I724 Aneurysm of artery of lower extremity: Secondary | ICD-10-CM

## 2018-07-23 DIAGNOSIS — I214 Non-ST elevation (NSTEMI) myocardial infarction: Secondary | ICD-10-CM

## 2018-07-23 DIAGNOSIS — G4730 Sleep apnea, unspecified: Secondary | ICD-10-CM

## 2018-07-23 DIAGNOSIS — G4733 Obstructive sleep apnea (adult) (pediatric): Secondary | ICD-10-CM

## 2018-07-23 LAB — CBC
Lab: 14 10*3/uL — ABNORMAL HIGH (ref 4.5–11.0)
Lab: 3.3 M/UL — ABNORMAL LOW (ref 4.4–5.5)

## 2018-07-23 LAB — POC GLUCOSE
Lab: 134 mg/dL — ABNORMAL HIGH (ref 70–100)
Lab: 110 mg/dL — ABNORMAL HIGH (ref 70–100)
Lab: 113 mg/dL — ABNORMAL HIGH (ref 70–100)
Lab: 103 mg/dL — ABNORMAL HIGH (ref 70–100)

## 2018-07-23 LAB — BASIC METABOLIC PANEL
Lab: 135 MMOL/L — ABNORMAL LOW (ref 137–147)
Lab: 3.9 MMOL/L (ref 3.5–5.1)

## 2018-07-23 LAB — MAGNESIUM: Lab: 2.5 mg/dL (ref >60)

## 2018-07-23 MED ORDER — SENNOSIDES-DOCUSATE SODIUM 8.6-50 MG PO TAB
2 | Freq: Two times a day (BID) | ORAL | 0 refills | Status: CN
Start: 2018-07-23 — End: ?

## 2018-07-23 MED ORDER — ASPIRIN 81 MG PO CHEW
81 mg | ORAL_TABLET | Freq: Every day | ORAL | 0 refills | Status: CN
Start: 2018-07-23 — End: ?

## 2018-07-23 MED ORDER — DOCUSATE SODIUM 100 MG PO CAP
100 mg | Freq: Two times a day (BID) | ORAL | 0 refills | Status: DC
Start: 2018-07-23 — End: 2018-08-05
  Administered 2018-07-24 – 2018-07-27 (×6): 100 mg via ORAL

## 2018-07-23 NOTE — Progress Notes
Cardiothoracic Surgery Critical Care Progress Note     Joel Hernandez  Today's Date:  07/23/2018  Admission Date: 07/21/2018  LOS: 2 days    Procedure: Aortic Root replacement (Freestyle)/Implantation of coronary artery buttons, Circ arrest: 33405 (CPT???)  POD#: 2    Principal Problem:    S/P AVR (aortic valve replacement) and aortoplasty  Active Problems:    Hypertension    Obstructive sleep apnea    Stenosis of lateral recess of lumbar spine    Thrombocytopenia (HCC)    HLD (hyperlipidemia)    Acute on chronic diastolic CHF (congestive heart failure), NYHA class 2 (HCC)    S/p bare metal coronary artery stent    Intramural hematoma of thoracic aorta (HCC)    Pseudoaneurysm of aorta (HCC)    Complete heart block (HCC)    Junctional rhythm        Assessment/Plan:      Neuro ??? Pain controlled.  Continue PRN oxycodone with scheduled Tylenol.   CV ??? Intermittent episodes of CHB and junctional rhythm.  EP following, tentative plan for PPM today. BP 119/51 (70). Hold all AV nodal blocking agents for now. Continue to monitor hemodynamic stability, rhythm changes.  Cont ASA 81mg  (hold for plts <80k) and statin. Hold ACEI in post op phase. Intra op TEE LVEF 70%, hyperdynamic LV, LVH 1.7 cm.   Resp ??? Daily CXR ??? bedside interpretation:  Lungs expanded, bilateral effusion/atelectasis, will review radiology report. On RA. Cont IS, aggressive pulm toilet. Chest tubes plan: remove today.  Renal ??? Baseline Cr 1-1.1-->1.14-->1.17 today.  Lasix x 1 overnight. 1.1L UOP/24hrs, overall - .  Goal for euvolemia today, volume status tenuous given severe LVH, monitor closely.  Monitor BMP, assess for AKI. Discontinue foley catheter.  GI - NPO for PPM. GI prophylaxis if indicated, continue post op bowel regimen.   ID ??? Continue standard post op antibiotic for prophylaxis, per CTS protocol.   Heme ??? Hgb 11, continue to monitor acute blood loss anemia. Continue Plavix and ASA (recent PCI).  Monitor platelets closely, 72-->58 today. FEN ??? replace Mg and K to minimize the risk of cardiac arrhythmias. A1c 5.2, continue SSI today.     Activity ??? Pt up to chair today, should ambulate in halls with nursing and cardiac rehab. Early cardiac PT/OT.   Disposition: Plan as above. Continue ICU care while pt critically ill s/p AVR/Root with intermittent complete heart block/junctional rhythm.  Consider transfer to tele if stable following PPM placement.      I have seen, personally fully evaluated, and discussed patient with the critical care attending and cardiothoracic surgeon. The patient is critically ill and at risk for life threatening deterioration. I spent 45 minutes (excluding time spent performing or supervising any procedures) providing and personally directing critical care services including hemodynamic monitoring and management, lab and radiology review, medication review and management, fluid and electrolyte management and coordination of care.     Prophylaxis Review:  Lines:  Yes; Central Line; Indication:  Hemodynamic monitoring; Type:  Internal jugular  Antibiotic Usage:  No  VTE:  Mechanical prophylaxis; Foot pump  Urinary Catheter: No    Starling Manns, APRN   CTS Intensive Care  Pager [phone]  07/23/2018    Subjective:       HPI:   Joel Hernandez is a 78 y.o. male with pmhx of AS, CAD s/p PCI 04/2018, HTN, HLD, sleep apnea (doesn't need cpap after weight loss), former smoker, hyperthyroid, BPH. He was recently admitted  for planned TAVR but it was aborted r/t a pseudoaneurysm around the aortic annulus (thought to be from previous PCI in April). His eliquis was dc'ed and he was started on plavix. Now s/p AVR & root replacement today 6/24 with Dr. Helen Hashimoto. He was brought to the ICU following the procedure.     POD 1:  CHB  POD 2:  Plan for PPM, CTs discontinued    REVIEW OF SYSTEMS:   Constitutional: positive for fatigue, negative for chills and sweats  Respiratory: negative for cough, sputum or increased work of breathing Cardiovascular: negative for chest pain, chest pressure/discomfort, palpitations, tachypnea  Gastrointestinal: negative for dysphagia, nausea, vomiting and abdominal pain  Musculoskeletal:negative for muscle weakness  Neurological: negative for memory problems, speech problems and coordination problems      Objective:        Medications:  Scheduled Meds:acetaminophen (TYLENOL) tablet 1,000 mg, 1,000 mg, Oral, Q6H while awake  aspirin chewable tablet 81 mg, 81 mg, Oral, QDAY  atorvastatin (LIPITOR) tablet 40 mg, 40 mg, Oral, QHS  ceFAZolin (ANCEF) IVP 2 g, 2 g, Intravenous, ONCE    Followed by  ceFAZolin (ANCEF) IVP 1 g, 1 g, Intravenous, Q8H*  clopiDOGrel (PLAVIX) tablet 75 mg, 75 mg, Oral, QDAY  insulin aspart U-100 (NOVOLOG FLEXPEN) injection PEN 0-12 Units, 0-12 Units, Subcutaneous, ACHS (22)  lidocaine (LIDODERM) 5 % topical patch 1-2 patch, 1-2 patch, Topical, QDAY    And  Verification of Patch Placement and Integrity - Lidocaine 5%, , Transdermal, BID  methIMAzole (TAPAZOLE) tablet 10 mg, 10 mg, Oral, QDAY    And  methIMAzole (TAPAZOLE) tablet 15 mg, 15 mg, Oral, QDAY  senna/docusate (SENOKOT-S) tablet 2 tablet, 2 tablet, Oral, BID  tamsulosin (FLOMAX) capsule 0.4 mg, 0.4 mg, Oral, QHS    Continuous Infusions:  ??? sodium chloride 0.9 %   infusion 30 mL/hr at 07/21/18 1910     PRN and Respiratory Meds:[START ON 07/26/2018] acetaminophen Q6H PRN **OR** [START ON 07/26/2018] acetaminophen Q6H PRN, alum/mag hydroxide/simeth Q4H PRN, bisacodyL QDAY PRN, hydrALAZINE Q6H PRN, lidocaine PF PRN, milk of magnesia (CONC) QDAY PRN, ondansetron Q6H PRN **OR** ondansetron (ZOFRAN) IV Q6H PRN, oxyCODONE Q4H PRN, potassium chloride SR PRN **OR** potassium chloride PRN **OR** potassium chloride in water PRN                       Vital Signs: Last Filed                  Vital Signs: 24 Hour Range   BP: 135/68 (06/26 1200)  Temp: 36.7 ???C (98.1 ???F) (06/26 1200)  Pulse: 60 (06/26 1200)  Respirations: 24 PER MINUTE (06/26 1200) SpO2: 97 % (06/26 1200)  SpO2 Pulse: 60 (06/26 1200) BP: (109-147)/(51-93)   Temp:  [36.7 ???C (98.1 ???F)-37 ???C (98.6 ???F)]   Pulse:  [50-73]   Respirations:  [14 PER MINUTE-30 PER MINUTE]   SpO2:  [54 %-98 %]    Intensity Pain Scale (Self Report): 6 (07/23/18 0417) Vitals:    07/21/18 0626   Weight: 90.7 kg (199 lb 15.3 oz)           Intake/Output Summary:  (Last 24 hours)    Intake/Output Summary (Last 24 hours) at 07/23/2018 1252  Last data filed at 07/23/2018 1100  Gross per 24 hour   Intake 670 ml   Output 1175 ml   Net -505 ml         Physical Exam:  Neuro: A&O x 4, MAE =  Cardiovascular: V paced at 60, underlying junctional, no rub or murmur  Respiratory: LS CTA bil - diminished in the bases  GI: soft, NT, hypoactive BS  Extremities: No Edema  Incisions: Sternal incision dressing, dry and intact. No crepitus or sternal instability.      Chest Tubes OUTPUT/24 HOURS AIR LEAK PRESENT   Mediastinal 210 n   Mediastinal     Pleural      Pleural        Pertinent Meds:               Taking    Reason for Not Taking  1. Aspirin yes    2. B-Blocker no CHB   3. Statin yes    4. ACE/ARB       Beta blocker held due to bradyarrhythmias or high grade block.    Artificial airway:  None                                                                                         Vent weaning trial:  Not applicable    LABS:  Recent Labs     07/21/18  1525 07/21/18  2215 07/22/18  0305 07/23/18  0335   NA 142  --  141 135*   K 3.8 4.1 4.2 3.9   CL 110  --  110 105   CO2 19*  --  21 22   GAP 13*  --  10 8   BUN 17  --  18 26*   CR 1.01  --  1.14 1.17   GLU 84  --  149* 119*   CA 8.0*  --  8.2* 8.4*   MG 3.2*  --  3.0* 2.5       Recent Labs     07/21/18  1525 07/22/18  0305 07/23/18  0335   WBC 17.2* 14.9* 14.6*   HGB 11.6* 10.6* 11.0*   HCT 34.7* 30.6* 32.3*   PLTCT 77* 72* 58*   INR 1.2  --   --    PTT 31.2  --   --       Estimated Creatinine Clearance: 67.8 mL/min (based on SCr of 1.17 mg/dL).  Vitals:    07/21/18 0626 Weight: 90.7 kg (199 lb 15.3 oz)      Recent Labs     07/21/18  2215   PHART 7.36   PO2ART 128*           Radiology and Other Diagnostic Procedures Review:    Reviewed

## 2018-07-23 NOTE — Progress Notes
Brief EP Note    Telemetry reviewed this morning.  Patient did require pacing at 50 bpm overnight.  Pacer turned down to 30 this morning.  Will not pursue permanent pacemaker implantation today as it is primary team's preference to continue to monitor for recovery.  We will follow over the weekend and determine final plans on Monday.    Patient does have history of paroxysmal atrial fibrillation for which he was on amiodarone PTA.  According to the patient, he has been off of amiodarone for "weeks".  If he has recurrence of atrial fibrillation, would recommend avoiding amiodarone for rhythm control and would favor AAD such as Tikosyn as he should avoid any AV nodal blocking agents.    We will follow along with you.    Please reach out with any new developments.    Horton Finer, APRN-C (personal pgr [phone])  [facility] (team pgr [phone])

## 2018-07-23 NOTE — Progress Notes
0600 Patient up to the chair. Tolerated transfer well. Vital signs stable.

## 2018-07-23 NOTE — Discharge Instructions - Pharmacy
Physician Discharge Summary    Name: Joel Hernandez  Medical Record Number: [id number]        Account Number:  [id number]  Date Of Birth:  August 02, 1940                         Age:  78 years   Admit date:  07/21/2018                     Discharge date:  08/05/2018    Attending Physician:  Earlene Plater (Trip) L II*                 Physician Summary completed by: Anderson Malta, PA-C    Reason for hospitalization: Severe aortic stenosis [I35.0]    Significant PMH:   Medical History:   Diagnosis Date   ??? Aortic stenosis    ??? Atrial fibrillation (HCC)    ??? Back pain    ??? CAD (coronary artery disease)    ??? Disorder of thyroid gland    ??? Heart murmur 09/15/2013   ??? Hyperlipidemia    ??? Hypertension 09/15/2013   ??? NSTEMI (non-ST elevated myocardial infarction) (HCC) 12/2015    s/p stent placement   ??? OSA on CPAP     No CPAP use after 55lb weight loss   ??? 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 SCC (squamous cell carcinoma), arm    ??? Severe aortic stenosis    ??? Skin cancer    ??? Sleep apnea          Allergies: Lisinopril    Admission Physical Exam notable for:  Severe aortic stenosis [I35.0]    Admission Lab/Radiology studies notable for: plt 116, crt 1.17, hgb a1c 5.2%, BNP 129    Brief Hospital Course:   Joel Hernandez was admitted to [facility] for elective Aortic root replacement, aortic pseudoaneurysm repair, ascending aortic replacement via circulatory arrest on 07/24/18.  He was monitored in the intensive care unit following surgery. He then developed complete heart block for which the Electrophysiology service was consulted for possible pacemaker implantation and was completed on 07/23/18.  He was transferred to the floor on POD 3 but had a vasovagal episode while using the restroom.  CPR was initiated, ROSC was achieved and he was transferred back to the ICU.  He became increasingly unstable so he was taken back to OR on 07/24/18 for chest exploration. He was found to have tamponade and hemothorax was drained intraoperatively.  He received multiple blood products due to coagulopathy.  He was placed on dobutamine for vasogenic shock.  This was weaned off by POD 7.  Infectious Disease was consulted as the pathology from his aortic valve leaflets was concerning for endocarditis.  He was placed on empiric antibiotic therapy including unasyn, vancomycin, and doxycycline that will need to be continued for at least 6 weeks.  Blood cultures from 07/26/18 were negative. He was transferred to the cardiothoracic progressive care unit on post operative day #6. He was diuresed aggressively.  He slowly increased his daily activity and oral intake.  Rehab Medicine was consulted for discharge planning, however, he ultimately was stable to be discharged home.  He had to have his foley catheter replaced due to urinary retention. Urology was consulted and recommending continuing flomax and repeating of voiding trial. His foley was removed on  08/04/18 and he was able to void with only a 47 ml post-void residual.  A PICC line was placed by IR on 08/02/18.  He was stable to be discharged home with [facility] and IV antibiotics on post operative day# 14. ID will arrange for follow-up labs regarding his antibiotic therapy.     Condition at Discharge: Stable     Discharge Diagnoses:    Hospital Problems        Active Problems    * (Principal) S/P AVR (aortic valve replacement) and aortoplasty    Hypertension    Obstructive sleep apnea    Coronary artery disease due to calcified coronary lesion    Stenosis of lateral recess of lumbar spine    HLD (hyperlipidemia)    Acute on chronic diastolic CHF (congestive heart failure), NYHA class 2 (HCC)    Hypokalemia    Hypomagnesemia    S/p bare metal coronary artery stent    Presence of permanent cardiac pacemaker    Unplanned return to operating room with repeat sternotomy for cardiac tamponade       Resolved Problems RESOLVED: Aortic stenosis    RESOLVED: Thrombocytopenia (HCC)    RESOLVED: Intramural hematoma of thoracic aorta (HCC)    RESOLVED: Pseudoaneurysm of aorta (HCC)    RESOLVED: Complete heart block (HCC)    RESOLVED: Junctional rhythm    RESOLVED: Respiratory acidosis    RESOLVED: Vasogenic shock (HCC)    RESOLVED: Lactic acidosis        Surgical Procedures:   07/21/18 - Aortic root replacement with 25 mm Medtronic Freestyle. Repair of pseudoaneurysm of aortic annulus.  Replacement of the ascending aorta using 28 mm Gelweave graft, and the use of circulatory arrest.      07/23/18: Dual-chamber pacemaker implantation.    07/24/18: Mediastinal exploration with evacuation of hematoma.    7/6: PICC placement    Significant Diagnostic Studies and Procedures: noted in brief hospital course    Consults:  Anesthesiology, Cardiology - EP and ID     Patient Disposition: Home with Home Health Care       Patient instructions/medications:      CHEST 2 VIEWS   Standing Status: Future Standing Exp. Date: 08/05/19         Reason for exam:(Sign,Symptom,Reason) s/p AVR      Cardiac Diet    Limiting unhealthy fats and cholesterol is the most important step you can take in reducing your risk for cardiovascular disease. Unhealthy fats include saturated and trans fats. Monitor your sodium and cholesterol intake. Restrict your sodium to 2g (grams) or 2000mg  (milligrams) daily, and your cholesterol to 200mg  daily.    If you have questions regarding your diet at home, you may contact a dietitian at [phone].     Cardiac Diet    Limiting unhealthy fats and cholesterol is the most important step you can take in reducing your risk for cardiovascular disease.  Unhealthy fats include saturated and trans fats.  Monitor your sodium and cholesterol intake.  Restrict your sodium to 2g (grams) or 2000mg  (milligrams) daily, and your cholesterol to 200mg  daily.    If you have questions regarding your diet at home, you may contact a dietitian at [phone].       Testing Not Required for Covid-19     Other Activity Restrictions    -You should and need to walk daily. Your goal is to walk 30 minutes at at time without stopping for breaks. This is a daily exercise routine  that you should start as soon as you arrive home. Your basic daily activities do not count toward your 30 minute minimum, e.g. housework, toileting, fixing meals. Do not exercise outside in extremely hot or cold temperatures.  -Bathing: NO tub baths, hot tubs, or swimming for 6 weeks. You may shower at any time.  -Driving: NO driving for 2 weeks or while taking narcotics  -Lifting: NO lifting more than 10 pounds (gallon of milk) for 6 weeks.  -Monitoring: If you have access to a home blood pressure cuff, record your blood pressure and heart rate daily.   Please call if your systolic blood pressure is <90 or >160, OR  if your heart rate is <50 or >120 at rest     Incision Care    Keep your incision(s) clean and dry.  It is ok to shower unless otherwise noted.  Do not soak in a bath tub, hot tub, pool or lake for 6 weeks.  Do not use creams or lotions on incision until it is fully healed.  If your surgeon used Dermabond (skin glue), this will fall off gradually, do not pick at the glue.  If you notice redness, swelling, drainage, foul odor, or sutures sticking up through your incision, please call the Cardiothoracic Surgery clinic immediately.  [phone]     Report These Signs and Symptoms    Please contact your doctor for the following symptoms:  *Temperature over 100 degrees F  *Uncontrolled pain  *Drainage with a foul odor  *Shortness of breath  *Racing or skipping heart beats  *Popping or clicking of your breastbone  *Suture material sticking up through your incisions  *Change in coordination of ability to talk  *If you gain more than 2 pounds in 24 hours, or 5 pounds in one week.     Return Appointment Call to schedule an appointment with your primary care doctor in 1-2 weeks for a check up and medication review.     [place] Provider Lavone Orn [[id number]]      Return Appointment    Cardiology will contact you regarding your follow up appointment.  If you are not contacted in 5-7 business days after discharge please call [phone].     [place] Provider Vanice Sarah [phone]    Location [facility]      Cardiac Rehab    Your physician has referred you to participated in outpatient cardiac rehabilitation. Contact [facility] Cardiac Rehabilitation Department at [phone] to schedule an appointment.    If you will be attending cardiac rehab at a different facility, a referral will be sent to the facility of your choice.  If you have not heard from that facility within 2 weeks after you have been discharged, please call them to schedule an appointment.    Referral to outpatient cardiac rehab:    Outside referral faxed:    Internal referral sent to:       Opioid (Narcotic) Safety Information    OPIOID (NARCOTIC) PAIN MEDICATION SAFETY    We care about your comfort, and believe you need opioid medications at this time to treat your pain.  An opioid is a strong pain medication.  It is only available by prescription for moderate to severe pain.  Usually these medications are used for only a short time to treat pain, but sometimes will be prescribed for longer.  Talk with your doctor or nurse about how long they expect you to need this medication.  When used the right way, opioids are safe and effective medications to treat your pain, even when used for a long time.  Yet, when used in the wrong way, opioids can be dangerous for you or others.  Opioids do not work for everyone.  Most patients do not get full relief of their pain from opioid medication; full relief of your pain may not be possible.     For your safety, we ask you to follow these instructions: *Only take your opioid medication as prescribed.  If your pain is not controlled with the prescribed dose, or the medication is not lasting long enough, call your doctor.  *Do not break or crush your opioid medication unless your doctor or pharmacist says you can.  With certain medications, this can be dangerous, and may cause death.  *Never share your medications with others, even if they appear to have a good reason.  Never take someone else's pain medication-this is dangerous, and illegal (a crime).  Overdoses and deaths have occurred.  *Keep your opioid medications safe, as you would with cash, in a lock box or similar container.  *Make sure your opioids are going to be secure, especially if you are around children or teens.  *Talk with your doctor or pharmacist before you take other medications.  *Avoid driving, operating machinery, or drinking alcohol while taking opioid pain medication.  This may be unsafe.    Pain medications can cause constipation. Constipation is bowel movements that are less often than normal. Stools often become very hard and difficult to pass. This may lead to stomach pain and bloating. It may also cause pain when trying to use the bathroom. Constipation may be treated with suppositories, laxatives or stool softeners. A diet high in fiber with plenty of fluids helps to maintain regular, soft bowel movements.     Risk Reduction Plan    Cardiac Event Personal Risk Factor Reduction Plan  **Take this sheet to your physician to show treatment recommendations**  Joel Hernandez  Admission Date: 07/21/2018 LOS: @LOS @       Blood Pressure Risk  Goal: Keep blood pressure below 130/80  Your Numbers:  BP Readings from Last 1 Encounters:  07/23/18 : 130/56     Plan: High blood pressure is the single most important risk factor for cardiac events because it's the #1 cause of cardiac events.  Take medication as prescribed and monitor your blood pressure.    Abnormal lipids (fats in blood) Risk Goal: Total Cholesterol: <200  LDL (bad cholesterol): primary prevention <100   LDL (bad cholesterol): secondary prevention < 70  HDL (good cholesterol): >40 for men, >50 for women  Triglycerides: <150  Your Numbers: Cholesterol       Date                     Value               Ref Range           Status                02/16/2018               116 (L)             150 - 200           Final            ----------  LDL       Date  Value               Ref Range           Status                02/16/2018               63                                      Final            ----------  HDL       Date                     Value               Ref Range           Status                02/16/2018               39                                      Final            ----------  Triglycerides       Date                     Value               Ref Range           Status                02/16/2018               67                                      Final            ----------  Plan: Diets high in saturated fat, trans fat and cholesterol can raise blood cholesterol levels increasing your risk of having a cardiac event.  Take medication as prescribed and eat a heart healthy, low sodium diet.    Smoking Risk   Goals: If you smoke, STOP!   Plan: Smoking DOUBLES your risk of having a cardiac event. Quitting can greatly reduce your risk. To register for smoking cessation program call 9802026588 or visit www.smokefree.gov    Diabetes Risk   Goal: Non-diabetic: Below 5.7%  Goal for diabetic: Less than 7%  Your Numbers: Hemoglobin A1C       Date                     Value               Ref Range           Status                07/06/2018               5.2                 4.0 - 6.0 %  Final              Comment:    The ADA recommends that most patients with type 1 and type 2 diabetes maintain     an A1c level <7%.      ----------  Plan: If you have diabetes, even if treated, you are at an increased risk of having a cardiac event.     Alcohol Use Risk  Goal: Alcohol use can lead to a cardiac event.  Plan: For men, limit intake to no more than 2 drinks per day.  For women, limit intake to no more than one drink per day.    Weight Management Risk   Goal: Healthy: BMI is 18.5 to 24.9  Overweight: BMI is 25 to 29.9  Obese: BMI is 30 or higher  Morbid Obesity: BMI [...]  Your Numbers: Your BMI (Calculated): 26.38  Plan: If you have questions about your diet after you go home, you can call a dietitian at [phone]    Physical Activity Risk   Goal: Patients should have approval by a physician prior to beginning an exercise program.  Plan: Try to get at least 30 minutes of moderate physical activity five days a week or 20 minutes of vigorous physical activity three days a week with your doctor's approval.    To the Neurology and the NeuroSurg Discharge order sets set add a new order in the education section titled Risk Reduction Plan, in the comments section add the following (default select this new order for neuro and neuro surg and ensure this information is added to the AVS).     Additional Discharge Instructions    You MUST take antibiotics prior to any dental procedures (including cleaning), invasive respiratory tract procedures and invasive skin procedures.  You should NOT have any of these procedure for 3 months after your valve surgery.  This will help to prevent infection to the heart valve.  Contact your primary care provider or cardiologist for an antibiotic prescription when needed.     Questions About Your Stay    For questions or concerns regarding your hospital stay. Call [phone]     Discharging attending physician: Earlene Plater (TRIP) L III [[id number]]      Return Appointment    You will have a chest xray at 10:30, with an appointment to follow.    Where do I go for my x-ray?  Enter the main hospital entrance  Go past the information desk, past the escalators, to the six-pack of elevators.  (If you reach the cafeteria you have gone too far!)  Take the elevator to the 2nd floor.  Follow the sign for ???General Radiology??? ??? you will go down a short hallway.  Once your x-ray is complete, return to the main floor and check-in for your appointment at the Cardiovascular Medicine and Cardiovascular and Thoracic Surgery office located by the main entrance.     [place] Provider Jarome Lamas [phone]    Location [facility]    Appointment date: 08/16/2018      Procedure Specific Activity    *Do not raise your elbow higher than shoulder level on affected side.  *Continue to move your shoulder so the joint does not become stiff.  *You may resume driving 5 days after surgery.  *You may resume sexual activity in 1 week.  *If you toss and turn while sleeping, you should use your sling.  *No pushing, pulling, or lifting greater than 10-15 pounds with affected  arm for 1 month.  *Avoid placing all your weight on your arms for 4 weeks.  *Avoid bowling, swimming, golfing, or swinging a baseball bat for 90 days.  *Avoid any activity or exercise that involves rough contact with device site.  *Avoid any activity or exercise that might cause injury to skin over the device.     Incision Care    You may shower after 2-3 days, however try to keep your incision clean and DRY until dressing removed at follow up incision incision check appointment. Take sponge baths working around the incision during this time or if showering, cover with a towel or dressing with your incision facing away from the shower head.     You may shower once dressing removed at follow up appointment; however avoid direct contact with the incision (allow the water to hit the back of your shoulder rather than directly on the incision).    Do not submerge incision in tub, pool, hot tub, or lake for 4 weeks.    Unless your incision is bleeding or draining, keep it open to air. Avoid applying deodorants, powders, creams, lotions, etc. to your incision for 4 weeks.    Usually there are not stitches to be removed. Steri-strips will begin to fall off in 10-14 days. If they remain after 2 weeks, gently remove them when they are damp after a shower.    Your incision should gradually look better each day. Please notify our office immediately if you notice any of the following:  -an increase in swelling or redness  -any drainage  -increasing pain at the incision site  -fever over 100 degrees     Report These Signs and Symptoms    Please contact your doctor if you have any of the following symptoms: Incisional bleeding, oozing, or drainage. Any increased swelling, warmth or pain to incision site.     Risk Reduction Plan    Cardiac Event Personal Risk Factor Reduction Plan  **Take this sheet to your physician to show treatment recommendations**  Joel Hernandez  Admission Date: 07/21/2018 LOS: @LOS @       Blood Pressure Risk  Goal: Keep blood pressure below 130/80  Your Numbers:  BP Readings from Last 1 Encounters:  07/23/18 : 126/53     Plan: High blood pressure is the single most important risk factor for cardiac events because it's the #1 cause of cardiac events.  Take medication as prescribed and monitor your blood pressure.    Abnormal lipids (fats in blood) Risk   Goal: Total Cholesterol: <200  LDL (bad cholesterol): primary prevention <100   LDL (bad cholesterol): secondary prevention < 70  HDL (good cholesterol): >40 for men, >50 for women  Triglycerides: <150  Your Numbers: Cholesterol       Date                     Value               Ref Range           Status                02/16/2018               116 (L)             150 - 200           Final            ----------  LDL  Date                     Value               Ref Range           Status                02/16/2018               63                                      Final            ----------  HDL Date                     Value               Ref Range           Status                02/16/2018               39                                      Final            ----------  Triglycerides       Date                     Value               Ref Range           Status                02/16/2018               67                                      Final            ----------  Plan: Diets high in saturated fat, trans fat and cholesterol can raise blood cholesterol levels increasing your risk of having a cardiac event.  Take medication as prescribed and eat a heart healthy, low sodium diet.    Smoking Risk   Goals: If you smoke, STOP!   Plan: Smoking DOUBLES your risk of having a cardiac event. Quitting can greatly reduce your risk. To register for smoking cessation program call [phone] or visit www.smokefree.gov    Diabetes Risk   Goal: Non-diabetic: Below 5.7%  Goal for diabetic: Less than 7%  Your Numbers: Hemoglobin A1C       Date                     Value               Ref Range           Status                07/06/2018               5.2  4.0 - 6.0 %         Final              Comment:    The ADA recommends that most patients with type 1 and type 2 diabetes maintain     an A1c level <7%.      ----------  Plan: If you have diabetes, even if treated, you are at an increased risk of having a cardiac event.     Alcohol Use Risk  Goal: Alcohol use can lead to a cardiac event.  Plan: For men, limit intake to no more than 2 drinks per day.  For women, limit intake to no more than one drink per day.    Weight Management Risk   Goal: Healthy: BMI is 18.5 to 24.9  Overweight: BMI is 25 to 29.9  Obese: BMI is 30 or higher  Morbid Obesity: BMI [...]  Your Numbers: Your BMI (Calculated): 26.38  Plan: If you have questions about your diet after you go home, you can call a dietitian at [phone]    Physical Activity Risk   Goal: Patients should have approval by a physician prior to beginning an exercise program.  Plan: Try to get at least 30 minutes of moderate physical activity five days a week or 20 minutes of vigorous physical activity three days a week with your doctor's approval.    To the Neurology and the NeuroSurg Discharge order sets set add a new order in the education section titled Risk Reduction Plan, in the comments section add the following (default select this new order for neuro and neuro surg and ensure this information is added to the AVS).     Questions About Your Stay    For questions or concerns regarding your hospital stay:    - DURING BUSINESS HOURS (8:00 AM - 4:30 PM):    Call [phone] and asked to be transferred to your discharge attending physician.    - AFTER BUSINESS HOURS (4:30 PM - 8:00 AM, on weekends, or holidays):  Call [phone] and ask the operator to page the on-call doctor for the discharge attending physician.     Discharging attending physician: Earlene Plater (TRIP) L III [[id number]]      Return Appointment    This will be a Telehealth appointment     [place] Provider Jarome Lamas [[id number]]    Appointment date: 08/16/2018    Appointment time: 9:00 AM      Return Appointment    Return Patient with Vanice Sarah, MD  [facility] ([facility]am)   [address]  [address]  [place] [address]  [phone]      Provider Vanice Sarah [[id number]]    Appointment date: 09/02/2018    Appointment time: 2:00 PM      PATIENT GOING TO SKILLED NSG FACILITY     I certify that the patient requires skilled care Yes    The patient's stay is expected to be less than 30 days Yes    I will be in charge of patient in nursing home No      PPM   Standing Status: Future Standing Exp. Date: 07/23/19    4-12 weeks post device implant, to be done at primary cardiology f/u appt     Scheduling Priority: Routine      Appointment Request: Cardiac Rehab    Ordering of Outpatient Cardiac Rehabilitation includes: - Outpatient Cardiac Rehabilitation per department protocol, 2-3x  per week for up to a total of 36 sessions   - Initial evaluation including or baseline exercise testing  - Psychosocial, fitness, nutritional, medication, and risk factor assessment and education  - Development of individualized treatment plans (ITPs)  - If patient is diabetic, blood glucose testing as needed per departmental protocol    o Blood sugar monitoring and provision of 3 glucose tablets or ? 15 g carbohydrate snack for hypoglycemia based on   departmental protocol   - Initiation of all emergency protocols, as indicated per hospital and departmental policies, including, but not limited to    o Administration of nitroglycerin .4mg  sublingual for persistent, unstable, new-onset, or stable angina that doses not resolve  after 5 minutes of rest.  May repeat up to a total of 3 doses per protocol.    o Obtaining a 12-Lead EKG if indicated.    o Initiation and/or titration of O2 per departmental protocol for oxygen saturation <88% via pulse oximeter.   - Upon completion of the Cardiac Rehab program criteria, patient may continue into a maintenance program       Diagnosis Information: Valve Replacement    Pager # of the requesting provider if any questions? [phone]      Request for Cardiology Appointment   Standing Status: Future Standing Exp. Date: 07/23/23    1 month - approximate     Scheduling Priority: Routine    Schedule OV with (1st choice Provider) Danella Maiers M.D.    Special Visit Type Post Hospital Follow-up s/p AVR 07/21/18     Request for Cardiology Appointment   Standing Status: Future Standing Exp. Date: 07/24/23    1 month EP APP     Scheduling Priority: Routine    NP/Nurse Provider/Diet Seth Bake ARNP       Current Discharge Medication List       START taking these medications    Details   ampicillin/sulbactam (UNASYN) 3 g/8 mL in sodium chloride 0.9% (NS) 0.9 % 100 mL IVPB (MB+) Infuse 12 grams as a continuous infusion over 24 hours (12 grams per 24 hours)  Refills: 0    PRESCRIPTION TYPE:  No Print      aspirin 81 mg chewable tablet Chew one tablet by mouth daily. Take with food.  Qty: 90 tablet    PRESCRIPTION TYPE:  OTC      doxycycline (VIBRAMYCIN) 100 mg tablet Take one tablet by mouth twice daily.  Qty: 90 tablet, Refills: 1    PRESCRIPTION TYPE:  Normal      furosemide (LASIX) 40 mg tablet Take 1 tab po BID x 1 week, then 1 tab po daily  Qty: 90 tablet, Refills: 1    PRESCRIPTION TYPE:  Normal      oxyCODONE/acetaminophen (PERCOCET) 5/325 mg tablet Take one tablet to two tablets by mouth every 6 hours as needed for Pain  Qty: 30 tablet, Refills: 0    PRESCRIPTION TYPE:  Normal      potassium chloride SR (K-DUR) 10 mEq tablet Take 1 tab po bid x 7 days, then decrease to 1 tab po daily  Qty: 90 tablet, Refills: 1    PRESCRIPTION TYPE:  Normal      senna/docusate (SENOKOT-S) 8.6/50 mg tablet Take two tablets by mouth twice daily.    PRESCRIPTION TYPE:  No Print      vancomycin (VANCOCIN) 1,500 mg in sodium chloride 0.9% (NS) 0.9 % 280 mL IVPB Administer one thousand five hundred mg through vein every  24 hours.    PRESCRIPTION TYPE:  No Print          CONTINUE these medications which have been CHANGED or REFILLED    Details   losartan (COZAAR) 100 mg tablet Take one-half tablet by mouth daily.  Qty: 90 tablet, Refills: 3    PRESCRIPTION TYPE:  Normal      metoprolol tartrate (LOPRESSOR) 50 mg tablet Take one tablet by mouth twice daily.  Qty: 360 tablet, Refills: 3    PRESCRIPTION TYPE:  Normal          CONTINUE these medications which have NOT CHANGED    Details   atorvastatin (LIPITOR) 40 mg tablet Take one tablet by mouth daily.  Qty: 90 tablet, Refills: 3    PRESCRIPTION TYPE:  Normal      clopiDOGrel (PLAVIX) 75 mg tablet Take one tablet by mouth daily.  Qty: 90 tablet, Refills: 3    PRESCRIPTION TYPE:  Normal methIMAzole (TAPAZOLE) 10 mg tablet Take  by mouth twice daily. Take 1 tablet in the morning and 1.5 tablets in the evening    PRESCRIPTION TYPE:  Historical Med      tamsulosin (FLOMAX) 0.4 mg capsule Take 1 capsule by mouth at bedtime daily.    PRESCRIPTION TYPE:  Historical Med          The following medications were removed from your list. This list includes medications discontinued this stay and those removed from your prior med list in our system        enoxaparin (LOVENOX) 100 mg syringe        nitroglycerin (NITROSTAT) 0.4 mg tablet               Scheduled appointments:    Aug 16, 2018 11:30 AM CDT  Post - Op with Jarome Lamas, APRN-NP  [facility] ([facility]) [address]  [address]  [place] [address]  [phone]   Sep 02, 2018  2:00 PM CDT  Return Patient with Vanice Sarah, MD  [facility] ([facility]) [address]  [address]  [place] [address]  [phone]          Pending items needing follow up: none    Signed:  Shanda Bumps, PA-C    cc:  Primary Care Physician:  Lavone Orn     Referring physicians:  Arbutus Leas, PA-C   Additional provider(s): Danella Maiers, MD       Referral to outpatient cardiac rehab:    Outside referral faxed:    Internal referral sent to:

## 2018-07-23 NOTE — Progress Notes
Care of patient assumed.   Pt alert and oriented x4, PERRL, HR 70s, SBP 120s -140s, adequate UOP, CT output 60ml /2hr, pulses present with doppler, Lung sounds clear, RA  5/10 pain, PRN pain medication administered. Incisional wound CDI.

## 2018-07-23 NOTE — Consults
Electrophysiology Consult Note:    Admission Date: 07/21/2018  Date of Consultation:  07/22/2018  LOS: 1 day  Requesting Physician: Greggory Stallion (Trip) Caren Griffins, MD  Consulting Physician:  Dr. 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  Attempt at DC cardioversion unsuccessful.  DC'ed on amiodarone & Eliquis.Successful outpatient cardioversion at [facility]  Amiodarone continued.   - amiodarone discontinued within last few weeks per patient  - not on OAC since PCI in May   - CHADSVASC = 4    Hyperthyroidism   - on methimazole  - last TSH 0.32 in January       Recommendations:    Patient with existing conduction delays prior to surgical aortic valve replacement on 6/24. Now with intermittent CHB.     Recommend monitoring for an additional night with TPM in place. If patient requires no more pacing, then we may consider deferring PPM. However, if patient continues to require pacing, will proceed with dual chamber PPM tomorrow.    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 CHADSVASC = 4 (age+2, CAD, HTN). Recommend resuming OAC for stroke prevention when able. Would favor antiarrythmic other than amiodarone due to age and thyroid dysfunction. Will explore further tomorrow. The procedure has been explained to Joel Hernandez and his son at length in addition to risks and benefits of the procedure. All questions have been answered. He verbalized good understanding and wishes to proceed at this time.    NPO after midnight. Orders placed and consent signed and present in chart.     Seth Bake, APRN-C (personal pgr [phone])  Heart Rhythm Management (team pgr [phone])    Medicare attestation  ________________________________________________________________  History of Present Illness:  Joel Hernandez is a 78 y.o. male patient with PMH of aortic stenosis s/p SAVR with aortic root replacement, CAD s/p PCI in May, hyperthyroidism, and paroxysmal atrial fibrillation on PTA eliquis and amiodarone. EP has been consulted for consideration of permanent pacemaker for intermittent complete heart block.     Past Medical History:  Medical History:   Diagnosis Date   ??? Aortic stenosis    ??? Atrial fibrillation (HCC)    ??? Back pain    ??? CAD (coronary artery disease)    ??? Disorder of thyroid gland    ??? Heart murmur 09/15/2013   ??? Hyperlipidemia    ??? Hypertension 09/15/2013   ??? NSTEMI (non-ST elevated myocardial infarction) (HCC) 12/2015    s/p stent placement   ??? OSA on CPAP     No CPAP use after 55lb weight loss   ??? 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 SCC (squamous cell carcinoma), arm    ???  Skin cancer    ??? Sleep apnea          Social History:  Social History     Socioeconomic History   ??? Marital status: Married     Spouse name: Not on file   ??? Number of children: Not on file   ??? Years of education: Not on file   ??? Highest education level: Not on file   Occupational History   ??? Not on file   Tobacco Use   ??? Smoking status: Former Smoker     Packs/day: 0.50     Years: 15.00     Pack years: 7.50     Types: Cigarettes     Last attempt to quit: 11/20/1969     Years since quitting: 48.7   ??? Smokeless tobacco: Never Used Substance and Sexual Activity   ??? Alcohol use: No   ??? Drug use: Never   ??? Sexual activity: Not on file   Other Topics Concern   ??? Not on file   Social History Narrative   ??? Not on file       Surgical History:  Surgical History:   Procedure Laterality Date   ??? LUMBAR LAMINECTOMY  2004    L4-5   ??? LUMBAR LAMINECTOMY  2011    Hemilaminectomies L 2-3 L3-4   ??? RIGHT LUMBAR 4-5 REDO LAMINECTOMY AND LATERAL RECESS DECOMPRESSION Right 12/04/2016    Performed by Theotis Barrio, MD at [facility] OR   ??? LAMINECTOMY/ FACETECTOMY/ FORAMINOTOMY WITH DECOMPRESSION - 1 VERTEBRAL SEGMENT - LUMBAR  12/04/2016    Performed by Theotis Barrio, MD at [facility] OR   ??? CORONARY STENT PLACEMENT  12/2016   ??? KNEE REPLACEMENT Right 08/2017   ??? ANGIOGRAPHY CORONARY ARTERY WITH LEFT HEART CATHETERIZATION N/A 05/24/2018    Performed by Marcell Barlow[facility] MD at [facility] CATH LAB   ??? POSSIBLE PERCUTANEOUS CORONARY STENT PLACEMENT WITH ANGIOPLASTY N/A 05/24/2018    Performed by Marcell Barlow, MD at [facility] CATH LAB   ??? CLOSURE PSEUDOANEURYSM BY INJECTION- LOWER EXTREMITY N/A 05/26/2018    Performed by Marcell Barlow, MD at [facility] CATH LAB   ??? CORONARY STENT PLACEMENT  05/2018    x2   ??? COLONOSCOPY     ??? HEART CATHETERIZATION  01/2016, 05/2018    Stent x 1 LAD placement following NSTEMI, 2 stents placed 04/2018   ??? HX HEART CATHETERIZATION     ??? SKIN CANCER EXCISION  2017, 2018    [facility] Arm        Family History:  Family History   Problem Relation Age of Onset   ??? Heart Disease Mother        Medications:  acetaminophen (TYLENOL) tablet 1,000 mg, 1,000 mg, Oral, Q6H while awake  aspirin chewable tablet 81 mg, 81 mg, Oral, QDAY  atorvastatin (LIPITOR) tablet 40 mg, 40 mg, Oral, QHS  clopiDOGrel (PLAVIX) tablet 75 mg, 75 mg, Oral, QDAY  insulin aspart U-100 (NOVOLOG FLEXPEN) injection PEN 0-12 Units, 0-12 Units, Subcutaneous, ACHS (22)  lidocaine (LIDODERM) 5 % topical patch 1-2 patch, 1-2 patch, Topical, QDAY    And  Verification of Patch Placement and Integrity - Lidocaine 5%, , Transdermal, BID  methIMAzole (TAPAZOLE) tablet 10 mg, 10 mg, Oral, QDAY    And  methIMAzole (TAPAZOLE) tablet 15 mg, 15 mg, Oral, QDAY  senna/docusate (SENOKOT-S) tablet 2 tablet, 2 tablet, Oral, BID  tamsulosin (FLOMAX) capsule 0.4 mg, 0.4 mg, Oral, QHS      [START  ON 07/26/2018] acetaminophen Q6H PRN **OR** [START ON 07/26/2018] acetaminophen Q6H PRN, alum/mag hydroxide/simeth Q4H PRN, bisacodyL QDAY PRN, hydrALAZINE Q6H PRN, milk of magnesia (CONC) QDAY PRN, ondansetron Q6H PRN **OR** ondansetron (ZOFRAN) IV Q6H PRN, oxyCODONE Q4H PRN, potassium chloride SR PRN **OR** potassium chloride PRN **OR** potassium chloride in water PRN    Allergies:  Allergies   Allergen Reactions   ??? Lisinopril SEE COMMENTS     Reports respiratory distress and back pain       Review of Systems:  Const: denies recent fever, chills, or weight loss  Cardiovascular: denies chest pain or palpitations  Respiratory: denies dyspnea, orthopnea, PND or productive cough   Gastrointestinal:  denies N/V/D  Genitourinary: denies dysuria or hematuria  Musculoskeletal: midsternal incisional pain well controlled   Skin: denies known rashes, lesions or sores 0other than from surgical sites  Neurologic: denies dizziness, lightheadedness, syncope, or near-syncope  Heme: denies recent melena or hematochezia                           Vital Signs: Most Recent                 Vital Signs: 24 Hour Range   BP: 145/69 (06/25 1900)  Temp: 36.8 ???C (98.2 ???F) (06/25 1710)  Pulse: 71 (06/25 1900)  Respirations: 19 PER MINUTE (06/25 1900)  SpO2: 97 % (06/25 1900)  SpO2 Pulse: 69 (06/25 1900) BP: (94-152)/(46-87)   ABP: (104-200)/(29-59)   Temp:  [36.6 ???C (97.9 ???F)-38.2 ???C (100.8 ???F)]   Pulse:  [35-76]   Respirations:  [13 PER MINUTE-26 PER MINUTE]   SpO2:  [93 %-100 %]      Vitals:    07/21/18 0626   Weight: 90.7 kg (199 lb 15.3 oz)       Intake/Output Summary (Last 24 hours) at 07/22/2018 1954  Last data filed at 07/22/2018 1900  Gross per 24 hour   Intake 2578.73 ml Output 1330 ml   Net 1248.73 ml           Physical Exam:  GEN: well appearing 78 y.o. male who appears stated age and is no acute distress  HEENT: Unremarkable, musocsa moist  LUNGS: CTA bil., Chest tubes in place  HEART: RRR without murmur or gallop appreciated  EXTR: no C/C/E   SKIN: warm and dry, midsternal incision bandage CDI  NEUR: A&Ox3  PSYCH: calm and cooperative    Labs:  Hematology:    Lab Results   Component Value Date    HGB 10.6 07/22/2018    HCT 30.6 07/22/2018    PLTCT 72 07/22/2018    WBC 14.9 07/22/2018    NEUT 79 06/09/2018    ANC 6.10 06/09/2018    ALC 0.94 06/09/2018    MONA 6 06/09/2018    AMC 0.44 06/09/2018    ABC 0.07 06/09/2018    MCV 95.4 07/22/2018    MCHC 34.5 07/22/2018    MPV 10.8 07/22/2018    RDW 15.3 07/22/2018   , Coagulation:    Lab Results   Component Value Date    PTT 31.2 07/21/2018    INR 1.2 07/21/2018    and General Chemistry:    Lab Results   Component Value Date    NA 141 07/22/2018    K 4.2 07/22/2018    CL 110 07/22/2018    GAP 10 07/22/2018    BUN 18 07/22/2018    CR 1.14 07/22/2018  GLU 149 07/22/2018    CA 8.2 07/22/2018    ALBUMIN 3.8 07/06/2018    LACTIC 0.5 06/09/2018    MG 3.0 07/22/2018    TOTBILI 0.6 07/06/2018       ECG:  Immediate post procedure ECG shows normal sinus rhythm with right bundle branch block and left anterior fascicular block as well as global ST elevation    ECG obtained today shows persistent right bundle branch block as well as LAFB, however ST elevation has resolved       Telemetry:  SR with rates in the 70s and intermittent CHB requiring pacing      Seth Bake, APRN-C [phone])

## 2018-07-23 NOTE — Consults
Patient seen in EP consult by Horton Finer, APRN - see consult note.

## 2018-07-24 ENCOUNTER — Encounter: Admit: 2018-07-24 | Discharge: 2018-07-24

## 2018-07-24 DIAGNOSIS — I350 Nonrheumatic aortic (valve) stenosis: Principal | ICD-10-CM

## 2018-07-24 LAB — BASIC METABOLIC PANEL
Lab: 106 MMOL/L — ABNORMAL LOW (ref 98–110)
Lab: 137 MMOL/L — ABNORMAL LOW (ref 137–147)
Lab: 23 MMOL/L (ref 21–30)
Lab: 26 mg/dL — ABNORMAL HIGH (ref 7–25)
Lab: 8 pg (ref 3–12)
Lab: 8.2 mg/dL — ABNORMAL LOW (ref 8.5–10.6)
Lab: >60 mL/min (ref >60)
Lab: >60 mL/min (ref >60)

## 2018-07-24 LAB — MAGNESIUM: Lab: 2 mg/dL — ABNORMAL LOW (ref 1.6–2.6)

## 2018-07-24 LAB — CBC: Lab: 12 10*3/uL — ABNORMAL HIGH (ref 4.5–11.0)

## 2018-07-24 LAB — POC GLUCOSE
Lab: 111 mg/dL — ABNORMAL HIGH (ref 70–100)
Lab: 98 mg/dL (ref 70–100)

## 2018-07-24 MED ORDER — ELECTROLYTE-A IV SOLP
0 refills | Status: DC
Start: 2018-07-24 — End: 2018-07-25
  Administered 2018-07-25: 04:00:00 via INTRAVENOUS

## 2018-07-24 MED ORDER — EPINEPHRINE 0.1MG NS 10ML SYR (AM)(OR)
0 refills | Status: DC
Start: 2018-07-24 — End: 2018-07-25
  Administered 2018-07-25 (×2): 50 ug via INTRAVENOUS

## 2018-07-24 MED ORDER — VASOPRESSIN 20 UNITS IN 100ML IV INFUSION
.6-3.6 [IU]/h | INTRAVENOUS | 0 refills | Status: DC
Start: 2018-07-24 — End: 2018-07-27
  Administered 2018-07-25 (×4): 2.4 [IU]/h via INTRAVENOUS

## 2018-07-24 MED ORDER — SODIUM BICARBONATE 8.4 % (1 MEQ/ML) IV SYRG
0 refills | Status: DC
Start: 2018-07-24 — End: 2018-07-25
  Administered 2018-07-25: 03:00:00 100 meq via INTRAVENOUS

## 2018-07-24 MED ORDER — LACTATED RINGERS IV SOLP
500 mL | INTRAVENOUS | 0 refills | Status: DC
Start: 2018-07-24 — End: 2018-07-25
  Administered 2018-07-25: 02:00:00 500 mL via INTRAVENOUS

## 2018-07-24 MED ORDER — SODIUM BICARBONATE 8.4 % (1 MEQ/ML) IV SYRG
0 refills | Status: DC
Start: 2018-07-24 — End: 2018-07-25
  Administered 2018-07-25 (×3): 50 meq via INTRAVENOUS

## 2018-07-24 MED ORDER — EPINEPHRINE 16MCG/ML IN D5W IV DRIP (STD CONC)
.01 ug/kg/min | INTRAVENOUS | 0 refills | Status: DC
Start: 2018-07-24 — End: 2018-07-26
  Administered 2018-07-25: 10:00:00 0.03 ug/kg/min via INTRAVENOUS
  Administered 2018-07-25: 23:00:00 0.06 ug/kg/min via INTRAVENOUS
  Administered 2018-07-25 (×2): 0.2 ug/kg/min via INTRAVENOUS
  Administered 2018-07-25: 23:00:00 0.06 ug/kg/min via INTRAVENOUS
  Administered 2018-07-25 – 2018-07-26 (×3): 0.03 ug/kg/min via INTRAVENOUS

## 2018-07-24 MED ORDER — CEFAZOLIN 1 GRAM IJ SOLR
0 refills | Status: DC
Start: 2018-07-24 — End: 2018-07-25
  Administered 2018-07-25: 04:00:00 2 g via INTRAVENOUS

## 2018-07-24 MED ORDER — CALCIUM CHLORIDE 100 MG/ML (10 %) IV SYRG
0 refills | Status: DC
Start: 2018-07-24 — End: 2018-07-25
  Administered 2018-07-25: 04:00:00 1 g via INTRAVENOUS

## 2018-07-24 MED ORDER — PERFLUTREN LIPID MICROSPHERES 1.1 MG/ML IV SUSP
1-20 mL | Freq: Once | INTRAVENOUS | 0 refills | Status: CP | PRN
Start: 2018-07-24 — End: ?
  Administered 2018-07-25: 03:00:00 2 mL via INTRAVENOUS

## 2018-07-24 MED ORDER — VASOPRESSIN 20 UNITS IN 100ML IV INFUSION
2.4 [IU]/h | INTRAVENOUS | 0 refills | Status: DC
Start: 2018-07-24 — End: 2018-07-25

## 2018-07-24 MED ORDER — NOREPINEPHRINE BITARTRATE-NACL 4 MG/250 ML (16 MCG/ML) IV SOLN
0-.5 ug/kg/min | INTRAVENOUS | 0 refills | Status: DC
Start: 2018-07-24 — End: 2018-07-27
  Administered 2018-07-25: 03:00:00 .3 ug/kg/min via INTRAVENOUS
  Administered 2018-07-25: 03:00:00 0.2 ug/kg/min via INTRAVENOUS
  Administered 2018-07-26: 16:00:00 0.02 ug/kg/min via INTRAVENOUS

## 2018-07-24 MED ORDER — POLYETHYLENE GLYCOL 3350 17 GRAM PO PWPK
1 | Freq: Two times a day (BID) | ORAL | 0 refills | Status: DC
Start: 2018-07-24 — End: 2018-08-05
  Administered 2018-07-24 – 2018-07-27 (×7): 17 g via ORAL

## 2018-07-24 MED ORDER — EPINEPHRINE 0.1 MG/ML IJ SYRG
0 refills | Status: DC
Start: 2018-07-24 — End: 2018-07-27
  Administered 2018-07-25 (×4): 1 mg via INTRAVENOUS

## 2018-07-24 MED ORDER — POTASSIUM CHLORIDE IN WATER 10 MEQ/50 ML IV PGBK
10 meq | INTRAVENOUS | 0 refills | Status: DC
Start: 2018-07-24 — End: 2018-07-25

## 2018-07-24 MED ORDER — SODIUM CHLORIDE 0.9 % IV SOLP
0 refills | Status: DC
Start: 2018-07-24 — End: 2018-07-25
  Administered 2018-07-25: 03:00:00 1000 mL via INTRAVENOUS

## 2018-07-24 MED ORDER — INSULIN REGULAR IN 0.9 % NACL 100 UNIT/100 ML (INFUSION)(AM)(OR)
0 refills | Status: DC
Start: 2018-07-24 — End: 2018-07-25
  Administered 2018-07-25: 04:00:00 2 [IU]/h via INTRAVENOUS

## 2018-07-24 MED ORDER — METOPROLOL TARTRATE 25 MG PO TAB
12.5 mg | Freq: Two times a day (BID) | ORAL | 0 refills | Status: DC
Start: 2018-07-24 — End: 2018-07-25
  Administered 2018-07-24: 15:00:00 12.5 mg via ORAL

## 2018-07-24 MED ORDER — LACTATED RINGERS IV SOLP
500 mL | INTRAVENOUS | 0 refills | Status: CP
Start: 2018-07-24 — End: ?
  Administered 2018-07-25: 02:00:00 500 mL via INTRAVENOUS

## 2018-07-24 MED ORDER — VASOPRESSIN 20 UNIT/ML IV SOLN
0 refills | Status: DC
Start: 2018-07-24 — End: 2018-07-25
  Administered 2018-07-25 (×2): 10 [IU] via INTRAVENOUS

## 2018-07-24 MED ORDER — PNEUMOCOCCAL 23-VAL PS VACCINE 25 MCG/0.5 ML IJ SOLN
.5 mL | Freq: Once | INTRAMUSCULAR | 0 refills | Status: DC
Start: 2018-07-24 — End: 2018-08-05

## 2018-07-24 MED ORDER — CALCIUM CHLORIDE 100 MG/ML (10 %) IV SYRG
0 refills | Status: DC
Start: 2018-07-24 — End: 2018-07-25
  Administered 2018-07-25 (×4): 1 g via INTRAVENOUS

## 2018-07-24 NOTE — Progress Notes
55M with h/o AS, CAD s/p PCI 04/2018, HTN, HLD, sleep apnea (doesn't need cpap after weight loss), former smoker, hyperthyroid, BPH, pseudoaneurysm around the aortic annulus, s/p aortic root 76mm freestyle, Repair of pseudoaneurysm of aortic annulus and Replacement of the ascending aorta using 28 mm Gelweave graft, and the use of circulatory arrest on 07/21/2018, s/p PPI on 07/23/2018    On 2L NC Paced at 60, MAPS 70s-80s. I/0 -20. UOP 640. Lab pending. On asa, statin, plavix, flomax.     Labs and 2 view cxr pending. overall doing well. transfer to floor        Drue Novel, MD  [phone]

## 2018-07-24 NOTE — Progress Notes
RT Adult Assessment Note    NAME:Joel Hernandez             MRN: [id number]             DOB:22-Feb-1940          AGE: 78 y.o.  ADMISSION DATE: 07/21/2018             DAYS ADMITTED: LOS: 3 days    RT Treatment Plan:       Protocol Plan: Procedures  PEP Therapy: Discontinued  PAP: Place a nursing order for "IS Q1h While Awake" for any of Lung Expansion indicators  Oxygen/Humidity: Discontinued  SpO2: Discontinued    Additional Comments:  Impressions of the patient: Pt resting up in chair on room air, without apparent distress.  Intervention(s)/outcome(s): Pt assessed, pt performed PEP, PAP, and IS. He expresses interest in continuing to self direct with IS and PEP.  Patient education that was completed: n/a  Recommendations to the care team: n/a    Vital Signs:  Pulse: 60  RR: 13 PER MINUTE  SpO2: 99 %  Breath Sounds: Clear (Implies normal)  Respiratory Effort: Non-Labored

## 2018-07-24 NOTE — Progress Notes
Critical Care Progress Note          Today's Date:  07/23/2018  Name:  Joel Hernandez                       MRN:  [id number]   Admission Date: 07/21/2018  LOS: 2 days                     Assessment/Plan:   Principal Problem:    S/P AVR (aortic valve replacement) and aortoplasty  Active Problems:    Hypertension    Obstructive sleep apnea    Stenosis of lateral recess of lumbar spine    Thrombocytopenia (HCC)    HLD (hyperlipidemia)    Acute on chronic diastolic CHF (congestive heart failure), NYHA class 2 (HCC)    S/p bare metal coronary artery stent    Intramural hematoma of thoracic aorta (HCC)    Pseudoaneurysm of aorta (HCC)    Complete heart block (HCC)    Junctional rhythm    On mechanical ventilation    Vasogenic shock    Hypothyroidism    78 year old male significant past medical history for coronary disease status post PCI on 04/2018, aortic stenosis, hypertension, hyperlipidemia, OSA, hypothyroidism and BPH.  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  Patient underwent AVR and root replacement on 6/24.  He was admitted to CTICU for post operative management.    Neuro: PRN pain meds as needed.   Cardiac: Intraoperative TEE with hyperdynamic LV with thickened walls, RV with normal function, no hemodynamicaly significant valvulopathy.  Cardiac indices stable.  Weaned off vasoactive medications. Intermittent heart block. Will need pacer with EP.   Pulmonary: SORA. IS/Pulm Toilet.   FEN: NPO for procedure  ID:  Afebrile. Ancef for surgical prophylaxis. Monitor fever curve.  Renal: Monitor strict I/O and renal function. At risk for AKI. Trend BMP. Replete electrolytes PRN. Euvolemia.   Heme: Stable. No coumadin per CTS. Plavix.  Endo:  Modified Yale Insulin Protocol.   Insulin drip ordered.  Continue PTA methimazole.  Prophylaxis: HOB>40, H2 blocker, DVT prophylaxis per primary team  Disposition/Family:  Needs ICU immediately status post AVR with root replacement  __________________________________________________________________________________    Prophylaxis Review:  Lines:  Yes; Arterial Line; Indication:  Frequent blood draws and Continuous BP monitoring; Location:  Radial  Central Line; Indication:  Hemodynamic monitoring; Type:  Internal jugular  Urinary Catheter:  Yes; Retain foley due to:  Need for accurate Intake and Output  Antibiotic Usage: Surgical prophylaxis  VTE: SCDs    Lab Review:  Pertinent labs reviewed  Point of Care Testing:  (Last 24 hours):  Glucose: (!) 119 (07/23/18 0335)  POC Glucose (Download): (!) 103 (07/23/18 1722)    Radiology and Other Diagnostic Procedures Review:    Pertinent radiology reviewed.    Date of Service: 07/23/18    I saw this patient today on multidisciplinary rounds in conjunction with the CTICU team and bedside nurse. This patient is critically ill due to aortic valve replacement with postoperative vasogenic shock with dysfunction of at least one organ system.  I provided and personally directed critical care services including Ventilator Management, Invasive Hemodynamic Management, Management of Cardiac Support Infusions, Management of Vasogenic Shock, Comprehensive Pain Management and Complex Fluid & Electrolyte Management and the patient is at risk for life threatening deterioration necessitating complex medical decision making and ongoing provision of ICU care    Critical Care Time:  40 minutes  Gabriel Cirri, MD  Assistant Professor  Anesthesiology and Critical Care

## 2018-07-24 NOTE — Progress Notes
Patient arrived to room # [id number]) via bed accompanied by RN. Bedside safety checks completed. Initial patient assessment completed. Refer to flowsheet for details.    Admission skin assessment completed with: Arminda, RN    Pressure injury present on arrival?: No    1. Head/Face/Neck: No  2. Trunk/Back: No  3. Upper Extremities: No  4. Lower Extremities: No  5. Pelvic/Coccyx: No  6. Assessed for device associated injury? No  7. Malnutrition Screening Tool (Nursing Nutrition Assessment) Completed? No    See Doc Flowsheet for additional wound details.     INTERVENTIONS: Q 2 turns and encourage mobility.

## 2018-07-24 NOTE — Other
Cardiac Electrophysiology Brief Post-Procedure Note    Attending: Clydia Llano, MD          Preoperative diagnosis: complete heart block following aortic root replacmeent and aortic valve replacement      Postoperative diagnosis:  complete heart block following aortic root replacmeent and aortic valve replacement    Procedure(s) Performed: Dual-chamber pacemaker implantation     Procedure Description: Successful Dual-chamber pacemaker implantation     Procedure Findings: Adequate testing of device leads  -Please review full operative report for full details of the procedure    Anesthesia: Moderate sedation via RN     Estimated blood loss: 10 mL    Complications: None     Specimens removed: None     Sheaths: 7 [place] pe[place]way sheaths x 2     Closing: interrupted 2-0 Vicryl, continuous 2-0 Vicryl, continuous 4-0 Monocryl, then dressing     Recommendations: (device)  - Pain control as needed  - Device interrogation, ECG, and Chest X-Ray in the morning  - No post-procedure antibiotics   - No heparin or LMWH products for 72 hours post-procedure  - Incision Check in one week    - Follow-up: With Primary General Cardiologist

## 2018-07-24 NOTE — Progress Notes
Patient arrived to room # (404) via wheelchair accompanied by RN. Patient transferred to the chair with assistance. Bedside safety checks completed. Initial patient assessment completed. Refer to flowsheet for details.    Admission skin assessment completed with: Chelsea, RN    Pressure injury present on arrival?: No    1. Head/Face/Neck: No  2. Trunk/Back: No  3. Upper Extremities: No  4. Lower Extremities: No  5. Pelvic/Coccyx: No  6. Assessed for device associated injury? Yes  7. Malnutrition Screening Tool (Nursing Nutrition Assessment) Completed? Yes    See Doc Flowsheet for additional wound details.     INTERVENTIONS:   Mobility, frequent position changes, adequate nutrition, reduce friction.

## 2018-07-24 NOTE — Progress Notes
EP Note:  Awake and alert.  Denies complaints overnight.  Silverlon dressing remains intact.  Minimal pocket edema with edges of device easily palpated.  Dressing will remain in place until 1 week incision check.    CXR reveals appropriate lead placement without evidence of pneumothorax.    Device interrogation reveals normal pacing, sensing, and threshold testing.      Stable from EP standpoint.  Will sign off.  Post op instructions and follow up placed in O2.  Please call with questions or concerns.    Katie Fredda Clarida, NP-C  pgr [phone]

## 2018-07-25 DIAGNOSIS — I350 Nonrheumatic aortic (valve) stenosis: Principal | ICD-10-CM

## 2018-07-25 LAB — POC BLOOD GAS ARTERIAL
Lab: 17 MMOL/L — ABNORMAL LOW (ref 21–28)
Lab: 31 mmHg — ABNORMAL LOW (ref 35–45)
Lab: 7.3 (ref 7.35–7.45)
Lab: 14 MMOL/L — ABNORMAL LOW (ref 21–28)
Lab: 24 MMOL/L (ref 21–28)
Lab: 55 mmHg — ABNORMAL HIGH (ref 35–45)
Lab: 75 mmHg — ABNORMAL HIGH (ref 35–45)
Lab: 19 MMOL/L — ABNORMAL LOW (ref 21–28)
Lab: 21 MMOL/L (ref 21–28)
Lab: 47 mmHg — ABNORMAL HIGH (ref 35–45)
Lab: 52 mmHg — ABNORMAL HIGH (ref 35–45)
Lab: 7 — CL (ref 7.35–7.45)
Lab: 7.1 — CL (ref 7.35–7.45)
Lab: 7.1 — CL (ref 7.35–7.45)
Lab: 7.2 — ABNORMAL LOW (ref 7.35–7.45)
Lab: 21 MMOL/L (ref 21–28)
Lab: 61 mmHg — ABNORMAL HIGH (ref 35–45)
Lab: 7.1 — CL (ref 7.35–7.45)
Lab: 1 MMOL/L
Lab: 23 MMOL/L (ref 21–28)
Lab: 35 mmHg (ref 35–45)
Lab: 7.4 (ref 7.35–7.45)
Lab: 86 mmHg (ref 80–100)
Lab: 97 % (ref 95–99)

## 2018-07-25 LAB — POC IONIZED CALCIUM
Lab: 1.1 MMOL/L (ref 1.0–1.3)
Lab: 1.4 MMOL/L — ABNORMAL HIGH (ref 1.0–1.3)
Lab: 1.5 MMOL/L — ABNORMAL HIGH (ref 1.0–1.3)
Lab: 1.4 MMOL/L — ABNORMAL HIGH (ref 1.0–1.3)
Lab: 1.8 MMOL/L — ABNORMAL HIGH (ref 1.0–1.3)
Lab: 1.7 MMOL/L — ABNORMAL HIGH (ref 1.0–1.3)

## 2018-07-25 LAB — POC HEMATOCRIT
Lab: 10 g/dL — ABNORMAL LOW (ref 13.5–16.5)
Lab: 32 % — ABNORMAL LOW (ref 40–50)
Lab: 25 % — ABNORMAL LOW (ref 40–50)
Lab: 27 % — ABNORMAL LOW (ref 40–50)
Lab: 8.5 g/dL — ABNORMAL LOW (ref 13.5–16.5)
Lab: 9.2 g/dL — ABNORMAL LOW (ref 13.5–16.5)
Lab: 10 g/dL — ABNORMAL LOW (ref 13.5–16.5)
Lab: 10 g/dL — ABNORMAL LOW (ref 13.5–16.5)
Lab: 32 % — ABNORMAL LOW (ref 40–50)
Lab: 32 % — ABNORMAL LOW (ref 40–50)
Lab: 12 g/dL — ABNORMAL LOW (ref 13.5–16.5)
Lab: 38 % — ABNORMAL LOW (ref 40–50)

## 2018-07-25 LAB — BASIC METABOLIC PANEL
Lab: 1.1 mg/dL — ABNORMAL LOW (ref 0.4–1.24)
Lab: 10 mg/dL (ref 8.5–10.6)
Lab: 10 pg (ref 3–12)
Lab: 115 mg/dL — ABNORMAL HIGH (ref 70–100)
Lab: 140 MMOL/L — ABNORMAL LOW (ref 137–147)
Lab: 23 MMOL/L (ref 21–30)
Lab: 32 mg/dL — ABNORMAL HIGH (ref 7–25)
Lab: >60 mL/min (ref >60)
Lab: >60 mL/min (ref >60)
Lab: 1.3 mg/dL — ABNORMAL HIGH (ref 0.4–1.24)
Lab: 110 MMOL/L — ABNORMAL LOW (ref 98–110)
Lab: 136 mg/dL — ABNORMAL HIGH (ref 70–100)
Lab: 140 MMOL/L — ABNORMAL LOW (ref 137–147)
Lab: 21 MMOL/L (ref 21–30)
Lab: 37 mg/dL — ABNORMAL HIGH (ref 7–25)
Lab: 4.7 MMOL/L — ABNORMAL LOW (ref 3.5–5.1)
Lab: 53 mL/min — ABNORMAL LOW (ref >60)
Lab: 9 pg (ref 3–12)
Lab: 9.5 mg/dL (ref 8.5–10.6)
Lab: >60 mL/min (ref >60)

## 2018-07-25 LAB — COMPREHENSIVE METABOLIC PANEL
Lab: 0.6 mg/dL (ref 0.3–1.2)
Lab: 135 MMOL/L — ABNORMAL LOW (ref 137–147)
Lab: 139 U/L — ABNORMAL HIGH (ref 25–110)
Lab: 184 mg/dL — ABNORMAL HIGH (ref 70–100)
Lab: 21 MMOL/L (ref 21–30)
Lab: 3.3 g/dL — ABNORMAL LOW (ref 3.5–5.0)
Lab: 32 mg/dL — ABNORMAL HIGH (ref 7–25)
Lab: 46 U/L — ABNORMAL HIGH (ref 7–40)
Lab: 5.8 g/dL — ABNORMAL LOW (ref 6.0–8.0)
Lab: 8.4 mg/dL — ABNORMAL LOW (ref 8.5–10.6)
Lab: 0.7 mg/dL (ref 0.3–1.2)
Lab: 1.3 mg/dL — ABNORMAL HIGH (ref 0.4–1.24)
Lab: 11 mg/dL — ABNORMAL HIGH (ref 8.5–10.6)
Lab: 142 MMOL/L — ABNORMAL LOW (ref 137–147)
Lab: 2.4 g/dL — ABNORMAL LOW (ref 3.5–5.0)
Lab: 20 MMOL/L — ABNORMAL LOW (ref 21–30)
Lab: 26 U/L (ref 7–56)
Lab: 3.9 g/dL — ABNORMAL LOW (ref 6.0–8.0)
Lab: 46 U/L — ABNORMAL HIGH (ref 7–40)
Lab: 53 mL/min — ABNORMAL LOW (ref >60)
Lab: 96 U/L (ref 25–110)
Lab: >60 mL/min (ref >60)
Lab: 16 — ABNORMAL HIGH (ref 3–12)

## 2018-07-25 LAB — BLOOD GASES, ARTERIAL
Lab: 18 MMOL/L — ABNORMAL LOW (ref 21–28)
Lab: 52 mmHg — ABNORMAL HIGH (ref 35–45)
Lab: 6.8 MMOL/L
Lab: 76 mmHg — ABNORMAL LOW (ref 80–100)
Lab: 92 % — ABNORMAL LOW (ref 95–99)
Lab: 107 mmHg — ABNORMAL HIGH (ref 80–100)
Lab: 21 MMOL/L (ref 21–28)
Lab: 3.9 MMOL/L (ref 3.5–5.0)
Lab: 7.2 — ABNORMAL LOW (ref 7.35–7.45)
Lab: 7.3 mg/dL — ABNORMAL LOW (ref 7.35–7.45)
Lab: 97 % (ref 95–99)
Lab: 2.2 MMOL/L
Lab: 22 MMOL/L (ref 21–28)
Lab: 34 mmHg — ABNORMAL LOW (ref 35–45)
Lab: 7.4 (ref 7.35–7.45)
Lab: 97 % (ref 95–99)

## 2018-07-25 LAB — POC GLUCOSE
Lab: 213 mg/dL — ABNORMAL HIGH (ref 70–100)
Lab: 228 mg/dL — ABNORMAL HIGH (ref 70–100)
Lab: 190 mg/dL — ABNORMAL HIGH (ref 70–100)
Lab: 214 mg/dL — ABNORMAL HIGH (ref 70–100)
Lab: 170 mg/dL — ABNORMAL HIGH (ref 70–100)
Lab: 113 mg/dL — ABNORMAL HIGH (ref 70–100)
Lab: 102 mg/dL — ABNORMAL HIGH (ref 70–100)
Lab: 100 mg/dL — AB (ref 70–100)
Lab: 89 mg/dL — AB (ref 70–100)
Lab: 110 mg/dL — ABNORMAL HIGH (ref 70–100)
Lab: 125 mg/dL — ABNORMAL HIGH (ref 70–100)
Lab: 129 mg/dL — ABNORMAL HIGH (ref 70–100)
Lab: 133 mg/dL — ABNORMAL HIGH (ref 70–100)
Lab: 134 mg/dL — ABNORMAL HIGH (ref 70–100)
Lab: 227 mg/dL — ABNORMAL HIGH (ref 70–100)

## 2018-07-25 LAB — POC POTASSIUM
Lab: 4.1 MMOL/L — ABNORMAL HIGH (ref 3.5–5.1)
Lab: 3.1 MMOL/L — ABNORMAL LOW (ref 3.5–5.1)
Lab: 4.2 MMOL/L (ref 3.5–5.1)
Lab: 3 MMOL/L — ABNORMAL LOW (ref 3.5–5.1)
Lab: 3 MMOL/L — ABNORMAL LOW (ref 3.5–5.1)
Lab: 2.8 MMOL/L — ABNORMAL LOW (ref 3.5–5.1)

## 2018-07-25 LAB — CBC
Lab: 16 K/UL — ABNORMAL HIGH (ref 4.5–11.0)
Lab: 22 10*3/uL — ABNORMAL HIGH (ref 4.5–11.0)
Lab: 24 10*3/uL — ABNORMAL HIGH (ref 4.5–11.0)
Lab: 19 10*3/uL — ABNORMAL HIGH (ref 4.5–11.0)
Lab: 11 g/dL — ABNORMAL LOW (ref 13.5–16.5)
Lab: 117 10*3/uL — ABNORMAL LOW (ref 150–400)
Lab: 15 % — ABNORMAL HIGH (ref 11–15)
Lab: 22 K/UL — ABNORMAL HIGH (ref >60)
Lab: 3.5 M/UL — ABNORMAL LOW (ref >60)
Lab: 31 pg (ref 26–34)
Lab: 32 % — ABNORMAL LOW (ref 40–50)
Lab: 33 g/dL (ref 32.0–36.0)
Lab: 8.5 FL (ref 7–11)
Lab: 92 FL (ref 80–100)

## 2018-07-25 LAB — TROPONIN-I: Lab: 5.6 ng/mL — ABNORMAL HIGH (ref 0.0–0.05)

## 2018-07-25 LAB — POC SODIUM
Lab: 134 MMOL/L — ABNORMAL LOW (ref 137–147)
Lab: 136 MMOL/L — ABNORMAL LOW (ref 137–147)
Lab: 143 MMOL/L (ref 137–147)
Lab: 141 MMOL/L (ref 137–147)
Lab: 141 MMOL/L (ref 137–147)
Lab: 142 MMOL/L (ref 137–147)

## 2018-07-25 LAB — LACTIC ACID(LACTATE)
Lab: 9.5 MMOL/L — ABNORMAL HIGH (ref 0.5–2.0)
Lab: 2.9 MMOL/L — ABNORMAL HIGH (ref 0.5–2.0)
Lab: 2.6 MMOL/L — ABNORMAL HIGH (ref 0.5–2.0)

## 2018-07-25 LAB — O2 SATURATION, MIXED VENOUS: Lab: 52 % (ref 35–45)

## 2018-07-25 LAB — MAGNESIUM: Lab: 1.9 mg/dL — ABNORMAL LOW (ref 1.6–2.6)

## 2018-07-25 LAB — IONIZED CALCIUM: Lab: 1.6 MMOL/L — ABNORMAL HIGH (ref 1.0–1.3)

## 2018-07-25 LAB — PROTIME INR (PT)
Lab: 1.2 MMOL/L — ABNORMAL LOW (ref 0.8–1.2)
Lab: 1.4 mg/dL — ABNORMAL HIGH (ref 0.8–1.2)

## 2018-07-25 LAB — PTT (APTT)
Lab: 24 s — ABNORMAL LOW (ref 24.0–36.5)
Lab: 30 s — ABNORMAL LOW (ref 24.0–36.5)

## 2018-07-25 MED ORDER — ALBUMIN, HUMAN 5 % IV SOLP
250 mL | Freq: Once | INTRAVENOUS | 0 refills | Status: CP
Start: 2018-07-25 — End: ?
  Administered 2018-07-25: 09:00:00 250 mL via INTRAVENOUS

## 2018-07-25 MED ORDER — NICARDIPINE IN NACL (ISO-OS) 20 MG/200 ML IV PGBK
5-15 mg/h | INTRAVENOUS | 0 refills | Status: DC
Start: 2018-07-25 — End: 2018-07-27
  Administered 2018-07-25 (×3): 5 mg/h via INTRAVENOUS
  Administered 2018-07-26 (×2): 7.5 mg/h via INTRAVENOUS

## 2018-07-25 MED ORDER — INSULIN ASPART 100 UNIT/ML SC FLEXPEN
0-12 [IU] | Freq: Before meals | SUBCUTANEOUS | 0 refills | Status: DC
Start: 2018-07-25 — End: 2018-07-29
  Administered 2018-07-25: 22:00:00 4 [IU] via SUBCUTANEOUS

## 2018-07-25 MED ORDER — CEFAZOLIN INJ 1GM IVP
2 g | INTRAVENOUS | 0 refills | Status: CP
Start: 2018-07-25 — End: ?
  Administered 2018-07-25 – 2018-07-26 (×3): 2 g via INTRAVENOUS

## 2018-07-25 MED ORDER — ALBUMIN, HUMAN 5 % IV SOLP
250 mL | Freq: Once | INTRAVENOUS | 0 refills | Status: CP
Start: 2018-07-25 — End: ?
  Administered 2018-07-25: 11:00:00 250 mL via INTRAVENOUS

## 2018-07-25 MED ORDER — SODIUM CHLORIDE 0.9 % IV SOLP
0 refills | Status: DC
Start: 2018-07-25 — End: 2018-07-25
  Administered 2018-07-25: 03:00:00 3000 mL

## 2018-07-25 MED ORDER — FENTANYL CITRATE (PF) 50 MCG/ML IJ SOLN
25-50 ug | INTRAVENOUS | 0 refills | Status: DC | PRN
Start: 2018-07-25 — End: 2018-07-25
  Administered 2018-07-25 (×4): 50 ug via INTRAVENOUS

## 2018-07-25 MED ORDER — AUTOLOGOUS BLOOD (CELL SAVER)
0 refills | Status: DC
Start: 2018-07-25 — End: 2018-07-25

## 2018-07-25 MED ORDER — INSULIN REGULAR IN 0.9 % NACL 100 UNIT/100 ML (1 UNIT/ML) IV SOLN
1-32 [IU]/h | INTRAVENOUS | 0 refills | Status: DC
Start: 2018-07-25 — End: 2018-07-25

## 2018-07-25 MED ORDER — HYDROMORPHONE (PF) 2 MG/ML IJ SYRG
.5 mg | INTRAVENOUS | 0 refills | Status: DC | PRN
Start: 2018-07-25 — End: 2018-07-27
  Administered 2018-07-25: 18:00:00 0.5 mg via INTRAVENOUS

## 2018-07-25 MED ORDER — POTASSIUM CHLORIDE IN WATER 10 MEQ/50 ML IV PGBK
10 meq | INTRAVENOUS | 0 refills | Status: DC
Start: 2018-07-25 — End: 2018-07-25
  Administered 2018-07-25 (×2): 10 meq via INTRAVENOUS

## 2018-07-25 MED ORDER — DEXMEDETOMIDINE IN 0.9 % NACL 400 MCG/100 ML (4 MCG/ML) IV SOLN
.2-1 ug/kg/h | INTRAVENOUS | 0 refills | Status: DC
Start: 2018-07-25 — End: 2018-07-25
  Administered 2018-07-25: 07:00:00 0.2 ug/kg/h via INTRAVENOUS
  Administered 2018-07-25: 15:00:00 0.7 ug/kg/h via INTRAVENOUS

## 2018-07-25 MED ORDER — POTASSIUM CHLORIDE 20 MEQ/15 ML PO LIQD
40 meq | Freq: Once | OROGASTRIC | 0 refills | Status: CP
Start: 2018-07-25 — End: ?
  Administered 2018-07-25: 08:00:00 40 meq via OROGASTRIC

## 2018-07-25 MED ORDER — POTASSIUM CHLORIDE IN WATER 10 MEQ/50 ML IV PGBK
10 meq | INTRAVENOUS | 0 refills | Status: AC
Start: 2018-07-25 — End: ?
  Administered 2018-07-25 (×2): 10 meq via INTRAVENOUS

## 2018-07-25 MED ADMIN — MIDAZOLAM 1 MG/ML IJ SOLN [10607]: 1 mg | INTRAVENOUS | @ 03:00:00 | Stop: 2018-07-25 | NDC 00409230521

## 2018-07-25 NOTE — Progress Notes
CI 1.87 S. Hemmings APRN aware. New orders received. Will continue to monitor.

## 2018-07-25 NOTE — Progress Notes
CI 1.93 Dr. Justus Memory and Dr. Dagoberto Ligas at bedside and aware. Pt on Levo gtt at this time. VSS. Will continue to monitor.

## 2018-07-25 NOTE — Progress Notes
Critical Care Progress Note          Today's Date:  07/24/2018  Name:  Joel Hernandez                       MRN:  [id number]   Admission Date: 07/21/2018  LOS: 3 days                     Assessment/Plan:   Principal Problem:    S/P AVR (aortic valve replacement) and aortoplasty  Active Problems:    Hypertension    Obstructive sleep apnea    Stenosis of lateral recess of lumbar spine    Thrombocytopenia (HCC)    HLD (hyperlipidemia)    Acute on chronic diastolic CHF (congestive heart failure), NYHA class 2 (HCC)    S/p bare metal coronary artery stent    Intramural hematoma of thoracic aorta (HCC)    Pseudoaneurysm of aorta (HCC)    Complete heart block (HCC)    Junctional rhythm    On mechanical ventilation    Vasogenic shock    Hypothyroidism    78 year old male significant past medical history for coronary disease status post PCI on 04/2018, aortic stenosis, hypertension, hyperlipidemia, OSA, hypothyroidism and BPH.  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  Patient underwent AVR and root replacement on 6/24.  He was admitted to CTICU for post operative management.    Neuro: Pt interactive post code event, but quickly progressing to instability and required intubation. No sedation or paralytics required 2/2 instability.   Cardiac: Intermittent heart block with pacer requirement post procedure. Collapsed on floor with concern for coronary ischemia vs tamponade. TTE with consolidated looking effusion. PA cath and arterial line placed emergently. Evaluated device with good capture and near 100% V paced.   Pulmonary: Intubated and mechanically ventilated prior to transfer to OR. Concern for aspiration peri-arrest/peri-intubation.   FEN: NPO  ID:  Peri-op Abx.   Renal: Monitor strict I/O and renal function. AKI on re-admission to CTICU  Heme: Stable. No coumadin per CTS. Plavix for prior DES.  Endo:  Modified [facility] Insulin Protocol. Continue PTA methimazole. Prophylaxis: HOB>40, H2 blocker, DVT prophylaxis per primary team  Disposition/Family:  Needs ICU for OR takeback with concern for tamponade, high dose pressor mgmt, mechanical ventilation.   __________________________________________________________________________________    Prophylaxis Review:  Lines:  Yes; Arterial Line; Indication:  Frequent blood draws and Continuous BP monitoring; Location:  Radial  Central Line; Indication:  Hemodynamic monitoring; Type:  Internal jugular/PA  Urinary Catheter:  Yes; Retain foley due to:  Need for accurate Intake and Output  Antibiotic Usage: Surgical prophylaxis  VTE: SCDs    Lab Review:  Pertinent labs reviewed  Point of Care Testing:  (Last 24 hours):  Glucose: (!) 184 (07/24/18 2100)  POC Glucose (Download): (!) 213 (07/24/18 2040)    Radiology and Other Diagnostic Procedures Review:    Pertinent radiology reviewed.    Date of Service: 07/24/18    I saw this patient today on multidisciplinary rounds in conjunction with the CTICU team and bedside nurse. This patient is critically ill due to aortic valve replacement with postoperative vasogenic shock with dysfunction of at least one organ system.  I provided and personally directed critical care services including Ventilator Management, Invasive Hemodynamic Management, Management of Cardiac Support Infusions, Management of Vasogenic Shock, Comprehensive Pain Management and Complex Fluid & Electrolyte Management and the patient is at  risk for life threatening deterioration necessitating complex medical decision making and ongoing provision of ICU care    Critical Care Time: 60 minutes  Gabriel Cirri, MD  Assistant Professor  Anesthesiology and Critical Care

## 2018-07-25 NOTE — Progress Notes
Pt arrived to CTI 4 via bed per anesthesia. Vpaced per tele. Pt intubated/sedated. SBP 170s, titrating down Epi. Vaso and Levo off at this time. Surgical dressing C/D/I. Adequate uop per foley. Minimal CT output. Will continue to monitor.

## 2018-07-25 NOTE — Other
Procedure Note- Arterial Catheter Insertion    The L Wrist was sterilely prepped with chlorhexadine.  A 20 gauge arterial catheter was placed via modified Seldinger technique and taped into place.  1 needle stick(s) was/were required for cannulation of the artery.    Verification:  Patency verified by positive blood return.     **Procedure was done emergently**    ATTESTATION    Date of Service: 07/24/2018    I personally performed the procedure myself.    Staff name:  Sonny Dandy, MD  Date:  07/24/2018       Mick Sell, MD  Assistant Professor  Anesthesiology/Critical Care Medicine  Pager: [phone]

## 2018-07-25 NOTE — Progress Notes
Cardiothoracic Surgery Critical Care   Progress Note     Joel Hernandez  Today's Date:  07/25/2018  Admission Date: 07/21/2018  LOS: 4 days    POD: 0    Procedure: EXPLORATION POSTOPERATIVE WOUND - CHEST: 35820 (CPT???)       Procedure: INSERTION/ REPLACEMENT PERMANENT PACEMAKER WITH ATRIAL AND VENTRICULAR LEAD: 33208 (CPT???)  FLUOROSCOPY - CARDIAC: 16109 (CPT???) 6/26    Procedure: Aortic Root replacement (Freestyle)/Implantation of coronary artery buttons, Circ arrest: 33405 (CPT???) 6/24    POD#: 4    Principal Problem:    S/P AVR (aortic valve replacement) and aortoplasty  Active Problems:    Hypertension    Obstructive sleep apnea    Stenosis of lateral recess of lumbar spine    Thrombocytopenia (HCC)    HLD (hyperlipidemia)    Acute on chronic diastolic CHF (congestive heart failure), NYHA class 2 (HCC)    Hypokalemia    S/p bare metal coronary artery stent    Intramural hematoma of thoracic aorta (HCC)    Pseudoaneurysm of aorta (HCC)    Complete heart block (HCC)    Junctional rhythm    Respiratory acidosis    Vasogenic shock (HCC)    Lactic acidosis        Assessment/Plan:      Neuro ??? Precedex for sedation while intubated. Patient interactive post-code. Patient woke and followed commands post operatively.   CV ??? V paced 60, BP 112/51, CI 1.9, PA 37/19, CVP 7. Current gtts: on and off cardene. Map goal 70-80. Patient with intermittent heart block post AVR. PPM placed 6/26. Temporary wires removed 2022-08-22. Patient coded on CTP 08/22/22 for presumed vasovagal while in restroom. Patient received 1 round of chest compressions before achieving ROSC. Patient transferred to CTI. Echo with effusion. Pacer evaluated with appropriate function.  Patient initially hemodynamically stable after transferring to CTI but quickly decline. Patient required emergent intubation, PA cath placement, and arterial line placement. Patient taken back to OR emergently for mediastinal exploration. Resp ??? Chest x-ray moderate left effusion. Patient emergently intubated with respiratory acidosis. Continued acidosis with ventilator adjustment in CTI. Most recent ABG 7.23/52/76/18.8. Vent settings Volume control: TV 500 (increased from 450), rate 20, FI02 40%, peep 8. Will check frequent ABGs. Wean FiO2. Initiate IS, aggressive pulm toilet once extubated. Chest tubes in place.   Renal ??? Baseline Cr 1-1.1-->1.14-->1.17-->0.89-->1.3 post code/OR. Foley replaced with adequate UOP after returning from OR. Monitor BMP, assess for AKI.   GI - NPO while intuabted, GI prophylaxis while intubated, OG placed. Continue bowel regimen.  ID ??? Afebrile. WBC 12.2-->16.0-->22.3.POst op prophylaxis   Heme patient received multiple units of RBCs prior and in OR for bleeding.   Post-op Hg 12.8. Continue to monitor. Continue Plavix for history of DES.   FEN ??? If creatinine < 2.0, replace Mg and K to minimize cardiac arrhythmias. Hgb A1c 5.2%. Insulin gtt per Modified [facility] Protocol.    Activity ??? HOB to 30 degrees over 1st postop hour. Bedrest until extubated, dangle 6 hours after extubation. Early cardiac PT/OT.     Prophylaxis Review:  Lines:  Yes; Arterial Line; Indication:  Frequent blood draws and Continuous BP monitoring; Location:  Radial  Central Line; Indication:  Frequent blood draws, Med not deliverable peripherally, Incompatability of meds and Hemodynamic monitoring; Type:  Internal jugular  Antibiotic Usage:  Yes; Infection present or suspected:  post op prphylaxsis   VTE:  Mechanical prophylaxis; Foot pump  Urinary Catheter: Yes; Retain foley due to:  Need for accurate Intake and Output    Disposition: The patient is post-cardiac surgery at at risk for life threatening deterioration. Patient is critically ill s/p AVR with vasogenic shock, lactic acidosis, and respiratory acidosis.     I have seen, personally fully evaluated, and discussed patient with the critical care attending and cardiothoracic surgeon. The patient is critically ill, I spent 120 (60 minutes on 6/27 and 60 minutes 6/28)  minutes (excluding time spent performing procedures) providing and personally directing critical care services including direct OR recovery, ventilator management, hemodynamic monitoring and management, lab and radiology review, medication review and management, fluid and electrolyte management and coordination of care.     Rance Muir, APRN   [facility] Intensive Care  Pager [phone]  07/25/2018    Subjective:       HPI:   Joel Hernandez is a 78 y.o. male with pmhx of AS, CAD s/p PCI 04/2018, HTN, HLD, sleep apnea (doesn't need cpap after weight loss), former smoker, hyperthyroid, BPH. 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 His eliquis was dc'ed and he was started on plavix. Now s/p AVR & root replacement today 6/24 with Dr. Helen Hashimoto. He was brought to the ICU following the procedure.???  ???  POD 1:  CHB  POD 2:  Dual chamber PPM, CTs discontinued  POD 3:  Tele status, start Metop. Patient transferred to CTP. Patient coded presumable vasovagal while in the restroom. Patient assisted to floor and received 1 round of chest compressions before achieving ROSC. Patient transferred to CTI where he was initially stable. Patient quickly declined hemodynamically requiring emergent intubation, central line and arterial line placedment. Pacer evaluation with proper pacer function. TTE with moderate amount of fluid around heart with decreased function. Patient emergently taken to CVOR for mediastinal exploration. Patient currently recovering postoperatively in CTI.       REVIEW OF SYSTEMS:   Unable to assess. Patient intubated and sedated.     Objective:        Medications:  Scheduled Meds:acetaminophen (TYLENOL) tablet 1,000 mg, 1,000 mg, Oral, Q6H while awake  aspirin chewable tablet 81 mg, 81 mg, Oral, QDAY atorvastatin (LIPITOR) tablet 40 mg, 40 mg, Oral, QHS  clopiDOGrel (PLAVIX) tablet 75 mg, 75 mg, Oral, QDAY  docusate (COLACE) capsule 100 mg, 100 mg, Oral, BID  lidocaine (LIDODERM) 5 % topical patch 1-2 patch, 1-2 patch, Topical, QDAY    And  Verification of Patch Placement and Integrity - Lidocaine 5%, , Transdermal, BID  methIMAzole (TAPAZOLE) tablet 10 mg, 10 mg, Oral, QDAY    And  methIMAzole (TAPAZOLE) tablet 15 mg, 15 mg, Oral, QDAY  metoprolol tartrate (LOPRESSOR) tablet 12.5 mg, 12.5 mg, Oral, BID  pneumococcal 23-val vaccine (PPSV23) (PNEUMOVAX 23) injection 0.5 mL, 0.5 mL, Intramuscular, ONCE  polyethylene glycol 3350 (MIRALAX) packet 17 g, 1 packet, Oral, BID  potassium chloride in water IVPB 10 mEq, 10 mEq, Intravenous, Q1H X 4DO  potassium chloride oral solution 40 mEq, 40 mEq, Per OG Tube, ONCE  senna/docusate (SENOKOT-S) tablet 2 tablet, 2 tablet, Oral, BID  tamsulosin (FLOMAX) capsule 0.4 mg, 0.4 mg, Oral, QHS    Continuous Infusions:  ??? dexMEDEtomidine (PRECEDEX) 400 mcg/NS 100 ml IV drip (premade) 0.2 mcg/kg/hr (07/25/18 0203)   ??? EPINEPHrine (ADRENALIN) 4 mg in dextrose 5% (D5W) 250 mL IV drip (std conc) Stopped (07/25/18 0030)   ??? insulin regular 100 units/NS  100 mL IV drip (premade) Stopped (07/25/18 0125)   ??? niCARdipine (cardENE)    20 mg/200 mL NS IV drip (std conc)(premade) Stopped (07/25/18 0203)   ??? norepinephrine (LEVOPHED)   4 mg/250 mL NS IV drip (std conc)(premade) Stopped (07/25/18 0121)   ??? sodium chloride 0.9 %   infusion     ??? sodium chloride 0.9 %   infusion 1,000 mL (07/24/18 2150)   ??? vasopressin (VASOSTRICT) 20 Units in sodium chloride 0.9% (NS) 100 mL IV drip (std conc) Stopped (07/25/18 0020)     PRN and Respiratory Meds:[START ON 07/26/2018] acetaminophen Q6H PRN **OR** [START ON 07/26/2018] acetaminophen Q6H PRN, alum/mag hydroxide/simeth Q4H PRN, bisacodyL QDAY PRN, calcium chloride Intra-procedure Med, EPINEPHrine Intra-procedure Med, hydrALAZINE Q6H PRN, milk of magnesia (CONC) QDAY PRN, ondansetron Q6H PRN **OR** ondansetron (ZOFRAN) IV Q6H PRN, oxyCODONE Q4H PRN, potassium chloride SR PRN **OR** potassium chloride PRN **OR** potassium chloride in water PRN, sodium bicarbonate Intra-procedure Med, sodium chloride 0.9% (NS) Intra-procedure Med(cont), sodium chloride 0.9% (NS) Intra-procedure Med(cont)                       Vital Signs: Last Filed                  Vital Signs: 24 Hour Range   BP: 65/48 (06/27 2150)  Temp: 34.7 ???C (94.5 ???F) (06/28 0200)  Pulse: 60 (06/28 0200)  Respirations: 24 PER MINUTE (06/28 0200)  SpO2: 97 % (06/28 0200)  SpO2 Pulse: 60 (06/28 0200)  Height: 185.4 cm (73) (06/27 2225) BP: (51-139)/(27-73)   ABP: (57-174)/(29-66)   Temp:  [34.6 ???C (94.3 ???F)-37.1 ???C (98.8 ???F)]   Pulse:  [55-110]   Respirations:  [13 PER MINUTE-35 PER MINUTE]   SpO2:  [86 %-100 %]    Intensity Pain Scale (Self Report): (P) 0 (07/24/18 2030) Vitals:    07/21/18 0626 07/24/18 2225   Weight: 90.7 kg (199 lb 15.3 oz) 90.3 kg (199 lb)           Intake/Output Summary:  (Last 24 hours)    Intake/Output Summary (Last 24 hours) at 07/25/2018 0223  Last data filed at 07/25/2018 0200  Gross per 24 hour   Intake 5028 ml   Output 1569 ml   Net 3459 ml         Physical Exam:         Neuro: Sedated, MAE   Cardiovascular: RRR no rub or murmur  Respiratory: LS CTA bil - diminished in the bases  GI: soft, NT, hypoactive BS  Extremities: No Edema  Incisions: Sternal incision dressing, dry and intact. No crepitus or sternal instability.    Artificial airway:  Endotracheal Tube                                                                                         Vent weaning trial:  Per protocol    Laboratory:  LABS:  Recent Labs     07/22/18  0305 07/23/18  0335 07/24/18  0727 07/24/18  2100 07/25/18  0030   NA 141 135* 137 135* 142   K 4.2 3.9  3.9 4.0 2.9*   CL 110 105 106 102 106   CO2 21 22 23 21  20*   GAP 10 8 8 12  16* BUN 18 26* 26* 32* 31*   CR 1.14 1.17 0.89 1.38* 1.32*   GLU 149* 119* 117* 184* 172*   CA 8.2* 8.4* 8.2* 8.4* 11.6*   ALBUMIN  --   --   --  3.3* 2.4*   MG 3.0* 2.5 2.0  --   --        Recent Labs     07/22/18  0305 07/23/18  0335 07/24/18  0727 07/24/18  2100 07/25/18  0030   WBC 14.9* 14.6* 12.1* 16.0* 22.3*   HGB 10.6* 11.0* 11.3* 10.6* 12.8*   HCT 30.6* 32.3* 33.2* 31.6* 38.5*   PLTCT 72* 58* 91* 196 77*   INR  --   --   --  1.2 1.4*   PTT  --   --   --  24.4 30.8   AST  --   --   --  46* 46*   ALT  --   --   --  30 26   ALKPHOS  --   --   --  139* 96   TNI  --   --   --  5.61*  --       Estimated Creatinine Clearance: 59.9 mL/min (A) (based on SCr of 1.32 mg/dL (H)).  Vitals:    07/21/18 0626 07/24/18 2225   Weight: 90.7 kg (199 lb 15.3 oz) 90.3 kg (199 lb)    No results for input(s): PHART, PO2ART in the last 72 hours.    Invalid input(s): PC02A        Radiology and Other Diagnostic Procedures Review:    Reviewed

## 2018-07-25 NOTE — Anesthesia Pre-Procedure Evaluation
Anesthesia Pre-Procedure Evaluation    Name: Joel Hernandez      MRN: [id number]     DOB: 1940-09-05     Age: 78 y.o.     Sex: male   _________________________________________________________________________     Procedure Info:   Procedure Information     Anesthesia Start Date/Time:  07/24/18 2224    Procedure:  EXPLORATION POSTOPERATIVE WOUND - CHEST (N/A )    Location:  [facility]    Provider:  Earlene Plater (Trip) L III, MD          Physical Assessment  Vital Signs (last filed in past 24 hours):  BP: 65/48 (06/27 2150)  ABP: 145/63 (06/27 2220)  Temp: 36.2 ???C (97.2 ???F) (06/27 2201)  Pulse: 63 (06/27 2220)  Respirations: 34 PER MINUTE (06/27 2218)  SpO2: 93 % (06/27 2218)  Height: 185.4 cm (73) (06/27 2225)  Weight: 90.3 kg (199 lb) (06/27 2225)      Patient History   Allergies   Allergen Reactions   ??? Lisinopril SEE COMMENTS     Reports respiratory distress and back pain        Current Medications    Medication Directions   atorvastatin (LIPITOR) 40 mg tablet Take one tablet by mouth daily.   clopiDOGrel (PLAVIX) 75 mg tablet Take one tablet by mouth daily.   enoxaparin (LOVENOX) 100 mg syringe Inject 1 mL under the skin every 12 hours.   losartan (COZAAR) 100 mg tablet Take one tablet by mouth daily.   methIMAzole (TAPAZOLE) 10 mg tablet Take  by mouth twice daily. Take 1 tablet in the morning and 1.5 tablets in the evening   metoprolol tartrate (LOPRESSOR) 50 mg tablet Take one-half tablet by mouth twice daily.   nitroglycerin (NITROSTAT) 0.4 mg tablet Place one tablet under tongue every 5 minutes as needed for Chest Pain. Max of 3 tablets, call 911.   tamsulosin (FLOMAX) 0.4 mg capsule Take 1 capsule by mouth at bedtime daily.     Medical History:   Diagnosis Date   ??? Aortic stenosis    ??? Atrial fibrillation (HCC)    ??? Back pain    ??? CAD (coronary artery disease)    ??? Disorder of thyroid gland    ??? Heart murmur 09/15/2013   ??? Hyperlipidemia    ??? Hypertension 09/15/2013 ??? NSTEMI (non-ST elevated myocardial infarction) (HCC) 12/2015    s/p stent placement   ??? OSA on CPAP     No CPAP use after 55lb weight loss   ??? 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 SCC (squamous cell carcinoma), arm    ??? Severe aortic stenosis    ??? Skin cancer    ??? Sleep apnea      Social History     Tobacco Use   ??? Smoking status: Former Smoker     Packs/day: 0.50     Years: 15.00     Pack years: 7.50     Types: Cigarettes     Last attempt to quit: 11/20/1969     Years since quitting: 48.7   ??? Smokeless tobacco: Never Used   Substance Use Topics   ??? Alcohol use: No   ??? Drug use: Never     Surgical History:   Procedure Laterality Date   ??? LUMBAR LAMINECTOMY  2004    L4-5   ??? LUMBAR LAMINECTOMY  2011  Hemilaminectomies L 2-3 L3-4   ??? RIGHT LUMBAR 4-5 REDO LAMINECTOMY AND LATERAL RECESS DECOMPRESSION Right 12/04/2016    Performed by Theotis Barrio, MD at [facility] OR   ??? LAMINECTOMY/ FACETECTOMY/ FORAMINOTOMY WITH DECOMPRESSION - 1 VERTEBRAL SEGMENT - LUMBAR  12/04/2016    Performed by Theotis Barrio, MD at [facility] OR   ??? CORONARY STENT PLACEMENT  12/2016   ??? KNEE REPLACEMENT Right 08/2017   ??? ANGIOGRAPHY CORONARY ARTERY WITH LEFT HEART CATHETERIZATION N/A 05/24/2018    Performed by Marcell Barlow, MD at [facility] CATH LAB   ??? POSSIBLE PERCUTANEOUS CORONARY STENT PLACEMENT WITH ANGIOPLASTY N/A 05/24/2018    Performed by Marcell Barlow, MD at [facility] CATH LAB   ??? CLOSURE PSEUDOANEURYSM BY INJECTION- LOWER EXTREMITY N/A 05/26/2018    Performed by Marcell Barlow, MD at [facility] CATH LAB   ??? CORONARY STENT PLACEMENT  05/2018    x2   ??? Aortic Root replacement (Freestyle)/Implantation of coronary artery buttons, Circ arrest N/A 07/21/2018    Performed by Earlene Plater (Trip) L III, MD at [facility] CVOR   ??? COLONOSCOPY     ??? HEART CATHETERIZATION  01/2016, 05/2018    Stent x 1 LAD placement following NSTEMI, 2 stents placed 04/2018   ??? HX HEART CATHETERIZATION ??? SKIN CANCER EXCISION  2017, 2018    SCC Arm          Review of Systems/Medical History      Patient summary reviewed  Nursing notes reviewed  Pertinent labs reviewed    PONV Screening: Non-smoker  No history of anesthetic complications  No family history of anesthetic complications        Pulmonary       Not a current smoker (quit 1971)      no COPD      No pulmonary embolus      No recent URI      No sleep apnea (resolved after weight loss)      Cardiovascular       Recent diagnostic studies:          ECG and echocardiogram            EKG 05/27/2018: SB, no acute/ischemic changes noted    ECHO 01/2018:   Interpretation Summary     ???  ??? Technically difficult study  ??? The aortic valve is heavily calcified and although the valve is not ideally visualized structurally, the Doppler parameters (including peak velocity, mean gradient, valve area by continuity equation and dimensionless index) would be consistent with severe stenosis  ??? Hyperdynamic left ventricular function. Hyperdynamic LV systolic function with an ejection fraction in the range of 70%  ??? Grade II (moderate) left ventricular diastolic dysfunction. Elevated left atrial pressure  ??? Mild concentric LV hypertrophy  ??? Estimated Peak Systolic PA Pressure 25 mmHg     Prior studies dated 04/02/2017 (TTE) and 2016-03-06 (TEE) are reviewed for comparison.  The aortic valve was consistent with a 3 cusp heavily calcified valve based on the TEE.  Many of the Doppler parameters on the previous study are similar to the current examination, although the calculated valve area has reduced and the dimensionless index is lower as well.  The 04/15/2017 parameters included:    Aortic valve area   1.21 cm2      Aortic valve mean gradient   43 mmHg      Aortic valve peak gradient   77 mmHg      Aortic valve peak velocity  4.4 m/s      Aortic valve velocity ratio   0.30       The previous estimated systolic pulmonary artery pressure was =47 mmHg. 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        Carotid US 05/24/2018:  Interpretation Summary     1. 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 Based on end-diastolic velocity and ratio criteria stenosis is likely less than 70%.   2. No other hemodynamically significant (>50%) stenosis measured in the bilateral common carotid and left internal carotid arteries. Mild atheromatous plaque visualized in left internal carotid artery.  3. There is normal antegrade flow in bilateral vertebral arteries  4. No evidence of proximal subclavian stenosis bilaterally  5. Bilaterally enlarged thyroid gland noted.  Dedicated thyroid ultrasound is recommended if clinically indicated.  ???  There are no prior studies available for comparison.    Interpretation Summary     1. 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 Based on end-diastolic velocity and ratio criteria stenosis is likely less than 70%.   2. No other hemodynamically significant (>50%) stenosis measured in the bilateral common carotid and left internal carotid arteries. Mild atheromatous plaque visualized in left internal carotid artery.  3. There is normal antegrade flow in bilateral vertebral arteries 4. No evidence of proximal subclavian stenosis bilaterally  5. Bilaterally enlarged thyroid gland noted.  Dedicated thyroid ultrasound is recommended if clinically indicated.  ???  There are no prior studies available for comparison.          GI/Hepatic/Renal         No GERD,       No hepatitis      Liver disease      Cirrhosis     No renal disease      No electrolyte problems      Neuro/Psych       No seizures      No hx TIA      No CVA      Neuropathy (R foot drop x 2 years)      No indications/hx of sensory deficit      No indications/hx of dementia      No psychiatric history      Musculoskeletal         Back pain      Endocrine/Other       No diabetes      No hypothyroidism      Hyperthyroidism (tapazole)  No autoimmune disease      Malignancy (skin cancer)   Physical Exam    Airway Findings      Mallampati: III      TM distance: >3 FB      Neck ROM: full      Mouth opening: good    Dental Findings: Negative      Cardiovascular Findings:       Rhythm: regular      Rate: normal    Pulmonary Findings:       Breath sounds clear to auscultation.    Abdominal Findings:         Abdominal exam deferred    Neurological Findings:       Alert and oriented x 3    Constitutional findings:       No acute distress      Well-developed       Diagnostic Tests  Lab Results   Component Value Date    FVCPRE 4.15 05/24/2018    FVCPREDPRE 96 05/24/2018    FEV1PRE 3.39 05/24/2018    FEV1PREDPRE 109 05/24/2018     Hematology:   Lab Results   Component Value Date    HGB 10.6 07/24/2018    HCT 31.6 07/24/2018    PLTCT 196 07/24/2018    WBC 16.0 07/24/2018    NEUT 79 06/09/2018    ANC 6.10 06/09/2018    ALC 0.94 06/09/2018    MONA 6 06/09/2018    AMC 0.44 06/09/2018    EOSA 2 06/09/2018    ABC 0.07 06/09/2018    MCV 95.8 07/24/2018    MCH 31.9 07/24/2018    MCHC 33.3 07/24/2018    MPV 10.6 07/24/2018    RDW 15.3 07/24/2018         General Chemistry:   Lab Results   Component Value Date    NA 135 07/24/2018    K 4.0 07/24/2018 CL 102 07/24/2018    CO2 21 07/24/2018    GAP 12 07/24/2018    BUN 32 07/24/2018    CR 1.38 07/24/2018    GLU 184 07/24/2018    CA 8.4 07/24/2018    ALBUMIN 3.3 07/24/2018    LACTIC 0.5 06/09/2018    MG 2.0 07/24/2018    TOTBILI 0.6 07/24/2018    PO4 2.9 01/18/2016      Coagulation:   Lab Results   Component Value Date    PTT 24.4 07/24/2018    INR 1.2 07/24/2018         Anesthesia Plan    ASA score: 4 emergent   Plan: general  Special equipment/procedures: TEE, Art line and CVC  Induction method: intravenous  NPO status: acceptable      Informed Consent  Anesthetic plan and risks discussed with patient.  Use of blood products discussed with patient  Blood Consent: consented      Plan discussed with: anesthesiologist and resident.

## 2018-07-25 NOTE — Other
Brief Operative Note    Name: Joel Hernandez is a 78 y.o. male     DOB: [birth year]             MRN#: [id number]  DATE OF OPERATION: 07/24/2018    Date:  07/25/2018        Preoperative Dx:   Cardiogenic shock     Post-op Diagnosis   Cardiogenic shock     Procedure(s):  Right central venous line introducer placement  Swan catheter  placement     Anesthesia Type: Defer to Anesthesia    Attending: Mick Sell, MD    Fellow: Drue Novel, MD    Central venous line introducer catheter  was placed into the right IJ under US guidance. Modified Seldinger technique was used to place it. This was sutured to the skin     Swan catheter:  Following placement of the introducer, the sterile field was left intact and a standard flow-directed pulmonary artery catheter was inserted through it.  The balloon was inflated at 20 cm.  The ventricular wave form was obtained at 42 cm.  The pulmonary artery wave form was obtained at 46 cm.   At the end of the procedure the catheter was secured at 50 cm.  The post-procedure chest radiograph showed the catheter to be placed in satisfactory position. Sterile dressing was applied.    Dr. Justus Memory was present during the entire procedure    Drue Novel, MD  Pager [phone]

## 2018-07-25 NOTE — Other
Brief Operative Note    Name: [name] is a 78 y.o. male     DOB: [birth year]             MRN#: [id number]  DATE OF OPERATION: 07/24/2018    Date:  07/25/2018        Preoperative Dx:   Cardiogenic shock    Post-op Diagnosis   Cardiogenic shock   Tamponade      Procedure(s):  EXPLORATION POSTOPERATIVE WOUND - CHEST  BRONCHOSCOPY     Anesthesia Type: Defer to Anesthesia    Surgeon(s) and Role:     * Azucena Cecil (Trip) Stan Head, MD - Primary     * Verita Lamb, PA-C - Assisting     * Drue Novel, MD - Fellow      Findings:  Tamponade, hemothorax on left.    Estimated Blood Loss: No blood loss documented.     Specimen(s) Removed/Disposition: * No specimens in log *    Complications:  Hematoma    Implants: None    Drains: Other 4 24Fr Chest tubes    Disposition:  ICU - stable    Drue Novel, MD  Pager [phone]

## 2018-07-25 NOTE — Progress Notes
RT Adult Assessment Note    NAME:Joel Hernandez             MRN: [id number]             DOB:06-Aug-1940          AGE: 78 y.o.  ADMISSION DATE: 07/21/2018             DAYS ADMITTED: LOS: 4 days    RT Treatment Plan:       Protocol Plan: Procedures  PEP Therapy: Q4h while awake & PRN(per CTS protocol)  PAP: Q4h while awake & PRN(per CTS protocol)  Oxygen/Humidity: O2 to keep SpO2 > 92%, if not on any RT modality, D/C protocol if greater than 24 hours on room air  SpO2: BID & PRN    Additional Comments:  Impressions of the patient: Patient on 1 LPM, recently extubated this AM without issue. Complaining of pain. LE started. Denies pulm hx.   Intervention(s)/outcome(s): RT evaluation complete, PEP/PAP/IS started. o2 titrated  Patient education that was completed: PEP/PAP/IS  Recommendations to the care team: titrate o2 as able    Vital Signs:  Pulse: 60  RR: 20 PER MINUTE  SpO2: 100 %  O2 Device: Cannula  Liter Flow: 1 Lpm  O2%:    Breath Sounds:    Respiratory Effort: Non-Labored

## 2018-07-25 NOTE — Progress Notes
2025 Pt arrived to CTI 4 via bed per 2 RNs. Pt on 15L NRB SpO2 100%. Pt lethargic, oriented x4. Follows RN commands. V paced HR 60. BP 112/66. Pt states he does not know if he is in pain, moaning occasionally. S. Hemmings APRN at bedside. No gtts at this time. Will continue to monitor.     2035. Dr. Justus Memory at bedside performing ECHO.    2042 Pacemaker interrogated at this time.

## 2018-07-25 NOTE — Progress Notes
STEMI activation    Patient is a 78 year old male with past medical history of aortic stenosis s/p AVR on 07/21/2018, coronary artery disease s/p PCI, hypertension, hyperlipidemia, atrial fibrillation, hypothyroidism, obstructive sleep apnea who recently underwent AVR avnd aortic root replacement on 07/21/2018. 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 Patient lost consciousness while he was in the bathroom today and code blue was called. ROSC was achieved with 1 min of CPR and patient was transferred to CT ICU 304. His pacemaker was interrogated and it showed that the patient was V-paced 100% of the time and the patient had AT/AF >99%. No VT/VF was noted. Patient had a formal ECHO performed where suspicion of a worsening LV systolic function was raised and STEMI was activated.     Patient was already getting a PA catheter followed by endotracheal intubation during my encounter. No further history could be acquired.     Exam: BP: 70/38 mmHg  HR: 110/min    Chest: Bilateral coarse breath sounds  CV: S1S2, could not appreciate murmur    CXR showed widened mediastinum with enlarged cardiac shadow,  Echocardiogram shows possible circumferential pericardial echodensity which could suggest clots.     Plan  - STEMI activation was cancelled  - Patient to be taken to the OR by the CT surgery team    Patient seen with Dr. Marlinda Mike and Dr. Donneta Romberg.     Delma Officer  PGY-4 Fellow, Cardiovascular disease  Pager: [phone]

## 2018-07-25 NOTE — Progress Notes
Mr. Joel Hernandez has become increasingly unstable through the evening.  He had a short arrest upstairs and required a few compressions.  He did recover a pulse and was transferred out of the ICU.  Central access has been obtained and an echo has shown moderate amount of fluid around his heart.  It does appear that his ventricular function may be decreased.    His mediastinum is markedly widened on his chest x-ray.  We are returning the operating room for exploration.  I have called his son on the phone and explained the serious nature of the situation.  He has expressed understanding.  Because this is an emergency we are not obtaining formal consent.    Pryor Curia, M.D.

## 2018-07-25 NOTE — Other
Procedure Note- Endotracheal Intubation    Indication for endotracheal intubation: Respiratory failure  Sedation: None  Paralytic: None  Lidocaine: No  Atropine: No  Equipment: Macintosh 3 laryngoscope blade. [facility])  Cricoid Pressure: No  Number of attempts: 1  ETT location confirmed by auscultation and ETCO2 monitor.    **Procedure was done emergently**    ATTESTATION    Date of Service: 07/24/2018    I personally performed the procedure myself.    Staff name:  Sonny Dandy, MD  Date:  07/24/2018     Mick Sell, MD  Assistant Professor  Anesthesiology/Critical Care Medicine  Pager: [phone]

## 2018-07-25 NOTE — Progress Notes
Patient arrived to room # [facility] 304) via bed accompanied by RN.  Bedside safety checks completed. Initial patient assessment completed. Refer to flowsheet for details.    Admission skin assessment completed with: Melanie Fullmer    Pressure injury present on arrival?: No    1. Head/Face/Neck: No  2. Trunk/Back: No  3. Upper Extremities: No  4. Lower Extremities: No  5. Pelvic/Coccyx: No  6. Assessed for device associated injury? Yes  7. Malnutrition Screening Tool (Nursing Nutrition Assessment) Completed? No    See Doc Flowsheet for additional wound details.

## 2018-07-25 NOTE — Progress Notes
2130 Pt diaphoretic, labored breathing, SBP 50s, V paced 60s. Pt states he just doesn't feel good. LR bolus infusing. Dr. Justus Memory, Dr. Dagoberto Ligas and S. Hemmings APRN at bedside. Orders received to start Epi, Levo, and Vaso gtts. Art line and swan inserted at bedside. Calcium chloride, bicarb, and Epi IV push given, see code documentation.     2156 Pt unresponsive with palpable pulse. Pt intubated per Dr. Justus Memory.     2205 Pt restless, Versed given. Nods appropriately to questions.     2214 Dr. Azzie Glatter at bedside.     2226 Pt transferred to [place]. VSS on Epi, Levo, Vaso, 2 units PRBC.

## 2018-07-25 NOTE — Response Teams
Code Blue Team Progress Note    Date: 07/24/2018 Time: 10:29 PM  Patient: Joel Hernandez  Attending: Azucena Cecil (Trip) L II* Service: Cardiothor Surg  Admission Date: 07/21/2018  LOS: 3 days    A Code/Rapid Response Timeline Event Report has been created for this patient on 07/24/2018 at 2010.    ETT Confirmation Method: Patient did not require intubation.    The lead provider for this event was-no lead provider as patient had pulse upon code team arrival.  Dr Justus Memory present and accepted patient to CTICU.    Chester Holstein, RN    See code start note for additional event information.

## 2018-07-25 NOTE — Progress Notes
Pt awake, MAE. Follows RN commands. Opens eyes to speech and nods appropriately. Precedex gtt started at this time.

## 2018-07-25 NOTE — Response Teams
Code/Rapid Response Medication Nurse Sign Off      Patient: Joel Hernandez    I have reviewed the Code/Rapid Response Timeline Event Report and confirm that I administered the medications listed on this patient during the event on date 6/27 at event start time 2124.

## 2018-07-25 NOTE — Progress Notes
Reassessment complete, see flow sheet. CI 1.27 S. Hemmings APRN aware. New orders received. Adequate uop. Minimal CT output. Pt on Levo, Vaso Precedex, and NS. Will continue to monitor.

## 2018-07-26 ENCOUNTER — Encounter: Admit: 2018-07-26 | Discharge: 2018-07-26

## 2018-07-26 DIAGNOSIS — I10 Essential (primary) hypertension: Secondary | ICD-10-CM

## 2018-07-26 DIAGNOSIS — E785 Hyperlipidemia, unspecified: Secondary | ICD-10-CM

## 2018-07-26 DIAGNOSIS — M549 Dorsalgia, unspecified: Secondary | ICD-10-CM

## 2018-07-26 DIAGNOSIS — C44621 Squamous cell carcinoma of skin of unspecified upper limb, including shoulder: Secondary | ICD-10-CM

## 2018-07-26 DIAGNOSIS — G4730 Sleep apnea, unspecified: Secondary | ICD-10-CM

## 2018-07-26 DIAGNOSIS — I724 Aneurysm of artery of lower extremity: Secondary | ICD-10-CM

## 2018-07-26 DIAGNOSIS — I350 Nonrheumatic aortic (valve) stenosis: Secondary | ICD-10-CM

## 2018-07-26 DIAGNOSIS — I4891 Unspecified atrial fibrillation: Secondary | ICD-10-CM

## 2018-07-26 DIAGNOSIS — R011 Cardiac murmur, unspecified: Secondary | ICD-10-CM

## 2018-07-26 DIAGNOSIS — E079 Disorder of thyroid, unspecified: Secondary | ICD-10-CM

## 2018-07-26 DIAGNOSIS — C4490 Unspecified malignant neoplasm of skin, unspecified: Secondary | ICD-10-CM

## 2018-07-26 DIAGNOSIS — I214 Non-ST elevation (NSTEMI) myocardial infarction: Secondary | ICD-10-CM

## 2018-07-26 DIAGNOSIS — I2510 Atherosclerotic heart disease of native coronary artery without angina pectoris: Secondary | ICD-10-CM

## 2018-07-26 DIAGNOSIS — G4733 Obstructive sleep apnea (adult) (pediatric): Secondary | ICD-10-CM

## 2018-07-26 LAB — URINALYSIS DIPSTICK REFLEX TO CULTURE
Lab: NEGATIVE
Lab: NEGATIVE
Lab: NEGATIVE

## 2018-07-26 LAB — O2 SATURATION, MIXED VENOUS
Lab: 61 %
Lab: 48 %

## 2018-07-26 LAB — MAGNESIUM: Lab: 1.8 mg/dL — ABNORMAL LOW (ref 1.6–2.6)

## 2018-07-26 LAB — POC GLUCOSE
Lab: 127 mg/dL — ABNORMAL HIGH (ref 70–100)
Lab: 105 mg/dL — ABNORMAL HIGH (ref 70–100)
Lab: 95 mg/dL (ref 70–100)
Lab: 98 mg/dL (ref 70–100)

## 2018-07-26 LAB — URINALYSIS MICROSCOPIC REFLEX TO CULTURE

## 2018-07-26 LAB — MISC REFERENCE TEST: Lab: 1 MMOL/L

## 2018-07-26 LAB — BASIC METABOLIC PANEL: Lab: 136 MMOL/L — ABNORMAL LOW (ref 137–147)

## 2018-07-26 LAB — CBC: Lab: 21 K/UL — ABNORMAL HIGH (ref 4.5–11.0)

## 2018-07-26 MED ORDER — FUROSEMIDE 10 MG/ML IJ SOLN
20 mg | Freq: Once | INTRAVENOUS | 0 refills | Status: CP
Start: 2018-07-26 — End: ?
  Administered 2018-07-26: 21:00:00 20 mg via INTRAVENOUS

## 2018-07-26 MED ORDER — PERFLUTREN LIPID MICROSPHERES 1.1 MG/ML IV SUSP
1-20 mL | Freq: Once | INTRAVENOUS | 0 refills | Status: CP | PRN
Start: 2018-07-26 — End: ?
  Administered 2018-07-26: 14:00:00 4 mL via INTRAVENOUS

## 2018-07-26 MED ORDER — DOBUTAMINE IN D5W 1,000 MG/250 ML (4,000 MCG/ML) IV SOLP
1 ug/kg/min | INTRAVENOUS | 0 refills | Status: DC
Start: 2018-07-26 — End: 2018-07-28
  Administered 2018-07-26: 22:00:00 3 ug/kg/min via INTRAVENOUS

## 2018-07-26 MED ORDER — VANCOMYCIN 1,250 MG IVPB
15 mg/kg | Freq: Two times a day (BID) | INTRAVENOUS | 0 refills | Status: DC
Start: 2018-07-26 — End: 2018-07-28
  Administered 2018-07-26 – 2018-07-28 (×8): 1250 mg via INTRAVENOUS

## 2018-07-26 MED ORDER — AMPICILLIN/SULBACTAM 3G/100ML NS IVPB (MB+)
3 g | INTRAVENOUS | 0 refills | Status: DC
Start: 2018-07-26 — End: 2018-08-05
  Administered 2018-07-26 – 2018-08-05 (×80): 3 g via INTRAVENOUS

## 2018-07-26 MED ORDER — DOXYCYCLINE HYCLATE 100 MG PO TAB
100 mg | Freq: Two times a day (BID) | ORAL | 0 refills | Status: DC
Start: 2018-07-26 — End: 2018-08-05
  Administered 2018-07-26 – 2018-08-05 (×21): 100 mg via ORAL

## 2018-07-26 MED ORDER — VANCOMYCIN PHARMACY TO MANAGE
1 | 0 refills | Status: DC
Start: 2018-07-26 — End: 2018-08-05

## 2018-07-26 MED ORDER — MAGNESIUM SULFATE IN D5W 1 GRAM/100 ML IV PGBK
1 g | Freq: Once | INTRAVENOUS | 0 refills | Status: CP
Start: 2018-07-26 — End: ?
  Administered 2018-07-26: 09:00:00 1 g via INTRAVENOUS

## 2018-07-26 NOTE — Progress Notes
OCCUPATIONAL THERAPY  ASSESSMENT NOTE      Name: Joel Hernandez        MRN: [id number]          DOB: 09/25/1940          Age: 78 y.o.  Admission Date: 07/21/2018             LOS: 5 days      Mobility  Patient Turn/Position: Chair  Progressive Mobility Level: Walk in room  Distance Walked (feet): 5 ft  Level of Assistance: Assist X2  Assistive Device: Hand Held  Time Tolerated: 11-30 minutes  Activity Limited By: Lethargy;Weakness    Subjective  Pertinent Dx per Physician: PMH: coronary disease status post PCI on 04/2018, aortic stenosis, hypertension, hyperlipidemia, OSA, hypothyroidism and BPH.  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  Patient underwent AVR and root replacement on 6/24.  He was admitted to CTICU for post operative management. Return to OR 6/27 for hemotama evacuation  Precautions: Sternal Precautions;Falls  Pain / Complaints: Patient agrees to participate in therapy;Patient demonstrates nonverbal signs of pain  Pain Location: Incisional  Pain Level Current: (does not rate)    Objective  Psychosocial Status: Willing and Cooperative to Participate  Persons Present: Physical Therapist;Nursing Staff    Home Living  Type of Home: House  Home Layout: Two Level  Financial risk analyst / Tub: Tub/Shower Unit  Home Equipment: Cane    Prior Function  Level Of Independence: Independent with ADLs and functional transfers;Independent with homemaking w/ ambulation  Lives With: Alone  Receives Help From: None Needed  Leisure: Hobbies-Yes (comments)(hunting and fishing)  Other Function Comments: 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    ADL's  Where Assessed: Edge of Bed;Supine, Bed  LE Dressing Assist: Maximum Assist  LE Dressing Deficits: Don/Doff R Sock;Don/Doff L Sock  Toileting Assist: Total Assist    ADL Mobility  Bed Mobility: Supine to Sit: Minimal assist;x2 people  Bed Mobility Comments: cues for sternal precuations  Transfer Type: Sit to stand Transfer: Assistance Level: From;Bed;Minimal assist;x2 people  Transfer: Assistive Device: Hand hold assist  Transfer: Type of Assistance: For balance;For safety considerations;For strength deficit;Requires extra time  End of Activity Status: Up in chair;Nursing notified;Instructed patient to request assist with mobility  Transfer Comments: Stands and takes steps to chair with minimum assist x2 for safety.     Activity Tolerance  Endurance: 2/5 Tolerates 10-20 Minutes Exercise w/Multiple Rests  Comment: lethargic    Cognition  Comprehension: WFL to Adequately Complete Self Care Tasks Safely  Expression: Increased Time for Expression  Social Interaction: Increased Time to Adjust  Problem Solving: Direction Following Assist  Orientation: Alert & Oriented x2    UE AROM  Coordination: Adequate to Complete ADLs  Grasp: Bilateral Grasp Functional for Activity    UE Strength / Tone  Strength/Tone Not Assessed: Due to Precautions  Overall Strength / Tone: WFL Able to Perform ADL Tasks    Education  Persons Educated: Patient  Teaching Methods: Verbal Instruction;Demonstration  Patient Response: Verbalized and Demo Understanding  Topics: Role of OT, Goals for Therapy  Goal Formulation: With Patient    Assessment  Assessment: Decreased ADL Status;Decreased Safe/Judg during ADL;Decreased Endurance;Decreased Self-Care Trans;Decreased High-Level ADLs  Prognosis: Good;w/Cont OT s/p Acute Discharge  Goal Formulation: Patient  Comments: Pt improving as expected, anticipate continued progress with therapies.     Plan  Progress: Improving as Expected  OT Frequency: 5x/week  OT Plan for Next Visit:  grooming at sink, toileting, dressing with precautions    Further Evaluation Goals  Pt Will Tolerate Further ADL Evaluation: w/in1-2 sessions    ADL Goals  Patient Will Perform Grooming: Standing at Sink;w/ Stand By Assist  Patient Will Perform LE Dressing: w/ Stand By Assist    Functional Transfer Goals Pt Will Perform All Functional Transfers: w/ Stand By Assist    OT Discharge Recommendations  Recommendation: Currently patient requires inpatient level of care. However, typical progression for patient condition would anticipate home with assistance at time of discharge.  Patient Currently Requires Physical Assist With: All mobility;All personal care ADLs;All home functioning ADLs    Therapist: Serita Kyle, OTR/L [id number]  Date: 07/26/2018

## 2018-07-26 NOTE — Case Management (ED)
Case Management Progress Note    NAME:Joel Hernandez                          MRN: [id number]              DOB:11-May-1940          AGE: 78 y.o.  ADMISSION DATE: 07/21/2018             DAYS ADMITTED: LOS: 5 days      Todays Date: 07/26/2018    Plan  Continue inpt care, OR take back on 06/27 for Postop mediastinum hematoma.    Interventions  ? Support      ? Info or Referral      ? Discharge Planning   EMR and POC reviewed   DC needs TBD- ID consulted and will likely need IV abx for suspected Endocarditis.    CM team to continue to follow and intervene w dc planning once more info known.   ? Medication Needs         ? Financial      ? Legal      ? Other        Disposition  ? Expected Discharge Date    Expected Discharge Date: 07/27/18  Expected Discharge Time: 1400  ? Transportation   Does the patient need discharge transport arranged?: No  Transportation Name, Phone and Availability #1: Son - Severin Bou - [phone]  Does the patient use Medicaid Transportation?: No  ? Discharge Disposition   TBD- possible IV abx.    Vanita Ingles BSN, RN  Integrated Nurse Case Manager  Inpatient Cardiothoracic Surgery   O: [phone]

## 2018-07-26 NOTE — Progress Notes
PHYSICAL THERAPY  ASSESSMENT      Name: Joel Hernandez        MRN: [id number]          DOB: [birth year]          Age: 78 y.o.  Admission Date: 07/21/2018             LOS: 5 days      Mobility  Patient Turn/Position: Chair  Progressive Mobility Level: Walk in room  Distance Walked (feet): 5 ft  Level of Assistance: Assist X2  Assistive Device: Hand Held  Time Tolerated: 11-30 minutes  Activity Limited By: Lethargy;Weakness    Subjective  Significant hospital events:  s/p AVR & root replacement today 6/24, PPM 6/26 cardiac arrest 6/27, cardiogenic shock sas taken back to OR for tamponade 6/28  Mental / Cognitive Status: Lethargic;Cooperative  Persons Present: Nursing Staff;Occupational Therapist  Pain: Patient has no complaint of pain  Ambulation Assist: Independent Mobility in Community without Device  Patient Owned Equipment: Single [employer] Situation: Lives Alone  Type of Home: House  In-Home Stairs: 1-2 Flights of Stairs(to bedroom)    Strength  Overall Strength: Generalized Weakness  Strength Comment: Right foot drop at baseline       Bed Mobility/Transfer  Bed Mobility: Supine to Sit: Minimal Assist;x2 People;Assist with B LE;Assist with Trunk  Transfer Type: Sit to Stand  Transfer: Assistance Level: To/From;Bed;Minimal Assist;x2 People  Transfer: Assistive Device: Doctor, general practice  Transfers: Type Of Assistance: Verbal Cues;To Maintain Precautions;For Safety Considerations  End Of Activity Status: Up in Chair    Balance  Sitting Balance: Static Sitting Balance;No UE Support;Minimal Assist;x2 People  Standing Balance: Dynamic Standing Balance;2 UE support;Moderate Assist    Gait  Comments: Patient able to take a few steps over to the chair however, not safe enough to attempt further ambulation at this time.  unable to reallt clear his feet for ambulation     Education  Persons Educated: Patient  Patient Barriers To Learning: None Noted  Teaching Methods: Verbal Instruction Patient Response: Verbalized Understanding  Topics: Plan/Goals of PT Interventions;Up with Assist Only;Importance of Increasing Activity    Assessment/Progress  Assessment/Progress: Should Improve w/ Continued PT    AM-PAC 6 Clicks Basic Mobility Inpatient  Turning from your back to your side while in a flat bed without using bed rails: A Little  Moving from lying on your back to sitting on the side of a flatbed without using bedrails : A Lot  Moving to and from a bed to a chair (including a wheelchair): A Lot  Standing up from a chair using your arms (e.g. wheelchair, or bedside chair): A Lot  To walk in hospital room: A Lot  Climbing 3-5 steps with a railing: A Lot  Raw Score: 13  Standardized (T-scale) Score: 33.99  Basic Mobility CMS 0-100%: 57.65  CMS G Code Modifier for Basic Mobility: CK    Goals  Goal Formulation: With Patient  Time For Goal Achievement: 7 days  Patient Will Go Supine To/From Sit: w/ Stand By Assist  Patient Will Transfer Sit to Stand: w/ Stand By Assist  Patient Will Ambulate: Greater than 200 Feet, w/ Dan Humphreys, w/ Stand By Assist    Plan  Treatment Interventions: Mobility Training;Balance Activities;Endurance Training  Plan Frequency: 5 Days per Week  PT Plan for Next Visit: work bed mobility and sit to stand transfers while maintaining sternal precautions and attempt further ambulation with chair follow     PT  Discharge Recommendations  Recommendation: Inpatient setting    Therapist  Lynett Grimes, PT, DPT   Date  07/26/2018

## 2018-07-26 NOTE — Progress Notes
Pharmacy Vancomycin Note  Subjective:   Joel Hernandez is a 78 y.o. 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    Objective:     Current Vancomycin Orders   Medication Dose Route Frequency    vancomycin (VANCOCIN) 1,250 mg in sodium chloride 0.9% (NS) IVPB  15 mg/kg Intravenous Q12H*    vancomycin, pharmacy to manage  1 each Service Per Pharmacy     Start Date of  vancomycin therapy: 07/26/2018    White Blood Cells   Date/Time Value Ref Range Status   07/26/2018 0300 21.3 (H) 4.5 - 11.0 K/UL Final   07/25/2018 1755 22.4 (H) 4.5 - 11.0 K/UL Final   07/25/2018 1415 19.9 (H) 4.5 - 11.0 K/UL Final   07/25/2018 0437 24.5 (H) 4.5 - 11.0 K/UL Final   07/25/2018 0030 22.3 (H) 4.5 - 11.0 K/UL Final     Creatinine   Date/Time Value Ref Range Status   07/26/2018 0300 1.01 0.4 - 1.24 MG/DL Final   07/25/2018 1415 1.32 (H) 0.4 - 1.24 MG/DL Final   07/25/2018 0437 1.14 0.4 - 1.24 MG/DL Final     Blood Urea Nitrogen   Date/Time Value Ref Range Status   07/26/2018 0300 32 (H) 7 - 25 MG/DL Final   Actual Weight:  90.3 kg (199 lb)  Dosing BW:  90.3 kg       Assessment:   Target levels for this patient:  1.  AUC (mcg*h/mL):  400-600  2.   trough 10-15    Plan:   1. begin vancomycin 1250 mg IV q12 hours  2. Next scheduled level(s): at steady state  3. Pharmacy will continue to monitor and adjust therapy as needed.    Nettie Elm, [facility]  07/26/2018

## 2018-07-26 NOTE — Progress Notes
-  Pt is alert and orientedx4. MAE, FC. Afebrile. PERRL. C/o pain at incision site, PRN pain meds given.   -V paced at 60. MAP 70-80, titrating cardene. CI 2.25. PAPs 40s-20s. CVP 8-13. Pulses palpable.   -Lung sounds clear/dim. RA.   -Tolerating clear liquids.   -Adequate UOP.   -CT output 10-75ml/hr.     Gtts:  Epi 0.37mcg  Cardene 5mg   NS 70ml

## 2018-07-26 NOTE — Progress Notes
Critical Care Progress Note          Today's Date:  07/26/2018  Name:  Joel Hernandez                       MRN:  [id number]   Admission Date: 07/21/2018  LOS: 5 days                     Assessment/Plan:   Principal Problem:    S/P AVR (aortic valve replacement) and aortoplasty  Active Problems:    Hypertension    Obstructive sleep apnea    Stenosis of lateral recess of lumbar spine    Thrombocytopenia (HCC)    HLD (hyperlipidemia)    Acute on chronic diastolic CHF (congestive heart failure), NYHA class 2 (HCC)    Hypokalemia    S/p bare metal coronary artery stent    Intramural hematoma of thoracic aorta (HCC)    Pseudoaneurysm of aorta (HCC)    Complete heart block (HCC)    Junctional rhythm    Respiratory acidosis    Vasogenic shock (HCC)    Lactic acidosis    On mechanical ventilation    Vasogenic shock    Hypothyroidism    78 year old male significant past medical history for coronary disease status post PCI on 04/2018, aortic stenosis, hypertension, hyperlipidemia, OSA, hypothyroidism and BPH.  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  Patient underwent AVR and root replacement on 6/24.  He was admitted to CTICU for post operative management.    Neuro: Neuro intact, pain well controlled.    Cardiac: Cardiogenic shock resolving, attempted wean of inotropes today and had low CI and mixed venous without epi. TTE with low TAPSE and dilated IVC despite normal CVP, so will start low dose dobutamine with slow wean and will follow end organ function, ok to dc PAC this evening. . Will obtain formal TTE this AM, wean epi as able.   Pulmonary: Doing well on NC. No acute concerns.    FEN: ADAT. Bowel regimen.  ID:  Concern for endocarditis on pathology. Blood cultures sent this morning and ID consulted with initiation of antibiotics. Additional work up per their recommendations.  Renal:  AKI on re-admission to CTICU continues to improve. Heme: Stable. No coumadin per CTS. Plavix for prior DES.  Endo:  Modified Yale Insulin Protocol. Continue PTA methimazole.  Prophylaxis: HOB>40, H2 blocker, DVT prophylaxis per primary team  Disposition/Family:  Needs ICU for cardiogic shock  __________________________________________________________________________________    Prophylaxis Review:  Lines:  Yes; Arterial Line; Indication:  Frequent blood draws and Continuous BP monitoring; Location:  Radial  Central Line; Indication:  Hemodynamic monitoring; Type:  Internal jugular/PA  Urinary Catheter:  Yes; Retain foley due to:  Need for accurate Intake and Output  Antibiotic Usage: Surgical prophylaxis  VTE: SCDs      Date of Service: 07/26/18    I saw this patient today on multidisciplinary rounds in conjunction with the CTICU team and bedside nurse. This patient is critically ill due to aortic valve replacement c/b aortic bleed requiring reoperation, now with cardiogenic shock and with dysfunction of at least one organ system.  I provided and personally directed critical care services including Invasive Hemodynamic Management, Management of Cardiac Support Infusions, Management of Cardiogenic Shock and Complex Fluid & Electrolyte Management and the patient is at risk for life threatening deterioration necessitating complex medical decision making and ongoing provision of ICU  care    Critical Care Time: 44 minutes  Bard Herbert, MD  Assistant Professor  Anesthesiology and Critical Care

## 2018-07-27 ENCOUNTER — Encounter: Admit: 2018-07-27 | Discharge: 2018-07-27

## 2018-07-27 ENCOUNTER — Encounter: Admit: 2018-07-26 | Discharge: 2018-07-26

## 2018-07-27 DIAGNOSIS — I724 Aneurysm of artery of lower extremity: Secondary | ICD-10-CM

## 2018-07-27 DIAGNOSIS — I4891 Unspecified atrial fibrillation: Secondary | ICD-10-CM

## 2018-07-27 DIAGNOSIS — E079 Disorder of thyroid, unspecified: Secondary | ICD-10-CM

## 2018-07-27 DIAGNOSIS — R011 Cardiac murmur, unspecified: Secondary | ICD-10-CM

## 2018-07-27 DIAGNOSIS — M549 Dorsalgia, unspecified: Secondary | ICD-10-CM

## 2018-07-27 DIAGNOSIS — I350 Nonrheumatic aortic (valve) stenosis: Secondary | ICD-10-CM

## 2018-07-27 DIAGNOSIS — G4730 Sleep apnea, unspecified: Secondary | ICD-10-CM

## 2018-07-27 DIAGNOSIS — G4733 Obstructive sleep apnea (adult) (pediatric): Secondary | ICD-10-CM

## 2018-07-27 DIAGNOSIS — I214 Non-ST elevation (NSTEMI) myocardial infarction: Secondary | ICD-10-CM

## 2018-07-27 DIAGNOSIS — I2510 Atherosclerotic heart disease of native coronary artery without angina pectoris: Secondary | ICD-10-CM

## 2018-07-27 DIAGNOSIS — E785 Hyperlipidemia, unspecified: Secondary | ICD-10-CM

## 2018-07-27 DIAGNOSIS — I10 Essential (primary) hypertension: Secondary | ICD-10-CM

## 2018-07-27 DIAGNOSIS — C44621 Squamous cell carcinoma of skin of unspecified upper limb, including shoulder: Secondary | ICD-10-CM

## 2018-07-27 DIAGNOSIS — C4490 Unspecified malignant neoplasm of skin, unspecified: Secondary | ICD-10-CM

## 2018-07-27 LAB — GRAM STAIN

## 2018-07-27 LAB — BASIC METABOLIC PANEL
Lab: 0.9 mg/dL (ref 0.4–1.24)
Lab: 103 MMOL/L (ref 98–110)
Lab: 133 MMOL/L — ABNORMAL LOW (ref 137–147)
Lab: 22 MMOL/L (ref 21–30)
Lab: 27 mg/dL — ABNORMAL HIGH (ref 7–25)
Lab: 8.1 mg/dL — ABNORMAL LOW (ref 8.5–10.6)
Lab: 89 mg/dL (ref 70–100)
Lab: >60 mL/min (ref >60)
Lab: 8 (ref 3–12)
Lab: 135 MMOL/L — ABNORMAL LOW (ref 137–147)

## 2018-07-27 LAB — BARTONELLA AB PANEL IGG IGM

## 2018-07-27 LAB — O2 SATURATION, MIXED VENOUS: Lab: 55 % (ref 3.5–5.1)

## 2018-07-27 LAB — CHLAMYDIA AB IGG/IGM

## 2018-07-27 LAB — POC GLUCOSE
Lab: 70 mg/dL (ref 70–100)
Lab: 117 mg/dL — ABNORMAL HIGH (ref 70–100)
Lab: 102 mg/dL — ABNORMAL HIGH (ref 70–100)

## 2018-07-27 LAB — MAGNESIUM
Lab: 1.9 mg/dL (ref 1.6–2.6)
Lab: 2.3 mg/dL — ABNORMAL LOW (ref >60)

## 2018-07-27 LAB — Q FEVER(COXIELLA BURNETII)AB

## 2018-07-27 LAB — CBC: Lab: 14 K/UL — ABNORMAL HIGH (ref 4.5–11.0)

## 2018-07-27 MED ORDER — FUROSEMIDE 10 MG/ML IJ SOLN
20 mg | Freq: Once | INTRAVENOUS | 0 refills | Status: CP
Start: 2018-07-27 — End: ?
  Administered 2018-07-27: 11:00:00 20 mg via INTRAVENOUS

## 2018-07-27 MED ORDER — OXYCODONE 5 MG PO TAB
5 mg | ORAL | 0 refills | Status: DC | PRN
Start: 2018-07-27 — End: 2018-08-05
  Administered 2018-07-27 – 2018-08-04 (×18): 5 mg via ORAL

## 2018-07-27 MED ORDER — MAGNESIUM SULFATE IN D5W 1 GRAM/100 ML IV PGBK
1 g | Freq: Once | INTRAVENOUS | 0 refills | Status: CP
Start: 2018-07-27 — End: ?
  Administered 2018-07-27: 08:00:00 1 g via INTRAVENOUS

## 2018-07-27 NOTE — Progress Notes
OCCUPATIONAL THERAPY  PROGRESS NOTE      Name: Joel Hernandez        MRN: [id number]          DOB: 05-14-1940          Age: 78 y.o.  Admission Date: 07/21/2018             LOS: 6 days    Mobility  Patient Turn/Position: Chair  Progressive Mobility Level: Walk in hallway  Distance Walked (feet): 40 ft  Level of Assistance: Assist X2  Assistive Device: Walker  Time Tolerated: 11-30 minutes  Activity Limited By: Fatigue;Weakness;Patient request to stop    Subjective  Pertinent Dx per Physician: PMH: coronary disease status post PCI on 04/2018, aortic stenosis, hypertension, hyperlipidemia, OSA, hypothyroidism and BPH.  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  Patient underwent AVR and root replacement on 6/24.  He was admitted to CTICU for post operative management. Return to OR 6/27 for hemotama evacuation  Precautions: Falls;Sternal Precautions  R LE Precautions: (h/o RLE foot drop- no longer wears AFO)  Pain / Complaints: Patient has no c/o pain;Patient agrees to participate in therapy    Objective  Psychosocial Status: Willing and Cooperative to Participate  Persons Present: Physical Therapist;Nursing Staff    Home Living  Type of Home: House  Home Layout: Two Level  Financial risk analyst / Tub: Tub/Shower Unit  Home Equipment: Cane    Prior Function  Level Of Independence: Independent with ADLs and functional transfers;Independent with homemaking w/ ambulation  Lives With: Alone  Receives Help From: None Needed  Leisure: Hobbies-Yes (comments)(hunting and fishing)  Other Function Comments: 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    ADL's  Where Assessed: (Toilet in room)  Toileting Assist: Maximum Assist  Toileting Deficits: Perineal Hygiene(gown management)  Comment: Participates in toileting with maximum assist for gown management and peri care following bowel movement- requires steadying assist while standing with ICU walker as well.    ADL Mobility Transfer Type: Sit to/from stand  Transfer: Assistance Level: To/from;Bedside chair;Toilet;Moderate assist;x2 people  Transfer: Assistive Device: (heart pillow)  Transfer: Type of Assistance: For balance;For safety considerations;For strength deficit;To maintain precautions  End of Activity Status: Up in chair  Standing Balance: Static standing balance;Minimal assist  Gait Distance: 40 feet  Gait: Assistance Level: Minimal assist;x2 people;Management of lines;Safety considerations  Gait: Assistive Device: Roller walker  Gait Comments: Requires verbal cueing to stand up straight and avoid slouching forward.    Activity Tolerance  Endurance: 3/5 Tolerates 25-30 Minutes Exercise w/Multiple Rests    Cognition  Overall Cognitive Status: WFL to Adequately Complete Self Care Tasks Safely    Assessment  Assessment: Decreased ADL Status;Decreased Safe/Judg during ADL;Decreased Endurance;Decreased Self-Care Trans;Decreased High-Level ADLs  Goal Formulation: Patient  Comments: Pt making daily steady gains with therapy. Remains weak with decreased activity tolerance due to recent surgery. Anticipate continued progress with therapy and recommend inpatient setting for further therapy at d/c.    AM-PAC 6 Clicks Daily Activity Inpatient  Putting on and taking off regular lower body clothes?: A Lot  Bathing (Including washing, rinsing, drying): A Little  Toileting, which includes using toilet, bedpan, or urinal: A Lot  Putting on and taking off regular upper body clothing: A Little  Taking care of personal grooming such as brushing teeth: None  Eating meals?: None  Daily Activity Raw Score: 18  Standardized (t-scale) score: 38.66  CMS 0-100% Score: 46.65  CMS G Code Modifier:  CK    Plan  OT Frequency: 5x/week  OT Plan for Next Visit: Grooming at sink; sock management    Further Evaluation Goals  Pt Will Tolerate Further ADL Evaluation: w/in1-2 sessions    ADL Goals  Patient Will Perform Grooming: Standing at Sink;w/ Stand By Assist Patient Will Perform LE Dressing: w/ Stand By Assist    Functional Transfer Goals  Pt Will Perform All Functional Transfers: w/ Stand By Assist    OT Discharge Recommendations  Recommendation: Inpatient setting  Patient Currently Requires Physical Assist With: All mobility;All home functioning ADLs;Bathing;Dressing;Toileting  Patient Currently Requires Equipment: Dan Humphreys with wheels    Therapist: Lauro Regulus, OTR/L [id number]  Date: 07/27/2018

## 2018-07-27 NOTE — Progress Notes
PHYSICAL THERAPY  PROGRESS NOTE        Name: Joel Hernandez        MRN: [id number]          DOB: 1940/12/16          Age: 78 y.o.  Admission Date: 07/21/2018             LOS: 6 days        Mobility  Patient Turn/Position: Chair  Progressive Mobility Level: Walk in room  Distance Walked (feet): 30 ft  Level of Assistance: Assist X2  Assistive Device: Hand Held  Time Tolerated: 0-10 minutes  Activity Limited By: Patient request to stop;Fatigue    Subjective  Significant hospital events:  s/p AVR & root replacement today 6/24, PPM 6/26 cardiac arrest 6/27, cardiogenic shock sas taken back to OR for tamponade 6/28  Mental / Cognitive Status: Lethargic;Cooperative  Persons Present: Nursing Staff;Occupational Therapist  Pain: Patient has no complaint of pain  Ambulation Assist: Independent Mobility in Community without Device  Patient Owned Equipment: Single [employer] Situation: Lives Alone  Type of Home: House  In-Home Stairs: 1-2 Flights of Stairs(to bedroom)      Strength  Overall Strength: Generalized Weakness  Strength Comment: Right foot drop at baseline     Bed Mobility/Transfer  Transfer Type: Sit to Stand  Transfer: Assistance Level: To/From;Bed Side Chair;Toilet;Moderate Assist;x2 People  Transfer: Assistive Device: Doctor, general practice  Transfers: Type Of Assistance: Verbal Cues;To Maintain Precautions;For Safety Considerations  End Of Activity Status: Up in Chair    Balance  Sitting Balance: Static Sitting Balance;No UE Support;Minimal Assist;x2 People  Standing Balance: Dynamic Standing Balance;2 UE support;Moderate Assist    Gait  Gait Distance: 30 feet  Gait: Assistance Level: Minimal Assist;x2 People;Management of Lines;Safety Considerations  Gait: Assistive Device: 4-Wheeled Walker  Gait: Descriptors: Decreased foot clearance RLE;Decreased foot clearance LLE;Forward trunk flexion;Pace: Slow;Decreased step length;No balance loss    Education  Persons Educated: Patient Patient Barriers To Learning: None Noted  Teaching Methods: Verbal Instruction  Patient Response: Verbalized Understanding  Topics: Plan/Goals of PT Interventions;Up with Assist Only;Importance of Increasing Activity    Assessment/Progress  Assessment/Progress: Should Improve w/ Continued PT    AM-PAC 6 Clicks Basic Mobility Inpatient  Turning from your back to your side while in a flat bed without using bed rails: A Little  Moving from lying on your back to sitting on the side of a flatbed without using bedrails : A Lot  Moving to and from a bed to a chair (including a wheelchair): A Lot  Standing up from a chair using your arms (e.g. wheelchair, or bedside chair): A Lot  To walk in hospital room: A Lot  Climbing 3-5 steps with a railing: A Lot  Raw Score: 13  Standardized (T-scale) Score: 33.99  Basic Mobility CMS 0-100%: 57.65  CMS G Code Modifier for Basic Mobility: CK    Goals  Goal Formulation: With Patient  Time For Goal Achievement: 7 days  Patient Will Go Supine To/From Sit: w/ Stand By Assist  Patient Will Transfer Sit to Stand: w/ Stand By Assist  Patient Will Ambulate: Greater than 200 Feet, w/ Dan Humphreys, w/ Stand By Assist    Plan  Treatment Interventions: Mobility Training;Balance Activities;Endurance Training  Plan Frequency: 5 Days per Week  PT Plan for Next Visit: work bed mobility and sit to stand transfers while maintaining sternal precautions and progress ambulation     PT Discharge Recommendations  Recommendation:  Inpatient setting    Therapist: Lynett Grimes, PT, DPT  Date: 07/27/2018

## 2018-07-27 NOTE — Progress Notes
Cardiothoracic Surgery Critical Care   Progress Note     Joel Hernandez  Today's Date:  07/27/2018  Admission Date: 07/21/2018  LOS: 6 days      Procedure: EXPLORATION POSTOPERATIVE WOUND - CHEST: 35820 (CPT???) -Hematoma evacuation-07/24/2018      Procedure: INSERTION/ REPLACEMENT PERMANENT PACEMAKER WITH ATRIAL AND VENTRICULAR LEAD: 33208 (CPT???)  FLUOROSCOPY - CARDIAC: 16109 (CPT???) 6/26    Procedure: Aortic Root replacement (Freestyle)/Implantation of coronary artery buttons, Circ arrest: 33405 (CPT???) 6/24        Principal Problem:    S/P AVR (aortic valve replacement) and aortoplasty  Active Problems:    Hypertension    Obstructive sleep apnea    Stenosis of lateral recess of lumbar spine    Thrombocytopenia (HCC)    HLD (hyperlipidemia)    Acute on chronic diastolic CHF (congestive heart failure), NYHA class 2 (HCC)    Hypokalemia    S/p bare metal coronary artery stent    Intramural hematoma of thoracic aorta (HCC)    Pseudoaneurysm of aorta (HCC)    Complete heart block (HCC)    Junctional rhythm    Respiratory acidosis    Vasogenic shock (HCC)    Lactic acidosis        Assessment/Plan:      Neuro ??? A&Ox4. Pain well controlled. Continue prn tylenol, prn lidoderm patches & low dose PRN oxy.   CV ??? PPM in place, V paced 60. MAP goal > 65 (pulse pressure wide). Epi weaned off 6/29.Joel Hernandez CI later sagged & dobutamine 3 mcg/kg/min was started. CI > 2. Plan to wean dobutamine to 2 mcg/kg/min & slowly off after. Dc swan this am. Continue bASA, atorvastatin. Hold BB while on dobutamine. TTE 6/29: LVEF 75% & hyperdynamic, mild reduce in RV function, grade 3 diastolic dysfunction, mild TR, mild to moderate MR.   Resp ??? Extubated 6/28. Cxr: bibasilar pleural effusions and atelectasis, increase in pulmonary venous congestion. Will review radiology report. Continue IS, aggressive pulm toilet. Chest tubes in place - maintain today per Dr. Helen Hashimoto.   Renal ??? Serum creatinine 1.01-->0.89. UOP ~40-50 mL/hr. Net +1.6 L/24 hrs. Give lasix 20 mg IV this am. May need to repeat lasix later today. Goal net negative 0.5-1L/24 hrs. Monitor BMP, assess for AKI. Continue pta flomax.   GI - Continue regular diet. LBM 6/30. Pt has been constipated -- continue aggressive bowel regimen.   ID ??? Afebrile. WBC 21-->14. 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 ID following - appreciate assistance. Pan cx's sent 6/29 - NGTD, continue to follow. Continue doxycycline, vancomycin and unasyn.   Heme-Hgb 11-->10, platelets 96-->73. Continue to monitor acute blood loss anemia and thrombocytopenia. Continue Plavix for history of DES. Foot pumps for DVT prophylaxis. Subcu heparin POD 3 per CTS.   FEN ??? If creatinine < 2.0, replace Mg and K to minimize cardiac arrhythmias. Hgb A1c 5.2%.MDCF SSI PRN.   Activity ???  Up to chair this am. Early cardiac PT/OT.     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 Wean dobutamine slowly. Lasix 20  mg IV this am. Transfer to floor.     I have seen, personally fully evaluated, and discussed patient with the critical care attending and cardiothoracic surgeon. The patient is critically ill, I spent 32 minutes (excluding time spent performing procedures) providing and personally directing critical care services including hemodynamic monitoring and management, lab and radiology review, medication review and management, fluid and electrolyte management and coordination of care.     Bland Span, APRN-NP   CTS Intensive Care  Pager [phone]  07/27/2018 Subjective:       HPI:   Joel Hernandez is a 78 y.o. male with pmhx of AS, CAD s/p PCI 04/2018, HTN, HLD, sleep apnea (doesn't need cpap after weight loss), former smoker, hyperthyroid, BPH. 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 His eliquis was dc'ed and he was started on plavix. Now s/p AVR & root replacement today 6/24 with Dr. Helen Hashimoto. He was brought to the ICU following the procedure.???  ???  POD 1:  CHB  POD 2:  Dual chamber PPM, CTs discontinued  POD 3:  Tele status, start Metop. Patient transferred to CTP. Patient coded presumable vasovagal while in the restroom. Patient assisted to floor and received 1 round of chest compressions before achieving ROSC. Patient transferred to CTI where he was initially stable. Patient quickly declined hemodynamically requiring emergent intubation, central line and arterial line placedment. Pacer evaluation with proper pacer function. TTE with moderate amount of fluid around heart with decreased function. Patient emergently taken to CVOR for mediastinal exploration. Patient currently recovering postoperatively in CTI.   6/30: dc swan, slow wean dobutamine, lasix 20 IV this am. Dc swan. Continue empiric coverage for endocarditis. Transfer to floor.       REVIEW OF SYSTEMS:   Review of Systems   Constitutional: Negative for chills, fever and malaise/fatigue.   Respiratory: Negative for cough, hemoptysis and sputum production.    Cardiovascular: Positive for chest pain and leg swelling. Negative for orthopnea and claudication.   Gastrointestinal: Negative for blood in stool, heartburn, nausea and vomiting.   Genitourinary: Negative for frequency and hematuria.   Skin: Negative for itching and rash.   Neurological: Negative for dizziness, speech change, weakness and headaches.   Psychiatric/Behavioral: Negative for hallucinations. The patient has insomnia. The patient is not nervous/anxious.        Objective:        Medications:  Scheduled Meds:ampicillin/sulbactam (UNASYN) 3 g in sodium chloride 0.9% (NS) 100 mL IVPB (MB+), 3 g, Intravenous, Q6H*  aspirin chewable tablet 81 mg, 81 mg, Oral, QDAY  atorvastatin (LIPITOR) tablet 40 mg, 40 mg, Oral, QHS  clopiDOGrel (PLAVIX) tablet 75 mg, 75 mg, Oral, QDAY  docusate (COLACE) capsule 100 mg, 100 mg, Oral, BID  doxycycline (VIBRAMYCIN) tablet 100 mg, 100 mg, Oral, BID  insulin aspart U-100 (NOVOLOG FLEXPEN) injection PEN 0-12 Units, 0-12 Units, Subcutaneous, ACHS (22)  lidocaine (LIDODERM) 5 % topical patch 1-2 patch, 1-2 patch, Topical, QDAY    And  Verification of Patch Placement and Integrity - Lidocaine 5%, , Transdermal, BID  methIMAzole (TAPAZOLE) tablet 10 mg, 10 mg, Oral, QDAY    And  methIMAzole (TAPAZOLE) tablet 15 mg, 15 mg, Oral, QDAY  pneumococcal 23-val vaccine (PPSV23) (PNEUMOVAX 23) injection 0.5 mL, 0.5 mL, Intramuscular, ONCE  polyethylene glycol 3350 (MIRALAX) packet 17 g, 1 packet, Oral, BID  senna/docusate (SENOKOT-S) tablet 2 tablet, 2 tablet,  Oral, BID  tamsulosin (FLOMAX) capsule 0.4 mg, 0.4 mg, Oral, QHS  vancomycin (VANCOCIN) 1,250 mg in sodium chloride 0.9% (NS) IVPB, 15 mg/kg, Intravenous, Q12H*    Continuous Infusions:  ??? DOBUTamine (DOBUTREX) 1 g/D5W 250 mL infusion (std conc)(premade) 2 mcg/kg/min (07/27/18 1015)     PRN and Respiratory Meds:acetaminophen Q6H PRN **OR** acetaminophen Q6H PRN, alum/mag hydroxide/simeth Q4H PRN, bisacodyL QDAY PRN, hydrALAZINE Q6H PRN, milk of magnesia (CONC) QDAY PRN, ondansetron Q6H PRN **OR** ondansetron (ZOFRAN) IV Q6H PRN, oxyCODONE Q4H PRN, potassium chloride SR PRN **OR** potassium chloride PRN **OR** potassium chloride in water PRN, vancomycin, pharmacy to manage Per Pharmacy                       Vital Signs: Last Filed                  Vital Signs: 24 Hour Range   BP: 127/55 (06/29 1300)  Temp: 37.1 ???C (98.8 ???F) (06/30 0900) Pulse: 61 (06/30 0900)  Respirations: 19 PER MINUTE (06/30 0900)  SpO2: 100 % (06/30 0900)  SpO2 Pulse: 61 (06/30 0900) BP: (122-127)/(53-55)   ABP: (115-172)/(38-78)   Temp:  [37 ???C (98.6 ???F)-37.5 ???C (99.5 ???F)]   Pulse:  [60-62]   Respirations:  [9 PER MINUTE-22 PER MINUTE]   SpO2:  [93 %-100 %]    Intensity Pain Scale (Self Report): 4 (07/26/18 1752) Vitals:    07/21/18 0626 07/24/18 2225 07/26/18 0800   Weight: 90.7 kg (199 lb 15.3 oz) 90.3 kg (199 lb) 90.3 kg (199 lb)           Intake/Output Summary:  (Last 24 hours)    Intake/Output Summary (Last 24 hours) at 07/27/2018 1052  Last data filed at 07/27/2018 0900  Gross per 24 hour   Intake 2676.6 ml   Output 2715 ml   Net -38.4 ml         Physical Exam:         Neuro: Alert and orientedx4, MAE   Cardiovascular: RRR no rub or murmur  Respiratory: LS CTA bil - diminished in the bases  GI: soft, NT, hypoactive BS  Extremities: 1+ peripheral edema  Incisions: Sternal incision dressing, dry and intact. No crepitus or sternal instability.    Laboratory:  LABS:  Recent Labs     07/24/18  2100 07/25/18  0030 07/25/18  0437 07/25/18  1415 07/26/18  0300 07/26/18  2019 07/27/18  0347   NA 135* 142 140 140 136* 133* 135*   K 4.0 2.9* 4.8 4.7 4.2 4.4 4.4   CL 102 106 107 110 107 103 104   CO2 21 20* 23 21 22 22 24    GAP 12 16* 10 9 7 8 7    BUN 32* 31* 32* 37* 32* 27* 27*   CR 1.38* 1.32* 1.14 1.32* 1.01 0.90 0.89   GLU 184* 172* 115* 136* 119* 89 88   CA 8.4* 11.6* 10.0 9.5 8.8 8.1* 8.0*   ALBUMIN 3.3* 2.4*  --   --   --   --   --    MG  --   --  1.9  --  1.8 1.9 2.3       Recent Labs     07/24/18  2100 07/25/18  0030 07/25/18  0437 07/25/18  1415 07/25/18  1755 07/26/18  0300 07/27/18  0347   WBC 16.0* 22.3* 24.5* 19.9* 22.4* 21.3* 14.6*   HGB 10.6* 12.8* 13.3* 12.5* 11.1*  11.1* 10.1*   HCT 31.6* 38.5* 38.6* 36.4* 32.7* 32.5* 29.8*   PLTCT 196 77* 88* 123* 117* 96* 73*   INR 1.2 1.4*  --   --   --   --   --    PTT 24.4 30.8  --   --   --   --   -- AST 46* 46*  --   --   --   --   --    ALT 30 26  --   --   --   --   --    ALKPHOS 139* 96  --   --   --   --   --    TNI 5.61*  --   --   --   --   --   --       Estimated Creatinine Clearance: 88.8 mL/min (based on SCr of 0.89 mg/dL).  Vitals:    07/21/18 0626 07/24/18 2225 07/26/18 0800   Weight: 90.7 kg (199 lb 15.3 oz) 90.3 kg (199 lb) 90.3 kg (199 lb)      Recent Labs     07/25/18  0330 07/25/18  1100   PHART 7.33* 7.41   PO2ART 107* 95           Radiology and Other Diagnostic Procedures Review:    Reviewed

## 2018-07-27 NOTE — Progress Notes
0800 Assumed care of patient. Assessment completed. Pt A/Ox4. PERRL. MAE. No c/o pain at this time. VSS. HR paced per PPM. MAP maintained >70 on no vasoactive gtts. Currently on Dobutamine 3. Pulses all present and palpable. Nonpitting edema present in BUE and BLE. Lungs sounds clear/diminished on 1L NC. UOP per foley. BM 6/27. Tolerating regular diet. Wounds CDI. Med CT  X2 -20 CO ~20-45mL/hr SS output. Pleural X1 -20 CO ~0-51mL/hr SS output. Current gtts: Dobutamine and NS.     07/27/18 0831   Critical Care Vitals Adult   CVP (!) 9 MM HG   Hemodynamic Monitoring   CO 5.12   CI (!) 2.38   SV (Calculated) 84   SVI (Calculated) 39   SVR (Calculated) 984   SVRI (Calculated) 2115   PA Pressure (!) 33/15   PA Mean (mm Hg) (!) 19 mm Hg   PAOP (!) 15 MM HG   PVR (Calculated) 62   PVRI (Calculated) (!) 134   LVSWI (Calculated) (!) 30   RVSWI (Calculated) 5

## 2018-07-27 NOTE — Progress Notes
1200 Reassessment completed. Pt A/Ox4. PERRL. MAE. No c/o pain at this time. VSS. HR 60 paced per PPM. MAP maintained >70 on no vasoactive gtts. Currently on Dobutamine 2. Pulses all present and palpable. Nonpitting edema present in BUE and BLE. Lungs sounds clear/diminished on 1L NC. UOP per foley. BM 6/30. Tolerating regular diet. Wounds CDI. Med CT  X2 -20 CO ~20-9mL/hr SS output. Pleural X1 -20 CO ~0-58mL/hr SS output. Current gtts: Dobutamine and NS.

## 2018-07-28 DIAGNOSIS — I350 Nonrheumatic aortic (valve) stenosis: Principal | ICD-10-CM

## 2018-07-28 LAB — MAGNESIUM
Lab: 2 mg/dL (ref 1.6–2.6)
Lab: 2 mg/dL — ABNORMAL LOW (ref 1.6–2.6)

## 2018-07-28 LAB — VANCOMYCIN TROUGH: Lab: 10 ug/mL (ref 10.0–20.0)

## 2018-07-28 LAB — CBC
Lab: 12 K/UL — ABNORMAL HIGH (ref >60)
Lab: 32 % — ABNORMAL LOW (ref 40–50)

## 2018-07-28 LAB — POC GLUCOSE
Lab: 94 mg/dL (ref 70–100)
Lab: 89 mg/dL (ref 70–100)
Lab: 147 mg/dL — ABNORMAL HIGH (ref 70–100)
Lab: 90 mg/dL (ref 70–100)

## 2018-07-28 LAB — CULTURE-RESP,LOWER W/SENSITIVITY: Lab: LOW — AB

## 2018-07-28 LAB — BASIC METABOLIC PANEL: Lab: 138 MMOL/L — ABNORMAL LOW (ref >60)

## 2018-07-28 LAB — VANCOMYCIN 2HR POST DOSE: Lab: 18 ug/mL

## 2018-07-28 LAB — POTASSIUM: Lab: 4.6 MMOL/L (ref 3.5–5.1)

## 2018-07-28 LAB — BRUCELLA ANTIBODY IGG IGM: Lab: NEGATIVE

## 2018-07-28 MED ORDER — POTASSIUM CHLORIDE 10 MEQ PO TBTQ
10 meq | Freq: Two times a day (BID) | ORAL | 0 refills | Status: DC
Start: 2018-07-28 — End: 2018-08-05
  Administered 2018-07-28 – 2018-08-05 (×17): 10 meq via ORAL

## 2018-07-28 MED ORDER — HEPARIN, PORCINE (PF) 5,000 UNIT/0.5 ML IJ SYRG
5000 [IU] | SUBCUTANEOUS | 0 refills | Status: DC
Start: 2018-07-28 — End: 2018-08-05
  Administered 2018-07-28 – 2018-08-05 (×24): 5000 [IU] via SUBCUTANEOUS

## 2018-07-28 MED ORDER — VANCOMYCIN 1,750 MG IVPB
1750 mg | Freq: Two times a day (BID) | INTRAVENOUS | 0 refills | Status: DC
Start: 2018-07-28 — End: 2018-07-30
  Administered 2018-07-28 – 2018-07-30 (×8): 1750 mg via INTRAVENOUS

## 2018-07-28 MED ORDER — FUROSEMIDE 10 MG/ML IJ SOLN
20 mg | Freq: Once | INTRAVENOUS | 0 refills | Status: CP
Start: 2018-07-28 — End: ?
  Administered 2018-07-28: 13:00:00 20 mg via INTRAVENOUS

## 2018-07-28 MED ORDER — FUROSEMIDE 10 MG/ML IJ SOLN
40 mg | Freq: Two times a day (BID) | INTRAVENOUS | 0 refills | Status: DC
Start: 2018-07-28 — End: 2018-07-30
  Administered 2018-07-28: 14:00:00 20 mg via INTRAVENOUS
  Administered 2018-07-28 – 2018-07-30 (×4): 40 mg via INTRAVENOUS

## 2018-07-28 NOTE — Discharge Instructions
Spray incisions with provided wound cleanser spray twice daily as instructed.

## 2018-07-28 NOTE — Progress Notes
OCCUPATIONAL THERAPY  PROGRESS NOTE      Name: Joel Hernandez        MRN: [id number]          DOB: 1940-02-17          Age: 78 y.o.  Admission Date: 07/21/2018             LOS: 7 days      Mobility  Patient Turn/Position: Weight shifted (Chair)  Progressive Mobility Level: Walk in hallway  Distance Walked (feet): 80 ft  Level of Assistance: Assist X2  Assistive Device: Walker  Time Tolerated: 11-30 minutes  Activity Limited By: Fatigue;Weakness(breakfast arrived)    Subjective  Pertinent Dx per Physician: PMH: coronary disease status post PCI on 04/2018, aortic stenosis, hypertension, hyperlipidemia, OSA, hypothyroidism and BPH.  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 s/p AVR and root replacement 6/24. Admitted to CTICU for post operative management. Return to OR 6/27 for hemotama evacuation  Precautions: Falls;Sternal Precautions(two chest tubes (ok to be on waterseal for ambulation))  Pain / Complaints: Patient agrees to participate in therapy  Pain Location: Incisional  Pain Level Current: 5    Objective  Psychosocial Status: Willing and Cooperative to Participate  Persons Present: RehabTechnician    Home Living  Type of Home: House  Home Layout: Two Level  Financial risk analyst / Tub: Tub/Shower Unit  Home Equipment: Cane;Walker    Prior Function  Level Of Independence: Independent with ADLs and functional transfers;Independent with homemaking w/ ambulation  Lives With: Alone  Receives Help From: None Needed  Leisure: Hobbies-Yes (comments)(hunting and fishing)  Other Function Comments: 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    ADL's  Where Assessed: Chair  Eating Assist: Stand By Assist  Eating Deficits: Setup  LE Dressing Assist: Moderate Assist  LE Dressing Deficits: Don/Doff R Sock(Pt able to don L sock, crossover technique seated)    ADL Mobility  Transfer Type: Sit to/from stand  Transfer: Assistance Level: To/from;Bedside chair;Minimal assist;x2 people Transfer: Assistive Device: Agricultural consultant: Type of Assistance: Verbal cues;For safety considerations;For strength deficit;To maintain precautions  End of Activity Status: Up in chair;Instructed patient to request assist with mobility;Instructed patient to use call light;Nursing notified  Transfer Comments: Pt required cues during transfer to maintain sternal precautions.   Gait Distance: 80 feet  Gait: Assistance Level: Minimal assist  Gait: Assistive Device: Roller walker    Activity Tolerance  Endurance: 2/5 Tolerates 10-20 Minutes Exercise w/Multiple Rests    Cognition  Overall Cognitive Status: WFL to Adequately Complete Self Care Tasks Safely  Attention: Awake/Alert    Education  Goal Formulation: With Patient    Assessment  Assessment: Decreased ADL Status;Decreased Safe/Judg during ADL;Decreased Endurance;Decreased Self-Care Trans;Decreased High-Level ADLs  Prognosis: Good;w/Cont OT s/p Acute Discharge  Goal Formulation: Patient  Comments: Patient progressing with therapy. Continues to be limited by weakness and decreased activity tolerance related to recent surgeries. Anticipate patient will continue to progress with therapy and recommend inpatient setting for continued therapy at discharge.    AM-PAC 6 Clicks Daily Activity Inpatient  Putting on and taking off regular lower body clothes?: A Lot  Bathing (Including washing, rinsing, drying): A Little  Toileting, which includes using toilet, bedpan, or urinal: A Lot  Putting on and taking off regular upper body clothing: A Little  Taking care of personal grooming such as brushing teeth: None  Eating meals?: None  Daily Activity Raw Score: 18  Standardized (t-scale) score: 38.66  CMS 0-100% Score: 46.65  CMS G Code Modifier: CK    Plan  OT Frequency: 5x/week  OT Plan for Next Visit: grooming routine standing at sink    Further Evaluation Goals  Pt Will Tolerate Further ADL Evaluation: w/in1-2 sessions    ADL Goals Patient Will Perform Grooming: Standing at Sink;w/ Stand By Assist  Patient Will Perform LE Dressing: w/ Stand By Assist    Functional Transfer Goals  Pt Will Perform All Functional Transfers: w/ Stand By Assist    OT Discharge Recommendations  Recommendation: Inpatient setting  Patient Currently Requires Physical Assist With: All mobility;All home functioning ADLs;Bathing;Dressing;Toileting  Patient Currently Requires Equipment: Owns what is needed    Therapist: Mariana Single, OTR/L [id number]  Date: 07/28/2018

## 2018-07-28 NOTE — Case Management (ED)
Case Management Progress Note    NAME:Joel Hernandez                          MRN: [id number]              DOB:1940/06/04          AGE: 78 y.o.  ADMISSION DATE: 07/21/2018             DAYS ADMITTED: LOS: 7 days      Today???s Date: 07/28/2018    Plan  Dc planning for possible inpatient setting once stable for same.      Per CTS team huddle this am, pt is continuing to improve.  To receive PO lasix BID.  Per Dr. Helen Hashimoto, pt is still significantly volume overloaded.  To have CT's remain in place for now.  Team to wean dobutamine off slowly.  Anticipate pt can dc Sat/Sun if he remains stable.    Interventions  ? Support   Support: Pt/Family Updates re:POC or DC Plan, Patient Education  ? Info or Referral   Information or Referral to Community Resources: No Needs Identified  ? Discharge Planning   Discharge Planning: Inpatient Rehabilitation     SW reached out to OT this morning and verified if they feel comfortable with PMR consult for pt.  They confirmed same.    SW reached out to team and requested for PMR consult to be placed to determine next steps.      SW will await PMR recommendations in order to know how to best proceed with dc planning for pt.    ? Medication Needs   Medication Needs: No Needs Identified  o Financial   Financial: No Needs Identified  ? Legal   Legal: No Needs Identified  ? Other   Other/None: No needs identified    Disposition  ? Expected Discharge Date    Expected Discharge Date: 08/01/18  Expected Discharge Time: 1400  ? Transportation   Does the patient need discharge transport arranged?: No  Transportation Name, Phone and Availability #1: Son - Ehren Berisha - [phone]  Does the patient use Medicaid Transportation?: No  ? Next Level of Care (Acute Psych discharges only)      ? Discharge Disposition      Durable Medical Equipment      No service has been selected for the patient.      [place] [facility]      No service has been selected for the patient.      [facility] No service has been selected for the patient.      [facility]      No service has been selected for the patient.        Gweneth Dimitri, LMSW  Surgery - Cardiothoracic/Vascular  Social Work Case Manager  [phone]

## 2018-07-28 NOTE — Progress Notes
Patient arrived to room # [id number]) via wheelchair accompanied by RN. Patient transferred to the bed with assistance. Bedside safety checks completed. Initial patient assessment completed. Refer to flowsheet for details.    Admission skin assessment completed with: Raquel Sarna, RN    Pressure injury present on arrival?: No    1. Head/Face/Neck: No  2. Trunk/Back: No  3. Upper Extremities: No  4. Lower Extremities: No  5. Pelvic/Coccyx: No  6. Assessed for device associated injury? Yes  7. Malnutrition Screening Tool (Nursing Nutrition Assessment) Completed? Yes    See Doc Flowsheet for additional wound details.     INTERVENTIONS: NA

## 2018-07-28 NOTE — Progress Notes
Pharmacy Vancomycin Note  Subjective:   Joel Hernandez is a 78 y.o. 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    Objective:     Current Vancomycin Orders   Medication Dose Route Frequency    vancomycin (VANCOCIN) 1,750 mg in sodium chloride 0.9% (NS) IVPB  1,750 mg Intravenous Q12H*    vancomycin, pharmacy to manage  1 each Service Per Pharmacy     Start Date of  vancomycin therapy: 07/26/2018    White Blood Cells   Date/Time Value Ref Range Status   07/28/2018 0510 12.4 (H) 4.5 - 11.0 K/UL Final   07/27/2018 0347 14.6 (H) 4.5 - 11.0 K/UL Final   07/26/2018 0300 21.3 (H) 4.5 - 11.0 K/UL Final   07/25/2018 1755 22.4 (H) 4.5 - 11.0 K/UL Final     Creatinine   Date/Time Value Ref Range Status   07/28/2018 0510 0.84 0.4 - 1.24 MG/DL Final   07/27/2018 0347 0.89 0.4 - 1.24 MG/DL Final   07/26/2018 2019 0.90 0.4 - 1.24 MG/DL Final     Blood Urea Nitrogen   Date/Time Value Ref Range Status   07/28/2018 0510 22 7 - 25 MG/DL Final     Actual Weight:  103.2 kg (227 lb 9.6 oz)  Dosing BW:  90.3 kg   Drug Levels:  Vancomycin 2HR POST Dose   Date/Time Value Ref Range Status   07/28/2018 0510 18.2 ug/mL Final     Vancomycin Trough   Date/Time Value Ref Range Status   07/28/2018 0100 10.2 10.0 - 20.0 MCG/ML Final       Calculations:  Calculated True Peak (mcg/mL): 21 mcg/mL  Calculated Trough (mcg/mL):  8.8 mcg/mL  Rate of elimination (h-1):  0.07  Half Life (hr):  9.38 hours  Volume of distribution (L/kg): 0.93 L/kg  AUC (mcg*h/mL): 352 mcg*h/mL    Assessment:   Target levels for this patient:  1.  AUC (mcg*h/mL):  400-600  2.   trough 10-15    Evaluation of AUC and/or level(s): levels drawn appropriately and used for kinetic calculations    Plan:   1. increase vancomycin to 1750 mg q 12 hours  2. Next scheduled level(s): at steady state  3. Pharmacy will continue to monitor and adjust therapy as needed.    Nettie Elm, [facility]  07/28/2018

## 2018-07-28 NOTE — Progress Notes
PHYSICAL THERAPY  PROGRESS NOTE        Name: Joel Hernandez        MRN: [id number]          DOB: Sep 02, 1940          Age: 78 y.o.  Admission Date: 07/21/2018             LOS: 7 days        Mobility  Patient Turn/Position: Chair  Progressive Mobility Level: Walk in hallway  Distance Walked (feet): 70 ft  Level of Assistance: Assist X1  Assistive Device: Walker  Time Tolerated: 11-30 minutes  Activity Limited By: Fatigue;Weakness;Patient request to stop    Subjective  Significant hospital events:  s/p AVR & root replacement today 6/24, PPM 6/26 cardiac arrest 6/27, cardiogenic shock sas taken back to OR for tamponade 6/28  Mental / Cognitive Status: Lethargic;Cooperative  Persons Present: Physical Therapist;Nursing Staff  Pain: Patient has no complaint of pain  R LE Precautions: (h/o RLE foot drop- no longer wears AFO)  Ambulation Assist: Independent Mobility in [employer] without Device  Patient Owned Equipment: Single [employer] Situation: Lives Alone  Type of Home: House  In-Home Stairs: 1-2 Flights of Stairs(to bedroom)    Strength  Overall Strength: Generalized Weakness  Strength Comment: Right foot drop at baseline     Bed Mobility/Transfer  Transfer Type: Sit to Stand  Transfer: Assistance Level: To/From;Bed Side Chair;Moderate Assist;x2 People  Transfer: Assistive Device: Doctor, general practice  Transfers: Type Of Assistance: Verbal Cues;To Maintain Precautions;For Safety Considerations  End Of Activity Status: Up in Chair    Balance  Sitting Balance: Static Sitting Balance;No UE Support;Minimal Assist;x2 People  Standing Balance: Dynamic Standing Balance;2 UE support;Moderate Assist    Gait  Gait Distance: 70 feet  Gait: Assistance Level: Minimal Assist;Safety Considerations  Gait: Assistive Device: 4-Wheeled Walker  Gait: Descriptors: Decreased foot clearance RLE;Decreased foot clearance LLE;Forward trunk flexion;Pace: Slow;Decreased step length;No balance loss    Education  Persons Educated: Patient Patient Barriers To Learning: None Noted  Teaching Methods: Verbal Instruction  Patient Response: Verbalized Understanding  Topics: Plan/Goals of PT Interventions;Up with Assist Only;Importance of Increasing Activity    Assessment/Progress  Assessment/Progress: Should Improve w/ Continued PT    AM-PAC 6 Clicks Basic Mobility Inpatient  Turning from your back to your side while in a flat bed without using bed rails: A Little  Moving from lying on your back to sitting on the side of a flatbed without using bedrails : A Lot  Moving to and from a bed to a chair (including a wheelchair): A Lot  Standing up from a chair using your arms (e.g. wheelchair, or bedside chair): A Lot  To walk in hospital room: Total  Climbing 3-5 steps with a railing: A Lot  Raw Score: 12  Standardized (T-scale) Score: 32.23  Basic Mobility CMS 0-100%: 61.94  CMS G Code Modifier for Basic Mobility: CL    Goals  Goal Formulation: With Patient  Time For Goal Achievement: 7 days  Patient Will Go Supine To/From Sit: w/ Stand By Assist  Patient Will Transfer Sit to Stand: w/ Stand By Assist  Patient Will Ambulate: Greater than 200 Feet, w/ Dan Humphreys, w/ Stand By Assist    Plan  Treatment Interventions: Mobility Training;Balance Activities;Endurance Training  Plan Frequency: 5 Days per Week  PT Plan for Next Visit: work bed mobility and sit to stand transfers while maintaining sternal precautions and progress ambulation  PT Discharge Recommendations  Recommendation: Inpatient setting    Therapist: Lynett Grimes, PT, DPT   Date: 07/28/2018

## 2018-07-28 NOTE — Progress Notes
Infectious Disease Progress Note    Name:  Anik Wesch   Today's Date:  07/28/2018  Admission Date: 07/21/2018    Consulted for endocarditis on pathology  ???  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 Darrick Penna), fibrinous exudate along with neutrophilic infiltrates seen on tissue sample from 07/21/18 - special stains negative for microorganisms  -negative: Bartonella, Brucella, Coxiella, Chlamydia, serologies a  -BC 6/29 x 2 sets NTD  ???  Severe aortic stenosis  Aortic annulus psuedoaneurysm with crescentic hematoma   S/p aortic root replacement, ascending aorta replacement, and repair of aortic annulus pseudoaneurysm 07/21/18  -06/10/18 CTA: The proximal ascending aorta measures 45 mm The descending thoracic aorta measures 25 mm. There is a 2 cm x 2 cm comminuted fluid collection consistent with a perivalvular pseudoaneurysm surrounded by a crescentic hematoma located along the non-coronary cusp of the aortic valve with questionable visualized neck measuring 1mm. No mass effect.  -06/29/18 CTA: Increase in size of a perivalvular pseudoaneurysm posterior to the aortic root, likely arising from the left coronary cusp.  -07/21/18 Underwent aortic root replacement, ascending aorta replacement, and repair of aortic annulus pseudoaneurysm with op note describing a tremendous amount of inflammation posterior to the aortic annulus and extending down onto the dome of the left atrium.  ???  Postoperative mediastinal hematoma status post chest tube placement 07/25/18  ???  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  CAD s/p DES 05/24/18  HTN  OSA    Recommendations:     -Continue empiric antibiotic therapy with vancomycin (pharm to dose), amp/sulbactam, and doxycycline pending ID w/o and cultures  -Monitor for drug toxicity and side-effects with CBC and CMP  -will need picc line before dc; hold for now - 1 lumen vs 2 TBD  -Follow up blood cultures from 07/26/18; yield may be low given his pre-treatment for surgical ppx and lack of fever a + culture result seems very unlikely  -Follow up Legionella serologies as well as Tropheryma whipplei serum PCR (send-off)  -FU 16S broad-range RNA from tissue    Will follow  Complexity of medical decision making is high due to the multi-system nature of the infectious disease process or potential for limb-threatening infection as well as concerns including but not limited to complexity of the patient's underlying illnesses, the identification and sensitivities of the organisms being treated, the potential for antimicrobial toxicities which are being monitored by complete blood counts and chemistry analysis between visits and the potential for drug-drug interactions, as well as concerns regarding immunologic function, and interplay of other issues.    Sadie Haber, DO  Division of Infectious Diseases  Pager [phone]    _____________________________________________________________________________  Interval History  Nayef College is a 78 y.o. male with a PMH significant for aortic stenosis, pseudoaneurysm of aortic annulus, NSTEMI, HTN, CAD, and OSA who presented to [facility] on 07/21/18 for a planned aortic valve procedure.    Seen this AM sitting up in his chair.    No f/c/s  Rare cough  - hard to cough up anything  Chest discomfort minimal  No n/v/abd pain  No other new concerns    Antimicrobial Start date End date   Cefazolin (peri-op) 07/21/18-07/22/18; 07/24/18 07/25/18  Vancomycin IV  07/21/18; 07/26/18 Active   Amp/sulbactam 07/26/18 Active   Doxycycline 07/26/18 Active   ??? ??? ???   ??? ??? ???   ??? ??? ???   Estimated Creatinine Clearance: 92.9 mL/min (based on SCr of 0.84 mg/dL).    Microbiology Review 07/26/18 Blood cx (3 sets): NGTD  07/26/18 Sputum GS/cx: >25 neutrophils/lpf, 10-25 squams/lpf, mod mixed bacteria - nml flora    Medications  Scheduled Meds:ampicillin/sulbactam (UNASYN) 3 g in sodium chloride 0.9% (NS) 100 mL IVPB (MB+), 3 g, Intravenous, Q6H*  aspirin chewable tablet 81 mg, 81 mg, Oral, QDAY  atorvastatin (LIPITOR) tablet 40 mg, 40 mg, Oral, QHS  clopiDOGrel (PLAVIX) tablet 75 mg, 75 mg, Oral, QDAY  docusate (COLACE) capsule 100 mg, 100 mg, Oral, BID  doxycycline (VIBRAMYCIN) tablet 100 mg, 100 mg, Oral, BID  furosemide (LASIX) injection 40 mg, 40 mg, Intravenous, BID(9-17)  heparin (porcine) PF syringe 5,000 Units, 5,000 Units, Subcutaneous, Q8H  insulin aspart U-100 (NOVOLOG FLEXPEN) injection PEN 0-12 Units, 0-12 Units, Subcutaneous, ACHS (22)  lidocaine (LIDODERM) 5 % topical patch 1-2 patch, 1-2 patch, Topical, QDAY    And  Verification of Patch Placement and Integrity - Lidocaine 5%, , Transdermal, BID  methIMAzole (TAPAZOLE) tablet 10 mg, 10 mg, Oral, QDAY    And  methIMAzole (TAPAZOLE) tablet 15 mg, 15 mg, Oral, QDAY  pneumococcal 23-val vaccine (PPSV23) (PNEUMOVAX 23) injection 0.5 mL, 0.5 mL, Intramuscular, ONCE  polyethylene glycol 3350 (MIRALAX) packet 17 g, 1 packet, Oral, BID  potassium chloride SR (K-DUR) tablet 10 mEq, 10 mEq, Oral, BID w/meals  senna/docusate (SENOKOT-S) tablet 2 tablet, 2 tablet, Oral, BID  tamsulosin (FLOMAX) capsule 0.4 mg, 0.4 mg, Oral, QHS  vancomycin (VANCOCIN) 1,750 mg in sodium chloride 0.9% (NS) IVPB, 1,750 mg, Intravenous, Q12H*    Continuous Infusions:    PRN and Respiratory Meds:acetaminophen Q6H PRN **OR** acetaminophen Q6H PRN, alum/mag hydroxide/simeth Q4H PRN, bisacodyL QDAY PRN, hydrALAZINE Q6H PRN, milk of magnesia (CONC) QDAY PRN, ondansetron Q6H PRN **OR** ondansetron (ZOFRAN) IV Q6H PRN, oxyCODONE Q4H PRN, potassium chloride SR PRN **OR** potassium chloride PRN **OR** potassium chloride in water PRN, vancomycin, pharmacy to manage Per Pharmacy Physical Examination                          Vital Signs: Last                  Vital Signs: 24 Hour Range   BP: 145/67 (07/01 1455)  Temp: 36.9 ???C (98.4 ???F) (07/01 1455)  Pulse: 59 (07/01 1455)  Respirations: 16 PER MINUTE (07/01 1455)  SpO2: 98 % (07/01 1455) BP: (133-151)/(60-72)   Temp:  [36.3 ???C (97.4 ???F)-37 ???C (98.6 ???F)]   Pulse:  [59-83]   Respirations:  [16 PER MINUTE]   SpO2:  [95 %-100 %]      General appearance: alert, oriented, NAD  H&N: no oral lesions/thrush  Lungs: decreased bs bases  Heart: Regular rhythm, reg rate, with no murmur  Chest: sternal dressing in place -dry  Abdomen: soft, non-tender, non-distended, normoactive bowel sounds  Ext:  1+LE edema  Skin: no rash    Lines/drains/tubes:  PIV, CTs  Laboratory   Hematology  Recent Labs     07/26/18  0300 07/27/18  0347 07/28/18  0510   WBC 21.3* 14.6* 12.4*   HGB 11.1* 10.1* 10.8*   HCT 32.5* 29.8* 32.0*   PLTCT 96* 73* 122*     Chemistry  Recent  Labs     07/26/18  2019 07/27/18  0347 07/27/18  1821 07/28/18  0510   NA 133* 135*  --  138   K 4.4 4.4 4.6 4.0   CL 103 104  --  106   CO2 22 24  --  25   BUN 27* 27*  --  22   CR 0.90 0.89  --  0.84   GFR >60 >60  --  >60   GLU 89 88  --  88   CA 8.1* 8.0*  --  8.0*       Radiology and other Diagnostics Review   Pertinent radiology reviewed.         Sadie Haber, DO  ID  [phone]

## 2018-07-28 NOTE — Progress Notes
Cardiothoracic Surgery   Progress Note     Joel Hernandez  Today's Date:  07/28/2018  Admission Date: 07/21/2018  LOS: 7 days      Procedure: EXPLORATION POSTOPERATIVE WOUND - CHEST: 35820 (CPT???) -Hematoma evacuation-07/24/2018      Procedure: INSERTION/ REPLACEMENT PERMANENT PACEMAKER WITH ATRIAL AND VENTRICULAR LEAD: 33208 (CPT???)  FLUOROSCOPY - CARDIAC: 16109 (CPT???) 6/26    Procedure: Aortic Root replacement (Freestyle)/Implantation of coronary artery buttons, Circ arrest: 33405 (CPT???) 6/24        Principal Problem:    S/P AVR (aortic valve replacement) and aortoplasty  Active Problems:    Hypertension    Obstructive sleep apnea    Stenosis of lateral recess of lumbar spine    Thrombocytopenia (HCC)    HLD (hyperlipidemia)    Acute on chronic diastolic CHF (congestive heart failure), NYHA class 2 (HCC)    Hypokalemia    S/p bare metal coronary artery stent    Intramural hematoma of thoracic aorta (HCC)    Pseudoaneurysm of aorta (HCC)    Complete heart block (HCC)    Junctional rhythm    Respiratory acidosis    Vasogenic shock (HCC)    Lactic acidosis        Assessment/Plan:      Neuro ??? A&Ox4. Pain well controlled. Continue prn tylenol, prn lidoderm patches & low dose PRN oxy.   CV ??? PPM in place, V paced 60. Epi weaned off 6/29.Marland Kitchen  Plan to wean dobutamine to 2 mcg/kg/min & slowly off after. Continue bASA, atorvastatin. Hold BB while on dobutamine. TTE 6/29: LVEF 75% & hyperdynamic, mild reduce in RV function, grade 3 diastolic dysfunction, mild TR, mild to moderate MR.   Resp ??? Extubated 6/28. Cxr: bibasilar pleural effusions and atelectasis, increase in pulmonary venous congestion. Continue IS, aggressive pulm toilet. Chest tubes in place -L 190, r-330 maintain today per Dr. Helen Hashimoto.   Renal ??? Serum creatinine 1.01-->0.89. UOP ~40-50 mL/hr. Net -3.2 L/24 hrs. Give lasix 20 mg IV this am. . Continue pta flomax.   GI - Continue regular diet. LBM 6/30. Pt has been constipated -- continue aggressive bowel regimen.   ID ??? Afebrile. WBC 21-->14. 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 ID following - appreciate assistance. Pan cx's sent 6/29 - NGTD, continue to follow. Continue doxycycline, vancomycin and unasyn.   Heme-Hgb 11-->10, platelets 96-->73-->122. Continue to monitor acute blood loss anemia and thrombocytopenia. Continue Plavix for history of DES. Foot pumps for DVT prophylaxis. Subcu heparin POD 3 per CTS.   FEN ??? If creatinine < 2.0, replace Mg and K to minimize cardiac arrhythmias. Hgb A1c 5.2%.MDCF SSI PRN.   Activity ???  Up to chair this am. Early cardiac PT/OT.     Prophylaxis Review:  Lines:  No  Antibiotic Usage:  Yes; Infection present or suspected:  AV endocarditis  VTE:  Mechanical prophylaxis; Foot pump  Urinary Catheter: Yes; Retain foley due to:  Need for accurate Intake and Output. Remove today    Disposition: Increase activity, discuss dobutamine with staff. Lasix 20 mg IV this am, leave chest tubes, DC foley      Vincent Gros, PA-C     07/28/2018    Subjective:       HPI:   Joel Hernandez is a 78 y.o. male with pmhx of AS, CAD s/p PCI 04/2018, HTN, HLD, sleep apnea (doesn't need cpap after weight loss), former smoker, hyperthyroid, BPH. He was recently admitted for planned TAVR but it was  aborted r/t a pseudoaneurysm around the aortic annulus (thought to be from previous PCI in April). His eliquis was dc'ed and he was started on plavix. Now s/p AVR & root replacement today 6/24 with Dr. Helen Hashimoto. He was brought to the ICU following the procedure.???  ???  POD 1:  CHB  POD 2:  Dual chamber PPM, CTs discontinued  POD 3:  Tele status, start Metop. Patient transferred to CTP. Patient coded presumable vasovagal while in the restroom. Patient assisted to floor and received 1 round of chest compressions before achieving ROSC. Patient transferred to CTI where he was initially stable. Patient quickly declined hemodynamically requiring emergent intubation, central line and arterial line placedment. Pacer evaluation with proper pacer function. TTE with moderate amount of fluid around heart with decreased function. Patient emergently taken to CVOR for mediastinal exploration. Patient currently recovering postoperatively in CTI.   6/30: dc swan, slow wean dobutamine, lasix 20 IV this am. Dc swan. Continue empiric coverage for endocarditis. Transfer to floor.       REVIEW OF SYSTEMS:   Review of Systems   Constitutional: Negative for chills, fever and malaise/fatigue.   Respiratory: Negative for cough, hemoptysis and sputum production.    Cardiovascular: Positive for chest pain and leg swelling. Negative for orthopnea and claudication.   Gastrointestinal: Negative for blood in stool, heartburn, nausea and vomiting.   Genitourinary: Negative for frequency and hematuria.   Skin: Negative for itching and rash.   Neurological: Negative for dizziness, speech change, weakness and headaches.   Psychiatric/Behavioral: Negative for hallucinations. The patient has insomnia. The patient is not nervous/anxious.        Objective:        Medications:  Scheduled Meds:ampicillin/sulbactam (UNASYN) 3 g in sodium chloride 0.9% (NS) 100 mL IVPB (MB+), 3 g, Intravenous, Q6H*  aspirin chewable tablet 81 mg, 81 mg, Oral, QDAY  atorvastatin (LIPITOR) tablet 40 mg, 40 mg, Oral, QHS  clopiDOGrel (PLAVIX) tablet 75 mg, 75 mg, Oral, QDAY  docusate (COLACE) capsule 100 mg, 100 mg, Oral, BID  doxycycline (VIBRAMYCIN) tablet 100 mg, 100 mg, Oral, BID  insulin aspart U-100 (NOVOLOG FLEXPEN) injection PEN 0-12 Units, 0-12 Units, Subcutaneous, ACHS (22)  lidocaine (LIDODERM) 5 % topical patch 1-2 patch, 1-2 patch, Topical, QDAY    And  Verification of Patch Placement and Integrity - Lidocaine 5%, , Transdermal, BID  methIMAzole (TAPAZOLE) tablet 10 mg, 10 mg, Oral, QDAY    And  methIMAzole (TAPAZOLE) tablet 15 mg, 15 mg, Oral, QDAY  pneumococcal 23-val vaccine (PPSV23) (PNEUMOVAX 23) injection 0.5 mL, 0.5 mL, Intramuscular, ONCE  polyethylene glycol 3350 (MIRALAX) packet 17 g, 1 packet, Oral, BID  senna/docusate (SENOKOT-S) tablet 2 tablet, 2 tablet, Oral, BID  tamsulosin (FLOMAX) capsule 0.4 mg, 0.4 mg, Oral, QHS  vancomycin (VANCOCIN) 1,250 mg in sodium chloride 0.9% (NS) IVPB, 15 mg/kg, Intravenous, Q12H*    Continuous Infusions:  ??? DOBUTamine (DOBUTREX) 1 g/D5W 250 mL infusion (std conc)(premade) 1 mcg/kg/min (07/27/18 2200)     PRN and Respiratory Meds:acetaminophen Q6H PRN **OR** acetaminophen Q6H PRN, alum/mag hydroxide/simeth Q4H PRN, bisacodyL QDAY PRN, hydrALAZINE Q6H PRN, milk of magnesia (CONC) QDAY PRN, ondansetron Q6H PRN **OR** ondansetron (ZOFRAN) IV Q6H PRN, oxyCODONE Q4H PRN, potassium chloride SR PRN **OR** potassium chloride PRN **OR** potassium chloride in water PRN, vancomycin, pharmacy to manage Per Pharmacy                       Vital Signs: Last [facility]  Vital Signs: 24 Hour Range   BP: 133/69 (07/01 0400)  Temp: 37 ???C (98.6 ???F) (07/01 0400)  Pulse: 83 (07/01 0400)  Respirations: 16 PER MINUTE (07/01 0400)  SpO2: 96 % (07/01 0400)  SpO2 Pulse: 62 (06/30 1100) BP: (133-148)/(60-69)   ABP: (100-161)/(44-78)   Temp:  [36.6 ???C (97.9 ???F)-37.3 ???C (99.1 ???F)]   Pulse:  [60-83]   Respirations:  [16 PER MINUTE-27 PER MINUTE]   SpO2:  [95 %-100 %]    Intensity Pain Scale (Self Report): 5 (07/28/18 0342) Vitals:    07/26/18 0800 07/28/18 0645 07/28/18 0657   Weight: 90.3 kg (199 lb) 103 kg (227 lb 1.2 oz) 103.2 kg (227 lb 9.6 oz)           Intake/Output Summary:  (Last 24 hours)    Intake/Output Summary (Last 24 hours) at 07/28/2018 0753  Last data filed at 07/28/2018 0342  Gross per 24 hour   Intake 1553.85 ml   Output 4820 ml   Net -3266.15 ml         Physical Exam:         Neuro: Alert and orientedx4, MAE   Cardiovascular: RRR no rub or murmur  Respiratory: LS CTA bil - diminished in the bases  GI: soft, NT, hypoactive BS  Extremities: 1+ peripheral edema Incisions: Sternal incision dressing, dry and intact. No crepitus or sternal instability.    Laboratory:  LABS:  Recent Labs     07/25/18  1415 07/26/18  0300 07/26/18  2019 07/27/18  0347 07/27/18  1821 07/28/18  0510   NA 140 136* 133* 135*  --  138   K 4.7 4.2 4.4 4.4 4.6 4.0   CL 110 107 103 104  --  106   CO2 21 22 22 24   --  25   GAP 9 7 8 7   --  7   BUN 37* 32* 27* 27*  --  22   CR 1.32* 1.01 0.90 0.89  --  0.84   GLU 136* 119* 89 88  --  88   CA 9.5 8.8 8.1* 8.0*  --  8.0*   MG  --  1.8 1.9 2.3 2.0 2.0       Recent Labs     07/25/18  1415 07/25/18  1755 07/26/18  0300 07/27/18  0347 07/28/18  0510   WBC 19.9* 22.4* 21.3* 14.6* 12.4*   HGB 12.5* 11.1* 11.1* 10.1* 10.8*   HCT 36.4* 32.7* 32.5* 29.8* 32.0*   PLTCT 123* 117* 96* 73* 122*      Estimated Creatinine Clearance: 92.9 mL/min (based on SCr of 0.84 mg/dL).  Vitals:    07/26/18 0800 07/28/18 0645 07/28/18 0657   Weight: 90.3 kg (199 lb) 103 kg (227 lb 1.2 oz) 103.2 kg (227 lb 9.6 oz)      Recent Labs     07/25/18  1100   PHART 7.41   PO2ART 95           Radiology and Other Diagnostic Procedures Review:    Reviewed

## 2018-07-29 DIAGNOSIS — I350 Nonrheumatic aortic (valve) stenosis: Principal | ICD-10-CM

## 2018-07-29 LAB — POC GLUCOSE: Lab: 86 mg/dL (ref 70–100)

## 2018-07-29 LAB — MAGNESIUM: Lab: 1.7 mg/dL — ABNORMAL LOW (ref >60)

## 2018-07-29 LAB — LEGIONELLA AB IGM: Lab: <1

## 2018-07-29 LAB — CBC: Lab: 10 K/UL — ABNORMAL HIGH (ref >60)

## 2018-07-29 LAB — BASIC METABOLIC PANEL: Lab: 139 MMOL/L — ABNORMAL HIGH (ref >60)

## 2018-07-29 MED ORDER — MAGNESIUM CHLORIDE 64 MG PO TBER
535 mg | ORAL | 0 refills | Status: DC | PRN
Start: 2018-07-29 — End: 2018-08-05
  Administered 2018-07-31: 13:00:00 535 mg via ORAL

## 2018-07-29 MED ORDER — METOPROLOL TARTRATE 25 MG PO TAB
12.5 mg | Freq: Once | ORAL | 0 refills | Status: CP
Start: 2018-07-29 — End: ?
  Administered 2018-07-29: 19:00:00 12.5 mg via ORAL

## 2018-07-29 MED ORDER — MAGNESIUM SULFATE IN D5W 1 GRAM/100 ML IV PGBK
1 g | INTRAVENOUS | 0 refills | Status: DC | PRN
Start: 2018-07-29 — End: 2018-08-05
  Administered 2018-08-01 – 2018-08-02 (×2): 1 g via INTRAVENOUS

## 2018-07-29 MED ORDER — METOPROLOL TARTRATE 25 MG PO TAB
25 mg | Freq: Two times a day (BID) | ORAL | 0 refills | Status: DC
Start: 2018-07-29 — End: 2018-07-30
  Administered 2018-07-30: 02:00:00 25 mg via ORAL

## 2018-07-29 MED ORDER — METOPROLOL TARTRATE 25 MG PO TAB
12.5 mg | Freq: Two times a day (BID) | ORAL | 0 refills | Status: DC
Start: 2018-07-29 — End: 2018-07-29
  Administered 2018-07-29: 14:00:00 12.5 mg via ORAL

## 2018-07-29 NOTE — Consults
Physical Medicine & Rehabilitation Consult Service    Name: Joel Hernandez        MRN: [id number]          DOB: Oct 03, 1940          Age: 78 y.o.  Admission Date: 07/21/2018             LOS: 8 days        Date of Service: 07/29/2018  Financial Class: Payor: MEDICARE / Plan: MEDICARE PART A AND B / Product Type: Medicare /   Referring Physician: Earlene Plater (Trip) L II*  Reason for Consult: evaluate for Post-Acute Rehab/Placement  Precautions: Fall, sternal    Assessment & Plan:  Principal Problem:    S/P AVR (aortic valve replacement) and aortoplasty  Active Problems:    Hypertension    Obstructive sleep apnea    Stenosis of lateral recess of lumbar spine    HLD (hyperlipidemia)    Acute on chronic diastolic CHF (congestive heart failure), NYHA class 2 (HCC)    Hypokalemia    S/p bare metal coronary artery stent    Presence of permanent cardiac pacemaker    Unplanned return to operating room with repeat sternotomy for cardiac tamponade  Chest tube placement  Chronic sensory changes RLE  Chronic R foot drop  Gait abnormality  Impaired mobility/ADLs  Impaired transfers         Joel Hernandez is a 78 y.o. year old male admitted to [facility] on  07/21/2018 with the following issues:    Debility S/p AVR and aortoplasty complicated by repeat sternotomy for cardiac tamponade, endocarditis    Recommendations:  Post-acute care rehabilitation needs: potentially acute inpatient rehabilitation      -At the moment, patient???s medical complexity warrants daily physician oversight and functional goals consistent with intensive rehabilitation in acute inpatient rehabilitation.   -Given that patient resides alone, goals for rehab admission would be mod-I.   -Given that patient's limitations are primarily pain and endurance related, it is possible that patient may progress towards safe discharge home or that rehab needs could be met at lesser level of care (subacute rehab) once medically stable for discharge.  -Please see potential barriers to rehab admission below.    Impairments: pain, poor activity tolerance and weakness  Activity Limitations:  grooming, bathing, dressing - upper, dressing - lower, toileting, transfers, ambulation and stairs  Participation Restrictions: unable to return home safely  Family / Patient Dispositional Goals: return home to previous level of function    Overall Functional Goals  Gait and mobility Mod-I   Transfers Mod-I   ADLs Mod-I   Cognition / Communication Cognition grossly intact and Speech intact        Barriers/Facilitators:  Barriers: Medical complexity and Poor strength/endurance  Facilitators: patient motivation  Rehabilitation Prognosis: Good  Tolerance for three hours of therapy a day: Fair    Prior to the inpatient rehabilitation admission complete the following:   *Endurance: The patient will need to be clearly able to or reasonably expected to be able to endure 3 hours of constructive therapy per day.  This will need to be determined prior to considering admission to acute inpatient rehabilitation.    *Chest tubes: would need to be removed or converted to portable prior to consideration of acute inpatient rehab admission    *IV diuretics: Currently on IV lasix - would need to be converted to po prior to potential admission    *Antibiotics: Treatment course would  need to be clarified prior to potential admission  ____________________________________________________________________________    Impaired gait/mobility/transfers:  The patient will benefit from continued work with PT to address mobility deficits    Impaired ADLs:  The patient will benefit from ongoing OT to address functional deficits    Chronic R foot drop  Patient no longer wears     Thank you for this consultation.  Please call our consult pager with questions or concerns.    Judd Gaudier, MD  Rehab Consult Pager: [phone]    History of Present Illness: CC: Generalized weakness    Hospital Course: Joel Hernandez is a pleasant 78 y.o. male with past medical history of aortic stenosis, CAD status post PCI, HTN, HLD, former smoker, BPH, hypothyroid who was recently admitted for planned aortic valve replacement which was aborted due to pseudoaneurysm surrounding the aortic annulus.  Patient ultimately proceeded with aortic valve replacement and root replacement on 6/24 with Dr. Helen Hashimoto.  Postoperatively on day 3 patient was coded presumably due to vasovagal episode while in restaurant.  Patient subsequently declined hemodynamically requiring emergent intubation.  Transthoracic echo with moderate amount of fluid around heart with decreased function and patient emergently taken to the OR for mediastinal exploration.  Hospital course has been complicated by endocarditis for which infectious disease service has been assisting.    The primary team has consulted PT and OT, and will continue working with therapies to address functional and mobility deficits, rehab is now consulted for post-acute rehab/placement recommendations.       Medical History:   Diagnosis Date   ??? Aortic stenosis    ??? Atrial fibrillation (HCC)    ??? Back pain    ??? CAD (coronary artery disease)    ??? Disorder of thyroid gland    ??? Heart murmur 09/15/2013   ??? Hyperlipidemia    ??? Hypertension 09/15/2013   ??? NSTEMI (non-ST elevated myocardial infarction) (HCC) 12/2015    s/p stent placement   ??? OSA on CPAP     No CPAP use after 55lb weight loss   ??? 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 SCC (squamous cell carcinoma), arm    ??? Severe aortic stenosis    ??? Skin cancer    ??? Sleep apnea         Surgical History:   Procedure Laterality Date   ??? LUMBAR LAMINECTOMY  2004    L4-5   ??? LUMBAR LAMINECTOMY  2011    Hemilaminectomies L 2-3 L3-4   ??? RIGHT LUMBAR 4-5 REDO LAMINECTOMY AND LATERAL RECESS DECOMPRESSION Right 12/04/2016 Performed by Theotis Barrio, MD at [facility] OR   ??? LAMINECTOMY/ FACETECTOMY/ FORAMINOTOMY WITH DECOMPRESSION - 1 VERTEBRAL SEGMENT - LUMBAR  12/04/2016    Performed by Theotis Barrio, MD at [facility] OR   ??? CORONARY STENT PLACEMENT  12/2016   ??? KNEE REPLACEMENT Right 08/2017   ??? ANGIOGRAPHY CORONARY ARTERY WITH LEFT HEART CATHETERIZATION N/A 05/24/2018    Performed by Marcell Barlow, MD at [facility] CATH LAB   ??? POSSIBLE PERCUTANEOUS CORONARY STENT PLACEMENT WITH ANGIOPLASTY N/A 05/24/2018    Performed by Marcell Barlow, MD at [facility] CATH LAB   ??? CLOSURE PSEUDOANEURYSM BY INJECTION- LOWER EXTREMITY N/A 05/26/2018    Performed by Marcell Barlow, MD at [facility] CATH LAB   ??? CORONARY STENT PLACEMENT  05/2018    x2   ??? Aortic Root replacement (Freestyle)/Implantation  of coronary artery buttons, Circ arrest N/A 07/21/2018    Performed by Earlene Plater (Trip) L III, MD at [facility] CVOR   ??? INSERTION/ REPLACEMENT PERMANENT PACEMAKER WITH ATRIAL AND VENTRICULAR LEAD Left 07/23/2018    Performed by Lorain Childes, MD at [facility] EP LAB   ??? FLUOROSCOPY - CARDIAC  07/23/2018    Performed by Lorain Childes, MD at [facility] EP LAB   ??? EXPLORATION POSTOPERATIVE WOUND - CHEST N/A 07/24/2018    Performed by Earlene Plater (Trip) L III, MD at [facility] CVOR   ??? COLONOSCOPY     ??? HEART CATHETERIZATION  01/2016, 05/2018    Stent x 1 LAD placement following NSTEMI, 2 stents placed 04/2018   ??? HX HEART CATHETERIZATION     ??? SKIN CANCER EXCISION  2017, 2018    SCC Arm         Social History     Socioeconomic History   ??? Marital status: Married     Spouse name: Not on file   ??? Number of children: Not on file   ??? Years of education: Not on file   ??? Highest education level: Not on file   Occupational History   ??? Not on file   Tobacco Use   ??? Smoking status: Former Smoker     Packs/day: 0.50     Years: 15.00     Pack years: 7.50     Types: Cigarettes     Last attempt to quit: 11/20/1969     Years since quitting: 48.7   ??? Smokeless tobacco: Never Used   Substance and Sexual Activity ??? Alcohol use: No   ??? Drug use: Never   ??? Sexual activity: Not on file   Other Topics Concern   ??? Not on file   Social History Narrative   ??? Not on file       Family History   Problem Relation Age of Onset   ??? Heart Disease Mother         Scheduled Meds:ampicillin/sulbactam (UNASYN) 3 g in sodium chloride 0.9% (NS) 100 mL IVPB (MB+), 3 g, Intravenous, Q6H*  aspirin chewable tablet 81 mg, 81 mg, Oral, QDAY  atorvastatin (LIPITOR) tablet 40 mg, 40 mg, Oral, QHS  clopiDOGrel (PLAVIX) tablet 75 mg, 75 mg, Oral, QDAY  docusate (COLACE) capsule 100 mg, 100 mg, Oral, BID  doxycycline (VIBRAMYCIN) tablet 100 mg, 100 mg, Oral, BID  furosemide (LASIX) injection 40 mg, 40 mg, Intravenous, BID(9-17)  heparin (porcine) PF syringe 5,000 Units, 5,000 Units, Subcutaneous, Q8H  lidocaine (LIDODERM) 5 % topical patch 1-2 patch, 1-2 patch, Topical, QDAY    And  Verification of Patch Placement and Integrity - Lidocaine 5%, , Transdermal, BID  methIMAzole (TAPAZOLE) tablet 10 mg, 10 mg, Oral, QDAY    And  methIMAzole (TAPAZOLE) tablet 15 mg, 15 mg, Oral, QDAY  metoprolol tartrate (LOPRESSOR) tablet 12.5 mg, 12.5 mg, Oral, BID  pneumococcal 23-val vaccine (PPSV23) (PNEUMOVAX 23) injection 0.5 mL, 0.5 mL, Intramuscular, ONCE  polyethylene glycol 3350 (MIRALAX) packet 17 g, 1 packet, Oral, BID  potassium chloride SR (K-DUR) tablet 10 mEq, 10 mEq, Oral, BID w/meals  senna/docusate (SENOKOT-S) tablet 2 tablet, 2 tablet, Oral, BID  tamsulosin (FLOMAX) capsule 0.4 mg, 0.4 mg, Oral, QHS  vancomycin (VANCOCIN) 1,750 mg in sodium chloride 0.9% (NS) IVPB, 1,750 mg, Intravenous, Q12H*    Continuous Infusions:  PRN and Respiratory Meds:acetaminophen Q6H PRN **OR** acetaminophen Q6H PRN, alum/mag hydroxide/simeth Q4H PRN, bisacodyL QDAY PRN, hydrALAZINE Q6H PRN, magnesium  chloride PRN **OR** magnesium sulfate PRN (On Call from Rx), milk of magnesia (CONC) QDAY PRN, ondansetron Q6H PRN **OR** ondansetron (ZOFRAN) IV Q6H PRN, oxyCODONE Q4H PRN, potassium chloride SR PRN **OR** potassium chloride PRN **OR** potassium chloride in water PRN, vancomycin, pharmacy to manage Per Pharmacy       Allergies   Allergen Reactions   ??? Lisinopril SEE COMMENTS     Reports respiratory distress and back pain       Prior Level of Function:      The patient was mod-I for mobility (SPC, R foot drop), independent for ADLs     Home Environment:  Home Situation: Lives Alone (07/28/2018  3:00 PM)    Patient Owned Equipment: Single Point Cane (07/28/2018  3:00 PM)    Type of Home: House (07/28/2018  3:00 PM)    No data recorded  In-Home Stairs: 1-2 Flights of Stairs (to bedroom) (07/28/2018  3:00 PM)    No data recorded  No data recorded    Current Level Of Function:  PT Gait:Gait Distance: 70 feet Gait: Assistance Level: Minimal Assist, Safety Considerations Gait: Assistive Device: 4-Wheeled Walker  Bed Mobility/Transfers  Bed Mobility: Supine to Sit: Minimal Assist, x2 People, Assist with B LE, Assist with Trunk  Transfer Type: Sit to Stand  Transfer: Assistance Level: To/From, Bed Side Chair, Moderate Assist, x2 People  Transfer: Assistive Device: Doctor, general practice  Transfers: Type Of Assistance: Verbal Cues, To Maintain Precautions, For Safety Considerations  End Of Activity Status: Up in Chair   OT ADL's  Where Assessed: Chair  Eating Assist: Stand By Assist  Eating Deficits: Setup  LE Dressing Assist: Moderate Assist  LE Dressing Deficits: Don/Doff R Sock(Pt able to don L sock, crossover technique seated)  Toileting Assist: Maximum Assist  Toileting Deficits: Perineal Hygiene(gown management)  Comment: Participates in toileting with maximum assist for gown management and peri care following bowel movement- requires steadying assist while standing with ICU walker as well.     Review of Systems:    A 14 point review of systems was negative except for: that noted in the HPI      Physical Exam:    BP: 151/63 (07/02 0747) Temp: 36.9 ???C (98.5 ???F) (07/02 0747)  Pulse: 60 (07/02 0747)  Respirations: 17 PER MINUTE (07/02 0747)  SpO2: 96 % (07/02 0747)  Body mass index is 29.45 kg/m???.     Gen: Alert & Oriented X 3  HEENT: EOMI  Neck: Supple, no elevated JVP  Chest: chest tube x2  Lungs: non labored breathing  Abdomen: Soft, non-tender, non-distended  GU:  + Foley  Skin: no gross lesions appreciated  Ext: 1+ edema bilaterally  MS:   Root Right Left   Elbow Flexion C5 4 4   Elbow Extension C7 4 4   Wrist Extension C6 5 5   Finger Flexion C8 5 5   Finger Abduction T1 5 5   Hip Flexion L2 4 4   Knee Extension L3 5 5   Dorsiflexion L4 2 5   Plantarflexion S1 4 5     Neuro:  Cranial Nerves Cranial Nerves 2-12 are grossly intact   DTR's 1+ throughout   Babinski Plantar Reflex is Downgoing Bilaterally   Upper Extremity Tone Normal   Lower Extremity Tone Normal   Upper Extremity Sensation Intact to light touch bilaterally   Lower Extremity Sensation Impaired to light touch below level of R knee   Clonus Negative Bilaterally   Memory/Cognition/Speech Mildly  delayed processing but appropriate responses to all questions           Intake/Output Summary (Last 24 hours) at 07/29/2018 0920  Last data filed at 07/29/2018 0840  Gross per 24 hour   Intake 820 ml   Output 5130 ml   Net -4310 ml        Hematology:    Lab Results   Component Value Date    HGB 11.1 07/29/2018    HCT 31.8 07/29/2018    PLTCT 154 07/29/2018    WBC 10.3 07/29/2018    NEUT 79 06/09/2018    ANC 6.10 06/09/2018    ALC 0.94 06/09/2018    MONA 6 06/09/2018    AMC 0.44 06/09/2018    ABC 0.07 06/09/2018    MCV 92.2 07/29/2018    MCHC 34.8 07/29/2018    MPV 8.4 07/29/2018    RDW 15.1 07/29/2018   , Coagulation:    Lab Results   Component Value Date    PTT 30.8 07/25/2018    INR 1.4 07/25/2018    and General Chemistry:    Lab Results   Component Value Date    NA 139 07/29/2018    K 3.8 07/29/2018    CL 105 07/29/2018    GAP 8 07/29/2018    BUN 21 07/29/2018    CR 0.86 07/29/2018 GLU 92 07/29/2018    CA 7.8 07/29/2018    ALBUMIN 2.4 07/25/2018    LACTIC 2.6 07/25/2018    OBSCA 1.63 07/25/2018    MG 1.7 07/29/2018    TOTBILI 0.7 07/25/2018        Radiology:   CXR 7/1  IMPRESSION  1. Support devices as above.  2. Trace right apical pneumothorax with small left greater than right   pleural effusions.  3. Left greater than right basal opacities likely postoperative   atelectasis, edema or both.     Judd Gaudier, MD

## 2018-07-29 NOTE — Progress Notes
Infectious Disease Progress Note    Name:  Joel Hernandez   Today's Date:  07/29/2018  Admission Date: 07/21/2018    Consulted for endocarditis on pathology  ???  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 Darrick Penna), fibrinous exudate along with neutrophilic infiltrates seen on tissue sample from 07/21/18 - special stains negative for microorganisms  -negative: Bartonella, Brucella, Coxiella, Chlamydia, serologies neg, Tropheryma whipplei PCR neg  -BC 6/29 x 3 sets NTD  ???  Severe aortic stenosis  Aortic annulus psuedoaneurysm with crescentic hematoma   S/p aortic root replacement, ascending aorta replacement, and repair of aortic annulus pseudoaneurysm 07/21/18  -06/10/18 CTA: The proximal ascending aorta measures 45 mm The descending thoracic aorta measures 25 mm. There is a 2 cm x 2 cm comminuted fluid collection consistent with a perivalvular pseudoaneurysm surrounded by a crescentic hematoma located along the non-coronary cusp of the aortic valve with questionable visualized neck measuring 1mm. No mass effect.  -06/29/18 CTA: Increase in size of a perivalvular pseudoaneurysm posterior to the aortic root, likely arising from the left coronary cusp.  -07/21/18 Underwent aortic root replacement, ascending aorta replacement, and repair of aortic annulus pseudoaneurysm with op note describing a tremendous amount of inflammation posterior to the aortic annulus and extending down onto the dome of the left atrium.  ???  Postoperative mediastinal hematoma status post chest tube placement 07/25/18  ???  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  CAD s/p DES 05/24/18  HTN  OSA    Recommendations: -Continue empiric antibiotic therapy with vancomycin (pharm to dose), amp/sulbactam, and doxycycline while FU cultures; if unasyn will be a cost/dosing issue can price ertapenem 1 g daily intead  -Monitor for drug toxicity and side-effects with CBC and CMP  -will need picc line before dc  -Follow up blood cultures from 07/26/18; yield may be low given his pre-treatment for surgical ppx and lack of fever a + culture result seems very unlikely  -Follow up Legionella serologies and 16S broad-range RNA from tissue    Will follow  Complexity of medical decision making is high due to the multi-system nature of the infectious disease process or potential for limb-threatening infection as well as concerns including but not limited to complexity of the patient's underlying illnesses, the identification and sensitivities of the organisms being treated, the potential for antimicrobial toxicities which are being monitored by complete blood counts and chemistry analysis between visits and the potential for drug-drug interactions, as well as concerns regarding immunologic function, and interplay of other issues.    Sadie Haber, DO  Division of Infectious Diseases  Pager [phone]    _____________________________________________________________________________  Interval History  Glendell Wartman is a 78 y.o. male with a PMH significant for aortic stenosis, pseudoaneurysm of aortic annulus, NSTEMI, HTN, CAD, and OSA who presented to [facility] on 07/21/18 for a planned aortic valve procedure.    Seen this AM sitting up in his chair. Good spirits    No f/c/s  Occ cough,  No sputum  Chest discomfort minimal - 3 at most, inc w activity  No n/v/abd pain  No other new concerns    Antimicrobial Start date End date   Cefazolin (peri-op) 07/21/18-07/22/18; 07/24/18 07/25/18   Vancomycin  IV  07/21/18; 07/26/18 Active   Amp/sulbactam 07/26/18 Active   Doxycycline 07/26/18 Active   ??? ??? ???   ??? ??? ???   ??? ??? ??? Estimated Creatinine Clearance: 89.9 mL/min (based on SCr of 0.86 mg/dL).    Microbiology Review   07/26/18 Blood cx (3 sets): NGTD  07/26/18 Sputum GS/cx: >25 neutrophils/lpf, 10-25 squams/lpf, mod mixed bacteria - nml flora    Medications  Scheduled Meds:ampicillin/sulbactam (UNASYN) 3 g in sodium chloride 0.9% (NS) 100 mL IVPB (MB+), 3 g, Intravenous, Q6H*  aspirin chewable tablet 81 mg, 81 mg, Oral, QDAY  atorvastatin (LIPITOR) tablet 40 mg, 40 mg, Oral, QHS  clopiDOGrel (PLAVIX) tablet 75 mg, 75 mg, Oral, QDAY  docusate (COLACE) capsule 100 mg, 100 mg, Oral, BID  doxycycline (VIBRAMYCIN) tablet 100 mg, 100 mg, Oral, BID  furosemide (LASIX) injection 40 mg, 40 mg, Intravenous, BID(9-17)  heparin (porcine) PF syringe 5,000 Units, 5,000 Units, Subcutaneous, Q8H  lidocaine (LIDODERM) 5 % topical patch 1-2 patch, 1-2 patch, Topical, QDAY    And  Verification of Patch Placement and Integrity - Lidocaine 5%, , Transdermal, BID  methIMAzole (TAPAZOLE) tablet 10 mg, 10 mg, Oral, QDAY    And  methIMAzole (TAPAZOLE) tablet 15 mg, 15 mg, Oral, QDAY  metoprolol tartrate (LOPRESSOR) tablet 25 mg, 25 mg, Oral, BID  pneumococcal 23-val vaccine (PPSV23) (PNEUMOVAX 23) injection 0.5 mL, 0.5 mL, Intramuscular, ONCE  polyethylene glycol 3350 (MIRALAX) packet 17 g, 1 packet, Oral, BID  potassium chloride SR (K-DUR) tablet 10 mEq, 10 mEq, Oral, BID w/meals  senna/docusate (SENOKOT-S) tablet 2 tablet, 2 tablet, Oral, BID  tamsulosin (FLOMAX) capsule 0.4 mg, 0.4 mg, Oral, QHS  vancomycin (VANCOCIN) 1,750 mg in sodium chloride 0.9% (NS) IVPB, 1,750 mg, Intravenous, Q12H*    Continuous Infusions:    PRN and Respiratory Meds:acetaminophen Q6H PRN **OR** acetaminophen Q6H PRN, alum/mag hydroxide/simeth Q4H PRN, bisacodyL QDAY PRN, hydrALAZINE Q6H PRN, magnesium chloride PRN **OR** magnesium sulfate PRN (On Call from Rx), milk of magnesia (CONC) QDAY PRN, ondansetron Q6H PRN **OR** ondansetron (ZOFRAN) IV Q6H PRN, oxyCODONE Q4H PRN, potassium chloride SR PRN **OR** potassium chloride PRN **OR** potassium chloride in water PRN, vancomycin, pharmacy to manage Per Pharmacy      Physical Examination                          Vital Signs: Last                  Vital Signs: 24 Hour Range   BP: 137/54 (07/02 1529)  Temp: 36.7 ???C (98.1 ???F) (07/02 1529)  Pulse: 61 (07/02 1529)  Respirations: 17 PER MINUTE (07/02 1529)  SpO2: 99 % (07/02 1529) BP: (137-173)/(54-86)   Temp:  [36.5 ???C (97.7 ???F)-37 ???C (98.6 ???F)]   Pulse:  [59-61]   Respirations:  [17 PER MINUTE]   SpO2:  [95 %-100 %]      General appearance: alert, oriented, No distress  H&N: no oral lesions/thrush  Lungs: decreased bs bases  Heart: Regular rhythm, reg rate, with no murmur  Chest: sternal dressing in place -dry  Abdomen: soft, non-tender, normoactive bowel sounds  Ext:  1+LE edema  Skin: no rashes    Lines/drains/tubes:  PIV, CTs  Laboratory   Hematology  Recent Labs     07/27/18  0347 07/28/18  0510 07/29/18  0235   WBC 14.6* 12.4* 10.3   HGB 10.1* 10.8* 11.1*   HCT 29.8* 32.0* 31.8*  PLTCT 73* 122* 154     Chemistry  Recent Labs     07/27/18  0347 07/27/18  1821 07/28/18  0510 07/29/18  0235   NA 135*  --  138 139   K 4.4 4.6 4.0 3.8   CL 104  --  106 105   CO2 24  --  25 26   BUN 27*  --  22 21   CR 0.89  --  0.84 0.86   GFR >60  --  >60 >60   GLU 88  --  88 92   CA 8.0*  --  8.0* 7.8*       Radiology and other Diagnostics Review   Pertinent radiology reviewed.    CXR 7/2 trace L>R effusion /atelectasis     Sadie Haber, DO  ID  [id number]

## 2018-07-29 NOTE — Progress Notes
PHYSICAL THERAPY  PROGRESS NOTE        Name: Joel Hernandez        MRN: [id number]          DOB: [birth year]          Age: 78 y.o.  Admission Date: 07/21/2018             LOS: 8 days        Mobility  Progressive Mobility Level: Walk in hallway  Distance Walked (feet): 80 ft  Level of Assistance: Assist X2  Assistive Device: Walker  Time Tolerated: 11-30 minutes  Activity Limited By: Dizziness;Weakness;Fatigue    Subjective  Significant hospital events:  s/p AVR & root replacement today 6/24, PPM 6/26 cardiac arrest 6/27, cardiogenic shock sas taken back to OR for tamponade 6/28  Mental / Cognitive Status: Cooperative;Alert;Follows Commands  Persons Present: RehabTechnician;Daughter  Pain: Patient has no complaint of pain  Comments: Chest tube to wall suction; cleared by RN to clamp for mobility.   Comments: Patient reports being lightheaded since working with cardiac rehab. No change in standing position.   R LE Precautions: (h/o RLE foot drop- no longer wears AFO)  Ambulation Assist: Independent Mobility in [employer] without Device  Patient Owned Equipment: Single [employer] Situation: Lives Alone  Type of Home: House  In-Home Stairs: 1-2 Flights of Stairs(to bedroom)    Bed Mobility/Transfer  Comments: Patient up to chair upon arrival.   Transfer Type: Sit to Stand  Transfer: Assistance Level: From;Bed Side Chair;Moderate Assist;x2 People  Transfer: Assistive Device: None  Transfers: Type Of Assistance: For Strength Deficit;Verbal Cues;Requires Extra Time  Other Transfer Type: Stand to Sit  Other Transfer: Assistance Level: To;Bed Side Chair;Moderate Assist  Other Transfer: Assistive Device: None  Other Transfer: Type Of Assistance: For Safety Considerations;For Strength Deficit;For Balance;Verbal Cues  End Of Activity Status: Up in Chair;Nursing Notified;Instructed Patient to Request Assist with Mobility;Instructed Patient to Use Call Light    Balance Standing Balance: Static Standing Balance;Dynamic Standing Balance;2 UE support;Minimal Assist    Gait  Gait Distance: 80 feet  Gait: Assistance Level: Minimal Assist  Gait: Assistive Device: Roller Walker  Gait: Descriptors: Decreased foot clearance RLE;Decreased foot clearance LLE;Forward trunk flexion;Pace: Slow;Decreased step length;No balance loss  Comments: Shuffle gait with right foot drop. Cues on staying close inside walker. Dizziness did not increase with activity however patient very fatigued.        Education  Persons Educated: Patient/Family  Patient Barriers To Learning: None Noted  Teaching Methods: Verbal Instruction  Patient Response: Verbalized Understanding;More Instruction Required  Topics: Plan/Goals of PT Interventions;Up with Assist Only;Importance of Increasing Activity    Assessment/Progress  Impaired Mobility Due To: Decreased Activity Tolerance;Medical Status Limitation  Assessment/Progress: Should Improve w/ Continued PT  Comments: Patient needing x2 assist to come to stand from standard height chair however once up progressing to x1 assist. Did have 2nd person for safety due to dizziness.    AM-PAC 6 Clicks Basic Mobility Inpatient  Turning from your back to your side while in a flat bed without using bed rails: A Little  Moving from lying on your back to sitting on the side of a flatbed without using bedrails : A Lot  Moving to and from a bed to a chair (including a wheelchair): A Little  Standing up from a chair using your arms (e.g. wheelchair, or bedside chair): A Lot  To walk in hospital room: A Little  Climbing 3-5 steps with a  railing: A Lot  Raw Score: 15  Standardized (T-scale) Score: 36.97  Basic Mobility CMS 0-100%: 50.4  CMS G Code Modifier for Basic Mobility: CK    Goals  Goal Formulation: With Patient  Time For Goal Achievement: 7 days  Patient Will Go Supine To/From Sit: w/ Stand By Assist  Patient Will Transfer Sit to Stand: w/ Stand By Assist Patient Will Ambulate: Greater than 200 Feet, w/ Dan Humphreys, w/ Stand By Assist    Plan  Treatment Interventions: Mobility Training;Balance Activities;Endurance Training  Plan Frequency: 5 Days per Week  PT Plan for Next Visit: work bed mobility and sit to stand transfers while maintaining sternal precautions and progress ambulation     PT Discharge Recommendations  Recommendation: Inpatient setting    Therapist: Particia Jasper  Date: 07/29/2018

## 2018-07-29 NOTE — Progress Notes
No acute events overnight.  A/Ox4. VSS. Tolerating RA.V pacedon tele.   Mild pain managed with Tylenol.  Surgical incisions/dressingsCDI.   Med CT at -20 suction  40 ml output.   Pleural CT at -20 suction 40 ml output.   Foley UOA, last BM7/1.   HFR bundle in place. Pt still having weakness when standing or ambulating, however it is improving.  Call light within reach, will continue to monitor.

## 2018-07-29 NOTE — Progress Notes
OCCUPATIONAL THERAPY  PROGRESS NOTE      Name: Joel Hernandez        MRN: [id number]          DOB: [birth year]          Age: 78 y.o.  Admission Date: 07/21/2018             LOS: 8 days      Mobility  Patient Turn/Position: Weight shifted (Chair)  Progressive Mobility Level: Walk in hallway  Distance Walked (feet): 135 ft(15+120)  Level of Assistance: Assist X1  Assistive Device: Walker  Time Tolerated: 11-30 minutes  Activity Limited By: Weakness    Subjective  Pertinent Dx per Physician: PMH: coronary disease status post PCI on 04/2018, aortic stenosis, hypertension, hyperlipidemia, OSA, hypothyroidism and BPH.  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 s/p AVR and root replacement 6/24. Admitted to CTICU for post operative management. Return to OR 6/27 for hemotama evacuation  Precautions: Falls;Sternal Precautions(two chest tubes (ok to be on waterseal for ambulation))  R LE Precautions: (h/o RLE foot drop- no longer wears AFO)  Pain / Complaints: Patient agrees to participate in therapy  Pain Location: Incisional  Pain Level Current: 4 Moderate pain    Objective  Psychosocial Status: Willing and Cooperative to Participate  Persons Present: Nursing Staff(at beginning of session)    Home Living  Type of Home: House  Home Layout: Two Level  Financial risk analyst / Tub: Tub/Shower Unit  Home Equipment: Cane;Walker    Prior Function  Level Of Independence: Independent with ADLs and functional transfers;Independent with homemaking w/ ambulation  Lives With: Alone  Receives Help From: None Needed  Leisure: Hobbies-Yes (comments)(hunting and fishing)    ADL's  Where Assessed: Chair;In Bathroom  Eating Assist: Stand By Assist  Eating Deficits: Setup;Beverage Management  Grooming Assist: (used wipes to wash hands seated)  Toileting Assist: Moderate Assist  Toileting Deficits: Perineal Hygiene(assist for thoroughness, gown management)    ADL Mobility Transfer Type: Sit to/from stand  Transfer: Assistance Level: To/from;Bedside chair;Toilet;Moderate assist  Transfer: Assistive Device: Agricultural consultant: Type of Assistance: Verbal cues  End of Activity Status: Up in chair;Instructed patient to request assist with mobility;Instructed patient to use call light;Nursing notified  Gait Distance: 135 feet  Gait: Assistance Level: Minimal assist  Gait: Assistive Device: Roller walker  Gait Comments: Cues for upright posture and walker management.    Activity Tolerance  Endurance: 2/5 Tolerates 10-20 Minutes Exercise w/Multiple Rests    Cognition  Overall Cognitive Status: WFL to Adequately Complete Self Care Tasks Safely  Attention: Awake/Alert    Education  Goal Formulation: With Patient    Assessment  Assessment: Decreased ADL Status;Decreased Safe/Judg during ADL;Decreased Endurance;Decreased Self-Care Trans;Decreased High-Level ADLs  Prognosis: Good;w/Cont OT s/p Acute Discharge  Goal Formulation: Patient    AM-PAC 6 Clicks Daily Activity Inpatient  Putting on and taking off regular lower body clothes?: A Lot  Bathing (Including washing, rinsing, drying): A Little  Toileting, which includes using toilet, bedpan, or urinal: A Lot  Putting on and taking off regular upper body clothing: A Little  Taking care of personal grooming such as brushing teeth: None  Eating meals?: None  Daily Activity Raw Score: 18  Standardized (t-scale) score: 38.66  CMS 0-100% Score: 46.65  CMS G Code Modifier: CK    Plan  OT Frequency: 5x/week  OT Plan for Next Visit: grooming routine standing at sink    Further Evaluation  Goals  Pt Will Tolerate Further ADL Evaluation: w/in1-2 sessions    ADL Goals  Patient Will Perform Grooming: Standing at Sink;w/ Stand By Assist  Patient Will Perform LE Dressing: w/ Stand By Assist    Functional Transfer Goals  Pt Will Perform All Functional Transfers: w/ Stand By Assist    OT Discharge Recommendations  Recommendation: Inpatient setting Therapist: Mariana Single, OTR/L [id number]   Date: 07/29/2018

## 2018-07-29 NOTE — Progress Notes
Cardiothoracic Surgery Progress Note     Joel Hernandez  Today's Date:  07/29/2018  Admission Date: 07/21/2018  LOS: 8 days      Procedure: 07/24/18 - EXPLORATION POSTOPERATIVE WOUND - CHEST: 35820 (CPT???) -Hematoma evacuation-07/24/2018      Procedure: 07/23/18 - INSERTION/ REPLACEMENT PERMANENT PACEMAKER WITH ATRIAL AND VENTRICULAR LEAD: 16109 (CPT???)  FLUOROSCOPY - CARDIAC: 60454 (CPT???) 6/26    Procedure: 07/21/18 - Aortic Root replacement (Freestyle)/Implantation of coronary artery buttons, Circ arrest: 33405 (CPT???) 6/24    POD: 8/5    Principal Problem:    S/P AVR (aortic valve replacement) and aortoplasty  Active Problems:    Hypertension    Obstructive sleep apnea    Stenosis of lateral recess of lumbar spine    HLD (hyperlipidemia)    Acute on chronic diastolic CHF (congestive heart failure), NYHA class 2 (HCC)    Hypokalemia    S/p bare metal coronary artery stent    Presence of permanent cardiac pacemaker    Unplanned return to operating room with repeat sternotomy for cardiac tamponade    Subjective: Has incisional discomfort.    Overnight Events: No acute events.      Assessment/Plan:      Neuro ??? A&Ox4. Pain well controlled. Continue prn tylenol, prn lidoderm patches & low dose PRN oxy.   CV ??? PPM in place, V paced 50-60. Epi weaned off 6/29.  Dobutamine off 7/1. Continue bASA, atorvastatin. Start low dose BB today.  Takes metoprolol 25mg  BID and losartan 100mg  daily at home. TTE 6/29: LVEF 75% & hyperdynamic, mild reduce in RV function, grade 3 diastolic dysfunction, mild TR, mild to moderate MR.   Resp ??? Extubated 6/28. Cxr: bibasilar pleural effusions and atelectasis, increase in pulmonary venous congestion. Continue IS, aggressive pulm toilet. Chest tubes in place -L 80cc/24hr, M 160cc/24hr. DC pleural tubes and keep mediastinal tubes today per Dr. Helen Hashimoto.   Renal ??? Serum creatinine 0.86. UOP 4.9L/24hr. Net -4.3 L/24 hrs. Weight +10.5kg/admission.  Give lasix 40 mg IV BID. Continue pta flomax. GI - Continue regular diet. LBM 7/1.    ID ??? Afebrile. WBC 21-->14-->10.3. 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 ID following - appreciate assistance. Pan cx's sent 6/29 - NGTD, continue to follow. Continue doxycycline, vancomycin and unasyn - day 4 today.  Will need PICC line - hold for now.   Heme-Hgb 11.1, platelets 96-->73-->122-->154. Continue to monitor acute blood loss anemia. Continue Plavix for history of DES. Foot pumps/HSQ for DVT prophylaxis.    FEN ??? If creatinine < 2.0, replace Mg and K to minimize cardiac arrhythmias. Hgb A1c 5.2%.  DC MDCF and FSBS.   Activity ???  OOB and up to chair this am. Ambulating 80'.  PT/OT recommend inpatient setting at discharge.  Rehab consult placed.  Await eval.     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 male with pmhx of AS, CAD s/p PCI 04/2018, HTN, HLD, sleep apnea (doesn't need cpap after weight loss), former smoker, hyperthyroid, BPH. He was recently admitted for planned TAVR but it was aborted  r/t a pseudoaneurysm around the aortic annulus (thought to be from previous PCI in April). His eliquis was dc'ed and he was started on plavix. Now s/p AVR & root replacement today 6/24 with Dr. Helen Hashimoto. He was brought to the ICU following the procedure.???  ???  POD 1:  CHB  POD 2:  Dual chamber PPM, CTs discontinued  POD 3:  Tele status, start Metop. Patient transferred to CTP. Patient coded presumable vasovagal while in the restroom. Patient assisted to floor and received 1 round of chest compressions before achieving ROSC. Patient transferred to CTI where he was initially stable. Patient quickly declined hemodynamically requiring emergent intubation, central line and arterial line placedment. Pacer evaluation with proper pacer function. TTE with moderate amount of fluid around heart with decreased function. Patient emergently taken to CVOR for mediastinal exploration. Patient currently recovering postoperatively in CTI.   6/30: dc swan, slow wean dobutamine, lasix 20 IV this am. Dc swan. Continue empiric coverage for endocarditis. Transfer to floor.       REVIEW OF SYSTEMS:   Review of Systems   Constitutional: Negative for chills, fever and malaise/fatigue.   Respiratory: Negative for cough, hemoptysis and sputum production.    Cardiovascular: Positive for leg swelling. Negative for chest pain, orthopnea and claudication.   Gastrointestinal: Negative for blood in stool, heartburn, nausea and vomiting.   Genitourinary: Negative for frequency and hematuria.   Skin: Negative for itching and rash.   Neurological: Negative for dizziness, speech change, weakness and headaches.   Psychiatric/Behavioral: Negative for hallucinations. The patient has insomnia. The patient is not nervous/anxious.        Objective:        Medications:  Scheduled Meds:ampicillin/sulbactam (UNASYN) 3 g in sodium chloride 0.9% (NS) 100 mL IVPB (MB+), 3 g, Intravenous, Q6H*  aspirin chewable tablet 81 mg, 81 mg, Oral, QDAY  atorvastatin (LIPITOR) tablet 40 mg, 40 mg, Oral, QHS  clopiDOGrel (PLAVIX) tablet 75 mg, 75 mg, Oral, QDAY  docusate (COLACE) capsule 100 mg, 100 mg, Oral, BID  doxycycline (VIBRAMYCIN) tablet 100 mg, 100 mg, Oral, BID  furosemide (LASIX) injection 40 mg, 40 mg, Intravenous, BID(9-17)  heparin (porcine) PF syringe 5,000 Units, 5,000 Units, Subcutaneous, Q8H  lidocaine (LIDODERM) 5 % topical patch 1-2 patch, 1-2 patch, Topical, QDAY    And  Verification of Patch Placement and Integrity - Lidocaine 5%, , Transdermal, BID  methIMAzole (TAPAZOLE) tablet 10 mg, 10 mg, Oral, QDAY    And methIMAzole (TAPAZOLE) tablet 15 mg, 15 mg, Oral, QDAY  metoprolol tartrate (LOPRESSOR) tablet 12.5 mg, 12.5 mg, Oral, BID  pneumococcal 23-val vaccine (PPSV23) (PNEUMOVAX 23) injection 0.5 mL, 0.5 mL, Intramuscular, ONCE  polyethylene glycol 3350 (MIRALAX) packet 17 g, 1 packet, Oral, BID  potassium chloride SR (K-DUR) tablet 10 mEq, 10 mEq, Oral, BID w/meals  senna/docusate (SENOKOT-S) tablet 2 tablet, 2 tablet, Oral, BID  tamsulosin (FLOMAX) capsule 0.4 mg, 0.4 mg, Oral, QHS  vancomycin (VANCOCIN) 1,750 mg in sodium chloride 0.9% (NS) IVPB, 1,750 mg, Intravenous, Q12H*    Continuous Infusions:    PRN and Respiratory Meds:acetaminophen Q6H PRN **OR** acetaminophen Q6H PRN, alum/mag hydroxide/simeth Q4H PRN, bisacodyL QDAY PRN, hydrALAZINE Q6H PRN, magnesium chloride PRN **OR** magnesium sulfate PRN (On Call from Rx), milk of magnesia (CONC) QDAY PRN, ondansetron Q6H PRN **OR** ondansetron (ZOFRAN) IV Q6H PRN, oxyCODONE Q4H PRN, potassium chloride SR PRN **OR** potassium chloride PRN **OR** potassium chloride in water PRN, vancomycin, pharmacy to manage Per Pharmacy  Vital Signs: Last Filed                  Vital Signs: 24 Hour Range   BP: 148/86 (07/02 0329)  Temp: 36.9 ???C (98.5 ???F) (07/02 0329)  Pulse: 59 (07/02 0329)  Respirations: 17 PER MINUTE (07/02 0329)  SpO2: 96 % (07/02 0329) BP: (141-171)/(54-86)   Temp:  [36.3 ???C (97.4 ???F)-37 ???C (98.6 ???F)]   Pulse:  [59-62]   Respirations:  [16 PER MINUTE-17 PER MINUTE]   SpO2:  [95 %-100 %]    Intensity Pain Scale (Self Report): 3 (07/28/18 2310) Vitals:    07/28/18 0645 07/28/18 0657 07/29/18 4010   Weight: 103 kg (227 lb 1.2 oz) 103.2 kg (227 lb 9.6 oz) 101.2 kg (223 lb 3.2 oz)           Intake/Output Summary:  (Last 24 hours)    Intake/Output Summary (Last 24 hours) at 07/29/2018 0746  Last data filed at 07/29/2018 0600  Gross per 24 hour   Intake 820 ml   Output 5090 ml   Net -4270 ml         Physical Exam: Neuro: Alert and orientedx4, MAE   Cardiovascular: RRR no rub or murmur  Respiratory: LS CTA bil - diminished in the bases  GI: soft, NT, active BS  Extremities: 1+ peripheral edema  Incisions: Sternal incision dressing, dry and intact. No crepitus or sternal instability.      LABS:  Recent Labs     07/26/18  2019 07/27/18  0347 07/27/18  1821 07/28/18  0510 07/29/18  0235   NA 133* 135*  --  138 139   K 4.4 4.4 4.6 4.0 3.8   CL 103 104  --  106 105   CO2 22 24  --  25 26   GAP 8 7  --  7 8   BUN 27* 27*  --  22 21   CR 0.90 0.89  --  0.84 0.86   GLU 89 88  --  88 92   CA 8.1* 8.0*  --  8.0* 7.8*   MG 1.9 2.3 2.0 2.0 1.7       Recent Labs     07/27/18  0347 07/28/18  0510 07/29/18  0235   WBC 14.6* 12.4* 10.3   HGB 10.1* 10.8* 11.1*   HCT 29.8* 32.0* 31.8*   PLTCT 73* 122* 154      Estimated Creatinine Clearance: 89.9 mL/min (based on SCr of 0.86 mg/dL).  Vitals:    07/28/18 0645 07/28/18 0657 07/29/18 2725   Weight: 103 kg (227 lb 1.2 oz) 103.2 kg (227 lb 9.6 oz) 101.2 kg (223 lb 3.2 oz)      No results for input(s): PHART, PO2ART in the last 72 hours.    Invalid input(s): PC02A        Radiology and Other Diagnostic Procedures Review:    Reviewed

## 2018-07-29 NOTE — Progress Notes
Assumed pt care at 0700. Assessments complete.   A&Ox4. VSS. Tolerating RA. V-paced on tele.   Pain controlled with current regimen.   Adequate UOP post foley removal. Multiple BMs.   Incisions C/D/I. CT to -20 suction. HFR bundle in place.   No other needs expressed at this time. Will continue to monitor.     CT output this shift = 92mL

## 2018-07-29 NOTE — Case Management (ED)
Case Management Progress Note    NAME:Joel Hernandez                          MRN: [id number]              DOB:1940-04-14          AGE: 78 y.o.  ADMISSION DATE: 07/21/2018             DAYS ADMITTED: LOS: 8 days      Today???s Date: 07/29/2018    Plan  Dc planning for SNF level of care at discharge.  Referral sent to [facility] for review.    Per CTS team huddle this am, pt is s/p 8 from aortic root replacement and implantation of coronary artery buttons; s/p 6 from PPM placement; s/p 5 from exploration of post op chest wound and hematoma evacuation.  Nae overnight.  PT/OT continue to recommend inpatient setting.  PMR recommending pt for IPR vs SNF.      Family preferring for pt to dc to SNF level of care.      Interventions  ? Support   Support: Pt/Family Updates re:POC or DC Plan, Patient Education     SW attempted to contact pt in room but was unable to reach him d/t having bedside procedure done.  SW was able to speak with pt's dgt about dc planning needs.  SW discussed current PT/OT recommendations with her and she confirmed she was present during the PMR consult yesterday.  She said that the physician told her and pt that he could dc to [facility]PR if they wanted but his needs could also be met at [facility]  Dgt notified SW that she and pt would prefer for pt to dc to [facility] for next level of care if possible.  SW told dgt she would send the referral today and f/u if there were any concerns.  She thanked SW for same.    Update 1120  SW attempted to reach out to pt's dgt to confirm who would be willing to drive pt to SNF level of care once he was ready for dc.  SW had to leave a vm.    ? Info or Referral   Information or Referral to [employer]: No Needs Identified  ? Discharge Planning   Discharge Planning: Skilled Nursing Facility     SW faxed referral via Epic fax to [facility] for review to determine if they can accept.      Update 1600 SW left vm for DON with facility since she had not received f/u.  SW requested for f/u as soon as they were able.      ? Medication Needs   Medication Needs: No Needs Identified  o Financial   Financial: No Needs Identified  ? Legal   Legal: No Needs Identified  ? Other   Other/None: No needs identified    Disposition  ? Expected Discharge Date    Expected Discharge Date: 08/02/18  Expected Discharge Time: 1400  ? Transportation   Does the patient need discharge transport arranged?: No  Transportation Name, Phone and Availability #1: Son - [name] - [phone]  Does the patient use Medicaid Transportation?: No  ? Next Level of Care (Acute Psych discharges only)      ? Discharge Disposition      Durable Medical Equipment      No service has been selected for the patient.  [place] Destination      No service has been selected for the patient.      [facility]      No service has been selected for the patient.      [facility]      No service has been selected for the patient.        Gweneth Dimitri, LMSW  Surgery - Cardiothoracic/Vascular  Social Work Case Manager  *[phone]

## 2018-07-29 NOTE — Progress Notes
07/29/18 1145   Cardiac Rehab Activity   Distance Walked (feet) 60 ft   BP Pre-activity 164/73   BP Post-activity 184/79   HR Pre-activity 59 bpm   HR Post-activity 66   SaO2 Pre-activity 98 %   SaO2 Post-activity 100   O2 Device None (Room Air)   Comments Pt. Tolerated 28ft with a wheeled walker, on RA. Pts. ambulation was limited by dizziness. Pt. requesting to ambulate back to the chair and not go any farther. Pt. was assisted to the chair at the completion.   Mobility   Progressive Mobility Level 8   Level of Assistance Assist X2   Assistive Device Walker   Time Tolerated 11-30 minutes   Activity Limited By Dizziness;Weakness

## 2018-07-30 ENCOUNTER — Encounter: Admit: 2018-07-30 | Discharge: 2018-07-30

## 2018-07-30 LAB — MAGNESIUM: Lab: 1.6 mg/dL — ABNORMAL LOW (ref >60)

## 2018-07-30 LAB — CBC
Lab: 10 K/UL — ABNORMAL LOW (ref >60)
Lab: 11 g/dL — ABNORMAL LOW (ref >60)

## 2018-07-30 LAB — VANCOMYCIN 2HR POST DOSE: Lab: 90 ug/mL

## 2018-07-30 LAB — VANCOMYCIN TROUGH: Lab: 35 ug/mL — ABNORMAL HIGH (ref 10.0–20.0)

## 2018-07-30 LAB — VANCOMYCIN TIMED LEVEL: Lab: 16 ug/mL — ABNORMAL HIGH (ref 3.5–5.1)

## 2018-07-30 LAB — LIVER FUNCTION PANEL: Lab: 0.8 mg/dL — ABNORMAL LOW (ref 0.3–1.2)

## 2018-07-30 LAB — BASIC METABOLIC PANEL: Lab: 141 MMOL/L — ABNORMAL LOW (ref >60)

## 2018-07-30 MED ORDER — FUROSEMIDE 10 MG/ML IJ SOLN
40 mg | Freq: Three times a day (TID) | INTRAVENOUS | 0 refills | Status: DC
Start: 2018-07-30 — End: 2018-08-01
  Administered 2018-07-30 – 2018-08-01 (×5): 40 mg via INTRAVENOUS

## 2018-07-30 MED ORDER — POTASSIUM CHLORIDE 20 MEQ PO TBTQ
40 meq | Freq: Once | ORAL | 0 refills | Status: CP
Start: 2018-07-30 — End: ?
  Administered 2018-07-30: 20:00:00 40 meq via ORAL

## 2018-07-30 MED ORDER — METOPROLOL TARTRATE 50 MG PO TAB
50 mg | Freq: Two times a day (BID) | ORAL | 0 refills | Status: DC
Start: 2018-07-30 — End: 2018-08-05
  Administered 2018-07-30 – 2018-08-05 (×12): 50 mg via ORAL

## 2018-07-30 MED ORDER — VANCOMYCIN 1,750 MG IVPB
1750 mg | Freq: Two times a day (BID) | INTRAVENOUS | 0 refills | Status: DC
Start: 2018-07-30 — End: 2018-08-01
  Administered 2018-07-30 – 2018-08-01 (×8): 1750 mg via INTRAVENOUS

## 2018-07-30 NOTE — Drug Level
Pharmacy Vancomycin Note  Subjective:   Joel Hernandez is a 78 y.o. 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    Objective:     Current Vancomycin Orders   Medication Dose Route Frequency   ??? vancomycin (VANCOCIN) 1,750 mg in sodium chloride 0.9% (NS) IVPB  1,750 mg Intravenous Q12H*   ??? vancomycin, pharmacy to manage  1 each Service Per Pharmacy     Start Date of  vancomycin therapy: 07/26/2018  Additional Abx: Unasyn, doxycycline  Cultures:  ,  ,  ,    White Blood Cells   Date/Time Value Ref Range Status   07/30/2018 0240 10.2 4.5 - 11.0 K/UL Final   07/29/2018 0235 10.3 4.5 - 11.0 K/UL Final   07/28/2018 0510 12.4 (H) 4.5 - 11.0 K/UL Final     Creatinine   Date/Time Value Ref Range Status   07/30/2018 0240 0.86 0.4 - 1.24 MG/DL Final   96/04/5407 8119 0.86 0.4 - 1.24 MG/DL Final   14/78/2956 2130 0.84 0.4 - 1.24 MG/DL Final     Blood Urea Nitrogen   Date/Time Value Ref Range Status   07/30/2018 0240 19 7 - 25 MG/DL Final     Estimated CrCl:  60mL/min    Intake/Output Summary (Last 24 hours) at 07/30/2018 1721  Last data filed at 07/30/2018 1700  Gross per 24 hour   Intake 1915 ml   Output 4044 ml   Net -2129 ml      UOP:    Actual Weight:  100.1 kg (220 lb 9.6 oz)  Dosing BW:  90.3 kg   Drug Levels:  Vancomycin 2HR POST Dose   Date/Time Value Ref Range Status   07/30/2018 0640 90.7 ug/mL Final     Vancomycin Trough   Date/Time Value Ref Range Status   07/30/2018 0240 35.1 (HH) 10.0 - 20.0 MCG/ML Final     Comment:     CRITICAL VALUE CALLED TO AND READ BACK BY/TIME/TECH  RN L COURTNEY at 07/30/2018 04:12:21 by 753       Vancomycin Random   Date/Time Value Ref Range Status   07/30/2018 1540 16.1 MCG/ML Final       Calculations:  Calculated True Peak (mcg/mL): 21 mcg/mL  Calculated Trough (mcg/mL):  8.8 mcg/mL  Rate of elimination (h-1):  0.07  Half Life (hr):  9.38 hours  Volume of distribution (L/kg): 0.93 L/kg  AUC (mcg*h/mL): 352 mcg*h/mL    Assessment:   Target levels for this patient: 1.  AUC (mcg*h/mL):  400-600    Evaluation of AUC and/or level(s): Levels obtained surrounding 4th dose of new regimen. Appear supratherapeutic but difficult to interpret given degree of difference between levels obtained just 2 days prior.    Plan:   Repeat trough is therapeutic, so will resume patient's dosing of 1750mg  vancomycin IV q12h  1. Next scheduled level(s): Per discretion of clinical day pharmacist  2. Pharmacy will continue to monitor and adjust therapy as needed.    Royetta Crochet, [facility]  07/30/2018

## 2018-07-30 NOTE — Progress Notes
2100: Bladder scan showing 466mL, pt in significant bladder discomfort. Orders obtained for SC, 764mL with small blood clot.     Vpaced, VSS. Afebrile.  Surgical incisions dressed  CTs to single atrium, CO -20. ~25mL SS output  Mild incisional pain controlled on PO Oxycodone/Acetaminophen, see MAR  Gait/strength much improved. Pt up to bathroom with staff x1  Pharmacy managing Vanc, critical trough 7/3  UOP adequate, able to void post-SC  No further questions

## 2018-07-30 NOTE — Progress Notes
Cardiothoracic Surgery Progress Note     Joel Hernandez  Today's Date:  07/30/2018  Admission Date: 07/21/2018  LOS: 9 days      Procedure: 07/24/18 - EXPLORATION POSTOPERATIVE WOUND - CHEST: 35820 (CPT???) -Hematoma evacuation-07/24/2018      Procedure: 07/23/18 - INSERTION/ REPLACEMENT PERMANENT PACEMAKER WITH ATRIAL AND VENTRICULAR LEAD: 33208 (CPT???)  FLUOROSCOPY - CARDIAC: 14782 (CPT???) 6/26    Procedure: 07/21/18 - Aortic Root replacement (Freestyle)/Implantation of coronary artery buttons, Circ arrest: 33405 (CPT???) 6/24    POD: 8/5    Principal Problem:    S/P AVR (aortic valve replacement) and aortoplasty  Active Problems:    Hypertension    Obstructive sleep apnea    Coronary artery disease due to calcified coronary lesion    Stenosis of lateral recess of lumbar spine    HLD (hyperlipidemia)    Acute on chronic diastolic CHF (congestive heart failure), NYHA class 2 (HCC)    Hypokalemia    Hypomagnesemia    S/p bare metal coronary artery stent    Presence of permanent cardiac pacemaker    Unplanned return to operating room with repeat sternotomy for cardiac tamponade    Subjective: Has incisional discomfort.    Overnight Events: No acute events.      Assessment/Plan:      Neuro ??? A&Ox4. Pain well controlled. Continue prn tylenol, prn lidoderm patches & low dose PRN oxy.   CV ??? PPM in place, V paced 50-60. Epi weaned off 6/29.  Dobutamine off 7/1. Continue bASA, atorvastatin. Start low dose BB today.  Takes metoprolol 25mg  BID and losartan 100mg  daily at home. TTE 6/29: LVEF 75% & hyperdynamic, mild reduce in RV function, grade 3 diastolic dysfunction, mild TR, mild to moderate MR.   Resp ??? Extubated 6/28. Cxr: bibasilar pleural effusions and atelectasis, increase in pulmonary venous congestion. Continue IS, aggressive pulm toilet. Chest tubes in place -L 80cc/24hr, M 160cc/24hr. DC pleural tubes and keep mediastinal tubes today per Dr. Helen Hashimoto. Renal ??? Serum creatinine 0.86. UOP 4.9L/24hr. Net -4.3 L/24 hrs. Weight +10.5kg/admission.  Give lasix 40 mg IV BID. Continue pta flomax.   GI - Continue regular diet. LBM 7/1.    ID ??? Afebrile. WBC 21-->14-->10.3. 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 ID following - appreciate assistance. Pan cx's sent 6/29 - NGTD, continue to follow. Continue doxycycline, vancomycin and unasyn - day 4 today.  Will need PICC line - hold for now.   Heme-Hgb 11.1, platelets 96-->73-->122-->154. Continue to monitor acute blood loss anemia. Continue Plavix for history of DES. Foot pumps/HSQ for DVT prophylaxis.    FEN ??? If creatinine < 2.0, replace Mg and K to minimize cardiac arrhythmias. Hgb A1c 5.2%.  DC MDCF and FSBS.   Activity ???  OOB and up to chair this am. Ambulating 80'.  PT/OT recommend inpatient setting at discharge.  Rehab consult placed.  Await eval.     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 On flomax  dc CT  ambulate.   Id on borad  Home vs rehab Sat/Sun  Continue to diurese  Increase lopressor to 50mg      [name], [place]   [id number]  07/30/2018    Subjective:       HPI:   Joel Hernandez is a 78 y.o. male with pmhx of AS,  CAD s/p PCI 04/2018, HTN, HLD, sleep apnea (doesn't need cpap after weight loss), former smoker, hyperthyroid, BPH. 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 His eliquis was dc'ed and he was started on plavix. Now s/p AVR & root replacement today 6/24 with Dr. Helen Hashimoto. He was brought to the ICU following the procedure.???  ???  POD 1:  CHB  POD 2:  Dual chamber PPM, CTs discontinued  POD 3:  Tele status, start Metop. Patient transferred to CTP. Patient coded presumable vasovagal while in the restroom. Patient assisted to floor and received 1 round of chest compressions before achieving ROSC. Patient transferred to CTI where he was initially stable. Patient quickly declined hemodynamically requiring emergent intubation, central line and arterial line placedment. Pacer evaluation with proper pacer function. TTE with moderate amount of fluid around heart with decreased function. Patient emergently taken to CVOR for mediastinal exploration. Patient currently recovering postoperatively in CTI.   6/30: dc swan, slow wean dobutamine, lasix 20 IV this am. Dc swan. Continue empiric coverage for endocarditis. Transfer to floor.       REVIEW OF SYSTEMS:   Review of Systems   Constitutional: Negative for chills, fever and malaise/fatigue.   Respiratory: Negative for cough, hemoptysis and sputum production.    Cardiovascular: Positive for leg swelling. Negative for chest pain, orthopnea and claudication.   Gastrointestinal: Negative for blood in stool, heartburn, nausea and vomiting.   Genitourinary: Negative for frequency and hematuria.   Skin: Negative for itching and rash.   Neurological: Negative for dizziness, speech change, weakness and headaches.   Psychiatric/Behavioral: Negative for hallucinations. The patient has insomnia. The patient is not nervous/anxious.        Objective:        Medications:  Scheduled Meds:ampicillin/sulbactam (UNASYN) 3 g in sodium chloride 0.9% (NS) 100 mL IVPB (MB+), 3 g, Intravenous, Q6H*  aspirin chewable tablet 81 mg, 81 mg, Oral, QDAY  atorvastatin (LIPITOR) tablet 40 mg, 40 mg, Oral, QHS  clopiDOGrel (PLAVIX) tablet 75 mg, 75 mg, Oral, QDAY  docusate (COLACE) capsule 100 mg, 100 mg, Oral, BID  doxycycline (VIBRAMYCIN) tablet 100 mg, 100 mg, Oral, BID  furosemide (LASIX) injection 40 mg, 40 mg, Intravenous, BID(9-17)  heparin (porcine) PF syringe 5,000 Units, 5,000 Units, Subcutaneous, Q8H lidocaine (LIDODERM) 5 % topical patch 1-2 patch, 1-2 patch, Topical, QDAY    And  Verification of Patch Placement and Integrity - Lidocaine 5%, , Transdermal, BID  methIMAzole (TAPAZOLE) tablet 10 mg, 10 mg, Oral, QDAY    And  methIMAzole (TAPAZOLE) tablet 15 mg, 15 mg, Oral, QDAY  metoprolol tartrate (LOPRESSOR) tablet 25 mg, 25 mg, Oral, BID  pneumococcal 23-val vaccine (PPSV23) (PNEUMOVAX 23) injection 0.5 mL, 0.5 mL, Intramuscular, ONCE  polyethylene glycol 3350 (MIRALAX) packet 17 g, 1 packet, Oral, BID  potassium chloride SR (K-DUR) tablet 10 mEq, 10 mEq, Oral, BID w/meals  senna/docusate (SENOKOT-S) tablet 2 tablet, 2 tablet, Oral, BID  tamsulosin (FLOMAX) capsule 0.4 mg, 0.4 mg, Oral, QHS    Continuous Infusions:    PRN and Respiratory Meds:acetaminophen Q6H PRN **OR** acetaminophen Q6H PRN, alum/mag hydroxide/simeth Q4H PRN, bisacodyL QDAY PRN, hydrALAZINE Q6H PRN, magnesium chloride PRN **OR** magnesium sulfate PRN (On Call from Rx), milk of magnesia (CONC) QDAY PRN, ondansetron Q6H PRN **OR** ondansetron (ZOFRAN) IV Q6H PRN, oxyCODONE Q4H PRN, potassium chloride SR PRN **OR** potassium chloride PRN **OR** potassium chloride in  water PRN, vancomycin, pharmacy to manage Per Pharmacy                       Vital Signs: Last Filed                  Vital Signs: 24 Hour Range   BP: 163/67 (07/03 0752)  Temp: 36.4 ???C (97.5 ???F) (07/03 1610)  Pulse: 59 (07/03 0752)  Respirations: 17 PER MINUTE (07/03 0752)  SpO2: 98 % (07/03 0752) BP: (137-192)/(54-84)   Temp:  [36.4 ???C (97.5 ???F)-36.9 ???C (98.4 ???F)]   Pulse:  [59-66]   Respirations:  [17 PER MINUTE-18 PER MINUTE]   SpO2:  [98 %-100 %]    Intensity Pain Scale (Self Report): 6 (07/30/18 0418) Vitals:    07/28/18 0657 07/29/18 9604 07/30/18 0645   Weight: 103.2 kg (227 lb 9.6 oz) 101.2 kg (223 lb 3.2 oz) 100.1 kg (220 lb 9.6 oz)           Intake/Output Summary:  (Last 24 hours)    Intake/Output Summary (Last 24 hours) at 07/30/2018 0858 Last data filed at 07/30/2018 0730  Gross per 24 hour   Intake 1165 ml   Output 3371 ml   Net -2206 ml         Physical Exam:         Neuro: Alert and orientedx4, MAE   Cardiovascular: RRR no rub or murmur  Respiratory: LS CTA bil - diminished in the bases  GI: soft, NT, active BS  Extremities: 1+ peripheral edema  Incisions: Sternal incision dressing, dry and intact. No crepitus or sternal instability.      LABS:  Recent Labs     07/27/18  1821 07/28/18  0510 07/29/18  0235 07/30/18  0240   NA  --  138 139 141   K 4.6 4.0 3.8 3.4*   CL  --  106 105 108   CO2  --  25 26 23    GAP  --  7 8 10    BUN  --  22 21 19    CR  --  0.84 0.86 0.86   GLU  --  88 92 92   CA  --  8.0* 7.8* 7.2*   ALBUMIN  --   --   --  2.5*   MG 2.0 2.0 1.7 1.6       Recent Labs     07/28/18  0510 07/29/18  0235 07/30/18  0240   WBC 12.4* 10.3 10.2   HGB 10.8* 11.1* 11.1*   HCT 32.0* 31.8* 32.5*   PLTCT 122* 154 164   AST  --   --  45*   ALT  --   --  31   ALKPHOS  --   --  210*      Estimated Creatinine Clearance: 89.5 mL/min (based on SCr of 0.86 mg/dL).  Vitals:    07/28/18 0657 07/29/18 0638 07/30/18 0645   Weight: 103.2 kg (227 lb 9.6 oz) 101.2 kg (223 lb 3.2 oz) 100.1 kg (220 lb 9.6 oz)      No results for input(s): PHART, PO2ART in the last 72 hours.    Invalid input(s): PC02A        Radiology and Other Diagnostic Procedures Review:    Reviewed

## 2018-07-30 NOTE — Progress Notes
07/30/18 0936   Cardiac Rehab Activity   Distance Walked (feet) 50 ft  (Goal:  176ft.)   BP Pre-activity 152/72   HR Pre-activity 60 bpm   SaO2 Pre-activity 94 %   O2 Device None (Room Air)   Comments Went directly into the bathroom after ambulation.   Mobility   Progressive Mobility Level 8   Level of Assistance Assist X1   Assistive Device Walker  (Gait belt used)   Time Tolerated 0-10 minutes   Activity Limited By Weakness

## 2018-07-30 NOTE — Drug Level
Pharmacy Vancomycin Note  Subjective:   Joel Hernandez is a 78 y.o. 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    Objective:     Current Vancomycin Orders   Medication Dose Route Frequency   ??? vancomycin, pharmacy to manage  1 each Service Per Pharmacy     Start Date of  vancomycin therapy: 07/26/2018  Additional Abx: doxycycline, Unasyn    White Blood Cells   Date/Time Value Ref Range Status   07/30/2018 0240 10.2 4.5 - 11.0 K/UL Final   07/29/2018 0235 10.3 4.5 - 11.0 K/UL Final   07/28/2018 0510 12.4 (H) 4.5 - 11.0 K/UL Final     Creatinine   Date/Time Value Ref Range Status   07/30/2018 0240 0.86 0.4 - 1.24 MG/DL Final   56/21/3086 5784 0.86 0.4 - 1.24 MG/DL Final   69/62/9528 4132 0.84 0.4 - 1.24 MG/DL Final     Blood Urea Nitrogen   Date/Time Value Ref Range Status   07/30/2018 0240 19 7 - 25 MG/DL Final       Intake/Output Summary (Last 24 hours) at 07/30/2018 1205  Last data filed at 07/30/2018 1044  Gross per 24 hour   Intake 1365 ml   Output 3471 ml   Net -2106 ml      Actual Weight:  100.1 kg (220 lb 9.6 oz)  Dosing BW:  90.3 kg   Drug Levels:  Vancomycin 2HR POST Dose   Date/Time Value Ref Range Status   07/30/2018 0640 90.7 ug/mL Final     Vancomycin Trough   Date/Time Value Ref Range Status   07/30/2018 0240 35.1 (HH) 10.0 - 20.0 MCG/ML Final     Comment:     CRITICAL VALUE CALLED TO AND READ BACK BY/TIME/TECH  RN L COURTNEY at 07/30/2018 04:12:21 by 753         Assessment:   Target levels for this patient:  1.  AUC (mcg*h/mL):  400-600  2.  Trough 10-15      Evaluation of AUC and/or level(s): Levels obtained surrounding 4th dose of new regimen. Appear supratherapeutic but difficult to interpret given degree of difference between levels obtained just 2 days prior.    Plan:   1. Levels obtained for new regimen are supratherapeutic, however without signfiicant clinical changes it is unclear whether these are true values. Will f/u recheck 2. Next scheduled level(s): Will obtain trough level 12h after previous dose was given to confirm accuracy of overnight levels (~1500)  3. Pharmacy will continue to monitor and adjust therapy as needed.     Imogene Burn, [facility]  07/30/2018

## 2018-07-31 LAB — CBC
Lab: 12 K/UL — ABNORMAL HIGH (ref 4.5–11.0)
Lab: 3.6 M/UL — ABNORMAL LOW (ref 4.4–5.5)
Lab: 8.8 FL — ABNORMAL HIGH (ref >60)

## 2018-07-31 LAB — BASIC METABOLIC PANEL
Lab: 138 MMOL/L — ABNORMAL HIGH (ref >60)
Lab: 25 MMOL/L — ABNORMAL LOW (ref >60)

## 2018-07-31 MED ORDER — LOSARTAN 25 MG PO TAB
12.5 mg | Freq: Every day | ORAL | 0 refills | Status: DC
Start: 2018-07-31 — End: 2018-08-01
  Administered 2018-07-31: 13:00:00 12.5 mg via ORAL

## 2018-07-31 NOTE — Progress Notes
07/31/18 0935   Cardiac Rehab Activity   Comments Declined ambulation at this time.  I will offer again later.

## 2018-07-31 NOTE — Progress Notes
No acute events this shift. Assessments completed; see flowsheet.  VSS, Vpaced on tele.  A/Ox4.  Up in room to BR and ambulated in hallway.  UOP adequate.   Reports adequate pain control.  Cont to monitor.

## 2018-07-31 NOTE — Care Plan
Problem: Discharge Planning  Goal: Knowledge regarding plan of care  Outcome: Goal Ongoing  Flowsheets (Taken 07/31/2018 0410)  Knowledge regarding plan of care:   Provide plan of care education   Provide procedural and treatment education   Provide medication management education   Provide infection prevention education  Goal: Prepared for discharge  Outcome: Goal Ongoing     Problem: Infection, Risk of, Urinary Catheter-Associated Urinary Tract Infection  Goal: Absence of urinary catheter-associated infection  Outcome: Goal Ongoing  Flowsheets (Taken 07/31/2018 0410)  Absence of urinary catheter-associated infections:   Manage urinary catheter   Provide patient/family education on CAUTI prevention   Assess for signs and sypmtoms of catheter-associated urinary tract infection     Problem: Skin Integrity  Goal: Healing of skin (Wound & Incision)  Outcome: Goal Ongoing  Flowsheets (Taken 07/31/2018 0410)  Healing of wound (wounds and Incisions):   Assess for signs and symptoms of wound infection   Provide wound/incision care as ordered   Assess wound site healing   Implement wound/incision care as ordered

## 2018-07-31 NOTE — Progress Notes
07/31/18 1335   Cardiac Rehab Activity   Comments Ambulated with RN

## 2018-07-31 NOTE — Progress Notes
Cardiothoracic Surgery Progress Note     Joel Hernandez  Today's Date:  07/31/2018  Admission Date: 07/21/2018  LOS: 10 days      Procedure: 07/24/18 - EXPLORATION POSTOPERATIVE WOUND - CHEST: 35820 (CPT???) -Hematoma evacuation-07/24/2018      Procedure: 07/23/18 - INSERTION/ REPLACEMENT PERMANENT PACEMAKER WITH ATRIAL AND VENTRICULAR LEAD: 33208 (CPT???)  FLUOROSCOPY - CARDIAC: 45409 (CPT???) 6/26    Procedure: 07/21/18 - Aortic Root replacement (Freestyle)/Implantation of coronary artery buttons, Circ arrest: 33405 (CPT???) 6/24    POD: 9/6    Principal Problem:    S/P AVR (aortic valve replacement) and aortoplasty  Active Problems:    Hypertension    Obstructive sleep apnea    Coronary artery disease due to calcified coronary lesion    Stenosis of lateral recess of lumbar spine    HLD (hyperlipidemia)    Acute on chronic diastolic CHF (congestive heart failure), NYHA class 2 (HCC)    Hypokalemia    Hypomagnesemia    S/p bare metal coronary artery stent    Presence of permanent cardiac pacemaker    Unplanned return to operating room with repeat sternotomy for cardiac tamponade    Subjective: Has incisional discomfort.    Overnight Events: foley replaced for retention     Assessment/Plan:      Neuro ??? A&Ox4. Pain well controlled. Continue prn tylenol, prn lidoderm patches & low dose PRN oxy.   CV ??? PPM in place, V paced 50-60. Epi weaned off 6/29.  Dobutamine off 7/1. Continue bASA, atorvastatin. Start low dose BB today.  Takes metoprolol 25mg  BID and losartan 100mg  daily at home. TTE 6/29: LVEF 75% & hyperdynamic, mild reduce in RV function, grade 3 diastolic dysfunction, mild TR, mild to moderate MR.   Resp ??? Extubated 6/28. Cxr: bibasilar pleural effusions and atelectasis, increase in pulmonary venous congestion. Continue IS, aggressive pulm toilet. Chest tubes in place -L 80cc/24hr, M 160cc/24hr. DC pleural tubes and keep mediastinal tubes today per Dr. Helen Hashimoto. Renal ??? Serum creatinine 0.86. UOP 4.9L/24hr. Net -4.3 L/24 hrs. Weight +10.5kg/admission.  Give lasix 40 mg IV BID. Continue pta flomax.   GI - Continue regular diet. LBM 7/1.    ID ??? Afebrile. WBC 21-->14-->10.3. 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 ID following - appreciate assistance. Pan cx's sent 6/29 - NGTD, continue to follow. Continue doxycycline, vancomycin and unasyn - day 4 today.  Will need PICC line - hold for now.   Heme-Hgb 11.1, platelets 96-->73-->122-->154. Continue to monitor acute blood loss anemia. Continue Plavix for history of DES. Foot pumps/HSQ for DVT prophylaxis.    FEN ??? If creatinine < 2.0, replace Mg and K to minimize cardiac arrhythmias. Hgb A1c 5.2%.  DC MDCF and FSBS.   Activity ???  OOB and up to chair this am. Ambulating 80'.  PT/OT recommend inpatient setting at discharge.  Rehab consult placed.  Await eval.     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 On PTA flomax  UOP -4.3L weight +4kgs  WATCH WBC  BC negative  Ambulate  Will need PICC line   Small left effusion- improving.         Joel Hernandez, [place]   [id number]  07/31/2018    Subjective:  HPI:   Joel Hernandez is a 78 y.o. male with pmhx of AS, CAD s/p PCI 04/2018, HTN, HLD, sleep apnea (doesn't need cpap after weight loss), former smoker, hyperthyroid, BPH. 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 His eliquis was dc'ed and he was started on plavix. Now s/p AVR & root replacement today 6/24 with Dr. Helen Hashimoto. He was brought to the ICU following the procedure.???  ???  POD 1:  CHB  POD 2:  Dual chamber PPM, CTs discontinued POD 3:  Tele status, start Metop. Patient transferred to CTP. Patient coded presumable vasovagal while in the restroom. Patient assisted to floor and received 1 round of chest compressions before achieving ROSC. Patient transferred to CTI where he was initially stable. Patient quickly declined hemodynamically requiring emergent intubation, central line and arterial line placedment. Pacer evaluation with proper pacer function. TTE with moderate amount of fluid around heart with decreased function. Patient emergently taken to CVOR for mediastinal exploration. Patient currently recovering postoperatively in CTI.   6/30: dc swan, slow wean dobutamine, lasix 20 IV this am. Dc swan. Continue empiric coverage for endocarditis. Transfer to floor.       REVIEW OF SYSTEMS:   Review of Systems   Constitutional: Negative for chills, fever and malaise/fatigue.   Respiratory: Negative for cough, hemoptysis and sputum production.    Cardiovascular: Positive for leg swelling. Negative for chest pain, orthopnea and claudication.   Gastrointestinal: Negative for blood in stool, heartburn, nausea and vomiting.   Genitourinary: Negative for frequency and hematuria.   Skin: Negative for itching and rash.   Neurological: Negative for dizziness, speech change, weakness and headaches.   Psychiatric/Behavioral: Negative for hallucinations. The patient has insomnia. The patient is not nervous/anxious.        Objective:        Medications:  Scheduled Meds:ampicillin/sulbactam (UNASYN) 3 g in sodium chloride 0.9% (NS) 100 mL IVPB (MB+), 3 g, Intravenous, Q6H*  aspirin chewable tablet 81 mg, 81 mg, Oral, QDAY  atorvastatin (LIPITOR) tablet 40 mg, 40 mg, Oral, QHS  clopiDOGrel (PLAVIX) tablet 75 mg, 75 mg, Oral, QDAY  docusate (COLACE) capsule 100 mg, 100 mg, Oral, BID  doxycycline (VIBRAMYCIN) tablet 100 mg, 100 mg, Oral, BID  furosemide (LASIX) injection 40 mg, 40 mg, Intravenous, TID heparin (porcine) PF syringe 5,000 Units, 5,000 Units, Subcutaneous, Q8H  lidocaine (LIDODERM) 5 % topical patch 1-2 patch, 1-2 patch, Topical, QDAY    And  Verification of Patch Placement and Integrity - Lidocaine 5%, , Transdermal, BID  methIMAzole (TAPAZOLE) tablet 10 mg, 10 mg, Oral, QDAY    And  methIMAzole (TAPAZOLE) tablet 15 mg, 15 mg, Oral, QDAY  metoprolol tartrate (LOPRESSOR) tablet 50 mg, 50 mg, Oral, BID  pneumococcal 23-val vaccine (PPSV23) (PNEUMOVAX 23) injection 0.5 mL, 0.5 mL, Intramuscular, ONCE  polyethylene glycol 3350 (MIRALAX) packet 17 g, 1 packet, Oral, BID  potassium chloride SR (K-DUR) tablet 10 mEq, 10 mEq, Oral, BID w/meals  senna/docusate (SENOKOT-S) tablet 2 tablet, 2 tablet, Oral, BID  tamsulosin (FLOMAX) capsule 0.4 mg, 0.4 mg, Oral, QHS  vancomycin (VANCOCIN) 1,750 mg in sodium chloride 0.9% (NS) IVPB, 1,750 mg, Intravenous, Q12H*    Continuous Infusions:    PRN and Respiratory Meds:acetaminophen Q6H PRN **OR** acetaminophen Q6H PRN, alum/mag hydroxide/simeth Q4H PRN, bisacodyL QDAY PRN, hydrALAZINE Q6H PRN, magnesium chloride PRN **OR** magnesium sulfate PRN (On Call from Rx), milk  of magnesia (CONC) QDAY PRN, ondansetron Q6H PRN **OR** ondansetron (ZOFRAN) IV Q6H PRN, oxyCODONE Q4H PRN, potassium chloride SR PRN **OR** potassium chloride PRN **OR** potassium chloride in water PRN, vancomycin, pharmacy to manage Per Pharmacy                       Vital Signs: Last Filed                  Vital Signs: 24 Hour Range   BP: 142/62 (07/04 0400)  Temp: 36.7 ???C (98 ???F) (07/04 0400)  Pulse: 62 (07/04 0400)  Respirations: 16 PER MINUTE (07/04 0400)  SpO2: 97 % (07/04 0400) BP: (141-163)/(58-82)   Temp:  [36.4 ???C (97.5 ???F)-37.1 ???C (98.7 ???F)]   Pulse:  [59-63]   Respirations:  [16 PER MINUTE-17 PER MINUTE]   SpO2:  [97 %-100 %]    Intensity Pain Scale (Self Report): 2 (07/30/18 1600) Vitals:    07/29/18 8119 07/30/18 0645 07/31/18 0500 Weight: 101.2 kg (223 lb 3.2 oz) 100.1 kg (220 lb 9.6 oz) 97.8 kg (215 lb 9.6 oz)           Intake/Output Summary:  (Last 24 hours)    Intake/Output Summary (Last 24 hours) at 07/31/2018 0738  Last data filed at 07/31/2018 0225  Gross per 24 hour   Intake 950 ml   Output 4345 ml   Net -3395 ml         Physical Exam:         Neuro: Alert and orientedx4, MAE   Cardiovascular: RRR no rub or murmur  Respiratory: LS CTA bil - diminished in the bases  GI: soft, NT, active BS  Extremities: 1+ peripheral edema  Incisions: Sternal incision dressing, dry and intact. No crepitus or sternal instability.      LABS:  Recent Labs     07/29/18  0235 07/30/18  0240 07/31/18  0409   NA 139 141 138   K 3.8 3.4* 3.7   CL 105 108 104   CO2 26 23 25    GAP 8 10 9    BUN 21 19 21    CR 0.86 0.86 0.94   GLU 92 92 118*   CA 7.8* 7.2* 8.1*   ALBUMIN  --  2.5*  --    MG 1.7 1.6 1.5*       Recent Labs     07/29/18  0235 07/30/18  0240 07/31/18  0409   WBC 10.3 10.2 12.0*   HGB 11.1* 11.1* 11.2*   HCT 31.8* 32.5* 33.6*   PLTCT 154 164 196   AST  --  45*  --    ALT  --  31  --    ALKPHOS  --  210*  --       Estimated Creatinine Clearance: 81.1 mL/min (based on SCr of 0.94 mg/dL).  Vitals:    07/29/18 0638 07/30/18 0645 07/31/18 0500   Weight: 101.2 kg (223 lb 3.2 oz) 100.1 kg (220 lb 9.6 oz) 97.8 kg (215 lb 9.6 oz)      No results for input(s): PHART, PO2ART in the last 72 hours.    Invalid input(s): PC02A        Radiology and Other Diagnostic Procedures Review:    Reviewed

## 2018-08-01 ENCOUNTER — Encounter: Admit: 2018-08-01 | Discharge: 2018-08-01

## 2018-08-01 LAB — VANCOMYCIN 2HR POST DOSE: Lab: 43 ug/mL

## 2018-08-01 LAB — CBC
Lab: 32 pg — ABNORMAL LOW (ref >60)
Lab: 34 g/dL — ABNORMAL LOW (ref >60)
Lab: 8.3 K/UL — ABNORMAL LOW (ref >60)

## 2018-08-01 LAB — CULTURE-BLOOD W/SENSITIVITY

## 2018-08-01 LAB — VANCOMYCIN TROUGH: Lab: 28 ug/mL — ABNORMAL HIGH (ref 10.0–20.0)

## 2018-08-01 LAB — POC GLUCOSE
Lab: 105 mg/dL — ABNORMAL HIGH (ref 70–100)
Lab: 102 mg/dL — ABNORMAL HIGH (ref 70–100)
Lab: 96 mg/dL (ref 70–100)

## 2018-08-01 LAB — BASIC METABOLIC PANEL: Lab: 27 MMOL/L — CL (ref >60)

## 2018-08-01 LAB — MAGNESIUM: Lab: 1.5 mg/dL — ABNORMAL LOW (ref 1.6–2.6)

## 2018-08-01 MED ORDER — LOSARTAN 25 MG PO TAB
25 mg | Freq: Every day | ORAL | 0 refills | Status: DC
Start: 2018-08-01 — End: 2018-08-02
  Administered 2018-08-01 – 2018-08-02 (×2): 25 mg via ORAL

## 2018-08-01 MED ORDER — FUROSEMIDE 10 MG/ML IJ SOLN
40 mg | Freq: Two times a day (BID) | INTRAVENOUS | 0 refills | Status: DC
Start: 2018-08-01 — End: 2018-08-03
  Administered 2018-08-01 – 2018-08-03 (×5): 40 mg via INTRAVENOUS

## 2018-08-01 MED ORDER — VANCOMYCIN 1,750 MG IVPB
1750 mg | INTRAVENOUS | 0 refills | Status: DC
Start: 2018-08-01 — End: 2018-08-04
  Administered 2018-08-02 – 2018-08-04 (×6): 1750 mg via INTRAVENOUS

## 2018-08-01 NOTE — Progress Notes
08/01/18 1304   Mobility   Progressive Mobility Level 8   Level of Assistance Assist X1   Assistive Device Walker  (Gait belt used)   Time Tolerated 0-10 minutes   Activity Limited By Patient request to stop

## 2018-08-01 NOTE — Drug Level
Pharmacy Vancomycin Note  Subjective:   Joel Hernandez is a 78 y.o. 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    Objective:     Current Vancomycin Orders   Medication Dose Route Frequency   ??? [START ON 08/02/2018] vancomycin (VANCOCIN) 1,750 mg in sodium chloride 0.9% (NS) IVPB  1,750 mg Intravenous Q24H*   ??? vancomycin, pharmacy to manage  1 each Service Per Pharmacy     Start Date of  vancomycin therapy: 07/26/2018  Additional Abx: Unasyn, doxycycline    White Blood Cells   Date/Time Value Ref Range Status   08/01/2018 0354 8.3 4.5 - 11.0 K/UL Final   07/31/2018 0409 12.0 (H) 4.5 - 11.0 K/UL Final   07/30/2018 0240 10.2 4.5 - 11.0 K/UL Final     Creatinine   Date/Time Value Ref Range Status   08/01/2018 0354 1.09 0.4 - 1.24 MG/DL Final   16/10/9602 5409 0.94 0.4 - 1.24 MG/DL Final   81/19/1478 2956 0.86 0.4 - 1.24 MG/DL Final     Blood Urea Nitrogen   Date/Time Value Ref Range Status   08/01/2018 0354 20 7 - 25 MG/DL Final     Intake/Output Summary (Last 24 hours) at 08/01/2018 1512  Last data filed at 08/01/2018 1330  Gross per 24 hour   Intake 540 ml   Output 6500 ml   Net -5960 ml      Actual Weight:  95.7 kg (211 lb)  Dosing BW:  90.3 kg   Drug Levels:  Vancomycin 2HR POST Dose   Date/Time Value Ref Range Status   08/01/2018 1136 43.8 ug/mL Final     Vancomycin Trough   Date/Time Value Ref Range Status   08/01/2018 0545 28.5 (H) 10.0 - 20.0 MCG/ML Final     Vancomycin Random   Date/Time Value Ref Range Status   07/30/2018 1540 16.1 MCG/ML Final       Calculations:  Calculated True Peak (mcg/mL): 56 mcg/mL  Calculated Trough (mcg/mL):  28 mcg/mL  Rate of elimination (h-1):  0.07  Half Life (hr):  10.2 hours  Volume of distribution (L/kg): 0.55 L/kg  AUC (mcg*h/mL): 983 mcg*h/mL    Assessment:   Target levels for this patient:  1.  AUC (mcg*h/mL):  400-600  2.  Trough 10-15      Evaluation of AUC and/or level(s): Trough obtained at appropriate time, C1 level obtained ~2h later than anticipated. Both levels were obtained via new sticks so would assume they are accurate.    Plan:   1. Will reduce regimen to 1750 mg Q24h. Predicted AUC with this regimen = 491 with trough of ~9 based on updated pharmacokinetics  2. Next scheduled level(s): TBD based on clinical course  3. Pharmacy will continue to monitor and adjust therapy as needed.     Imogene Burn, [facility]  08/01/2018

## 2018-08-01 NOTE — Progress Notes
Patient A&Ox4. VS per trend. Tolerating RA. VP on tele.   Incisions CDI.   Pain controlled with current PO pain regimen.   Denies N / V / dizziness.   Foley to DD, yellow output, clear.   -BM this shift, audible bowel sounds, +flatus, abdomen soft / non-tender.   Completed 3 walks on unit, up to chair majority of day, uses walker, slow but steady gait, requires some reminders regarding sternal precautions, HFR bundle in place.

## 2018-08-01 NOTE — Consults
Urology Consult  08/01/2018     Patient: Joel Hernandez  MRN: [id number]    Date of Service: August 01, 2018    Reason for Consult: urinary retention. On flomax with foley    ASSESSMENT: 78 y.o. 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 Symptoms resemble post-operative urinary retention with underlying BPH.     PLAN:  -No acute urological intervention   -Discussed options of clean-intermittent self-catheterization or indwelling foley catheter. Patient prefers indwelling catheter  -To follow-up with primary care physician for voiding trial in 1-2 weeks   -Symptoms likely to self-resolve  -May pursue outpatient care for further BPH management     Discussed plan of care with staff surgeon, Dr. May, who directed plan of care.    Gabriel Rainwater MS4      UROLOGY RESIDENT ADDENDUM  78 year old male with history of aortic stenosis, CAD s/p PCI (04/2018), HTN, OSA, BPH admitted for aortic valve and root replacement, complicated by postoperative vasovagal/code event POD#3 with echo revealing fluid around the heart requiring mediastinal exploration, now recovering postoperatively with urinary retention.  Urology was consulted as patient failed to void and required multiple straight catheterizations, now with indwelling Foley catheter.  Patient denies history of acute urinary retention apart from one episode of postoperative retention after a cardioversion for atrial fibrillation several years ago.  He reports some nocturia managed with decrease p.m. fluids, hesitancy, mild urgency.  He has been on Flomax for several years and has seen a urologist in [place], [place], however does desire to transfer his care.  He denies history of gross hematuria, urinary tract infections, bladder calculi, nephrolithiasis, elevated PSA, family history of GU cancer.  We discussed the likelihood that his underlying BPH was complicated by his surgical intervention as well as medications and he should presumably be able to void after several days.  We discussed options with him including intermittent catheterization versus indwelling Foley catheter on discharge.  He would like to discharge with a Foley catheter in place and follow-up with his primary care physician for trial of void.  We will get him scheduled follow-up with one of our nurse practitioners to discuss BPH management in the coming weeks, which can hopefully be coordinated with his postoperative appointments with CTS.    ??? Patient elected to discharge with Foley catheter in place for management of postoperative retention  ??? Offered to set up appointment in urology clinic, however for convenience patient would like to follow-up with PCP for cath removal and trial of void within 1 week of discharge; patient remains admitted for several more days and desires trial of void once more, recommend post void residual bladder scans and if less than 200 cc patient may discharge voiding naturally  ??? Continue Flomax  ??? Urology arrange follow-up in clinic in several weeks for BPH management    Janell Quiet, M.D.  Urology PGY-3      __________________________________________________________________________________    HPI: Joel Hernandez is a 78 y.o. 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 Symptoms resemble post-operative urinary retention with underlying BPH.     Patient has been unable to fully void since foley catheter was removed on 7/2.  He reported only a small dribble of urine when attempting to void, and required straight catheterization yielding on 7/2 and 850 mL on 7/3, at which point foley catheter was replaced. Patient has a several year history of increased urinary frequency, urgency, hesitancy, weak stream, and nocturia which improved with prescription of tamsulonsin by his PCP in 2018. He is unsure if he was officially diagnosed with BPH, and has never received urological workup. Denies any other urological or urinary history. He would likely benefit from outpatient urological evaluation, and plans on seeking an outpatient urology appointment after discharged.     ROS:  14 point ROS performed. Pertinent items listed in HPI and PMH. Otherwise negative.    Medical History:   Diagnosis Date   ??? Aortic stenosis    ??? Atrial fibrillation (HCC)    ??? Back pain    ??? CAD (coronary artery disease)    ??? Disorder of thyroid gland    ??? Heart murmur 09/15/2013   ??? Hyperlipidemia    ??? Hypertension 09/15/2013   ??? NSTEMI (non-ST elevated myocardial infarction) (HCC) 12/2015    s/p stent placement   ??? OSA on CPAP     No CPAP use after 55lb weight loss   ??? 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 SCC (squamous cell carcinoma), arm    ??? Severe aortic stenosis    ??? Skin cancer    ??? Sleep apnea      Surgical History:   Procedure Laterality Date   ??? LUMBAR LAMINECTOMY  2004    L4-5   ??? LUMBAR LAMINECTOMY  2011    Hemilaminectomies L 2-3 L3-4   ??? RIGHT LUMBAR 4-5 REDO LAMINECTOMY AND LATERAL RECESS DECOMPRESSION Right 12/04/2016    Performed by Theotis Barrio, MD at [facility] OR   ??? LAMINECTOMY/ FACETECTOMY/ FORAMINOTOMY WITH DECOMPRESSION - 1 VERTEBRAL SEGMENT - LUMBAR  12/04/2016    Performed by Theotis Barrio, MD at [facility] OR   ??? CORONARY STENT PLACEMENT  12/2016   ??? KNEE REPLACEMENT Right 08/2017   ??? ANGIOGRAPHY CORONARY ARTERY WITH LEFT HEART CATHETERIZATION N/A 05/24/2018    Performed by Marcell Barlow, MD at [facility] CATH LAB   ??? POSSIBLE PERCUTANEOUS CORONARY STENT PLACEMENT WITH ANGIOPLASTY N/A 05/24/2018 Performed by Marcell Barlow, MD at [facility] CATH LAB   ??? CLOSURE PSEUDOANEURYSM BY INJECTION- LOWER EXTREMITY N/A 05/26/2018    Performed by Marcell Barlow, MD at [facility] CATH LAB   ??? CORONARY STENT PLACEMENT  05/2018    x2   ??? Aortic Root replacement (Freestyle)/Implantation of coronary artery buttons, Circ arrest N/A 07/21/2018    Performed by Earlene Plater (Trip) L III, MD at [facility] CVOR   ??? INSERTION/ REPLACEMENT PERMANENT PACEMAKER WITH ATRIAL AND VENTRICULAR LEAD Left 07/23/2018    Performed by Lorain Childes, MD at [facility] EP LAB   ??? FLUOROSCOPY - CARDIAC  07/23/2018    Performed by Lorain Childes, MD at [facility] EP LAB   ??? EXPLORATION POSTOPERATIVE WOUND - CHEST N/A 07/24/2018    Performed by Earlene Plater (Trip) L III, MD at [facility] CVOR   ??? COLONOSCOPY     ??? HEART CATHETERIZATION  01/2016, 05/2018    Stent x 1 LAD placement following NSTEMI, 2 stents placed 04/2018   ??? HX HEART CATHETERIZATION     ??? SKIN CANCER EXCISION  2017, 2018    SCC Arm  Medications:  No current facility-administered medications on file prior to encounter.      Current Outpatient Medications on File Prior to Encounter   Medication Sig Dispense Refill   ??? atorvastatin (LIPITOR) 40 mg tablet Take one tablet by mouth daily. 90 tablet 3   ??? clopiDOGrel (PLAVIX) 75 mg tablet Take one tablet by mouth daily. 90 tablet 3   ??? enoxaparin (LOVENOX) 100 mg syringe Inject 1 mL under the skin every 12 hours. 7 each 0   ??? methIMAzole (TAPAZOLE) 10 mg tablet Take  by mouth twice daily. Take 1 tablet in the morning and 1.5 tablets in the evening     ??? metoprolol tartrate (LOPRESSOR) 50 mg tablet Take one-half tablet by mouth twice daily. 360 tablet 3   ??? nitroglycerin (NITROSTAT) 0.4 mg tablet Place one tablet under tongue every 5 minutes as needed for Chest Pain. Max of 3 tablets, call 911. 25 tablet 3   ??? tamsulosin (FLOMAX) 0.4 mg capsule Take 1 capsule by mouth at bedtime daily.       Allergies:  Lisinopril    Social History     Socioeconomic History ??? Marital status: Married     Spouse name: Not on file   ??? Number of children: Not on file   ??? Years of education: Not on file   ??? Highest education level: Not on file   Occupational History   ??? Not on file   Tobacco Use   ??? Smoking status: Former Smoker     Packs/day: 0.50     Years: 15.00     Pack years: 7.50     Types: Cigarettes     Last attempt to quit: 11/20/1969     Years since quitting: 48.7   ??? Smokeless tobacco: Never Used   Substance and Sexual Activity   ??? Alcohol use: No   ??? Drug use: Never   ??? Sexual activity: Not on file   Other Topics Concern   ??? Not on file   Social History Narrative   ??? Not on file     Family History   Problem Relation Age of Onset   ??? Heart Disease Mother          Vitals:  Vital Signs: Last Filed In 24 Hours Vital Signs: 24 Hour Range   BP: 168/68 (07/05 0822)  Temp: 36.8 ???C (98.2 ???F) (07/05 9811)  Pulse: 91 (07/05 0822)  Respirations: 16 PER MINUTE (07/05 0822)  SpO2: 96 % (07/05 0822) BP: (142-168)/(66-78)   Temp:  [36.4 ???C (97.6 ???F)-36.8 ???C (98.2 ???F)]   Pulse:  [78-93]   Respirations:  [16 PER MINUTE]   SpO2:  [96 %-99 %]             Physical Exam:   General: well nourished, alert and oriented, no acute distress  Head/Eyes/Ears/Nose/Throat: normocephalic, atraumatic, extraocular movements intact  Chest: non-labored on room air  Cardiovascular: regular rate/rhythm, palpable symmetric radial pulses  Abdomen: soft, nontender, nondistended  Genito-urinary: Penis and scrotum non-tender with bilaterally descended testicles. No erythema of urethral meatus surrounding indwelling foley catheter.   Extremities:  no clubbing, cyanosis, edema  Neuro: cranial nerves III-XII grossly intact, no focal deficits, moves all extremities  Psych: normal mood, affect, and thought process    Intake/Output:    Intake/Output Summary (Last 24 hours) at 08/01/2018 0852  Last data filed at 08/01/2018 0610  Gross per 24 hour   Intake ???   Output 5200 ml   Net -5200  ml       Lab/Radiology/Other Diagnostic Tests: Recent Labs     07/30/18  0240 07/31/18  0409 08/01/18  0354   HGB 11.1* 11.2* 11.2*   HCT 32.5* 33.6* 32.4*   WBC 10.2 12.0* 8.3   PLTCT 164 196 173   NA 141 138 139   K 3.4* 3.7 3.6   CL 108 104 104   CO2 23 25 27    BUN 19 21 20    CR 0.86 0.94 1.09   GLU 92 118* 85   CA 7.2* 8.1* 8.0*   MG 1.6 1.5* 1.5*   ALBUMIN 2.5*  --   --    TOTPROT 4.6*  --   --    TOTBILI 0.8  --   --    AST 45*  --   --    ALT 31  --   --    ALKPHOS 210*  --   --      Glucose: 85 (08/01/18 0354)  POC Glucose (Download): (!) 105 (08/01/18 1610)

## 2018-08-01 NOTE — Progress Notes
Cardiothoracic Surgery Progress Note     Joel Hernandez  Today's Date:  08/01/2018  Admission Date: 07/21/2018  LOS: 11 days      Procedure: 07/24/18 - EXPLORATION POSTOPERATIVE WOUND - CHEST: 35820 (CPT???) -Hematoma evacuation-07/24/2018      Procedure: 07/23/18 - INSERTION/ REPLACEMENT PERMANENT PACEMAKER WITH ATRIAL AND VENTRICULAR LEAD: 33208 (CPT???)  FLUOROSCOPY - CARDIAC: 16109 (CPT???) 6/26    Procedure: 07/21/18 - Aortic Root replacement (Freestyle)/Implantation of coronary artery buttons, Circ arrest: 33405 (CPT???) 6/24    POD: 10/7    Principal Problem:    S/P AVR (aortic valve replacement) and aortoplasty  Active Problems:    Hypertension    Obstructive sleep apnea    Coronary artery disease due to calcified coronary lesion    Stenosis of lateral recess of lumbar spine    HLD (hyperlipidemia)    Acute on chronic diastolic CHF (congestive heart failure), NYHA class 2 (HCC)    Hypokalemia    Hypomagnesemia    S/p bare metal coronary artery stent    Presence of permanent cardiac pacemaker    Unplanned return to operating room with repeat sternotomy for cardiac tamponade    Subjective: Has incisional discomfort.    Assessment/Plan:      Neuro ??? A&Ox4. Pain well controlled. Continue prn tylenol, prn lidoderm patches & low dose PRN oxy.   CV ??? PPM in place, V paced 50-60. Epi weaned off 6/29.  Dobutamine off 7/1. Continue bASA, atorvastatin. Start low dose BB today.  Takes metoprolol 25mg  BID and losartan 100mg  daily at home. TTE 6/29: LVEF 75% & hyperdynamic, mild reduce in RV function, grade 3 diastolic dysfunction, mild TR, mild to moderate MR.   Resp ??? Extubated 6/28. Cxr: bibasilar pleural effusions and atelectasis, increase in pulmonary venous congestion. Continue IS, aggressive pulm toilet. Chest tubes in place -L 80cc/24hr, M 160cc/24hr. DC pleural tubes and keep mediastinal tubes today per Dr. Helen Hashimoto.   Renal ??? Serum creatinine 0.86. UOP 4.9L/24hr. Net -4.3 L/24 hrs. Weight +10.5kg/admission.  Give lasix 40 mg IV BID. Continue pta flomax.   GI - Continue regular diet. LBM 7/1.    ID ??? Afebrile. WBC 21-->14-->10.3. 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 ID following - appreciate assistance. Pan cx's sent 6/29 - NGTD, continue to follow. Continue doxycycline, vancomycin and unasyn - day 4 today.  Will need PICC line - hold for now.   Heme-Hgb 11.1, platelets 96-->73-->122-->154. Continue to monitor acute blood loss anemia. Continue Plavix for history of DES. Foot pumps/HSQ for DVT prophylaxis.    FEN ??? If creatinine < 2.0, replace Mg and K to minimize cardiac arrhythmias. Hgb A1c 5.2%.  DC MDCF and FSBS.   Activity ???  OOB and up to chair this am. Ambulating 80'.  PT/OT recommend inpatient setting at discharge.  Rehab consult placed.  Await eval.     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 Patient requested urology consult  UOP -5.5L weight +2kgs  WBC normalized   BC negative  Ambulate  Will need PICC line- Monday    Small left effusion- improving.   Rehab Monday       [place], [place]   6045  08/01/2018    Subjective:  HPI:   Joel Hernandez is a 78 y.o. male with pmhx of AS, CAD s/p PCI 04/2018, HTN, HLD, sleep apnea (doesn't need cpap after weight loss), former smoker, hyperthyroid, BPH. 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 His eliquis was dc'ed and he was started on plavix. Now s/p AVR & root replacement today 6/24 with Dr. Helen Hashimoto. He was brought to the ICU following the procedure.???  ???  POD 1:  CHB  POD 2:  Dual chamber PPM, CTs discontinued  POD 3:  Tele status, start Metop. Patient transferred to CTP. Patient coded presumable vasovagal while in the restroom. Patient assisted to floor and received 1 round of chest compressions before achieving ROSC. Patient transferred to CTI where he was initially stable. Patient quickly declined hemodynamically requiring emergent intubation, central line and arterial line placedment. Pacer evaluation with proper pacer function. TTE with moderate amount of fluid around heart with decreased function. Patient emergently taken to CVOR for mediastinal exploration. Patient currently recovering postoperatively in CTI.   6/30: dc swan, slow wean dobutamine, lasix 20 IV this am. Dc swan. Continue empiric coverage for endocarditis. Transfer to floor.       REVIEW OF SYSTEMS:   Review of Systems   Constitutional: Negative for chills, fever and malaise/fatigue.   Respiratory: Negative for cough, hemoptysis and sputum production.    Cardiovascular: Positive for leg swelling. Negative for chest pain, orthopnea and claudication.   Gastrointestinal: Negative for blood in stool, heartburn, nausea and vomiting.   Genitourinary: Negative for frequency and hematuria.   Skin: Negative for itching and rash.   Neurological: Negative for dizziness, speech change, weakness and headaches.   Psychiatric/Behavioral: Negative for hallucinations. The patient has insomnia. The patient is not nervous/anxious.        Objective:        Medications:  Scheduled Meds:ampicillin/sulbactam (UNASYN) 3 g in sodium chloride 0.9% (NS) 100 mL IVPB (MB+), 3 g, Intravenous, Q6H*  aspirin chewable tablet 81 mg, 81 mg, Oral, QDAY  atorvastatin (LIPITOR) tablet 40 mg, 40 mg, Oral, QHS  clopiDOGrel (PLAVIX) tablet 75 mg, 75 mg, Oral, QDAY  docusate (COLACE) capsule 100 mg, 100 mg, Oral, BID  doxycycline (VIBRAMYCIN) tablet 100 mg, 100 mg, Oral, BID  furosemide (LASIX) injection 40 mg, 40 mg, Intravenous, TID  heparin (porcine) PF syringe 5,000 Units, 5,000 Units, Subcutaneous, Q8H lidocaine (LIDODERM) 5 % topical patch 1-2 patch, 1-2 patch, Topical, QDAY    And  Verification of Patch Placement and Integrity - Lidocaine 5%, , Transdermal, BID  losartan (COZAAR) tablet 12.5 mg, 12.5 mg, Oral, QDAY  methIMAzole (TAPAZOLE) tablet 10 mg, 10 mg, Oral, QDAY    And  methIMAzole (TAPAZOLE) tablet 15 mg, 15 mg, Oral, QDAY  metoprolol tartrate (LOPRESSOR) tablet 50 mg, 50 mg, Oral, BID  pneumococcal 23-val vaccine (PPSV23) (PNEUMOVAX 23) injection 0.5 mL, 0.5 mL, Intramuscular, ONCE  polyethylene glycol 3350 (MIRALAX) packet 17 g, 1 packet, Oral, BID  potassium chloride SR (K-DUR) tablet 10 mEq, 10 mEq, Oral, BID w/meals  senna/docusate (SENOKOT-S) tablet 2 tablet, 2 tablet, Oral, BID  tamsulosin (FLOMAX) capsule 0.4 mg, 0.4 mg, Oral, QHS  vancomycin (VANCOCIN) 1,750 mg in sodium chloride 0.9% (NS) IVPB, 1,750 mg, Intravenous, Q12H*    Continuous Infusions:    PRN and Respiratory Meds:acetaminophen Q6H PRN **OR** acetaminophen Q6H PRN, alum/mag hydroxide/simeth Q4H PRN, bisacodyL QDAY PRN, hydrALAZINE Q6H PRN, magnesium  chloride PRN **OR** magnesium sulfate PRN (On Call from Rx), milk of magnesia (CONC) QDAY PRN, ondansetron Q6H PRN **OR** ondansetron (ZOFRAN) IV Q6H PRN, oxyCODONE Q4H PRN, potassium chloride SR PRN **OR** potassium chloride PRN **OR** potassium chloride in water PRN, vancomycin, pharmacy to manage Per Pharmacy                       Vital Signs: Last Filed                  Vital Signs: 24 Hour Range   BP: 152/68 (07/05 0425)  Temp: 36.7 ???C (98.1 ???F) (07/05 0320)  Pulse: 78 (07/05 0320)  Respirations: 16 PER MINUTE (07/05 0320)  SpO2: 97 % (07/05 0320) BP: (142-168)/(66-78)   Temp:  [36.4 ???C (97.6 ???F)-36.7 ???C (98.1 ???F)]   Pulse:  [78-91]   Respirations:  [16 PER MINUTE]   SpO2:  [97 %-99 %]    Intensity Pain Scale (Self Report): 6 (07/31/18 1610) Vitals:    07/30/18 0645 07/31/18 0500 08/01/18 0656   Weight: 100.1 kg (220 lb 9.6 oz) 97.8 kg (215 lb 9.6 oz) 95.7 kg (211 lb) Intake/Output Summary:  (Last 24 hours)    Intake/Output Summary (Last 24 hours) at 08/01/2018 0743  Last data filed at 08/01/2018 0610  Gross per 24 hour   Intake ???   Output 5550 ml   Net -5550 ml         Physical Exam:         Neuro: Alert and orientedx4, MAE   Cardiovascular: RRR no rub or murmur  Respiratory: LS CTA bil - diminished in the bases  GI: soft, NT, active BS  Extremities: 1+ peripheral edema  Incisions: Sternal incision dressing, dry and intact. No crepitus or sternal instability.      LABS:  Recent Labs     07/30/18  0240 07/31/18  0409 08/01/18  0354   NA 141 138 139   K 3.4* 3.7 3.6   CL 108 104 104   CO2 23 25 27    GAP 10 9 8    BUN 19 21 20    CR 0.86 0.94 1.09   GLU 92 118* 85   CA 7.2* 8.1* 8.0*   ALBUMIN 2.5*  --   --    MG 1.6 1.5* 1.5*       Recent Labs     07/30/18  0240 07/31/18  0409 08/01/18  0354   WBC 10.2 12.0* 8.3   HGB 11.1* 11.2* 11.2*   HCT 32.5* 33.6* 32.4*   PLTCT 164 196 173   AST 45*  --   --    ALT 31  --   --    ALKPHOS 210*  --   --       Estimated Creatinine Clearance: 76.8 mL/min (based on SCr of 1.09 mg/dL).  Vitals:    07/30/18 0645 07/31/18 0500 08/01/18 0656   Weight: 100.1 kg (220 lb 9.6 oz) 97.8 kg (215 lb 9.6 oz) 95.7 kg (211 lb)      No results for input(s): PHART, PO2ART in the last 72 hours.    Invalid input(s): PC02A        Radiology and Other Diagnostic Procedures Review:    Reviewed

## 2018-08-01 NOTE — Progress Notes
Pt had an uneventful night.   VSS, V-paced on tele.   Pt denies pain.   Incisions CDI.   Foley in place with adequate output.   Will continue to monitor.

## 2018-08-01 NOTE — Consults
Interventional Radiology Consult Note with Pre-procedural History and Physical      Admission Date: 07/21/2018  LOS: 11 days                       Principal Problem:    S/P AVR (aortic valve replacement) and aortoplasty  Active Problems:    Hypertension    Obstructive sleep apnea    Coronary artery disease due to calcified coronary lesion    Stenosis of lateral recess of lumbar spine    HLD (hyperlipidemia)    Acute on chronic diastolic CHF (congestive heart failure), NYHA class 2 (HCC)    Hypokalemia    Hypomagnesemia    S/p bare metal coronary artery stent    Presence of permanent cardiac pacemaker    Unplanned return to operating room with repeat sternotomy for cardiac tamponade      Reason for consult: PICC for access route for long term abx therapy.     Assessment:   - Admitted with aortic stenosis s/p valve replacement  - Labs, medications, and allergies meet procedural protocol.   - Pt is  on a therapeutic blood thinner, asa 81mg , sc heparin  -   Platelet Count   Date Value Ref Range Status   08/01/2018 173 150 - 400 K/UL Final   ;   INR   Date Value Ref Range Status   07/25/2018 1.4 (H) 0.8 - 1.2 Final       Plan:  - Will proceed with PICC placement on 08/02/18.  - For sedation purposes, please keep NPO prior to procedure.   __________________________________________________________________    Procedure: Non-cuffed double lumen PICC placement    IR Pre Procedure Notes: Consent in pre folder  __________________________________________________________________    Chief Complaint: Aortic valve replacement, post op complication of hematoma development    Previous Anesthetic/Sedation History:  Reviewed.    History of present illness:  Joel Hernandez is a 78 y.o. male patient with hx of aortic stenosis, pseudoaneurysm of aortic annulus, NSTEMI, HTN, CAD, and OSA. IR consulted for line placement. Overall, feels improved. See ROS below for current symptoms    Review of Systems  Constitutional: negative Respiratory: negative  Gastrointestinal: negative    Medications  Scheduled Meds:ampicillin/sulbactam (UNASYN) 3 g in sodium chloride 0.9% (NS) 100 mL IVPB (MB+), 3 g, Intravenous, Q6H*  aspirin chewable tablet 81 mg, 81 mg, Oral, QDAY  atorvastatin (LIPITOR) tablet 40 mg, 40 mg, Oral, QHS  clopiDOGrel (PLAVIX) tablet 75 mg, 75 mg, Oral, QDAY  docusate (COLACE) capsule 100 mg, 100 mg, Oral, BID  doxycycline (VIBRAMYCIN) tablet 100 mg, 100 mg, Oral, BID  furosemide (LASIX) injection 40 mg, 40 mg, Intravenous, BID(9-17)  heparin (porcine) PF syringe 5,000 Units, 5,000 Units, Subcutaneous, Q8H  lidocaine (LIDODERM) 5 % topical patch 1-2 patch, 1-2 patch, Topical, QDAY    And  Verification of Patch Placement and Integrity - Lidocaine 5%, , Transdermal, BID  losartan (COZAAR) tablet 25 mg, 25 mg, Oral, QDAY  methIMAzole (TAPAZOLE) tablet 10 mg, 10 mg, Oral, QDAY    And  methIMAzole (TAPAZOLE) tablet 15 mg, 15 mg, Oral, QDAY  metoprolol tartrate (LOPRESSOR) tablet 50 mg, 50 mg, Oral, BID  pneumococcal 23-val vaccine (PPSV23) (PNEUMOVAX 23) injection 0.5 mL, 0.5 mL, Intramuscular, ONCE  polyethylene glycol 3350 (MIRALAX) packet 17 g, 1 packet, Oral, BID  potassium chloride SR (K-DUR) tablet 10 mEq, 10 mEq, Oral, BID w/meals  senna/docusate (SENOKOT-S) tablet 2 tablet, 2 tablet, Oral, BID  tamsulosin (FLOMAX) capsule 0.4 mg, 0.4 mg, Oral, QHS    Continuous Infusions:  PRN and Respiratory Meds:acetaminophen Q6H PRN **OR** acetaminophen Q6H PRN, alum/mag hydroxide/simeth Q4H PRN, bisacodyL QDAY PRN, hydrALAZINE Q6H PRN, magnesium chloride PRN **OR** magnesium sulfate PRN (On Call from Rx), milk of magnesia (CONC) QDAY PRN, ondansetron Q6H PRN **OR** ondansetron (ZOFRAN) IV Q6H PRN, oxyCODONE Q4H PRN, potassium chloride SR PRN **OR** potassium chloride PRN **OR** potassium chloride in water PRN, vancomycin, pharmacy to manage Per Pharmacy      Objective                       Vital Signs: Last Filed                 Vital Signs: 24 Hour Range   BP: 118/73 (07/05 1134)  Temp: 36.4 ???C (97.5 ???F) (07/05 1134)  Pulse: 95 (07/05 1134)  Respirations: 16 PER MINUTE (07/05 1134)  SpO2: 100 % (07/05 1134) BP: (118-168)/(66-73)   Temp:  [36.4 ???C (97.5 ???F)-36.8 ???C (98.2 ???F)]   Pulse:  [78-95]   Respirations:  [16 PER MINUTE]   SpO2:  [96 %-100 %]    Intensity Pain Scale (Self Report): 0 (08/01/18 1315) Vitals:    07/30/18 0645 07/31/18 0500 08/01/18 0656   Weight: 100.1 kg (220 lb 9.6 oz) 97.8 kg (215 lb 9.6 oz) 95.7 kg (211 lb)         Intake/Output Summary:  (Last 24 hours)    Intake/Output Summary (Last 24 hours) at 08/01/2018 1417  Last data filed at 08/01/2018 1330  Gross per 24 hour   Intake 540 ml   Output 6500 ml   Net -5960 ml      Stool Occurrence: 1    Physical Exam  General appearance: alert and no distress  Neurologic: Grossly normal, at baseline  Lungs: Nonlabored with normal effort  Abdomen: soft, non-tender.   Extremities: extremities normal, atraumatic, no cyanosis or edema       Airway:  airway assessment performed  Mallampati II (soft palate, uvula, fauces visible)   Anesthesia Classification:  ASA III (A patient with a severe systemic disease that limits activity, but is not incapacitating)  Pre procedure anxiolysis plan: NA  Intra-procedural Sedation/Medication Plan: Lidocaine  Personal history of sedation complications: Denies adverse event.   Family history of sedation complications: Denies adverse event.   Medications for Reversal: Naloxone and Flumazenil  Discussion/Reviews:  Physician has discussed risks and alternatives of this type of sedation and above planned procedures with patient  NPO Status: Acceptable   Pregnancy Status: N/A    Lab/Radiology/Other Diagnostic Tests:  Labs:    Hematology:    Lab Results   Component Value Date    HGB 11.2 08/01/2018    HCT 32.4 08/01/2018    PLTCT 173 08/01/2018    WBC 8.3 08/01/2018    NEUT 79 06/09/2018    ANC 6.10 06/09/2018    ALC 0.94 06/09/2018    MONA 6 06/09/2018 AMC 0.44 06/09/2018    ABC 0.07 06/09/2018    MCV 92.5 08/01/2018    MCHC 34.6 08/01/2018    MPV 8.4 08/01/2018    RDW 14.9 08/01/2018   , Coagulation:    Lab Results   Component Value Date    PTT 30.8 07/25/2018    INR 1.4 07/25/2018    and General Chemistry:    Lab Results   Component Value Date    NA 139 08/01/2018  K 3.6 08/01/2018    CL 104 08/01/2018    GAP 8 08/01/2018    BUN 20 08/01/2018    CR 1.09 08/01/2018    GLU 85 08/01/2018    CA 8.0 08/01/2018    ALBUMIN 2.5 07/30/2018    LACTIC 2.6 07/25/2018    OBSCA 1.63 07/25/2018    MG 1.5 08/01/2018    TOTBILI 0.8 07/30/2018     Radiology: Reviewed.         We appreciate being able to participate in this patient's care. Please page with any questions or concerns.    Philmore Pali, APRN-NP   Pgr [phone]    IR Team Pager [phone] (After-hours and Weekends)

## 2018-08-01 NOTE — Progress Notes
08/01/18 0735   Cardiac Rehab Activity   Distance Walked (feet) 150 ft  (Goal:  200 ft.)   BP Pre-activity 167/75   BP Post-activity 173/79   HR Pre-activity 87 bpm   HR Post-activity 90   SaO2 Pre-activity 100 %   SaO2 Post-activity 96   O2 Device None (Room Air)   Mobility   Progressive Mobility Level 8   Level of Assistance Assist X1   Assistive Device Walker  (Gait belt used)   Time Tolerated 0-10 minutes   Activity Limited By Patient request to stop

## 2018-08-02 ENCOUNTER — Encounter: Admit: 2018-08-02 | Discharge: 2018-08-02

## 2018-08-02 DIAGNOSIS — G4733 Obstructive sleep apnea (adult) (pediatric): Secondary | ICD-10-CM

## 2018-08-02 DIAGNOSIS — I350 Nonrheumatic aortic (valve) stenosis: Principal | ICD-10-CM

## 2018-08-02 DIAGNOSIS — R011 Cardiac murmur, unspecified: Secondary | ICD-10-CM

## 2018-08-02 DIAGNOSIS — E785 Hyperlipidemia, unspecified: Secondary | ICD-10-CM

## 2018-08-02 DIAGNOSIS — G4730 Sleep apnea, unspecified: Secondary | ICD-10-CM

## 2018-08-02 DIAGNOSIS — I2510 Atherosclerotic heart disease of native coronary artery without angina pectoris: Secondary | ICD-10-CM

## 2018-08-02 DIAGNOSIS — C44621 Squamous cell carcinoma of skin of unspecified upper limb, including shoulder: Secondary | ICD-10-CM

## 2018-08-02 DIAGNOSIS — I214 Non-ST elevation (NSTEMI) myocardial infarction: Secondary | ICD-10-CM

## 2018-08-02 DIAGNOSIS — I724 Aneurysm of artery of lower extremity: Secondary | ICD-10-CM

## 2018-08-02 DIAGNOSIS — E079 Disorder of thyroid, unspecified: Secondary | ICD-10-CM

## 2018-08-02 DIAGNOSIS — C4490 Unspecified malignant neoplasm of skin, unspecified: Secondary | ICD-10-CM

## 2018-08-02 DIAGNOSIS — M549 Dorsalgia, unspecified: Secondary | ICD-10-CM

## 2018-08-02 DIAGNOSIS — I4891 Unspecified atrial fibrillation: Secondary | ICD-10-CM

## 2018-08-02 DIAGNOSIS — I10 Essential (primary) hypertension: Secondary | ICD-10-CM

## 2018-08-02 LAB — MAGNESIUM: Lab: 1.7 mg/dL — ABNORMAL LOW (ref 1.6–2.6)

## 2018-08-02 MED ORDER — LOSARTAN 25 MG PO TAB
25 mg | Freq: Once | ORAL | 0 refills | Status: CP
Start: 2018-08-02 — End: ?
  Administered 2018-08-02: 18:00:00 25 mg via ORAL

## 2018-08-02 MED ORDER — LOSARTAN 50 MG PO TAB
50 mg | Freq: Every day | ORAL | 0 refills | Status: DC
Start: 2018-08-02 — End: 2018-08-03

## 2018-08-02 NOTE — Progress Notes
PHYSICAL THERAPY  NOTE      Name: Joel Hernandez        MRN: [id number]          DOB: 1940/05/01          Age: 78 y.o.  Admission Date: 07/21/2018             LOS: 12 days      Pt politely declined to participate, requests to rest after returning from IR. Pt ambulated lap around unit with cardiac rehab this AM. Encouraged pt to ambulate in halls at least one more time this date with nursing staff as able. Will continue to follow and provide intervention as indicated.     Therapist: Jonette Mate, PT, DPT  Date: 08/02/2018

## 2018-08-02 NOTE — Progress Notes
Pt BP elevated, refer to eMAR. Talked with NP Clydie Braun who spoke with primary team. Additional losartan ordered, and IR is not stocked with this mediciation.     Pt has reached end of recovery process in IR. I called and spoke to Charge RN on [facility] making sure they felt it safe to transport pt back to [facility] from IR, and that we had been treating the pt's BP. Charge RN okay'd pt's return to [facility].  Pt is asymptomatic.

## 2018-08-02 NOTE — Progress Notes
Patient A&Ox4. VS per trend. Tolerating RA. VP on tele.   Incisions CDI.   No reports of pain for this RN.  Denies N / V / dizziness.   Foley to DD, yellow output, clear.   PICC placed today, pt did not feel up for walking with this RN yet but reports he will this afternoon.

## 2018-08-02 NOTE — Other
Immediate Post Procedure Note    Date:  08/02/2018                                         Performing Provider:  April Manson, MD    Consent:  Consent obtained from patient.  Time out performed: Consent obtained, correct patient verified, correct procedure verified, correct site verified, patient marked as necessary.  Pre/Post Procedure Diagnosis:  infection  Indications:  As above    Anesthesia: Local 5 mL 2% lidocaine without epinephrine  Procedure(s):  Pic line placement  Findings:  Dual lumen 5 fr pic line placed via left brachial vein     Estimated Blood Loss:  None/Negligible  Specimen(s) Removed/Disposition:  None  Complications: None  Patient Tolerated Procedure: Well  Post-Procedure Condition:  stable    April Manson, MD  Pager [phone]

## 2018-08-02 NOTE — Progress Notes
08/02/18 0825   Cardiac Rehab Activity   Distance Walked (feet) 360 ft   BP Pre-activity 156/66   HR Pre-activity 73 bpm   SaO2 Pre-activity 99 %   O2 Device None (Room Air)   Comments Pt tolerated a full lap on RN unit with use of a roller walker and on RA. No rest breaks needed. OT took over once we arrived to room to assist pt with ADLs. Therefore, did not obtain post exercise VS. No other needs at this time.   Mobility   Progressive Mobility Level 8   Level of Assistance Stand by assistance   Assistive Device Walker   Time Tolerated 11-30 minutes   Activity Limited By Fatigue

## 2018-08-02 NOTE — Patient Instructions
INTERVENTIONAL RADIOLOGY DISCHARGE INSTRUCTIONS  PICC (Peripherally Inserted Central Catheter)   A PICC is a type of intravenous access device that is usually inserted into a vein in your upper arm and extends to a large vein near your heart.??? In some cases, it may stay in place for weeks or months.??? A PICC is used to give you medications, fluids, nutrients or blood products and to draw blood.  POST-PROCEDURE ACTIVITY:  ??? A responsible adult must drive you home.??? If you receive sedation, you should not drive or operate heavy machinery or do anything that requires concentration for at least 24 hours.  ??? It is recommended that a responsible adult be with you until morning.  ??? Avoid arm and upper body movements that may pull on the catheter.  ??? Avoid clothing that rubs or pulls on the catheter and do not wear clothing, jewelry or other items that can catch on the catheter.  ??? Never use scissors, pins, or other sharp objects near the catheter.??? Avoid bending or crimping the catheter.????????????  ??? Always wash your hands before you touch the catheter and never touch the open end of the catheter if the cap is off.  ??? If your catheter has a clamp, keep it clamped when you are not using it.  POST-PROCEDURE SITE CARE:  ??? The bandage over the catheter site must be kept clean and dry.??? It will be changed by the healthcare provider using the catheter.???  ??? You may shower after the catheter is placed but you must place a waterproof covering such as plastic wrap over the catheter when showering.  ??? Never submerge the catheter (no tub bathing, swimming/hot tub, etc.)  ??? The dressing and biopatch must be changed every 7 days or as needed if it becomes wet or soiled.??? The injection caps should also be change every 7 days.??? Please refer to your physician, clinic or homecare nurse.  ??? If you are admitted to the hospital, you will be taught to wash with Chlorhexidine (CHG) soap.??? This soap reduces germs on your skin and lowers your risk of infection while in the hospital.??? It will keep harmful germs off your skin for 24 hours, so it is important to use this soap daily.??? The nursing staff will teach you how to shower with this soap.??? If you are unable to shower, they will assist you with using it during a bed bath.??? It is not necessary to continue using this soap at home.  ??? This soap can cause dry skin.??? We recommend using lotion that is compatible with CHG after each bath or shower.??? The nursing staff can provide you with our recommended lotion in the hospital.  DIET/MEDICATIONS:  ??? You may resume your previous diet after the procedure.  ??? Avoid any foods or beverages containing alcohol for at least 24 hours after receiving sedation.  ??? Please see attached Medication Reconciliation Sheet for instructions on resuming your home medications.  CALL THE DOCTOR IF:??????????????????   ??? Bright red blood has soaked the bandage.  ??? You have signs of infection such as:  ???????????????????????????????????? ???-Chills,???fever greater than 101F, body aches,  ??????????????????????????????????????? -Redness, swelling or warmth at the puncture site,  ??????????????????????????????????????????-Red streaks leading from the area or swollen lymph nodes in your neck, armpits, or groin.  ??? You have severe pain at the procedure site, unrelieved by medication.  ??? You have swelling in your face, chest, neck, or arm on the side where the catheter is.  ??? Your catheter is leaking,  cracked or clogged.  ??? You feel resistance when you inject medicine or fluids into your catheter.  ??? Your catheter has moved out of place, has been pulled out or falls out of place.  ??? You have chest pain or shortness of breath.  For any of the above symptoms or for problems or concerns related to the procedure,??? call??? [phone] for Monday-Friday 7-5.??? After-hours and weekends, please call??? [phone] and ask for the Interventional Radiology Resident on-call.

## 2018-08-02 NOTE — Telephone Encounter
-----   Message from Ilean Skill, APRN-NP sent at 07/23/2018  4:25 PM CDT -----  Regarding: One week incision check. Phone call/picture due.  One week incision check. Phone call/picture due.

## 2018-08-02 NOTE — Telephone Encounter
1 week incision check deferred due to curently admitted.

## 2018-08-02 NOTE — Progress Notes
OCCUPATIONAL THERAPY  PROGRESS NOTE      Name: Joel Hernandez        MRN: [id number]          DOB: 05-18-1940          Age: 78 y.o.  Admission Date: 07/21/2018             LOS: 12 days      Mobility  Patient Turn/Position: Chair  Progressive Mobility Level: Walk in room  Distance Walked (feet): 35 ft  Level of Assistance: Stand by assistance  Assistive Device: Walker  Time Tolerated: 11-30 minutes  Activity Limited By: Fatigue    Subjective  Pertinent Dx per Physician: PMH: coronary disease status post PCI on 04/2018, aortic stenosis, hypertension, hyperlipidemia, OSA, hypothyroidism and BPH.  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 s/p AVR and root replacement 6/24. Admitted to CTICU for post operative management. Return to OR 6/27 for hemotama evacuation  Precautions: Falls;Sternal Precautions  Pain / Complaints: Patient agrees to participate in therapy;Patient has no c/o pain    Objective  Psychosocial Status: Willing and Cooperative to Participate  Persons Present: (cardiac rehab at beginning of session)    Home Living  Type of Home: House  Home Layout: Two Level  Financial risk analyst / Tub: Tub/Shower Unit  Home Equipment: Cane;Walker    Prior Function  Level Of Independence: Independent with ADLs and functional transfers;Independent with homemaking w/ ambulation  Lives With: Alone  Receives Help From: None Needed  Leisure: Hobbies-Yes (comments)(hunting and fishing)  Other Function Comments: 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    ADL's  Where Assessed: Chair;Standing at [facility];In [employer] Assist: Stand By Assist  Grooming Deficits: Supervision/Safety;Teeth Care;Brushing Hair  Comment: Upon OT arrival, patient just returning from walking full lap on unit with cardiac rehab and requesting to complete ADLs. Pt completed first half of grooming seated, second half of grooming standing.     ADL Mobility  Transfer Type: Sit to/from stand Transfer: Assistance Level: To/from;Toilet;To;Bedside chair;Standby assist(and chair at sink)  Transfer: Assistive Device: Agricultural consultant: Type of Assistance: For safety considerations  End of Activity Status: Up in chair;Instructed patient to request assist with mobility;Instructed patient to use call light  Transfer Comments: Pt maintains sternal precautions with transfers. Simulated toilet transfer with setup at home.  Standing Balance: Standby assist  Gait Distance: 35 feet  Gait: Assistance Level: Standby assist  Gait: Assistive Device: Roller walker    Activity Tolerance  Endurance: 2/5 Tolerates 10-20 Minutes Exercise w/Multiple Rests    Cognition  Overall Cognitive Status: WFL to Adequately Complete Self Care Tasks Safely  Attention: Awake/Alert    Education  Goal Formulation: With Patient    Assessment  Assessment: Decreased ADL Status;Decreased Safe/Judg during ADL;Decreased Endurance;Decreased Self-Care Trans;Decreased High-Level ADLs  Prognosis: Good;w/ Family  Goal Formulation: Patient     Patient with significant improvement in activity tolerance, functional balance and maintaining sternal precautions this session. Anticipate rapid progress with continued increase in activity.     AM-PAC 6 Clicks Daily Activity Inpatient  Putting on and taking off regular lower body clothes?: A Little  Bathing (Including washing, rinsing, drying): A Little  Toileting, which includes using toilet, bedpan, or urinal: Total(foley)  Putting on and taking off regular upper body clothing: A Little  Taking care of personal grooming such as brushing teeth: None  Eating meals?: None  Daily Activity Raw Score: 18  Standardized (t-scale) score: 38.66  CMS 0-100% Score: 46.65  CMS G Code Modifier: CK    Plan  OT Frequency: 5x/week  OT Plan for Next Visit: grooming routine standing at sink, don pants    Further Evaluation Goals  Pt Will Tolerate Further ADL Evaluation: w/in1-2 sessions, Met    ADL Goals Patient Will Perform Grooming: Standing at Sink;w/ Stand By Assist  Patient Will Perform LE Dressing: w/ Stand By Assist    Functional Transfer Goals  Pt Will Perform All Functional Transfers: w/ Stand By Assist, Met    OT Discharge Recommendations  Recommendation: Home with consistent supervision/assistance (If consistent supervision/assist not available, would recommend inpatient setting at this time. However, patient will likely progress over the next couple of days to only requiring intermittent supervision/assist).  Patient Currently Requires Equipment: Dan Humphreys with wheels    Therapist: Mariana Single, OTR/L [id number]  Date: 08/02/2018

## 2018-08-02 NOTE — Progress Notes
Cardiothoracic Surgery Progress Note     Joel Hernandez  Today's Date:  08/02/2018  Admission Date: 07/21/2018  LOS: 12 days      Procedure: 07/24/18 - EXPLORATION POSTOPERATIVE WOUND - CHEST: 35820 (CPT???) -Hematoma evacuation-07/24/2018      Procedure: 07/23/18 - INSERTION/ REPLACEMENT PERMANENT PACEMAKER WITH ATRIAL AND VENTRICULAR LEAD: 33208 (CPT???)  FLUOROSCOPY - CARDIAC: 98119 (CPT???) 6/26    Procedure: 07/21/18 - Aortic Root replacement (Freestyle)/Implantation of coronary artery buttons, Circ arrest: 33405 (CPT???) 6/24    POD: 11/8    Principal Problem:    S/P AVR (aortic valve replacement) and aortoplasty  Active Problems:    Hypertension    Obstructive sleep apnea    Coronary artery disease due to calcified coronary lesion    Stenosis of lateral recess of lumbar spine    HLD (hyperlipidemia)    Acute on chronic diastolic CHF (congestive heart failure), NYHA class 2 (HCC)    Hypokalemia    Hypomagnesemia    S/p bare metal coronary artery stent    Presence of permanent cardiac pacemaker    Unplanned return to operating room with repeat sternotomy for cardiac tamponade    Subjective: no complaint this morning    Assessment/Plan:      Neuro ??? A&Ox4. Pain well controlled. Continue prn tylenol, prn lidoderm patches & low dose PRN oxy.     CV ??? PPM in place, V paced 74. SBP 110-140s.. Continue bASA, plavix,  atorvastatin, metoprolol 50 bid, cozaar 26 mg, lasix 40 iv bid.   TTE 6/29: LVEF 75% & hyperdynamic, mild reduce in RV function, grade 3 diastolic dysfunction, mild TR, mild to moderate MR.     Resp ??? on RA.  Continue IS, aggressive pulm toilet.     Renal ??? Serum creatinine 0.86-->1.0 BUN 20. UOP 4.5L/24hr. Net -3.2 L/24 hrs. Weight +5kg/admission.  Give lasix 40 mg IV BID. Urology consulted for retention.  Can remove foley in 3 days if still inpatient and check residual. If <200, ok to DC w/o foley. Continue pta flomax.  Hypomag/K- replace    GI - Continue regular diet. LBM 7/4. ID ??? Afebrile. WBC 21-->14-->10.3-->9.9. 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 ID following - appreciate assistance. Pan cx's sent 6/29 - NGTD, continue to follow. Continue doxycycline, vancomycin and unasyn - day 4 today. PICC placement today.     Heme-Hgb 11.1, platelets 96-->73-->122-->154-->193. Continue to monitor acute blood loss anemia. Continue Plavix for history of DES. Foot pumps/HSQ for DVT prophylaxis.      FEN ???  Hgb A1c 5.2%.  DC MDCF and FSBS.     Activity ??? walked with assist x 1. PT/OT recommend inpatient setting at discharge.  Rehab consult placed.     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   Urology- voiding trial in 3 days  Need to f/u with rehab regarding placement.    Anderson Malta, [place]   [id number]  08/02/2018    Subjective:       HPI:   Joel Hernandez is a 78 y.o. male with pmhx of AS, CAD s/p PCI 04/2018, HTN, HLD, sleep apnea (doesn't need cpap after weight loss), former smoker, hyperthyroid, BPH. He was recently admitted for planned TAVR but it was aborted r/t a pseudoaneurysm around  the aortic annulus (thought to be from previous PCI in April). His eliquis was dc'ed and he was started on plavix. Now s/p AVR & root replacement today 6/24 with Dr. Helen Hashimoto. He was brought to the ICU following the procedure.???  ???  POD 1:  CHB  POD 2:  Dual chamber PPM, CTs discontinued  POD 3:  Tele status, start Metop. Patient transferred to CTP. Patient coded presumable vasovagal while in the restroom. Patient assisted to floor and received 1 round of chest compressions before achieving ROSC. Patient transferred to CTI where he was initially stable. Patient quickly declined hemodynamically requiring emergent intubation, central line and arterial line placedment. Pacer evaluation with proper pacer function. TTE with moderate amount of fluid around heart with decreased function. Patient emergently taken to CVOR for mediastinal exploration. Patient currently recovering postoperatively in CTI.   6/30: dc swan, slow wean dobutamine, lasix 20 IV this am. Dc swan. Continue empiric coverage for endocarditis. Transfer to floor.       REVIEW OF SYSTEMS:   Review of Systems   Constitutional: Negative for chills, fever and malaise/fatigue.   Respiratory: Negative for cough, hemoptysis and sputum production.    Cardiovascular: Positive for leg swelling. Negative for chest pain, orthopnea and claudication.   Gastrointestinal: Negative for blood in stool, heartburn, nausea and vomiting.   Genitourinary: Negative for frequency and hematuria.   Skin: Negative for itching and rash.   Neurological: Negative for dizziness, speech change, weakness and headaches.   Psychiatric/Behavioral: Negative for hallucinations. The patient has insomnia. The patient is not nervous/anxious.        Objective:        Medications:  Scheduled Meds:ampicillin/sulbactam (UNASYN) 3 g in sodium chloride 0.9% (NS) 100 mL IVPB (MB+), 3 g, Intravenous, Q6H*  aspirin chewable tablet 81 mg, 81 mg, Oral, QDAY  atorvastatin (LIPITOR) tablet 40 mg, 40 mg, Oral, QHS  clopiDOGrel (PLAVIX) tablet 75 mg, 75 mg, Oral, QDAY  docusate (COLACE) capsule 100 mg, 100 mg, Oral, BID  doxycycline (VIBRAMYCIN) tablet 100 mg, 100 mg, Oral, BID  furosemide (LASIX) injection 40 mg, 40 mg, Intravenous, BID(9-17)  heparin (porcine) PF syringe 5,000 Units, 5,000 Units, Subcutaneous, Q8H  lidocaine (LIDODERM) 5 % topical patch 1-2 patch, 1-2 patch, Topical, QDAY    And  Verification of Patch Placement and Integrity - Lidocaine 5%, , Transdermal, BID  losartan (COZAAR) tablet 25 mg, 25 mg, Oral, QDAY  methIMAzole (TAPAZOLE) tablet 10 mg, 10 mg, Oral, QDAY    And  methIMAzole (TAPAZOLE) tablet 15 mg, 15 mg, Oral, QDAY metoprolol tartrate (LOPRESSOR) tablet 50 mg, 50 mg, Oral, BID  pneumococcal 23-val vaccine (PPSV23) (PNEUMOVAX 23) injection 0.5 mL, 0.5 mL, Intramuscular, ONCE  polyethylene glycol 3350 (MIRALAX) packet 17 g, 1 packet, Oral, BID  potassium chloride SR (K-DUR) tablet 10 mEq, 10 mEq, Oral, BID w/meals  senna/docusate (SENOKOT-S) tablet 2 tablet, 2 tablet, Oral, BID  tamsulosin (FLOMAX) capsule 0.4 mg, 0.4 mg, Oral, QHS  vancomycin (VANCOCIN) 1,750 mg in sodium chloride 0.9% (NS) IVPB, 1,750 mg, Intravenous, Q24H*    Continuous Infusions:    PRN and Respiratory Meds:acetaminophen Q6H PRN **OR** acetaminophen Q6H PRN, alum/mag hydroxide/simeth Q4H PRN, bisacodyL QDAY PRN, hydrALAZINE Q6H PRN, magnesium chloride PRN **OR** magnesium sulfate PRN (On Call from Rx), milk of magnesia (CONC) QDAY PRN, ondansetron Q6H PRN **OR** ondansetron (ZOFRAN) IV Q6H PRN, oxyCODONE Q4H PRN, potassium chloride SR PRN **OR** potassium chloride PRN **OR** potassium chloride in water PRN, vancomycin, pharmacy to manage Per Pharmacy  Vital Signs: Last Filed                  Vital Signs: 24 Hour Range   BP: 145/59 (07/05 2340)  Temp: 36.9 ???C (98.5 ???F) (07/05 2340)  Pulse: 75 (07/05 2340)  Respirations: 18 PER MINUTE (07/05 2340)  SpO2: 97 % (07/05 2340) BP: (117-168)/(59-73)   Temp:  [36.3 ???C (97.3 ???F)-37.1 ???C (98.8 ???F)]   Pulse:  [75-95]   Respirations:  [16 PER MINUTE-18 PER MINUTE]   SpO2:  [96 %-100 %]    Intensity Pain Scale (Self Report): 3 (08/01/18 2038) Vitals:    07/30/18 0645 07/31/18 0500 08/01/18 0656   Weight: 100.1 kg (220 lb 9.6 oz) 97.8 kg (215 lb 9.6 oz) 95.7 kg (211 lb)           Intake/Output Summary:  (Last 24 hours)    Intake/Output Summary (Last 24 hours) at 08/02/2018 0655  Last data filed at 08/02/2018 0654  Gross per 24 hour   Intake 1340 ml   Output 4575 ml   Net -3235 ml         Physical Exam:         Neuro: Alert and orientedx4, MAE   Cardiovascular: RRR no rub or murmur Respiratory: LS CTA bil - diminished in the bases  GI: soft, NT, active BS  Extremities: 1+ peripheral edema  Incisions: Sternal incision with staples in place. No crepitus or sternal instability.      LABS:  Recent Labs     07/31/18  0409 08/01/18  0354 08/02/18  0346   NA 138 139 139   K 3.7 3.6 3.9   CL 104 104 105   CO2 25 27 24    GAP 9 8 10    BUN 21 20 20    CR 0.94 1.09 1.00   GLU 118* 85 90   CA 8.1* 8.0* 8.1*   MG 1.5* 1.5* 1.7       Recent Labs     07/31/18  0409 08/01/18  0354 08/02/18  0346   WBC 12.0* 8.3 9.9   HGB 11.2* 11.2* 11.3*   HCT 33.6* 32.4* 33.3*   PLTCT 196 173 193      Estimated Creatinine Clearance: 83.7 mL/min (based on SCr of 1 mg/dL).  Vitals:    07/30/18 0645 07/31/18 0500 08/01/18 0656   Weight: 100.1 kg (220 lb 9.6 oz) 97.8 kg (215 lb 9.6 oz) 95.7 kg (211 lb)      No results for input(s): PHART, PO2ART in the last 72 hours.    Invalid input(s): PC02A        Radiology and Other Diagnostic Procedures Review:    Reviewed

## 2018-08-02 NOTE — Progress Notes
Notified by IR pts BP running 390'Z-009'Q systolic post hydralazine. [facility] notified. Order for losartan placed.  Will give when pt arrives to unit.

## 2018-08-03 LAB — BASIC METABOLIC PANEL: Lab: 140 MMOL/L — ABNORMAL LOW (ref 137–147)

## 2018-08-03 LAB — CBC
Lab: 195 K/UL — ABNORMAL HIGH (ref >60)
Lab: 8.6 K/UL — ABNORMAL HIGH (ref >60)

## 2018-08-03 MED ORDER — IMS MIXTURE TEMPLATE
75 mg | Freq: Every day | ORAL | 0 refills | Status: DC
Start: 2018-08-03 — End: 2018-08-05
  Administered 2018-08-03 (×2): 75 mg via ORAL

## 2018-08-03 MED ORDER — FUROSEMIDE 40 MG PO TAB
40 mg | Freq: Two times a day (BID) | ORAL | 0 refills | Status: DC
Start: 2018-08-03 — End: 2018-08-05
  Administered 2018-08-03 – 2018-08-05 (×4): 40 mg via ORAL

## 2018-08-03 MED ORDER — FUROSEMIDE 40 MG PO TAB
40 mg | Freq: Two times a day (BID) | ORAL | 0 refills | Status: DC
Start: 2018-08-03 — End: 2018-08-03

## 2018-08-03 NOTE — Progress Notes
Infectious Disease Progress Note    Name:  Joel Hernandez   Today's Date:  08/03/2018  Admission Date: 07/21/2018    Consulted for endocarditis on pathology  ???  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 Darrick Penna), fibrinous exudate along with neutrophilic infiltrates seen on tissue sample from 07/21/18 - special stains negative for microorganisms  -negative: Bartonella, Brucella, Coxiella, Chlamydia, serologies neg, Tropheryma whipplei PCR neg, Broad Range PCR neg (aortic leaflet)  -BC 6/29 x 3 sets NTD  ???  Severe aortic stenosis  Aortic annulus psuedoaneurysm with crescentic hematoma   S/p aortic root replacement, ascending aorta replacement, and repair of aortic annulus pseudoaneurysm 07/21/18  -06/10/18 CTA: The proximal ascending aorta measures 45 mm The descending thoracic aorta measures 25 mm. There is a 2 cm x 2 cm comminuted fluid collection consistent with a perivalvular pseudoaneurysm surrounded by a crescentic hematoma located along the non-coronary cusp of the aortic valve with questionable visualized neck measuring 1mm. No mass effect.  -06/29/18 CTA: Increase in size of a perivalvular pseudoaneurysm posterior to the aortic root, likely arising from the left coronary cusp.  -07/21/18 Underwent aortic root replacement, ascending aorta replacement, and repair of aortic annulus pseudoaneurysm with op note describing a tremendous amount of inflammation posterior to the aortic annulus and extending down onto the dome of the left atrium.  ???  Postoperative mediastinal hematoma status post chest tube placement 07/25/18  ???  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  CAD s/p DES 05/24/18  HTN  OSA    Recommendations: Cultures and microbiological work up remain negative. Will plan to cont empiric atbx.   I discussed at length pt and daughter today. I explained the negative wu but based on findings at surgery, ongoing empiric atbx therapy is warranted. At the time of my discussion pt planning to dc to SNF, we discussed details in that regard. Following that CM indicated possible to dc home, son is EMT trained and feel that good support for home atbx.     1. Continue antibiotic therapy with vancomycin (changed to 1750mg  q 24hr- pharm to dose), amp/sulbactam (3gm IV q 6hr), and doxycycline.  Amp/sulbactrim can be changed to continuous infusion at dc; if unasyn will be a cost/dosing issue can price ertapenem 1 g daily intead  2. Monitor for drug toxicity and side-effects with CBC and CMP  3. At dc weekly cbc, cmp, vanc trough fax to [phone]  4. Counseled to avoid sun exposure with doxycycline - risk for phototoxicity  5. Plan 6 wks IV atbx if tolerates (from 6/24)  6. Follow up Legionella serologies    Complexity of medical decision making is high b/c of the multi-system nature of the infectious disease process and concerns about the complexity of the patient illness including the sensitivity of the organisms being treated, the potential for drug toxicity and interactions, concerns about immunologic function, and interplay of other issues.    Discussed w CTS re dc plans  _____________________________________________________________________________  Interval History  Joel Hernandez is a 78 y.o. male with a PMH significant for aortic stenosis, pseudoaneurysm of aortic annulus, NSTEMI, HTN, CAD, and OSA who [facility] for a planned aortic valve procedure.  No HA  No visual changes  No hearing or balance complaints  No rash  No cough or sob  Sternal wound healing  ROS otherwise negative on 10 pt review    Labs reviewed. Cr stable.   LFT nl 7/3  Cultures remain neg  Bacterial PCR neg Vanco level reviewed- dose adjusted.     Antimicrobial Start date End date   Cefazolin (peri-op) 07/21/18-07/22/18; 07/24/18 07/25/18   Vancomycin IV  07/21/18; 07/26/18 Active   Amp/sulbactam 07/26/18 Active   Doxycycline 07/26/18 Active   ??? ??? ???   ??? ??? ???   ??? ??? ???   Estimated Creatinine Clearance: 83.7 mL/min (based on SCr of 0.95 mg/dL).    Microbiology Review   07/26/18 Blood cx (3 sets): NGTD  07/26/18 Sputum GS/cx: >25 neutrophils/lpf, 10-25 squams/lpf, mod mixed bacteria - nml flora    Medications  Scheduled Meds:ampicillin/sulbactam (UNASYN) 3 g in sodium chloride 0.9% (NS) 100 mL IVPB (MB+), 3 g, Intravenous, Q6H*  aspirin chewable tablet 81 mg, 81 mg, Oral, QDAY  atorvastatin (LIPITOR) tablet 40 mg, 40 mg, Oral, QHS  clopiDOGrel (PLAVIX) tablet 75 mg, 75 mg, Oral, QDAY  docusate (COLACE) capsule 100 mg, 100 mg, Oral, BID  doxycycline (VIBRAMYCIN) tablet 100 mg, 100 mg, Oral, BID  furosemide (LASIX) tablet 40 mg, 40 mg, Oral, BID(9-17)  heparin (porcine) PF syringe 5,000 Units, 5,000 Units, Subcutaneous, Q8H  lidocaine (LIDODERM) 5 % topical patch 1-2 patch, 1-2 patch, Topical, QDAY    And  Verification of Patch Placement and Integrity - Lidocaine 5%, , Transdermal, BID  losartan (COZAAR) tablet 75 mg, 75 mg, Oral, QDAY  methIMAzole (TAPAZOLE) tablet 10 mg, 10 mg, Oral, QDAY    And  methIMAzole (TAPAZOLE) tablet 15 mg, 15 mg, Oral, QDAY  metoprolol tartrate (LOPRESSOR) tablet 50 mg, 50 mg, Oral, BID  pneumococcal 23-val vaccine (PPSV23) (PNEUMOVAX 23) injection 0.5 mL, 0.5 mL, Intramuscular, ONCE  polyethylene glycol 3350 (MIRALAX) packet 17 g, 1 packet, Oral, BID  potassium chloride SR (K-DUR) tablet 10 mEq, 10 mEq, Oral, BID w/meals  senna/docusate (SENOKOT-S) tablet 2 tablet, 2 tablet, Oral, BID  tamsulosin (FLOMAX) capsule 0.4 mg, 0.4 mg, Oral, QHS  vancomycin (VANCOCIN) 1,750 mg in sodium chloride 0.9% (NS) IVPB, 1,750 mg, Intravenous, Q24H*    Continuous Infusions: PRN and Respiratory Meds:acetaminophen Q6H PRN **OR** acetaminophen Q6H PRN, alum/mag hydroxide/simeth Q4H PRN, bisacodyL QDAY PRN, hydrALAZINE Q6H PRN, magnesium chloride PRN **OR** magnesium sulfate PRN (On Call from Rx), milk of magnesia (CONC) QDAY PRN, ondansetron Q6H PRN **OR** ondansetron (ZOFRAN) IV Q6H PRN, oxyCODONE Q4H PRN, potassium chloride SR PRN **OR** potassium chloride PRN **OR** potassium chloride in water PRN, vancomycin, pharmacy to manage Per Pharmacy      Physical Examination                          Vital Signs: Last                  Vital Signs: 24 Hour Range   BP: 143/58 (07/07 0741)  Temp: 36.5 ???C (97.7 ???F) (07/07 0741)  Pulse: 62 (07/07 0741)  Respirations: 18 PER MINUTE (07/07 0741)  SpO2: 97 % (07/07 0741)  SpO2 Pulse: 84 (07/06 1300) BP: (98-196)/(58-113)   Temp:  [36.5 ???C (97.7 ???F)-37.1 ???C (98.7 ???F)]   Pulse:  [62-91]   Respirations:  [18 PER MINUTE-24 PER MINUTE]   SpO2:  [95 %-100 %]      General appearance: alert,  oriented, No distress  H&N: no oral lesions/thrush  Neck: supple  Lungs: decreased bs bases  Heart: Regular rhythm, reg rate, with no murmur  Chest: sternal wound well approximated, no drainage  Abdomen: soft, non-tender, normoactive bowel sounds  Ext:  1+extremity edema  Skin: no rash, ecchymotic lesions on forearms    Lines/drains/tubes:  PIV, CTs  Laboratory   Hematology  Recent Labs     08/01/18  0354 08/02/18  0346 08/03/18  0255   WBC 8.3 9.9 8.6   HGB 11.2* 11.3* 11.5*   HCT 32.4* 33.3* 33.2*   PLTCT 173 193 195     Chemistry  Recent Labs     08/01/18  0354 08/02/18  0346 08/03/18  0255   NA 139 139 140   K 3.6 3.9 3.7   CL 104 105 105   CO2 27 24 27    BUN 20 20 21    CR 1.09 1.00 0.95   GFR >60 >60 >60   GLU 85 90 94   CA 8.0* 8.1* 8.4*       Radiology and other Diagnostics Review   Pertinent radiology reviewed.    CXR 7/3 unchanged L>R effusion /atelectasis     Arn Medal, MD  Beeper 2008

## 2018-08-03 NOTE — Progress Notes
Assumed care at 1900, no acute events this shift.  VSS, V paced on tele. Tolerating RA.   UO adequate this shift via foley catheter, last BM 7/6.  Pain controlled with PRN tylenol.  MS incision dressing CDI, left chest incision with SS.  Fall bundle in place.  Pt slept well, no further needs, will continue to monitor.

## 2018-08-03 NOTE — Progress Notes
OCCUPATIONAL THERAPY  PROGRESS NOTE    Name: Joel Hernandez        MRN: [id number]          DOB: Feb 29, 1940          Age: 78 y.o.  Admission Date: 07/21/2018             LOS: 13 days    Subjective  Pertinent Dx per Physician: PMH: coronary disease status post PCI on 04/2018, aortic stenosis, hypertension, hyperlipidemia, OSA, hypothyroidism and BPH.  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 s/p AVR and root replacement 6/24. Admitted to CTICU for post operative management. Return to OR 6/27 for hemotama evacuation  Precautions: Falls;Sternal Precautions  R LE Precautions: (h/o RLE foot drop- no longer wears AFO)  Pain / Complaints: Patient agrees to participate in therapy;Patient has no c/o pain;Patient demonstrates no signs of pain  Pain Level Current: No pain  Comments: RN cleared patient for therapy session. Patient seated in bedside chair upon OT arrival. Leaving for walk with PT upon OT departure.    Objective  Psychosocial Status: Willing and Cooperative to Participate  Persons Present: Daughter;Physical Therapist(at end of session)    Home Living  Type of Home: House  Home Layout: Two Level  Financial risk analyst / Tub: Tub/Shower Unit  Home Equipment: Cane;Walker    Prior Function  Level Of Independence: Independent with ADLs and functional transfers;Independent with homemaking w/ ambulation  Lives With: Alone  Receives Help From: None Needed  Leisure: Hobbies-Yes (comments)(hunting and fishing)  Other Function Comments: 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    ADL's  Where Assessed: Chair;Standing at [employer] Assist: Stand By Assist  Grooming Deficits: Supervision/Safety;Teeth Care;Wash/Dry Face;Brushing Hair  LE Dressing Assist: Stand By Assist  LE Dressing Deficits: Supervision/Safety;Thread RLE Into Pants;Thread LLE Into Pants;Pull Up Over Hips;Setup  Toileting Assist: Total Assist(Foley)  Comment: Completed grooming standing at sink with close SBA for safety only. 1 verbal cue reminder while patient brushed hair. Donned shorts on with SBA for safety only and set up.     ADL Mobility  Transfer Type: Sit to/from stand  Transfer: Assistance Level: To/from;Bedside chair;Standby assist  Transfer: Assistive Device: Roller walker  Transfer: Type of Assistance: For safety considerations  End of Activity Status: (In care of PT)  Standing Balance: Standby assist  Gait Distance: 15 feet  Gait: Assistance Level: Standby assist  Gait: Assistive Device: Roller walker  Gait Comments: Minimal verbal cues for walker safety    Activity Tolerance  Endurance: 3/5 Tolerates 25-30 Minutes Exercise w/Multiple Rests    Cognition  Attention: Awake/Alert    Education  Goal Formulation: With Patient    Assessment  Assessment: Decreased ADL Status;Decreased Safe/Judg during ADL;Decreased Endurance;Decreased Self-Care Trans;Decreased High-Level ADLs  Prognosis: Good;w/ Family  Goal Formulation: Pt/family  Comments: Patient continues to progress with therapy during acute stay. Patient continues to be limited by fatigue due to recent surgeries.     AM-PAC 6 Clicks Daily Activity Inpatient  Putting on and taking off regular lower body clothes?: A Little  Bathing (Including washing, rinsing, drying): A Little  Toileting, which includes using toilet, bedpan, or urinal: Total(foley)  Putting on and taking off regular upper body clothing: None  Taking care of personal grooming such as brushing teeth: None  Eating meals?: None  Daily Activity Raw Score: 19  Standardized (t-scale) score: 40.22  CMS 0-100% Score: 42.8  CMS G Code Modifier: CK  Plan  OT Frequency: 5x/week  OT Plan for Next Visit: LB dress (socks) w/ AE if needed; toileting    Further Evaluation Goals  Pt Will Tolerate Further ADL Evaluation: w/in1-2 sessions, Met    ADL Goals  Patient Will Perform Grooming: Standing at Sink;w/ Stand By Assist;Met  Patient Will Perform LE Dressing: w/ Stand By Assist;Partly Met    Functional Transfer Goals Pt Will Perform All Functional Transfers: w/ Stand By Assist, Met    OT Discharge Recommendations  Recommendation: Home with consistent supervision/assistance  Patient Currently Requires Physical Assist With: All home functioning ADLs;Bathing;Toileting  Patient Currently Requires Supervision For: ADLs;Mobility  Patient Currently Requires Equipment: Walker with wheels  Comments: Patient has progressed well with therapy during acute stay. Patient now likely safe to discharge home with consistent supervision/assist from family. Per conversation with PT, consistent supervision/assist is available.    Therapist: Honor Loh, OTR/L [id number]  Date: 08/03/2018

## 2018-08-03 NOTE — Progress Notes
PHYSICAL THERAPY  PROGRESS NOTE        Name: Joel Hernandez        MRN: [id number]          DOB: 1940-11-23          Age: 78 y.o.  Admission Date: 07/21/2018             LOS: 13 days        Mobility  Patient Turn/Position: Self  Progressive Mobility Level: Walk in hallway  Distance Walked (feet): 150 ft  Level of Assistance: Stand by assistance  Assistive Device: Walker  Time Tolerated: 11-30 minutes  Activity Limited By: Fatigue    Subjective  Significant hospital events:  s/p AVR & root replacement today 6/24, PPM 6/26 cardiac arrest 6/27, cardiogenic shock sas taken back to OR for tamponade 6/28  Mental / Cognitive Status: Cooperative;Alert;Follows Commands  Persons Present: Daughter;Occupational Therapist(OT present for first 3 minutes of session)  Pain: Patient has no complaint of pain  Precautions: Sternal Precautions  R LE Precautions: (h/o RLE foot drop- no longer wears AFO)  Ambulation Assist: Independent Mobility in [employer] without Device  Patient Owned Equipment: Single [employer] Situation: Lives Alone  Type of Home: House  Entry Stairs: 1-2 Stairs(small thresholds)  In-Home Stairs: 1-2 Flights of Stairs(but does not have to access upon DC)      Bed Mobility/Transfer  Transfer Type: Sit to/from Stand  Transfer: Assistance Level: To/From;Bed Side Chair;Standby Assist  Transfer: Assistive Device: Nurse, adult  Transfers: Type Of Assistance: For Safety Considerations;To Maintain Precautions;Requires Extra Time  End Of Activity Status: Up in Chair;Instructed Patient to Request Assist with Mobility;Instructed Patient to Use Call Light  Comments: Tactile cues provided for sternal precaution adherence with stand to sit (pt instinctively reaches for arm rests of recliner)     Balance  Standing Balance: Dynamic Standing Balance;2 UE support;Standby Assist    Gait  Gait Distance: 150 feet  Gait: Assistance Level: Standby Assist  Gait: Assistive Device: Nurse, adult Gait: Descriptors: Decreased foot clearance RLE;Pace: Slow;Decreased step length  Comments: Pt with R foot drop (chronic) but reports he does not wear AFO to correct.  He is able to compensate with increased R hip flexion and does not catch R foot during gait    Education  Persons Educated: Patient/Family  Patient Barriers To Learning: None Noted  Teaching Methods: Verbal Instruction  Patient Response: Verbalized Understanding  Comments: Time spent at end of session reviewing DC recommendations/DME needs    Assessment/Progress  Impaired Mobility Due To: Decreased Activity Tolerance;Medical Status Limitation;Post Surgical Precautions  Impaired Strength Due To: Deconditioning  Assessment/Progress: Improving as Expected     Pt has made great gains in mobility since time of evaluation.  He is able to perform sit <> stands with SBA-minimal assist (some assist required with fatigue) and ambulate with RW with SBA.  No longer complains of dizziness or lightheadedness with mobility tasks.  Anticipate this will only continue to improve with increased time and mobility training.  He does continue to be limited by fatigue with mobility, which is to be expected given procedure.     AM-PAC 6 Clicks Basic Mobility Inpatient  Turning from your back to your side while in a flat bed without using bed rails: A Little  Moving from lying on your back to sitting on the side of a flatbed without using bedrails : A Little  Moving to and from a bed to a chair (including a wheelchair):  A Little  Standing up from a chair using your arms (e.g. wheelchair, or bedside chair): A Little  To walk in hospital room: A Little  Climbing 3-5 steps with a railing: A Little  Raw Score: 18  Standardized (T-scale) Score: 41.05  Basic Mobility CMS 0-100%: 40.47  CMS G Code Modifier for Basic Mobility: CK    Goals  Goal Formulation: With Patient  Time For Goal Achievement: 7 days  Patient Will Go Supine To/From Sit: w/ Stand By Assist, Ongoing Patient Will Transfer Sit to Stand: w/ Stand By Assist, Ongoing  Patient Will Ambulate: Greater than 200 Feet, w/ Walker, w/ Stand By Assist, Ongoing    Plan  Treatment Interventions: Mobility Training;Balance Activities;Endurance Training  Plan Frequency: 5 Days per Week  PT Plan for Next Visit: bed mobility, sit <> stands with sternal precaution adherence (consider 5x sit <> stand assessment if pt is able), gait in hallways with walker, family training if needed prior to DC    PT Discharge Recommendations  Recommendation: Home with consistent supervision/assistance  Recommendation for Therapy Post Discharge: Home health  Patient Currently Requires Physical Assist With: (intermittent assist for sit <> stands)  Patient Currently Requires Supervision For: Mobility  Patient Currently Requires Equipment: Owns what is needed     Pt has progressed with therapy during his acute stay.  He is now safe to DC back to home with consistent supervision/assist of family.  Per family report today, multiple children are available to come stay with pt at DC and it is pt/family wish to return back to pt's home at DC. They are in agreement to provide necessary assistance and are motivated by pt's progress with mobility.    Therapist: Chanetta Marshall, PT  Date: 08/03/2018

## 2018-08-03 NOTE — Progress Notes
Cardiothoracic Surgery Progress Note     Joel Hernandez  Today's Date:  08/03/2018  Admission Date: 07/21/2018  LOS: 13 days      Procedure: 07/24/18 - EXPLORATION POSTOPERATIVE WOUND - CHEST: 35820 (CPT???) -Hematoma evacuation-07/24/2018      Procedure: 07/23/18 - INSERTION/ REPLACEMENT PERMANENT PACEMAKER WITH ATRIAL AND VENTRICULAR LEAD: 33208 (CPT???)  FLUOROSCOPY - CARDIAC: 14782 (CPT???) 6/26    Procedure: 07/21/18 - Aortic Root replacement (Freestyle)/Implantation of coronary artery buttons, Circ arrest: 33405 (CPT???) 6/24    POD: 12/9    Principal Problem:    S/P AVR (aortic valve replacement) and aortoplasty  Active Problems:    Hypertension    Obstructive sleep apnea    Coronary artery disease due to calcified coronary lesion    Stenosis of lateral recess of lumbar spine    HLD (hyperlipidemia)    Acute on chronic diastolic CHF (congestive heart failure), NYHA class 2 (HCC)    Hypokalemia    Hypomagnesemia    S/p bare metal coronary artery stent    Presence of permanent cardiac pacemaker    Unplanned return to operating room with repeat sternotomy for cardiac tamponade    Subjective: no complaint this morning    No acute events overnight.    Assessment/Plan:      Neuro ??? A&Ox4. Pain well controlled. Continue prn tylenol, prn lidoderm patches & low dose PRN oxy.     CV ??? PPM in place, V paced 74. SBP 140-170s. Continue bASA, plavix,  atorvastatin, metoprolol 50 bid, cozaar 50 mg, lasix 40 iv bid.   TTE 6/29: LVEF 75% & hyperdynamic, mild reduce in RV function, grade 3 diastolic dysfunction, mild TR, mild to moderate MR.     Resp ??? on RA.  Continue IS, aggressive pulm toilet.     Renal ??? Serum creatinine 0.86-->1.0-->0.95 BUN 21. UOP 5.2L/24hr. Now at POW. Cont lasix 40 mg IV BID. Urology consulted for retention.  Can proceed with voiding trial tomorrow if still inpatient and check residual. If <200, ok to DC w/o foley. Continue pta flomax.  Hypomag/K- replace    GI - Continue regular diet. LBM 7/4. ID ??? Afebrile. WBC 21-->14-->10.3-->9.9-->8.6. 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 ID following - appreciate assistance. Blood cultures 6/29- negative. Sputum culture negative.  Continue doxycycline, vancomycin and unasyn., PICC placed 7/6     Heme-Hgb 11.5, platelets 195. Continue to monitor acute blood loss anemia. Continue Plavix for history of DES. Foot pumps/HSQ for DVT prophylaxis.      FEN ???  Hgb A1c 5.2%.  DC MDCF and FSBS.     Activity ??? walked 360 feet yesterday.    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  SW working on SNF placement      Anderson Malta, [place]   [id number]  08/03/2018    Subjective:       HPI:   Joel Hernandez is a 78 y.o. male with pmhx of AS, CAD s/p PCI 04/2018, HTN, HLD, sleep apnea (doesn't need cpap after weight loss), former smoker, hyperthyroid, BPH. 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 His eliquis was dc'ed and he  was started on plavix. Now s/p AVR & root replacement today 6/24 with Dr. Helen Hashimoto. He was brought to the ICU following the procedure.???  ???  POD 1:  CHB  POD 2:  Dual chamber PPM, CTs discontinued  POD 3:  Tele status, start Metop. Patient transferred to CTP. Patient coded presumable vasovagal while in the restroom. Patient assisted to floor and received 1 round of chest compressions before achieving ROSC. Patient transferred to CTI where he was initially stable. Patient quickly declined hemodynamically requiring emergent intubation, central line and arterial line placedment. Pacer evaluation with proper pacer function. TTE with moderate amount of fluid around heart with decreased function. Patient emergently taken to CVOR for mediastinal exploration. Patient currently recovering postoperatively in CTI.   6/30: dc swan, slow wean dobutamine, lasix 20 IV this am. Dc swan. Continue empiric coverage for endocarditis. Transfer to floor.       REVIEW OF SYSTEMS:   Review of Systems   Constitutional: Negative for chills, fever and malaise/fatigue.   Respiratory: Negative for cough, hemoptysis and sputum production.    Cardiovascular: Positive for leg swelling. Negative for chest pain, orthopnea and claudication.   Gastrointestinal: Negative for blood in stool, heartburn, nausea and vomiting.   Genitourinary: Negative for frequency and hematuria.   Skin: Negative for itching and rash.   Neurological: Negative for dizziness, speech change, weakness and headaches.   Psychiatric/Behavioral: Negative for hallucinations. The patient has insomnia. The patient is not nervous/anxious.        Objective:        Medications:  Scheduled Meds:ampicillin/sulbactam (UNASYN) 3 g in sodium chloride 0.9% (NS) 100 mL IVPB (MB+), 3 g, Intravenous, Q6H*  aspirin chewable tablet 81 mg, 81 mg, Oral, QDAY  atorvastatin (LIPITOR) tablet 40 mg, 40 mg, Oral, QHS  clopiDOGrel (PLAVIX) tablet 75 mg, 75 mg, Oral, QDAY  docusate (COLACE) capsule 100 mg, 100 mg, Oral, BID  doxycycline (VIBRAMYCIN) tablet 100 mg, 100 mg, Oral, BID  furosemide (LASIX) injection 40 mg, 40 mg, Intravenous, BID(9-17)  heparin (porcine) PF syringe 5,000 Units, 5,000 Units, Subcutaneous, Q8H  lidocaine (LIDODERM) 5 % topical patch 1-2 patch, 1-2 patch, Topical, QDAY    And  Verification of Patch Placement and Integrity - Lidocaine 5%, , Transdermal, BID  losartan (COZAAR) tablet 75 mg, 75 mg, Oral, QDAY  methIMAzole (TAPAZOLE) tablet 10 mg, 10 mg, Oral, QDAY    And  methIMAzole (TAPAZOLE) tablet 15 mg, 15 mg, Oral, QDAY  metoprolol tartrate (LOPRESSOR) tablet 50 mg, 50 mg, Oral, BID  pneumococcal 23-val vaccine (PPSV23) (PNEUMOVAX 23) injection 0.5 mL, 0.5 mL, Intramuscular, ONCE  polyethylene glycol 3350 (MIRALAX) packet 17 g, 1 packet, Oral, BID  potassium chloride SR (K-DUR) tablet 10 mEq, 10 mEq, Oral, BID w/meals  senna/docusate (SENOKOT-S) tablet 2 tablet, 2 tablet, Oral, BID  tamsulosin (FLOMAX) capsule 0.4 mg, 0.4 mg, Oral, QHS  vancomycin (VANCOCIN) 1,750 mg in sodium chloride 0.9% (NS) IVPB, 1,750 mg, Intravenous, Q24H*    Continuous Infusions:    PRN and Respiratory Meds:acetaminophen Q6H PRN **OR** acetaminophen Q6H PRN, alum/mag hydroxide/simeth Q4H PRN, bisacodyL QDAY PRN, hydrALAZINE Q6H PRN, magnesium chloride PRN **OR** magnesium sulfate PRN (On Call from Rx), milk of magnesia (CONC) QDAY PRN, ondansetron Q6H PRN **OR** ondansetron (ZOFRAN) IV Q6H PRN, oxyCODONE Q4H PRN, potassium chloride SR PRN **OR** potassium chloride PRN **OR** potassium chloride in water PRN, vancomycin, pharmacy to manage Per Pharmacy  Vital Signs: Last Filed                  Vital Signs: 24 Hour Range   BP: 156/74 (07/07 0421)  Temp: 37.1 ???C (98.7 ???F) (07/07 0421)  Pulse: 82 (07/07 0421)  Respirations: 18 PER MINUTE (07/07 0421)  SpO2: 95 % (07/07 0421)  SpO2 Pulse: 84 (07/06 1300) BP: (98-196)/(59-113)   Temp:  [36.6 ???C (97.8 ???F)-37.1 ???C (98.7 ???F)]   Pulse:  [65-91]   Respirations:  [18 PER MINUTE-24 PER MINUTE]   SpO2:  [95 %-100 %]    Intensity Pain Scale (Self Report): 4 (08/02/18 2300) Vitals:    07/31/18 0500 08/01/18 0656 08/03/18 0626   Weight: 97.8 kg (215 lb 9.6 oz) 95.7 kg (211 lb) 90.9 kg (200 lb 6.4 oz)           Intake/Output Summary:  (Last 24 hours)    Intake/Output Summary (Last 24 hours) at 08/03/2018 0865  Last data filed at 08/03/2018 0300  Gross per 24 hour   Intake 450 ml   Output 5225 ml   Net -4775 ml         Physical Exam:         Neuro: Alert and orientedx4, MAE   Cardiovascular: RRR no rub or murmur  Respiratory: LS CTA bil - diminished in the bases  GI: soft, NT, active BS  Extremities: 1+ peripheral edema Incisions: Sternal incision with staples in place. No crepitus or sternal instability.      LABS:  Recent Labs     08/01/18  0354 08/02/18  0346 08/03/18  0255   NA 139 139 140   K 3.6 3.9 3.7   CL 104 105 105   CO2 27 24 27    GAP 8 10 8    BUN 20 20 21    CR 1.09 1.00 0.95   GLU 85 90 94   CA 8.0* 8.1* 8.4*   MG 1.5* 1.7 1.9       Recent Labs     08/01/18  0354 08/02/18  0346 08/03/18  0255   WBC 8.3 9.9 8.6   HGB 11.2* 11.3* 11.5*   HCT 32.4* 33.3* 33.2*   PLTCT 173 193 195      Estimated Creatinine Clearance: 83.7 mL/min (based on SCr of 0.95 mg/dL).  Vitals:    07/31/18 0500 08/01/18 0656 08/03/18 0626   Weight: 97.8 kg (215 lb 9.6 oz) 95.7 kg (211 lb) 90.9 kg (200 lb 6.4 oz)      No results for input(s): PHART, PO2ART in the last 72 hours.    Invalid input(s): PC02A        Radiology and Other Diagnostic Procedures Review:    Reviewed

## 2018-08-03 NOTE — Progress Notes
08/03/18 0815   Cardiac Rehab Activity   Distance Walked (feet) 250 ft   BP Pre-activity 146/73   BP Post-activity 162/86   HR Pre-activity 63 bpm   HR Post-activity 87   SaO2 Pre-activity 97 %   SaO2 Post-activity 99   O2 Device None (Room Air)   Comments Pt tolerated about 228ft of ambulation on RN unit with use of a roller walker and on RA. Assisted to chair at the end of walk. No other needs at this time.   Mobility   Progressive Mobility Level 8   Level of Assistance Stand by assistance   Assistive Device Walker   Time Tolerated 11-30 minutes   Activity Limited By Fatigue

## 2018-08-03 NOTE — Case Management (ED)
Case Management Progress Note    NAME:Joel Hernandez                          MRN: [id number]              DOB:1940/02/27          AGE: 78 y.o.  ADMISSION DATE: 07/21/2018             DAYS ADMITTED: LOS: 13 days      Today???s Date: 08/03/2018    Plan  Dc planning for possible home with infusion services vs possible LTACH if [facility] and home infusion are not feasible.    Per CTS team huddle this am, pt diuresing well.  Pt with marked improvement in exercise tolerance.  ID final recommendations are for continuous infusion of Unasyn (12grams) and IV Vanc 1750mg  once a day.      Interventions  ? Support   Support: Pt/Family Updates re:POC or DC Plan, Patient Education     SW contacted pt's dgt this am to discuss dc planning.  SW notified dgt that pt was denied at [facility] d/t abx regimen.  Dgt interrupted SW and told her that their preference would be for pt to dc to home if able since PT/OT are now recommending same.  SW discussed that pt will dc with multiple IV abx and the NCM will need to verify cost associated with this and also confirm that pt has someone that can assist him with this regimen.  Dgt informed SW that her brother is an EMS first responder and is comfortable staying with pt and assisting with infusion needs.  SW told dgt she would have the NCM f/u with her to discuss HH and IV home infusion services.  SW did notify her that if pt cannot dc to home setting d/t cost of meds, then the only other option would be LTACH.  SW provided education about this level of care and she was in agreement with same but told SW her preference would be HH d/t risk of covid.     ? Info or Referral   Information or Referral to Community Resources: No Needs Identified  ? Discharge Planning   Discharge Planning: Home Infusion-[facility], Home Health     SW placed call to [facility] this morning to request verification re: whether or not they can accept pt for admit.  SW was able to speak with DON and they notified SW they are reviewing but will not likely be able to accept given his current IV abx regimen.  They notified SW they would f/u as soon as they were able.      Update 1215  SW received f/u call from SNF and they notified SW they cannot accept d/t pt's IV abx regimen.      SW spoke with ID team and confirmed final recs for IV Unasyn continuous infusion and IV Vanc once a day dosing regimen.      SW notified NCM of discussion with pt's dgt about going home with IV abx services and HH.  NCM to f/u re: further dc planning.      SW will peripherally follow in case pt needs LTACH at discharge.      ? Medication Needs   Medication Needs: No Needs Identified  o Financial   Financial: No Needs Identified  ? Legal   Legal: No Needs Identified  ? Other   Other/None: No needs  identified    Disposition  ? Expected Discharge Date    Expected Discharge Date: 08/04/18  Expected Discharge Time: 1400  ? Transportation   Does the patient need discharge transport arranged?: No  Transportation Name, Phone and Availability #1: Son - Junior Fetterhoff - [phone]  Does the patient use Medicaid Transportation?: No  ? Next Level of Care (Acute Psych discharges only)      ? Discharge Disposition      Durable Medical Equipment      No service has been selected for the patient.      [facility]      No service has been selected for the patient.      [facility]      No service has been selected for the patient.      [facility]      No service has been selected for the patient.        Gweneth Dimitri, LMSW  Surgery - Cardiothoracic/Vascular  Social Work Case Manager  [phone]

## 2018-08-04 LAB — BASIC METABOLIC PANEL
Lab: 106 MMOL/L — ABNORMAL HIGH (ref 98–110)
Lab: 3.9 MMOL/L (ref >60)

## 2018-08-04 LAB — CBC: Lab: 92 FL — ABNORMAL LOW (ref 80–100)

## 2018-08-04 LAB — POC GLUCOSE: Lab: 92 mg/dL (ref 70–100)

## 2018-08-04 LAB — VANCOMYCIN TROUGH: Lab: 14 ug/mL (ref 10.0–20.0)

## 2018-08-04 MED ORDER — VANCOMYCIN 1,500 MG IVPB
1500 mg | INTRAVENOUS | 0 refills | Status: DC
Start: 2018-08-04 — End: 2018-08-05
  Administered 2018-08-05 (×2): 1500 mg via INTRAVENOUS

## 2018-08-04 NOTE — Progress Notes
Assumed care at 0730   BP low this am, team notified and morning lisinopril and BB held.   V-paced on tele   Incisions c/d/i  Pt walking laps with nursing  Pt voiding without issue post catheter removal  BM today   Call light within reach, will cont to monitor

## 2018-08-04 NOTE — Progress Notes
Assumed care at 1900, no acute events this shift.  VSS, V paced on tele. Tolerating RA.   UO adequate this shift via foley catheter, last BM 7/7.  Pain controlled with PRN tylenol and oxy.  MS incision dressing CDI, left chest incision with steri strips.  Fall bundle in place.  Pt slept well, no further needs, will continue to monitor.

## 2018-08-04 NOTE — Discharge Planning (AHS/AVS)
Your home health agency is [facility]  .They will contact you within 24-48 hours to set up you admission visit. If you do not hear from them in that time, please call them at [phone]    Please do not hesitate to reach out with any questions or concerns re: the above.     Take good care of yourself!    Vanita Ingles, BSN RN  Integrated Nurse Case Manager  Inpatient Cardiothoracic Surgery  M-F 8:00-5:00  O: [phone]  P: [phone]

## 2018-08-04 NOTE — Progress Notes
08/04/18 1015   Cardiac Rehab Activity   Distance Walked (feet) 360 ft   BP Pre-activity 170/76   BP Post-activity 155/82   HR Pre-activity 88 bpm   HR Post-activity 105   SaO2 Pre-activity   (unable to obtain)   O2 Device None (Room Air)   Comments Pt tolerated a full lap on RN unit with use of a roller walker and on RA. No complaints of pain. Assisted to chair at the end of walk. Will see pt later this afternoon.    Mobility   Progressive Mobility Level 8   Level of Assistance Stand by assistance   Assistive Device Walker   Time Tolerated 11-30 minutes   Activity Limited By No limitations

## 2018-08-04 NOTE — Progress Notes
Cardiothoracic Surgery Progress Note     Joel Hernandez  Today's Date:  08/04/2018  Admission Date: 07/21/2018  LOS: 14 days      Procedure: 07/24/18 - EXPLORATION POSTOPERATIVE WOUND - CHEST: 35820 (CPT???) -Hematoma evacuation-07/24/2018      Procedure: 07/23/18 - INSERTION/ REPLACEMENT PERMANENT PACEMAKER WITH ATRIAL AND VENTRICULAR LEAD: 33208 (CPT???)  FLUOROSCOPY - CARDIAC: 16109 (CPT???) 6/26    Procedure: 07/21/18 - Aortic Root replacement (Freestyle)/Implantation of coronary artery buttons, Circ arrest: 33405 (CPT???) 6/24    POD: 13/10    Principal Problem:    S/P AVR (aortic valve replacement) and aortoplasty  Active Problems:    Hypertension    Obstructive sleep apnea    Coronary artery disease due to calcified coronary lesion    Stenosis of lateral recess of lumbar spine    HLD (hyperlipidemia)    Acute on chronic diastolic CHF (congestive heart failure), NYHA class 2 (HCC)    Hypokalemia    Hypomagnesemia    S/p bare metal coronary artery stent    Presence of permanent cardiac pacemaker    Unplanned return to operating room with repeat sternotomy for cardiac tamponade    Subjective: no complaint this morning    No acute events overnight. Anxious to go home    Assessment/Plan:      Neuro ??? A&Ox4. Pain well controlled. Continue prn tylenol, prn lidoderm patches & low dose PRN oxy.     CV ??? PPM in place, V paced 74. SBP 140s. Continue bASA, plavix,  atorvastatin, metoprolol 50 bid, cozaar 75 mg, lasix 40 iv bid.   TTE 6/29: LVEF 75% & hyperdynamic, mild reduce in RV function, grade 3 diastolic dysfunction, mild TR, mild to moderate MR.     Resp ??? on RA.  Continue IS, aggressive pulm toilet.     Renal ??? Serum creatinine 0.86-->1.0-->0.95-->1.03  BUN 20. UOP 2.1L/24hr, neg 1.5 L/24 hr. Now at POW. Cont lasix 40 mg po bid. Urology consulted for retention.  Will DC foley this am and check residual. If <200, ok to DC w/o foley. Continue pta flomax.  Hypomag/K- replace    GI - Continue regular diet. LBM 7/7. ID ??? Afebrile. WBC 21-->14-->10.3-->9.9-->8.6-->10.7. 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 ID following - appreciate assistance. Blood cultures 6/29- negative. Sputum culture negative.  All other ID testing negative. Continue doxycycline, vancomycin and unasyn., PICC placed 7/6     Heme-Hgb 12.3 platelets 1245. Continue to monitor acute blood loss anemia. Continue Plavix for history of DES. Foot pumps/HSQ for DVT prophylaxis.      FEN ???  Hgb A1c 5.2%.  DC MDCF and FSBS.     Activity ??? walked 360 feet yesterday.    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 SNF unable to accept due to IV antibiotics.  Poss DC home with HH v LTACH    Anderson Malta, [place]   6045  08/04/2018    Subjective:       HPI:   Joel Hernandez is a 78 y.o. male with pmhx of AS, CAD s/p PCI 04/2018, HTN, HLD, sleep apnea (doesn't need cpap after weight loss), former smoker, hyperthyroid, BPH. He was recently admitted for planned TAVR but it was aborted r/t a pseudoaneurysm around the aortic annulus (thought to  be from previous PCI in April). His eliquis was dc'ed and he was started on plavix. Now s/p AVR & root replacement today 6/24 with Dr. Helen Hashimoto. He was brought to the ICU following the procedure.???  ???  POD 1:  CHB  POD 2:  Dual chamber PPM, CTs discontinued  POD 3:  Tele status, start Metop. Patient transferred to CTP. Patient coded presumable vasovagal while in the restroom. Patient assisted to floor and received 1 round of chest compressions before achieving ROSC. Patient transferred to CTI where he was initially stable. Patient quickly declined hemodynamically requiring emergent intubation, central line and arterial line placedment. Pacer evaluation with proper pacer function. TTE with moderate amount of fluid around heart with decreased function. Patient emergently taken to CVOR for mediastinal exploration. Patient currently recovering postoperatively in CTI.   6/30: dc swan, slow wean dobutamine, lasix 20 IV this am. Dc swan. Continue empiric coverage for endocarditis. Transfer to floor.       REVIEW OF SYSTEMS:   Review of Systems   Constitutional: Negative for chills, fever and malaise/fatigue.   Respiratory: Negative for cough, hemoptysis and sputum production.    Cardiovascular: Positive for leg swelling. Negative for chest pain, orthopnea and claudication.   Gastrointestinal: Negative for blood in stool, heartburn, nausea and vomiting.   Genitourinary: Negative for frequency and hematuria.   Skin: Negative for itching and rash.   Neurological: Negative for dizziness, speech change, weakness and headaches.   Psychiatric/Behavioral: Negative for hallucinations. The patient has insomnia. The patient is not nervous/anxious.        Objective:        Medications:  Scheduled Meds:ampicillin/sulbactam (UNASYN) 3 g in sodium chloride 0.9% (NS) 100 mL IVPB (MB+), 3 g, Intravenous, Q6H*  aspirin chewable tablet 81 mg, 81 mg, Oral, QDAY  atorvastatin (LIPITOR) tablet 40 mg, 40 mg, Oral, QHS  clopiDOGrel (PLAVIX) tablet 75 mg, 75 mg, Oral, QDAY  docusate (COLACE) capsule 100 mg, 100 mg, Oral, BID  doxycycline (VIBRAMYCIN) tablet 100 mg, 100 mg, Oral, BID  furosemide (LASIX) tablet 40 mg, 40 mg, Oral, BID(9-17)  heparin (porcine) PF syringe 5,000 Units, 5,000 Units, Subcutaneous, Q8H  lidocaine (LIDODERM) 5 % topical patch 1-2 patch, 1-2 patch, Topical, QDAY    And  Verification of Patch Placement and Integrity - Lidocaine 5%, , Transdermal, BID  losartan (COZAAR) tablet 75 mg, 75 mg, Oral, QDAY  methIMAzole (TAPAZOLE) tablet 10 mg, 10 mg, Oral, QDAY    And  methIMAzole (TAPAZOLE) tablet 15 mg, 15 mg, Oral, QDAY  metoprolol tartrate (LOPRESSOR) tablet 50 mg, 50 mg, Oral, BID pneumococcal 23-val vaccine (PPSV23) (PNEUMOVAX 23) injection 0.5 mL, 0.5 mL, Intramuscular, ONCE  polyethylene glycol 3350 (MIRALAX) packet 17 g, 1 packet, Oral, BID  potassium chloride SR (K-DUR) tablet 10 mEq, 10 mEq, Oral, BID w/meals  senna/docusate (SENOKOT-S) tablet 2 tablet, 2 tablet, Oral, BID  tamsulosin (FLOMAX) capsule 0.4 mg, 0.4 mg, Oral, QHS  vancomycin (VANCOCIN) 1,750 mg in sodium chloride 0.9% (NS) IVPB, 1,750 mg, Intravenous, Q24H*    Continuous Infusions:    PRN and Respiratory Meds:acetaminophen Q6H PRN **OR** acetaminophen Q6H PRN, alum/mag hydroxide/simeth Q4H PRN, bisacodyL QDAY PRN, hydrALAZINE Q6H PRN, magnesium chloride PRN **OR** magnesium sulfate PRN (On Call from Rx), milk of magnesia (CONC) QDAY PRN, ondansetron Q6H PRN **OR** ondansetron (ZOFRAN) IV Q6H PRN, oxyCODONE Q4H PRN, potassium chloride SR PRN **OR** potassium chloride PRN **OR** potassium chloride in water PRN, vancomycin, pharmacy to manage Per Pharmacy  Vital Signs: Last Filed                  Vital Signs: 24 Hour Range   BP: 144/66 (07/08 0415)  Temp: 36.6 ???C (97.9 ???F) (07/08 0415)  Pulse: 62 (07/08 0415)  Respirations: 16 PER MINUTE (07/08 0415)  SpO2: 99 % (07/08 0415) BP: (97-146)/(51-66)   Temp:  [36.5 ???C (97.7 ???F)-36.8 ???C (98.2 ???F)]   Pulse:  [59-62]   Respirations:  [16 PER MINUTE-18 PER MINUTE]   SpO2:  [97 %-100 %]    Intensity Pain Scale (Self Report): 3 (08/04/18 0417) Vitals:    08/01/18 0656 08/03/18 0626 08/04/18 0415   Weight: 95.7 kg (211 lb) 90.9 kg (200 lb 6.4 oz) 90.7 kg (200 lb)           Intake/Output Summary:  (Last 24 hours)    Intake/Output Summary (Last 24 hours) at 08/04/2018 0659  Last data filed at 08/04/2018 0400  Gross per 24 hour   Intake 540 ml   Output 2125 ml   Net -1585 ml         Physical Exam:         Neuro: Alert and orientedx4, MAE   Cardiovascular: RRR no rub or murmur  Respiratory: LS CTA bil - diminished in the bases  GI: soft, NT, active BS Extremities: 1+ peripheral edema  Incisions: Sternal incision c/d/i No crepitus or sternal instability.      LABS:  Recent Labs     08/02/18  0346 08/03/18  0255 08/04/18  0410   NA 139 140 140   K 3.9 3.7 3.9   CL 105 105 106   CO2 24 27 26    GAP 10 8 8    BUN 20 21 20    CR 1.00 0.95 1.03   GLU 90 94 92   CA 8.1* 8.4* 8.5   MG 1.7 1.9  --        Recent Labs     08/02/18  0346 08/03/18  0255 08/04/18  0410   WBC 9.9 8.6 10.7   HGB 11.3* 11.5* 12.3*   HCT 33.3* 33.2* 35.9*   PLTCT 193 195 245      Estimated Creatinine Clearance: 77.1 mL/min (based on SCr of 1.03 mg/dL).  Vitals:    08/01/18 0656 08/03/18 0626 08/04/18 0415   Weight: 95.7 kg (211 lb) 90.9 kg (200 lb 6.4 oz) 90.7 kg (200 lb)      No results for input(s): PHART, PO2ART in the last 72 hours.    Invalid input(s): PC02A        Radiology and Other Diagnostic Procedures Review:    Reviewed

## 2018-08-04 NOTE — Progress Notes
Pharmacy Vancomycin Note  Subjective:   Joel Hernandez is a 78 y.o. male being treated for Endocarditis.    Objective:     Current Vancomycin Orders   Medication Dose Route Frequency    [START ON 08/05/2018] vancomycin (VANCOCIN) 1,500 mg in sodium chloride 0.9% (NS) IVPB  1,500 mg Intravenous Q24H*    vancomycin, pharmacy to manage  1 each Service Per Pharmacy     Start Date of  vancomycin therapy: 07/21/2018    White Blood Cells   Date/Time Value Ref Range Status   08/04/2018 0410 10.7 4.5 - 11.0 K/UL Final   08/03/2018 0255 8.6 4.5 - 11.0 K/UL Final   08/02/2018 0346 9.9 4.5 - 11.0 K/UL Final     Creatinine   Date/Time Value Ref Range Status   08/04/2018 0410 1.03 0.4 - 1.24 MG/DL Final   08/03/2018 0255 0.95 0.4 - 1.24 MG/DL Final   08/02/2018 0346 1.00 0.4 - 1.24 MG/DL Final     Blood Urea Nitrogen   Date/Time Value Ref Range Status   08/04/2018 0410 20 7 - 25 MG/DL Final     Actual Weight:  90.7 kg (200 lb)  Dosing BW:  90.7 kg   Drug Levels:  Vancomycin 2HR POST Dose   Date/Time Value Ref Range Status   08/04/2018 0958 34.1 ug/mL Final     Vancomycin Trough   Date/Time Value Ref Range Status   08/04/2018 0510 14.4 10.0 - 20.0 MCG/ML Final       Calculations:  Calculated True Peak (mcg/mL): 38.7 mcg/mL  Calculated Trough (mcg/mL):  13.7 mcg/mL  Rate of elimination (h-1):  0.04  Half Life (hr):  15.44 hours  Volume of distribution (L/kg): 0.72 L/kg  AUC (mcg*h/mL): 593.9 mcg*h/mL    Assessment:   Target levels for this patient:  1.  AUC (mcg*h/mL):  400-600  2.   trough 10-15    Evaluation of AUC and/or level(s): Levels drawn before 3rd dose of new regimen. Trough 67min late, 2h post dose 48 min late--overall appropriate.     Plan:   1. Decrease to 1500mg  q24h. Plan for 6 weeks abx therapy from 07/21/2018  2. Next scheduled level(s): TBD, patient may be discharging soon   3. Pharmacy will continue to monitor and adjust therapy as needed.    Nettie Elm, [facility]  08/04/2018

## 2018-08-04 NOTE — Progress Notes
OCCUPATIONAL THERAPY  PROGRESS NOTE      Name: Joel Hernandez        MRN: [id number]          DOB: 04-22-1940          Age: 78 y.o.  Admission Date: 07/21/2018             LOS: 14 days      Mobility  Progressive Mobility Level: Walk in room  Distance Walked (feet): 40 ft  Level of Assistance: Stand by assistance  Assistive Device: Walker  Time Tolerated: 11-30 minutes  Activity Limited By: No limitations    Subjective  Pertinent Dx per Physician: PMH: coronary disease status post PCI on 04/2018, aortic stenosis, hypertension, hyperlipidemia, OSA, hypothyroidism and BPH.  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 s/p AVR and root replacement 6/24. Admitted to CTICU for post operative management. Return to OR 6/27 for hemotama evacuation  Precautions: Falls;Sternal Precautions  R LE Precautions: (h/o RLE foot drop- no longer wears AFO)  Pain / Complaints: Patient agrees to participate in therapy;Patient premedicated;Patient has no c/o pain    Objective  Psychosocial Status: Willing and Cooperative to Participate    Home Living  Type of Home: House  Home Layout: Two Level  Financial risk analyst / Tub: Tub/Shower Unit  Home Equipment: Cane;Walker    Prior Function  Level Of Independence: Independent with ADLs and functional transfers;Independent with homemaking w/ ambulation  Lives With: Alone  Receives Help From: None Needed  Leisure: (hunting and fishing)    ADL's  Where Assessed: Chair;In Bathroom;Standing at [employer]: Stand By Assist  Grooming Deficits: Supervision/Safety;Wash/Dry Hands  LE Dressing Assist: Stand By Assist  LE Dressing Deficits: Supervision/Safety;Thread RLE Into Pants;Thread LLE Into Pants;Pull Up Over Hips;Fasteners  Toileting Assist: Stand By Assist  Toileting Deficits: Clothing Management Up;Clothing Management Down;Supervision/Safety    ADL Mobility  Transfer Type: Sit to/from stand  Transfer: Assistance Level: From;Bedside chair;To/from;Toilet;To;Bed;Standby assist  Transfer: Assistive Device: Roller walker  End of Activity Status: (seated edge of bed with cardiac rehab)  Standing Balance: Standby assist  Gait Distance: 40 feet  Gait: Assistance Level: Standby assist  Gait: Assistive Device: Roller walker    Activity Tolerance  Endurance: 2/5 Tolerates 10-20 Minutes Exercise w/Multiple Rests    Cognition  Overall Cognitive Status: WFL to Adequately Complete Self Care Tasks Safely  Attention: Awake/Alert    Education  Goal Formulation: With Patient    Assessment  Assessment: Decreased ADL Status;Decreased Safe/Judg during ADL;Decreased Endurance;Decreased Self-Care Trans;Decreased High-Level ADLs  Prognosis: Good;w/ Family  Goal Formulation: Pt/family    AM-PAC 6 Clicks Daily Activity Inpatient  Putting on and taking off regular lower body clothes?: A Little  Bathing (Including washing, rinsing, drying): A Little  Toileting, which includes using toilet, bedpan, or urinal: A Little  Putting on and taking off regular upper body clothing: None  Taking care of personal grooming such as brushing teeth: None  Eating meals?: None  Daily Activity Raw Score: 21  Standardized (t-scale) score: 44.27  CMS 0-100% Score: 32.79  CMS G Code Modifier: CJ    Plan  OT Frequency: 5x/week    Further Evaluation Goals  Pt Will Tolerate Further ADL Evaluation: w/in1-2 sessions, Met    ADL Goals  Patient Will Perform Grooming: Standing at Sink;w/ Stand By Assist;Met  Patient Will Perform LE Dressing: w/ Stand By Assist;Partly Met    Functional Transfer Goals  Pt Will Perform  All Functional Transfers: w/ Stand By Assist, Met(new goal: modified independence)    OT Discharge Recommendations  Recommendation: Home with consistent supervision/assistance  Patient Currently Requires Physical Assist With: All home functioning ADLs;Dressing  Patient Currently Requires Supervision For: ADLs;Mobility  Patient Currently Requires Equipment: Owns what is needed Therapist: Mariana Single, OTR/L [id number]  Date: 08/04/2018

## 2018-08-04 NOTE — Progress Notes
Infectious Disease Progress Note    Name:  Joel Hernandez   Today's Date:  08/04/2018  Admission Date: 07/21/2018    Consulted for endocarditis on pathology  ???  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 Darrick Penna), fibrinous exudate along with neutrophilic infiltrates seen on tissue sample from 07/21/18 - special stains negative for microorganisms  -negative: Bartonella, Brucella, Coxiella, Chlamydia, serologies neg, Tropheryma whipplei PCR neg, Broad Range PCR neg (aortic leaflet)  -BC 6/29 x 3 sets NTD  ???  Severe aortic stenosis  Aortic annulus psuedoaneurysm with crescentic hematoma   S/p aortic root replacement, ascending aorta replacement, and repair of aortic annulus pseudoaneurysm 07/21/18  -06/10/18 CTA: The proximal ascending aorta measures 45 mm The descending thoracic aorta measures 25 mm. There is a 2 cm x 2 cm comminuted fluid collection consistent with a perivalvular pseudoaneurysm surrounded by a crescentic hematoma located along the non-coronary cusp of the aortic valve with questionable visualized neck measuring 1mm. No mass effect.  -06/29/18 CTA: Increase in size of a perivalvular pseudoaneurysm posterior to the aortic root, likely arising from the left coronary cusp.  -07/21/18 Underwent aortic root replacement, ascending aorta replacement, and repair of aortic annulus pseudoaneurysm with op note describing a tremendous amount of inflammation posterior to the aortic annulus and extending down onto the dome of the left atrium.  ???  Postoperative mediastinal hematoma status post chest tube placement 07/25/18  ???  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  CAD s/p DES 05/24/18  HTN  OSA    Recommendations: Cultures and microbiological work up remain negative. Will plan to cont empiric atbx.   I discussed at length pt and daughter again today. Planning to dc to home w assistance from son who has EMT training.     1. Continue antibiotic therapy with vancomycin (changed to 1750mg  q 24hr- pharm to dose), amp/sulbactam (3gm IV q 6hr), and doxycycline.  Amp/sulbactrim can be changed to continuous infusion (12gm per 24hr infusion) at dc; if Amp/sulba will be a cost/dosing issue can price ertapenem 1 g daily intead  2. Monitor for drug toxicity and side-effects with CBC and CMP  3. At dc weekly cbc, cmp, vanc trough fax to [phone]  4. Counseled to avoid sun exposure with doxycycline - risk for phototoxicity  5. Plan 6 wks IV atbx if tolerates (from 6/24)  6. Follow up Legionella serologies    Complexity of medical decision making is high b/c of the multi-system nature of the infectious disease process and concerns about the complexity of the patient illness including the sensitivity of the organisms being treated, the potential for drug toxicity and interactions, concerns about immunologic function, and interplay of other issues.      _____________________________________________________________________________  Interval History  Joel Hernandez is a 78 y.o. male with a PMH significant for aortic stenosis, pseudoaneurysm of aortic annulus, NSTEMI, HTN, CAD, and OSA who presented to [facility] on 07/21/18 for a planned aortic valve procedure.    Tolerating atbx. Reviewed change in outpt atbx dosing  Reviewed risk of PIC including infection and clot  No HA  No visual changes  No hearing or balance complaints  No rash  No cough or sob  Sternal wound healing  ROS otherwise negative on 10 pt review    Labs reviewed.    Antimicrobial Start date End date   Cefazolin (peri-op) 07/21/18-07/22/18; 07/24/18 07/25/18   Vancomycin IV  07/21/18; 07/26/18 Active   Amp/sulbactam 07/26/18 Active   Doxycycline 07/26/18 Active   ??? ??? ???   ??? ??? ???   ??? ??? ??? Estimated Creatinine Clearance: 77.1 mL/min (based on SCr of 1.03 mg/dL).    Microbiology Review   07/26/18 Blood cx (3 sets): NGTD  07/26/18 Sputum GS/cx: >25 neutrophils/lpf, 10-25 squams/lpf, mod mixed bacteria - nml flora    Medications  Scheduled Meds:ampicillin/sulbactam (UNASYN) 3 g in sodium chloride 0.9% (NS) 100 mL IVPB (MB+), 3 g, Intravenous, Q6H*  aspirin chewable tablet 81 mg, 81 mg, Oral, QDAY  atorvastatin (LIPITOR) tablet 40 mg, 40 mg, Oral, QHS  clopiDOGrel (PLAVIX) tablet 75 mg, 75 mg, Oral, QDAY  docusate (COLACE) capsule 100 mg, 100 mg, Oral, BID  doxycycline (VIBRAMYCIN) tablet 100 mg, 100 mg, Oral, BID  furosemide (LASIX) tablet 40 mg, 40 mg, Oral, BID(9-17)  heparin (porcine) PF syringe 5,000 Units, 5,000 Units, Subcutaneous, Q8H  lidocaine (LIDODERM) 5 % topical patch 1-2 patch, 1-2 patch, Topical, QDAY    And  Verification of Patch Placement and Integrity - Lidocaine 5%, , Transdermal, BID  losartan (COZAAR) tablet 75 mg, 75 mg, Oral, QDAY  methIMAzole (TAPAZOLE) tablet 10 mg, 10 mg, Oral, QDAY    And  methIMAzole (TAPAZOLE) tablet 15 mg, 15 mg, Oral, QDAY  metoprolol tartrate (LOPRESSOR) tablet 50 mg, 50 mg, Oral, BID  pneumococcal 23-val vaccine (PPSV23) (PNEUMOVAX 23) injection 0.5 mL, 0.5 mL, Intramuscular, ONCE  polyethylene glycol 3350 (MIRALAX) packet 17 g, 1 packet, Oral, BID  potassium chloride SR (K-DUR) tablet 10 mEq, 10 mEq, Oral, BID w/meals  senna/docusate (SENOKOT-S) tablet 2 tablet, 2 tablet, Oral, BID  tamsulosin (FLOMAX) capsule 0.4 mg, 0.4 mg, Oral, QHS  [START ON 08/05/2018] vancomycin (VANCOCIN) 1,500 mg in sodium chloride 0.9% (NS) IVPB, 1,500 mg, Intravenous, Q24H*    Continuous Infusions:    PRN and Respiratory Meds:acetaminophen Q6H PRN **OR** acetaminophen Q6H PRN, alum/mag hydroxide/simeth Q4H PRN, bisacodyL QDAY PRN, hydrALAZINE Q6H PRN, magnesium chloride PRN **OR** magnesium sulfate PRN (On Call from Rx), milk of magnesia (CONC) QDAY PRN, ondansetron Q6H PRN **OR** ondansetron (ZOFRAN) IV Q6H PRN, oxyCODONE Q4H PRN, potassium chloride SR PRN **OR** potassium chloride PRN **OR** potassium chloride in water PRN, vancomycin, pharmacy to manage Per Pharmacy      Physical Examination                          Vital Signs: Last                  Vital Signs: 24 Hour Range   BP: 118/68 (07/08 1133)  Temp: 36.3 ???C (97.3 ???F) (07/08 1133)  Pulse: 62 (07/08 1133)  Respirations: 16 PER MINUTE (07/08 1133)  SpO2: 98 % (07/08 1133) BP: (93-146)/(44-68)   Temp:  [36.3 ???C (97.3 ???F)-36.8 ???C (98.2 ???F)]   Pulse:  [59-62]   Respirations:  [16 PER MINUTE-18 PER MINUTE]   SpO2:  [96 %-100 %]      General appearance: alert, oriented,   H&N: no oral lesions/thrush  Neck: supple  Lungs: decreased bs bases, no rales  Heart: Regular rhythm, reg rate, with no murmur  Chest: sternal wound well approximated, no drainage, no erythema, scab at top  Abdomen: soft, non-tender, normoactive bowel sounds  Ext:  1+extremity edema  Skin: no rash, ecchymotic lesions on forearms    Lines/drains/tubes:  PIV, CTs  Laboratory   Hematology  Recent Labs     08/02/18  0346 08/03/18  0255 08/04/18  0410   WBC 9.9 8.6 10.7   HGB 11.3* 11.5* 12.3*   HCT 33.3* 33.2* 35.9*   PLTCT 193 195 245     Chemistry  Recent Labs     08/02/18  0346 08/03/18  0255 08/04/18  0410   NA 139 140 140   K 3.9 3.7 3.9   CL 105 105 106   CO2 24 27 26    BUN 20 21 20    CR 1.00 0.95 1.03   GFR >60 >60 >60   GLU 90 94 92   CA 8.1* 8.4* 8.5       Radiology and other Diagnostics Review   Pertinent radiology reviewed.    CXR 7/3 unchanged L>R effusion /atelectasis     Arn Medal, MD  Beeper 2008

## 2018-08-05 ENCOUNTER — Encounter: Admit: 2018-07-21 | Discharge: 2018-07-21

## 2018-08-05 ENCOUNTER — Encounter: Admit: 2018-07-24 | Discharge: 2018-07-24

## 2018-08-05 ENCOUNTER — Encounter: Admit: 2018-07-25 | Discharge: 2018-07-25

## 2018-08-05 ENCOUNTER — Encounter: Admit: 2018-07-28 | Discharge: 2018-07-28

## 2018-08-05 ENCOUNTER — Encounter: Admit: 2018-08-02 | Discharge: 2018-08-02

## 2018-08-05 ENCOUNTER — Encounter: Admit: 2018-07-20 | Discharge: 2018-07-20

## 2018-08-05 ENCOUNTER — Encounter: Admit: 2018-07-21 | Discharge: 2018-08-05 | Disposition: A | Discharge status: Home Health

## 2018-08-05 ENCOUNTER — Encounter: Admit: 2018-08-05 | Discharge: 2018-08-05

## 2018-08-05 ENCOUNTER — Encounter: Admit: 2018-07-29 | Discharge: 2018-07-29

## 2018-08-05 ENCOUNTER — Encounter: Admit: 2018-07-26 | Discharge: 2018-07-26

## 2018-08-05 ENCOUNTER — Encounter: Admit: 2018-07-23 | Discharge: 2018-07-23

## 2018-08-05 ENCOUNTER — Encounter: Admit: 2018-07-30 | Discharge: 2018-07-30

## 2018-08-05 ENCOUNTER — Encounter: Admit: 2018-07-22 | Discharge: 2018-07-22

## 2018-08-05 DIAGNOSIS — R570 Cardiogenic shock: Secondary | ICD-10-CM

## 2018-08-05 DIAGNOSIS — I110 Hypertensive heart disease with heart failure: Secondary | ICD-10-CM

## 2018-08-05 DIAGNOSIS — Q2543 Congenital aneurysm of aorta: Secondary | ICD-10-CM

## 2018-08-05 DIAGNOSIS — N179 Acute kidney failure, unspecified: Secondary | ICD-10-CM

## 2018-08-05 DIAGNOSIS — J942 Hemothorax: Secondary | ICD-10-CM

## 2018-08-05 DIAGNOSIS — Z96651 Presence of right artificial knee joint: Secondary | ICD-10-CM

## 2018-08-05 DIAGNOSIS — I339 Acute and subacute endocarditis, unspecified: Secondary | ICD-10-CM

## 2018-08-05 DIAGNOSIS — I97638 Postprocedural hematoma of a circulatory system organ or structure following other circulatory system procedure: Secondary | ICD-10-CM

## 2018-08-05 DIAGNOSIS — Z87891 Personal history of nicotine dependence: Secondary | ICD-10-CM

## 2018-08-05 DIAGNOSIS — Z955 Presence of coronary angioplasty implant and graft: Secondary | ICD-10-CM

## 2018-08-05 DIAGNOSIS — R338 Other retention of urine: Secondary | ICD-10-CM

## 2018-08-05 DIAGNOSIS — I330 Acute and subacute infective endocarditis: Secondary | ICD-10-CM

## 2018-08-05 DIAGNOSIS — I2583 Coronary atherosclerosis due to lipid rich plaque: Secondary | ICD-10-CM

## 2018-08-05 DIAGNOSIS — R5381 Other malaise: Secondary | ICD-10-CM

## 2018-08-05 DIAGNOSIS — Z1159 Encounter for screening for other viral diseases: Secondary | ICD-10-CM

## 2018-08-05 DIAGNOSIS — E039 Hypothyroidism, unspecified: Secondary | ICD-10-CM

## 2018-08-05 DIAGNOSIS — D696 Thrombocytopenia, unspecified: Secondary | ICD-10-CM

## 2018-08-05 DIAGNOSIS — Z85828 Personal history of other malignant neoplasm of skin: Secondary | ICD-10-CM

## 2018-08-05 DIAGNOSIS — I452 Bifascicular block: Secondary | ICD-10-CM

## 2018-08-05 DIAGNOSIS — E7800 Pure hypercholesterolemia, unspecified: Secondary | ICD-10-CM

## 2018-08-05 DIAGNOSIS — R161 Splenomegaly, not elsewhere classified: Secondary | ICD-10-CM

## 2018-08-05 DIAGNOSIS — M48061 Spinal stenosis, lumbar region without neurogenic claudication: Secondary | ICD-10-CM

## 2018-08-05 DIAGNOSIS — I2584 Coronary atherosclerosis due to calcified coronary lesion: Secondary | ICD-10-CM

## 2018-08-05 DIAGNOSIS — I77810 Thoracic aortic ectasia: Secondary | ICD-10-CM

## 2018-08-05 DIAGNOSIS — N401 Enlarged prostate with lower urinary tract symptoms: Secondary | ICD-10-CM

## 2018-08-05 DIAGNOSIS — K7460 Unspecified cirrhosis of liver: Secondary | ICD-10-CM

## 2018-08-05 DIAGNOSIS — E872 Acidosis: Secondary | ICD-10-CM

## 2018-08-05 DIAGNOSIS — I314 Cardiac tamponade: Secondary | ICD-10-CM

## 2018-08-05 DIAGNOSIS — I340 Nonrheumatic mitral (valve) insufficiency: Secondary | ICD-10-CM

## 2018-08-05 DIAGNOSIS — E785 Hyperlipidemia, unspecified: Secondary | ICD-10-CM

## 2018-08-05 DIAGNOSIS — G4733 Obstructive sleep apnea (adult) (pediatric): Secondary | ICD-10-CM

## 2018-08-05 DIAGNOSIS — I728 Aneurysm of other specified arteries: Secondary | ICD-10-CM

## 2018-08-05 DIAGNOSIS — I442 Atrioventricular block, complete: Secondary | ICD-10-CM

## 2018-08-05 DIAGNOSIS — Z7902 Long term (current) use of antithrombotics/antiplatelets: Secondary | ICD-10-CM

## 2018-08-05 DIAGNOSIS — I480 Paroxysmal atrial fibrillation: Secondary | ICD-10-CM

## 2018-08-05 DIAGNOSIS — I2510 Atherosclerotic heart disease of native coronary artery without angina pectoris: Secondary | ICD-10-CM

## 2018-08-05 DIAGNOSIS — I252 Old myocardial infarction: Secondary | ICD-10-CM

## 2018-08-05 DIAGNOSIS — I5033 Acute on chronic diastolic (congestive) heart failure: Secondary | ICD-10-CM

## 2018-08-05 DIAGNOSIS — E876 Hypokalemia: Secondary | ICD-10-CM

## 2018-08-05 DIAGNOSIS — D684 Acquired coagulation factor deficiency: Secondary | ICD-10-CM

## 2018-08-05 DIAGNOSIS — I38 Endocarditis, valve unspecified: Secondary | ICD-10-CM

## 2018-08-05 DIAGNOSIS — I350 Nonrheumatic aortic (valve) stenosis: Principal | ICD-10-CM

## 2018-08-05 DIAGNOSIS — Z792 Long term (current) use of antibiotics: Secondary | ICD-10-CM

## 2018-08-05 LAB — CBC: Lab: 9.1 K/UL — ABNORMAL LOW (ref >60)

## 2018-08-05 LAB — BASIC METABOLIC PANEL: Lab: 140 MMOL/L — ABNORMAL LOW (ref 137–147)

## 2018-08-05 MED ORDER — METOPROLOL TARTRATE 50 MG PO TAB
50 mg | ORAL_TABLET | Freq: Two times a day (BID) | ORAL | 3 refills | 90.00000 days | Status: AC
Start: 2018-08-05 — End: ?
  Filled 2018-08-05: qty 360, 90d supply, fill #1

## 2018-08-05 MED ORDER — LOSARTAN 100 MG PO TAB
50 mg | ORAL_TABLET | Freq: Every day | ORAL | 3 refills | 90.00000 days | Status: DC
Start: 2018-08-05 — End: 2018-09-02

## 2018-08-05 MED ORDER — LOSARTAN 50 MG PO TAB
50 mg | Freq: Every day | ORAL | 0 refills | Status: DC
Start: 2018-08-05 — End: 2018-08-05
  Administered 2018-08-05: 13:00:00 50 mg via ORAL

## 2018-08-05 MED ORDER — ASPIRIN 81 MG PO CHEW
81 mg | ORAL_TABLET | Freq: Every day | ORAL | 0 refills | Status: AC
Start: 2018-08-05 — End: ?

## 2018-08-05 MED ORDER — SENNOSIDES-DOCUSATE SODIUM 8.6-50 MG PO TAB
2 | Freq: Two times a day (BID) | ORAL | 0 refills | Status: DC
Start: 2018-08-05 — End: 2018-09-02

## 2018-08-05 MED ORDER — OXYCODONE-ACETAMINOPHEN 5-325 MG PO TAB
1-2 | ORAL_TABLET | ORAL | 0 refills | 2.00000 days | Status: DC | PRN
Start: 2018-08-05 — End: 2018-09-02
  Filled 2018-08-05: qty 30, 4d supply, fill #1

## 2018-08-05 MED ORDER — AMPICILLIN/SULBACTAM 3G/100ML NS IVPB (MB+)
INTRAVENOUS | 0 refills | Status: DC
Start: 2018-08-05 — End: 2018-09-02

## 2018-08-05 MED ORDER — VANCOMYCIN 1,500 MG IVPB
1500 mg | INTRAVENOUS | 0 refills | 10.00000 days | Status: DC
Start: 2018-08-05 — End: 2018-09-02

## 2018-08-05 MED ORDER — DOXYCYCLINE HYCLATE 100 MG PO TAB
100 mg | ORAL_TABLET | Freq: Two times a day (BID) | ORAL | 1 refills | 8.00000 days | Status: DC
Start: 2018-08-05 — End: 2018-09-02
  Filled 2018-08-05: qty 90, 30d supply, fill #1

## 2018-08-05 MED ORDER — POTASSIUM CHLORIDE 10 MEQ PO TBTQ
ORAL_TABLET | Freq: Two times a day (BID) | 1 refills | 30.00000 days | Status: DC
Start: 2018-08-05 — End: 2018-09-02
  Filled 2018-08-05: qty 90, 83d supply, fill #1

## 2018-08-05 MED ORDER — FUROSEMIDE 40 MG PO TAB
ORAL_TABLET | Freq: Every day | ORAL | 1 refills | 90.00000 days | Status: DC
Start: 2018-08-05 — End: 2018-09-02
  Filled 2018-08-05: qty 90, 83d supply, fill #1

## 2018-08-05 NOTE — Progress Notes
1415 - Reviewed discharge instructions, prescriptions/medications, follow-up appt with pt and pt's son.  Pt and family states verbal understanding - no questions.  Pt states verbal understanding of care and maintenance of PICC line - home abx infusion running at this time - no questions.  Tele dc'd by pt's RN.  Pt will pick up medications at [facility] on his way to the front door.

## 2018-08-05 NOTE — Progress Notes
Reconciling OPAT note.  Discharged on 08/05/18  Diagnosis:endocarditis   ID Staff:Dr. Ventura Sellers ID Nurse:Ishani Akpovona  Next ID Appt:tbd    Labs: cbc, cmp, vancomycin trough Q MON Start:08/09/18    Antibiotics:  Vancomycin 1.5 g IV Q 24 HRS,  Unasyn 12g IV Q 24 hrs continuous infusion   Doxycycline 100 Mg PO BID Dispensed at [place].    Line: [place]: [facility] [phone], [phone] Anderson Malta confirmed orders.  Infusion Pharmacy: [facility] [phone], Fax [phone] Orders confirmed via O2 with Maylon Cos RPH's note & Hetty Ely.

## 2018-08-05 NOTE — Progress Notes
Cardiothoracic Surgery Progress Note     Joel Hernandez  Today's Date:  08/05/2018  Admission Date: 07/21/2018  LOS: 15 days      Procedure: 07/24/18 - EXPLORATION POSTOPERATIVE WOUND - CHEST: 35820 (CPT???) -Hematoma evacuation-07/24/2018      Procedure: 07/23/18 - INSERTION/ REPLACEMENT PERMANENT PACEMAKER WITH ATRIAL AND VENTRICULAR LEAD: 33208 (CPT???)  FLUOROSCOPY - CARDIAC: 16109 (CPT???) 6/26    Procedure: 07/21/18 - Aortic Root replacement (Freestyle)/Implantation of coronary artery buttons, Circ arrest: 33405 (CPT???) 6/24    POD: 14/11    Principal Problem:    S/P AVR (aortic valve replacement) and aortoplasty  Active Problems:    Hypertension    Obstructive sleep apnea    Coronary artery disease due to calcified coronary lesion    Stenosis of lateral recess of lumbar spine    HLD (hyperlipidemia)    Acute on chronic diastolic CHF (congestive heart failure), NYHA class 2 (HCC)    Hypokalemia    Hypomagnesemia    S/p bare metal coronary artery stent    Presence of permanent cardiac pacemaker    Unplanned return to operating room with repeat sternotomy for cardiac tamponade    Subjective: no complaint this morning    No acute events overnight. Anxious to go home    Assessment/Plan:      Neuro ??? A&Ox4. Pain well controlled. Continue prn tylenol, prn lidoderm patches & low dose PRN oxy.     CV ??? PPM in place, V paced 74. SBP 110-130s. Continue bASA, plavix,  atorvastatin, metoprolol 50 bid,  lasix 40 iv bid. Decrease cozaar to 50 mg.    TTE 6/29: LVEF 75% & hyperdynamic, mild reduce in RV function, grade 3 diastolic dysfunction, mild TR, mild to moderate MR.     Resp ??? on RA.  Continue IS, aggressive pulm toilet.     Renal ??? Serum creatinine 0.86-->1.0-->0.95-->1.03-->1.06  BUN 22. UOP 1.8L/24hr.  Now at POW.  Foley removed yesterday, patient voiding on his own.  PVR was 47 ml.  Continue pta flomax.  Hypomag/K- replace    GI - Continue regular diet. LBM 7/7. ID ??? Afebrile. WBC 21-->14-->10.3-->9.9-->8.6-->10.7-->9.1. 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 ID following - appreciate assistance. Blood cultures 6/29- negative. Sputum culture negative.  All other ID testing negative. Continue doxycycline, vancomycin and unasyn., PICC placed 7/6. Unasyn to be changed to infusion on DC.     Heme-Hgb 12.3-->11.5,  platelets 216. Continue to monitor acute blood loss anemia. Continue Plavix for history of DES. Foot pumps/HSQ for DVT prophylaxis.      FEN ???  Hgb A1c 5.2%.  DC MDCF and FSBS.     Activity ??? walked 360 feet yesterday.    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  Discuss diuretics with staff.     Anderson Malta, [place]   6045  08/05/2018    Subjective:       HPI:   Joel Hernandez is a 78 y.o. male with pmhx of AS, CAD s/p PCI 04/2018, HTN, HLD, sleep apnea (doesn't need cpap after weight loss), former smoker, hyperthyroid, BPH. 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 His eliquis was dc'ed and he was  started on plavix. Now s/p AVR & root replacement today 6/24 with Dr. Helen Hashimoto. He was brought to the ICU following the procedure.???  ???  POD 1:  CHB  POD 2:  Dual chamber PPM, CTs discontinued  POD 3:  Tele status, start Metop. Patient transferred to CTP. Patient coded presumable vasovagal while in the restroom. Patient assisted to floor and received 1 round of chest compressions before achieving ROSC. Patient transferred to CTI where he was initially stable. Patient quickly declined hemodynamically requiring emergent intubation, central line and arterial line placedment. Pacer evaluation with proper pacer function. TTE with moderate amount of fluid around heart with decreased function. Patient emergently taken to CVOR for mediastinal exploration. Patient currently recovering postoperatively in CTI.   6/30: dc swan, slow wean dobutamine, lasix 20 IV this am. Dc swan. Continue empiric coverage for endocarditis. Transfer to floor.       REVIEW OF SYSTEMS:   Review of Systems   Constitutional: Negative for chills, fever and malaise/fatigue.   Respiratory: Negative for cough, hemoptysis and sputum production.    Cardiovascular: Positive for leg swelling. Negative for chest pain, orthopnea and claudication.   Gastrointestinal: Negative for blood in stool, heartburn, nausea and vomiting.   Genitourinary: Negative for frequency and hematuria.   Skin: Negative for itching and rash.   Neurological: Negative for dizziness, speech change, weakness and headaches.   Psychiatric/Behavioral: Negative for hallucinations. The patient has insomnia. The patient is not nervous/anxious.        Objective:        Medications:  Scheduled Meds:ampicillin/sulbactam (UNASYN) 3 g in sodium chloride 0.9% (NS) 100 mL IVPB (MB+), 3 g, Intravenous, Q6H*  aspirin chewable tablet 81 mg, 81 mg, Oral, QDAY  atorvastatin (LIPITOR) tablet 40 mg, 40 mg, Oral, QHS  clopiDOGrel (PLAVIX) tablet 75 mg, 75 mg, Oral, QDAY  docusate (COLACE) capsule 100 mg, 100 mg, Oral, BID  doxycycline (VIBRAMYCIN) tablet 100 mg, 100 mg, Oral, BID  furosemide (LASIX) tablet 40 mg, 40 mg, Oral, BID(9-17)  heparin (porcine) PF syringe 5,000 Units, 5,000 Units, Subcutaneous, Q8H  lidocaine (LIDODERM) 5 % topical patch 1-2 patch, 1-2 patch, Topical, QDAY    And  Verification of Patch Placement and Integrity - Lidocaine 5%, , Transdermal, BID  losartan (COZAAR) tablet 75 mg, 75 mg, Oral, QDAY  methIMAzole (TAPAZOLE) tablet 10 mg, 10 mg, Oral, QDAY    And  methIMAzole (TAPAZOLE) tablet 15 mg, 15 mg, Oral, QDAY  metoprolol tartrate (LOPRESSOR) tablet 50 mg, 50 mg, Oral, BID pneumococcal 23-val vaccine (PPSV23) (PNEUMOVAX 23) injection 0.5 mL, 0.5 mL, Intramuscular, ONCE  polyethylene glycol 3350 (MIRALAX) packet 17 g, 1 packet, Oral, BID  potassium chloride SR (K-DUR) tablet 10 mEq, 10 mEq, Oral, BID w/meals  senna/docusate (SENOKOT-S) tablet 2 tablet, 2 tablet, Oral, BID  tamsulosin (FLOMAX) capsule 0.4 mg, 0.4 mg, Oral, QHS  vancomycin (VANCOCIN) 1,500 mg in sodium chloride 0.9% (NS) IVPB, 1,500 mg, Intravenous, Q24H*    Continuous Infusions:    PRN and Respiratory Meds:acetaminophen Q6H PRN **OR** acetaminophen Q6H PRN, alum/mag hydroxide/simeth Q4H PRN, bisacodyL QDAY PRN, hydrALAZINE Q6H PRN, magnesium chloride PRN **OR** magnesium sulfate PRN (On Call from Rx), milk of magnesia (CONC) QDAY PRN, ondansetron Q6H PRN **OR** ondansetron (ZOFRAN) IV Q6H PRN, oxyCODONE Q4H PRN, potassium chloride SR PRN **OR** potassium chloride PRN **OR** potassium chloride in water PRN, vancomycin, pharmacy to manage Per Pharmacy  Vital Signs: Last Filed                  Vital Signs: 24 Hour Range   BP: 133/63 (07/09 0321)  Temp: 36.6 ???C (97.9 ???F) (07/09 0321)  Pulse: 61 (07/09 0321)  Respirations: 16 PER MINUTE (07/09 0321)  SpO2: 96 % (07/09 0321) BP: (93-138)/(44-71)   Temp:  [36.3 ???C (97.3 ???F)-36.8 ???C (98.2 ???F)]   Pulse:  [59-62]   Respirations:  [16 PER MINUTE]   SpO2:  [96 %-100 %]    Intensity Pain Scale (Self Report): 3 (08/04/18 1530) Vitals:    08/03/18 0626 08/04/18 0415 08/05/18 1610   Weight: 90.9 kg (200 lb 6.4 oz) 90.7 kg (200 lb) 90.4 kg (199 lb 3.2 oz)           Intake/Output Summary:  (Last 24 hours)    Intake/Output Summary (Last 24 hours) at 08/05/2018 0701  Last data filed at 08/05/2018 9604  Gross per 24 hour   Intake 1180 ml   Output 2000 ml   Net -820 ml         Physical Exam:         Neuro: Alert and orientedx4, MAE   Cardiovascular: RRR no rub or murmur  Respiratory: LS CTA bil - diminished in the bases  GI: soft, NT, active BS Extremities: 1+ peripheral edema  Incisions: Sternal incision c/d/i No crepitus or sternal instability.      LABS:  Recent Labs     08/03/18  0255 08/04/18  0410 08/05/18  0340   NA 140 140 140   K 3.7 3.9 3.9   CL 105 106 107   CO2 27 26 24    GAP 8 8 9    BUN 21 20 22    CR 0.95 1.03 1.06   GLU 94 92 86   CA 8.4* 8.5 8.5   MG 1.9  --   --        Recent Labs     08/03/18  0255 08/04/18  0410 08/05/18  0340   WBC 8.6 10.7 9.1   HGB 11.5* 12.3* 11.5*   HCT 33.2* 35.9* 33.4*   PLTCT 195 245 216      Estimated Creatinine Clearance: 74.6 mL/min (based on SCr of 1.06 mg/dL).  Vitals:    08/03/18 0626 08/04/18 0415 08/05/18 0619   Weight: 90.9 kg (200 lb 6.4 oz) 90.7 kg (200 lb) 90.4 kg (199 lb 3.2 oz)      No results for input(s): PHART, PO2ART in the last 72 hours.    Invalid input(s): PC02A        Radiology and Other Diagnostic Procedures Review:    Reviewed

## 2018-08-05 NOTE — Case Management (ED)
Case Management Progress Note    NAME:Joel Hernandez                          MRN: [id number]              DOB:01-12-1941          AGE: 78 y.o.  ADMISSION DATE: 07/21/2018             DAYS ADMITTED: LOS: 15 days      Today???s Date: 08/05/2018    Plan  Pt to DC today    Interventions  ? Support   Support: Pt/Family Updates re:POC or DC Plan, Patient Education  ? Info or Referral   Information or Referral to Community Resources: No Needs Identified  ? Discharge Planning   Discharge Planning: Durable Medical Equipment and Supplies, Home Infusion-Enteral-TPN, Home Health    -EMR and POC review.    -Pt to DC home today per PA-C.   Emogene Morgan completed IV abx teach at bedside today with pts son.   -IV abx meds and supplies delivered to bedside during teach.   -NCM updated [facility] of DC.   -AVS sent to [facility].   -CM to continue to follow for DC needs.  ? Medication Needs   Medication Needs: Co-Pay Check  ?  Financial   Financial: No Needs Identified  ? Legal   Legal: No Needs Identified  ? Other   Other/None: No needs identified    Disposition  ? Expected Discharge Date    Expected Discharge Date: 08/05/18  Expected Discharge Time: 1400  ? Transportation   Does the patient need discharge transport arranged?: No  Transportation Name, Phone and Availability #1: Son - Eliza Grissinger - [phone]  Does the patient use Medicaid Transportation?: No  ? Next Level of Care (Acute Psych discharges only)      ? Discharge Disposition    Anticipated to DC home with IV abx and HHS.           Durable Medical Equipment      No service has been selected for the patient.      [place] Destination      No service has been selected for the patient.      [facility] - Selection Complete      Service Provider Request Status Selected Services Address Phone Number Fax Number    [facility] [address], [place] [address] [phone] [facility] - Selection Complete      Service Provider Request Status Selected Services Address Phone Number Fax Number    [facility] Selected Home Infusion and Injection [address], [place] [address] [phone]        Axel Filler, BSN, RN  Nurse Case Manager  Office: [phone]  Pager: [phone]

## 2018-08-05 NOTE — Progress Notes
Assumed care at 1900, no acute events this shift.  VSS,V pacedon tele. Tolerating RA.  Pt voiding adequately via urinal,lastBM7/8.  Paincontrolled with PRN tylenol.  MS incision dressing CDI, left chest incision with steri strips.  Fall bundle in place.  Pt sleptwell, no further needs, will continue to monitor.

## 2018-08-05 NOTE — Progress Notes
Medication Delivery Date/Place:  Medication & supplies to be delivered to patient's room at [facility] on 08/05/18.    Start of Care:  Home doses to begin on 08/05/18 with ampicillin/sulbactam. Vancomycin doses to begin on 08/06/18.    Teach:  Bedside teach to be conducted by [facility] RN, Marvell Fuller on 08/05/18.    Drug:  Vancomycin 1.5 gm  Frequency: Q 24 hours  Adm Method: Elastomeric  Adm Directions:  Administer Vancomycin 1.5 gm (300 ml) IV every 24 hours. Dose will infuse over approximately 105 minutes via SMARTeZ  device.    Drug: Ampicillin-Sulbactam 12 gm  Adm Method: CADD Pump  Adm Directions:  Administer Ampicillin-Sulbactam 12 gm IV over 24 hours via continuous infusion using CADD pump: Vol = 620 ml; Rate = 25 ml/hr.  Each bag will last: 24 hours    Duration of therapy: 6 weeks from 07/21/18    Access: PICC DL  Flush: NS 5-10 ml when needed; Heparin 10 units/ml 5 ml as final flush/lock    Lab Orders: CBC w/ Diff, CMP, Vancomycin trough - Weekly starting on Monday, 08/09/18  Fax results to: Dr Ventura Sellers [phone] & Gordy Levan [phone]    HH: [place]    MD: Arva Chafe    **Summarized from orders received via fax on 08/04/18 by Merrilee Jansky, Pharm.D.**

## 2018-08-05 NOTE — Progress Notes
Met with pt and son Dave to demonstrate the administration of Unasyn via CADD cont and Vanco via elastomeric q 24hr. Son was able to return demo independently. All meds and supplies were reviewed and he understands to change bag tonight at 2100 and next Vanco will be given at 1200 tomorrow..   They understand the roles of [facility] and HH. Report given to dc nurse Alicia.      BSN RN    Medication Delivery Date/Place:  Medication & supplies to be delivered to patient's room at [facility] on 08/05/18.    Start of Care:  Home doses to begin on 08/05/18 with ampicillin/sulbactam. Vancomycin doses to begin on 08/06/18.    Teach:  Bedside teach to be conducted by [facility] RN,   on 08/05/18.    Drug:  Vancomycin 1.5 gm  Frequency: Q 24 hours  Adm Method: Elastomeric  Adm Directions:  Administer Vancomycin 1.5 gm (300 ml) IV every 24 hours. Dose will infuse over approximately 105 minutes via SMARTeZ  device.    Drug: Ampicillin-Sulbactam 12 gm  Adm Method: CADD Pump  Adm Directions:  Administer Ampicillin-Sulbactam 12 gm IV over 24 hours via continuous infusion using CADD pump: Vol = 620 ml; Rate = 25 ml/hr.  Each bag will last: 24 hours    Duration of therapy: 6 weeks from 07/21/18    Access: PICC DL  Flush: NS 5-10 ml when needed; Heparin 10 units/ml 5 ml as final flush/lock    Lab Orders: CBC w/ Diff, CMP, Vancomycin trough - Weekly starting on Monday, 08/09/18  Fax results to: Dr Clough [phone] & [facility] [phone]    HH: [facility]    MD: Lisa Clough

## 2018-08-05 NOTE — Progress Notes
08/05/18 1000   Cardiac Rehab Activity   Distance Walked (feet) 360 ft   BP Pre-activity 131/65   BP Post-activity 156/87   HR Pre-activity 61 bpm   HR Post-activity 103   SaO2 Pre-activity 100 %   SaO2 Post-activity 100   O2 Device None (Room Air)   Comments Pt tolerated a full lap on RN unit with use of a roller walker and on RA. No complaints of pain. Assisted to chair at the end of walk. No other needs at this time. Supposed to discharge this afternoon.    Mobility   Progressive Mobility Level 8   Level of Assistance Stand by assistance   Assistive Device Walker   Time Tolerated 11-30 minutes   Activity Limited By No limitations

## 2018-08-05 NOTE — Progress Notes
08/05/18 1000   Current Cardiac Procedures/Events   Pre Admit Dx Aortic Stenosis   Valve 07/21/18   Risk Factors   Risk Factors Hypertension;Hyperlipidemia   Education   Person Instructed Patient   Patient Barriers To Learning None Noted   Interventions/Teaching Methods Verbal Instructions;Written Materials Provided   Patient Response Verbalized Understanding   Topics Wound Care;Sternal and Upperbody restrictions for OHS;Reasons to call your doctor;Risk Factor Management - Blood Pressure;Risk Factor Management - Diabetes;Risk Factor Management - Smoking;Risk Factor Management - Cholesterol;Risk Factor Management - Obesity;Risk Factor Management - Stress;Risk Factor Management - Physical Inactivity;Home Walking Guidelines;Outpatient Cardiac Rehab Referral Information   Teaching Completed 08/05/18   Heart Resource Manual Given 08/05/18   Comments Pt referred to [facility]. Phone number and contact information given.    Outpatient Cardiopulmonary Rehab   OPCR Yes   Location [facility]   Date Faxed 08/05/18

## 2018-08-09 ENCOUNTER — Encounter: Admit: 2018-08-09 | Discharge: 2018-08-09

## 2018-08-09 NOTE — Telephone Encounter
Patient Discharge Date: 7/9     Surgeon: Azzie Glatter     Surgery:  Aortic root replacement, aortic pseudoaneurysm repair, ascending aortic replacement via circulatory arrest    Spoke with patient regarding status post-discharge. He states "I've been doing well, and I really am feeling pretty good today". He states his BP/HR and weight "look good", he had a HHRN in his home today and they felt he was doing well. He states his incisions are CDI. He states he is walking, and is working towards his goals, he is aware of his cardiac rehab referral in [place]. He has a f/u apt with his PCP on Wednesday and is set up to see Dr. Ricard Dillon in [place] next month. Patient denies any issues/questions at this time, he verbalized understanding of education provided.

## 2018-08-11 ENCOUNTER — Encounter: Admit: 2018-08-11 | Discharge: 2018-08-11

## 2018-08-11 ENCOUNTER — Ambulatory Visit: Admit: 2018-08-11 | Discharge: 2018-08-11

## 2018-08-11 DIAGNOSIS — Z950 Presence of cardiac pacemaker: Secondary | ICD-10-CM

## 2018-08-11 DIAGNOSIS — Z792 Long term (current) use of antibiotics: Secondary | ICD-10-CM

## 2018-08-11 DIAGNOSIS — I442 Atrioventricular block, complete: Secondary | ICD-10-CM

## 2018-08-11 DIAGNOSIS — I38 Endocarditis, valve unspecified: Secondary | ICD-10-CM

## 2018-08-11 LAB — COMPREHENSIVE METABOLIC PANEL
Lab: 1 K/UL — ABNORMAL LOW (ref 1.8–7.0)
Lab: 17
Lab: 297
Lab: 3.4 % (ref >60)
Lab: 33
Lab: 35

## 2018-08-11 LAB — CBC AND DIFF

## 2018-08-12 ENCOUNTER — Encounter: Admit: 2018-08-12 | Discharge: 2018-08-12

## 2018-08-16 ENCOUNTER — Ambulatory Visit: Admit: 2018-08-16 | Discharge: 2018-08-16

## 2018-08-16 ENCOUNTER — Ambulatory Visit: Admit: 2018-08-16 | Discharge: 2018-08-17

## 2018-08-16 ENCOUNTER — Encounter: Admit: 2018-08-16 | Discharge: 2018-08-16

## 2018-08-16 DIAGNOSIS — Z792 Long term (current) use of antibiotics: Secondary | ICD-10-CM

## 2018-08-16 DIAGNOSIS — I10 Essential (primary) hypertension: Secondary | ICD-10-CM

## 2018-08-16 DIAGNOSIS — I38 Endocarditis, valve unspecified: Secondary | ICD-10-CM

## 2018-08-16 DIAGNOSIS — E079 Disorder of thyroid, unspecified: Secondary | ICD-10-CM

## 2018-08-16 DIAGNOSIS — G4733 Obstructive sleep apnea (adult) (pediatric): Secondary | ICD-10-CM

## 2018-08-16 DIAGNOSIS — Z952 Presence of prosthetic heart valve: Secondary | ICD-10-CM

## 2018-08-16 DIAGNOSIS — I724 Aneurysm of artery of lower extremity: Secondary | ICD-10-CM

## 2018-08-16 DIAGNOSIS — I214 Non-ST elevation (NSTEMI) myocardial infarction: Secondary | ICD-10-CM

## 2018-08-16 DIAGNOSIS — C4490 Unspecified malignant neoplasm of skin, unspecified: Secondary | ICD-10-CM

## 2018-08-16 DIAGNOSIS — G4730 Sleep apnea, unspecified: Secondary | ICD-10-CM

## 2018-08-16 DIAGNOSIS — I4891 Unspecified atrial fibrillation: Secondary | ICD-10-CM

## 2018-08-16 DIAGNOSIS — I330 Acute and subacute infective endocarditis: Secondary | ICD-10-CM

## 2018-08-16 DIAGNOSIS — C44621 Squamous cell carcinoma of skin of unspecified upper limb, including shoulder: Secondary | ICD-10-CM

## 2018-08-16 DIAGNOSIS — E785 Hyperlipidemia, unspecified: Secondary | ICD-10-CM

## 2018-08-16 DIAGNOSIS — I2510 Atherosclerotic heart disease of native coronary artery without angina pectoris: Secondary | ICD-10-CM

## 2018-08-16 DIAGNOSIS — R011 Cardiac murmur, unspecified: Secondary | ICD-10-CM

## 2018-08-16 DIAGNOSIS — I350 Nonrheumatic aortic (valve) stenosis: Secondary | ICD-10-CM

## 2018-08-16 DIAGNOSIS — M549 Dorsalgia, unspecified: Secondary | ICD-10-CM

## 2018-08-16 LAB — COMPREHENSIVE METABOLIC PANEL
Lab: 0.7 mg/dL (ref 0.3–1.2)
Lab: 1.1 mg/dL (ref 0.4–1.24)
Lab: 13 mg/dL (ref 7–25)
Lab: 141 MMOL/L — ABNORMAL LOW (ref 137–147)
Lab: 2.8 MMOL/L — ABNORMAL LOW (ref 3.5–5.1)
Lab: 26 MMOL/L (ref 21–30)
Lab: 3.4 g/dL — ABNORMAL LOW (ref 3.5–5.0)
Lab: 303 U/L — ABNORMAL HIGH (ref 25–110)
Lab: 31 U/L (ref 7–56)
Lab: 36 U/L (ref 7–40)
Lab: 5.9 g/dL — ABNORMAL LOW (ref 6.0–8.0)
Lab: 9 mg/dL (ref 8.5–10.6)
Lab: >60 mL/min (ref >60)
Lab: >60 mL/min (ref >60)

## 2018-08-16 LAB — CBC AND DIFF
Lab: 0 10*3/uL (ref 0–0.20)
Lab: 0.9 K/UL — ABNORMAL LOW (ref 1.0–4.8)
Lab: 31 pg (ref 26–34)
Lab: 4.1 M/UL — ABNORMAL LOW (ref 4.4–5.5)
Lab: 8.4 10*3/uL (ref 4.5–11.0)

## 2018-08-16 LAB — VANCOMYCIN TROUGH: Lab: 13 ug/mL (ref 10.0–20.0)

## 2018-08-16 NOTE — Telephone Encounter
CTS currently seeing patient and would like to make sure the person does not need to do anything else today in person from Dr. Ricard Dillon' perspective. Patient is having labs drawn.    Routed to Dr. Ricard Dillon for review and recommendations regarding AF. Patient is scheduled for follow up on 8/6 with Dr. Ricard Dillon.

## 2018-08-16 NOTE — Progress Notes
Subjective:       History of Present Illness  Joel Hernandez is a 78 y.o. male. With history of severe aortic stenosis, pseudoaneurysm w hematoma,  heart block w dual chamber PPM, CAD, HTN, OSA  And is s/p aortic root and ascending aortic replacement,    He was recently admitted to [facility] 07/21/18 to 08/04/18. During that hospitalization he underwent aortic root and ascending aorta replacement and repair of annulus pseudoaneurysm. During surgery he was noted to have a tremendous amount of inflammation posterior to aortic annulus and extending down to the dome of the atrium. This was concerning for infection. Pathology showed fibrinous exudate along with neutrophilic infiltrates, special stains negative for microorganisms Blood work was negative for Bartonella, Brucella, Coxiella, Chlamydia,  Tropheryma whipplei PCR neg. Broad Range PCR from the aortic leaflet was negative. [facility] 6/29 x 3 sets were negative. 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 He was discharged on Vanco, Amp/sulbactam and doxycyline.     He returns for FU today. His main complaint is very poor appetite. Food does not taste well, it makes him feel ill but not really nauseated. His son reports he has been eating less and less. No vomiting no diarrhea. He wonders if his thyroid is off as this was a problem before. He thinks his weight is down. He doesn't think he can cont like this.. We discussed atbx are possibly contributing. This would most likely be doxy. Given neg wu for atypical I think we can hold this for a few day.  He does not think nausea meds will help.   We reviewed the ID wu- all studies are negative.   His surgical wound is healing well. No fever chills or systemic signs of infection  He reports he has been staying in close contact w his PCP who has been following his labs.       He had labs on 08/09/18. WBC 11L, Hemoglobin stable at 11. Cr stable at 1.06, K 3.4 and LFT normal. Vanco trough 14       Review of Systems   Constitutional: Negative.    HENT: Negative.    Eyes: Negative.    Respiratory: Negative.    Cardiovascular: Negative.    Gastrointestinal: Negative.    Genitourinary: Negative.    Musculoskeletal: Negative.    Skin: Negative.    Neurological: Negative.    Psychiatric/Behavioral: Negative.          Objective:         ??? ampicillin/sulbactam (UNASYN) 3 g/8 mL in sodium chloride 0.9% (NS) 0.9 % 100 mL IVPB (MB+) Infuse 12 grams as a continuous infusion over 24 hours (12 grams per 24 hours)   ??? aspirin 81 mg chewable tablet Chew one tablet by mouth daily. Take with food.   ??? atorvastatin (LIPITOR) 40 mg tablet Take one tablet by mouth daily.   ??? clopiDOGrel (PLAVIX) 75 mg tablet Take one tablet by mouth daily.   ??? doxycycline (VIBRAMYCIN) 100 mg tablet Take one tablet by mouth twice daily.   ??? furosemide (LASIX) 40 mg tablet Take 1 tab by mouth twice daily x 1 week, then 1 tab by mouth daily   ??? losartan (COZAAR) 100 mg tablet Take one-half tablet by mouth daily.   ??? methIMAzole (TAPAZOLE) 10 mg tablet Take  by mouth twice daily. Take 1 tablet in the morning and 1.5 tablets in the evening   ??? metoprolol tartrate (  LOPRESSOR) 50 mg tablet Take one tablet by mouth twice daily.   ??? oxyCODONE/acetaminophen (PERCOCET) 5/325 mg tablet Take one tablet to two tablets by mouth every 6 hours as needed for Pain   ??? potassium chloride SR (K-DUR) 10 mEq tablet Take 1 tab by mouth twice a day x 7 days, then decrease to 1 tab by mouth daily   ??? senna/docusate (SENOKOT-S) 8.6/50 mg tablet Take two tablets by mouth twice daily.   ??? tamsulosin (FLOMAX) 0.4 mg capsule Take 1 capsule by mouth at bedtime daily.   ??? vancomycin (VANCOCIN) 1,500 mg in sodium chloride 0.9% (NS) 0.9 % 280 mL IVPB Administer one thousand five hundred mg through vein every 24 hours.     There were no vitals filed for this visit.  There is no height or weight on file to calculate BMI. Physical Exam  Constitutional:       Appearance: He is well-developed.   HENT:      Head: Normocephalic and atraumatic.   Eyes:      Conjunctiva/sclera: Conjunctivae normal.   Neck:      Musculoskeletal: Neck supple.   Cardiovascular:      Rate and Rhythm: Normal rate and regular rhythm.      Heart sounds: Normal heart sounds.   Pulmonary:      Effort: Pulmonary effort is normal.      Breath sounds: Normal breath sounds.   Abdominal:      General: Bowel sounds are normal.      Palpations: Abdomen is soft.   Musculoskeletal: Normal range of motion.   Skin:     General: Skin is warm and dry.      Comments: Mid sternal wound well approximated   Neurological:      Mental Status: He is oriented to person, place, and time.      Deep Tendon Reflexes: Reflexes are normal and symmetric.              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  -06/16/18 BC x 2 sets neg, no prodrome of fever, chills or recent dental intervention, endoscopy or other possible source aside from prior [facility]  -Discussed with pathology (Dr. Darrick Penna), fibrinous exudate along with neutrophilic infiltrates seen on tissue sample from 07/21/18 - special stains negative for microorganisms  -negative: Bartonella, Brucella, Coxiella, Chlamydia, serologies neg, Tropheryma whipplei PCR neg, Broad Range PCR neg (aortic leaflet)  -BC 6/29 x 3 sets NTD  ???  Severe aortic stenosis  Aortic annulus psuedoaneurysm with crescentic hematoma   S/p aortic root replacement, ascending aorta replacement, and repair of aortic annulus pseudoaneurysm 07/21/18  -06/10/18 CTA:???The proximal ascending aorta measures 45 mm The descending thoracic aorta measures 25 mm. There is a 2 cm x 2 cm comminuted fluid collection consistent with a perivalvular pseudoaneurysm surrounded by a crescentic hematoma located along the non-coronary cusp of the aortic valve with questionable visualized neck measuring 1mm. No mass effect. -06/29/18 CTA:???Increase in size of a perivalvular pseudoaneurysm posterior to the aortic root, likely arising from the left coronary cusp.  -07/21/18 Underwent aortic root replacement, ascending aorta replacement, and repair of aortic annulus pseudoaneurysm with op note describing???a tremendous amount of inflammation posterior to the aortic annulus and extending down onto the dome of the left atrium.  ???  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 Continue antibiotic therapy with vancomycin 1500 mg IV q 24hr, amp/sulbactam (12gm per 24hr continuous infusion)  2. Given the significant complaint of poor appetite, weight loss we will hold the doxycycline for 3 days to see if this improves. He will call the office later this week for update   3. Plan 6 wks IV atbx if tolerates (from 6/24)- anticipated stop date 08/31/18  4. Cont weekly cbc, cmp, vanc trough fax to [phone]  5. FU w PCP  6. PIC line can be removed at completion of therapy  7. Endocarditis ppx w dental procedures etc going forward, he reviewed this w CTS  8. FU in coordination w CTS    Loreta Ave MD    Complexity of medical decision making is high b/c of the multi-system nature of the infectious disease process and concerns about the complexity of the patient illness including the sensitivity of the organisms being treated, the potential for drug toxicity and interactions, concerns about immunologic function, and interplay of other issues.      Addendum: 08/17/18: Labs reviewed K 2.8, nl Cr. OPAT contacting PCP office to discuss diuretics, K replacement. If they are not able to address today, we will provide pt w recommendations for replacement.

## 2018-08-16 NOTE — Patient Instructions
You will need antibiotics prior to certain procedures. This helps prevent infection that could scar and destroy the heart valve. You will need to take this medication, such as before dental work, surgery, or medical procedures. This will be provided by cardiology, PCP or your dentist.    You will need yearly echocardiograms to follow your valve.     6 weeks from the date of surgery you may gradually increase the amount of weight that you are lifting. You should start at low weights and gradually work your way up. If there is no discomfort while doing it, then that is ok. If you have discomfort then you should stop. At 3 months from the date of surgery the sternum should be completely healed like it was never broken. If you have any questions or concerns please notify our office otherwise we feel no further surgical follow-up is warranted. You will continue to follow with your PCP and cardiologist for continued care. Thank you for the opportunity to participate in your care.

## 2018-08-16 NOTE — Progress Notes
Date of Service: 08/16/2018       Subjective:             Joel Hernandez is a 78 y.o. male.      History of Present Illness  Today we had the pleasure of seeing your patient, Joel Hernandez, in our office for routine postop follow up after aortic root replacement with 25 mm Medtronic Freestyle, repair of pseudoaneurysm of aortic annulus, replacement of the ascending aorta using 28 mm Gelweave graft, and the use of circulatory arrest performed by Dr. Sindy Messing III on 07/21/2018. He developed CHB and underwent PPM on 07/23/2018. He did suffer a vasovagal episode and was taken back to the OR on 07/24/2018 for redo sternotomy with evacuation of hematoma with Dr. Helen Hashimoto. He required inotropes for vasogenic shock and these were weaned down. There was concern of the endocarditis of the AV leaflets and ID saw him and recommended IV antibiotics at DC. Blodd cultures were negative. He was aggressively diuresed and had issues with urinary retention but this did ultimately resolve. He was seen by PT/OT and was discharged on 08/05/2018.     Since discharge Joel Hernandez states he has been doing well. He states he continues to have decreased appetite. He denies any fevers, drainage from his incisions or popping/clicking of his chest. He is ambulating with the assistance of a walker and continues to work with [facility]. When he completes [facility] he plans to participate in outpatient cardiac rehab which he has in the past. He continues to have IV antibiotics managed by ID but anticipates those being discontinued in August. He was seen by Dr. Nadara Eaton today while in our office. He has been noted to be in a.fib on his device checks and Dr. Barry Dienes was notified of these results. He states that his LE edema has continued to improve and his weight continues to steadily decrease.     He has seen his PCP and is scheduled to see Dr. Barry Dienes on 8/6.     Chest xray performed today revealed stable sternum with small left pleural effusion. He will need antibiotics prior to certain procedures. This helps prevent infection that could scar and destroy the heart valve. He will be given instructions on when to take this medication, such as before dental work, surgery, or medical procedures. He will need a baseline echocardiogram and then yearly after for continued surveillance.       Patient was informed that at 6 weeks from the date of surgery he may gradually increase the amount of weight that he is lifting. He should start at low weights and gradually work his way up. If there is no discomfort while doing it, then that is ok. If he has discomfort then he should stop. At 3 months from the date of surgery the sternum should be completely healed like it was never broken. If he has any questions or concerns he will notify our office otherwise we feel no further surgical follow-up is warranted. He will continue to follow with his PCP and cardiologist for continued care. Thank you for the opportunity to participate in the care of Joel Hernandez.       Review of Systems   Constitution: Negative.   HENT: Negative.    Eyes: Negative.    Cardiovascular: Negative.    Respiratory: Negative.    Endocrine: Negative.    Hematologic/Lymphatic: Negative.    Skin: Negative.    Musculoskeletal: Negative.    Gastrointestinal: Negative.  Genitourinary: Negative.    Neurological: Negative.    Psychiatric/Behavioral: Negative.    Allergic/Immunologic: Negative.          Objective:         ??? ampicillin/sulbactam (UNASYN) 3 g/8 mL in sodium chloride 0.9% (NS) 0.9 % 100 mL IVPB (MB+) Infuse 12 grams as a continuous infusion over 24 hours (12 grams per 24 hours)   ??? aspirin 81 mg chewable tablet Chew one tablet by mouth daily. Take with food.   ??? atorvastatin (LIPITOR) 40 mg tablet Take one tablet by mouth daily.   ??? clopiDOGrel (PLAVIX) 75 mg tablet Take one tablet by mouth daily.   ??? doxycycline (VIBRAMYCIN) 100 mg tablet Take one tablet by mouth twice daily. ??? furosemide (LASIX) 40 mg tablet Take 1 tab by mouth twice daily x 1 week, then 1 tab by mouth daily   ??? losartan (COZAAR) 100 mg tablet Take one-half tablet by mouth daily.   ??? methIMAzole (TAPAZOLE) 10 mg tablet Take  by mouth twice daily. Take 1 tablet in the morning and 1.5 tablets in the evening   ??? metoprolol tartrate (LOPRESSOR) 50 mg tablet Take one tablet by mouth twice daily.   ??? oxyCODONE/acetaminophen (PERCOCET) 5/325 mg tablet Take one tablet to two tablets by mouth every 6 hours as needed for Pain   ??? potassium chloride SR (K-DUR) 10 mEq tablet Take 1 tab by mouth twice a day x 7 days, then decrease to 1 tab by mouth daily   ??? senna/docusate (SENOKOT-S) 8.6/50 mg tablet Take two tablets by mouth twice daily.   ??? tamsulosin (FLOMAX) 0.4 mg capsule Take 1 capsule by mouth at bedtime daily.   ??? vancomycin (VANCOCIN) 1,500 mg in sodium chloride 0.9% (NS) 0.9 % 280 mL IVPB Administer one thousand five hundred mg through vein every 24 hours.     Vitals:    08/16/18 1121   BP: 92/62   BP Source: Arm, Left Upper   Pulse: 62   Temp: 36.4 ???C (97.5 ???F)   SpO2: 100%   Weight: 87.1 kg (192 lb)   Height: 1.829 m (6')   PainSc: Zero     Body mass index is 26.04 kg/m???.     Physical Exam  Constitutional:       Appearance: Normal appearance.   HENT:      Head: Normocephalic and atraumatic.   Eyes:      Extraocular Movements: Extraocular movements intact.      Conjunctiva/sclera: Conjunctivae normal.      Pupils: Pupils are equal, round, and reactive to light.   Neck:      Musculoskeletal: Normal range of motion and neck supple.   Cardiovascular:      Rate and Rhythm: Normal rate. Rhythm irregularly irregular.      Heart sounds: Normal heart sounds, S1 normal and S2 normal.   Pulmonary:      Effort: Pulmonary effort is normal.      Breath sounds: Examination of the left-lower field reveals decreased breath sounds. Decreased breath sounds present.   Abdominal:      Palpations: Abdomen is soft. Musculoskeletal: Normal range of motion.         General: Swelling: trace bilateral LE.   Skin:     General: Skin is warm and dry.      Comments: midsternal and left subclavian incisions are well healed and well approximated without exudate, erythema, or swelling. Sternum is stable with cough.     Neurological:  General: No focal deficit present.      Mental Status: He is alert and oriented to person, place, and time.   Psychiatric:         Mood and Affect: Mood normal.         Behavior: Behavior normal.         Thought Content: Thought content normal.         Judgment: Judgment normal.              Assessment and Plan:  Overall, we are very pleased with Joel Hernandez's postoperative progress. We have encouraged him to continue to increase his intake. He will need antibiotics prior to certain procedures. This helps prevent infection that could scar and destroy the heart valve. He will be given instructions on when to take this medication, such as before dental work, surgery, or medical procedures. He will need a baseline echocardiogram and then yearly after for continued surveillance.       Patient was informed that at 6 weeks from the date of surgery he may gradually increase the amount of weight that he is lifting. He should start at low weights and gradually work his way up. If there is no discomfort while doing it, then that is ok. If he has discomfort then he should stop. At 3 months from the date of surgery the sternum should be completely healed like it was never broken. If he has any questions or concerns he will notify our office otherwise we feel no further surgical follow-up is warranted. He will continue to follow with his PCP and cardiologist for continued care. Thank you for the opportunity to participate in the care of Joel Hernandez.    Hedwig Morton, FNP-C 08/16/2018 @ 1400.

## 2018-08-17 ENCOUNTER — Encounter: Admit: 2018-08-17 | Discharge: 2018-08-17

## 2018-08-17 DIAGNOSIS — M549 Dorsalgia, unspecified: Secondary | ICD-10-CM

## 2018-08-17 DIAGNOSIS — R011 Cardiac murmur, unspecified: Secondary | ICD-10-CM

## 2018-08-17 DIAGNOSIS — E079 Disorder of thyroid, unspecified: Secondary | ICD-10-CM

## 2018-08-17 DIAGNOSIS — I214 Non-ST elevation (NSTEMI) myocardial infarction: Secondary | ICD-10-CM

## 2018-08-17 DIAGNOSIS — G4730 Sleep apnea, unspecified: Secondary | ICD-10-CM

## 2018-08-17 DIAGNOSIS — G4733 Obstructive sleep apnea (adult) (pediatric): Secondary | ICD-10-CM

## 2018-08-17 DIAGNOSIS — I4891 Unspecified atrial fibrillation: Secondary | ICD-10-CM

## 2018-08-17 DIAGNOSIS — I10 Essential (primary) hypertension: Secondary | ICD-10-CM

## 2018-08-17 DIAGNOSIS — I724 Aneurysm of artery of lower extremity: Secondary | ICD-10-CM

## 2018-08-17 DIAGNOSIS — C44621 Squamous cell carcinoma of skin of unspecified upper limb, including shoulder: Secondary | ICD-10-CM

## 2018-08-17 DIAGNOSIS — I2510 Atherosclerotic heart disease of native coronary artery without angina pectoris: Secondary | ICD-10-CM

## 2018-08-17 DIAGNOSIS — C4490 Unspecified malignant neoplasm of skin, unspecified: Secondary | ICD-10-CM

## 2018-08-17 DIAGNOSIS — E785 Hyperlipidemia, unspecified: Secondary | ICD-10-CM

## 2018-08-17 DIAGNOSIS — I350 Nonrheumatic aortic (valve) stenosis: Secondary | ICD-10-CM

## 2018-08-23 ENCOUNTER — Encounter: Admit: 2018-08-23 | Discharge: 2018-08-23

## 2018-08-23 NOTE — Telephone Encounter
Phoned pt for F/U post AVR. He stated he is getting stronger every day. He mentioned that he had lost a lot of weight and had not started Cardiac Rehab quite yet but hopes to soon at the [facility]. He denied having having any hospitalizations since his dismissal from the [facility].

## 2018-08-24 ENCOUNTER — Encounter: Admit: 2018-08-24 | Discharge: 2018-08-24

## 2018-08-24 DIAGNOSIS — I38 Endocarditis, valve unspecified: Secondary | ICD-10-CM

## 2018-08-24 DIAGNOSIS — Z792 Long term (current) use of antibiotics: Secondary | ICD-10-CM

## 2018-08-24 LAB — COMPREHENSIVE METABOLIC PANEL
Lab: 0.8
Lab: 12
Lab: 295
Lab: 32
Lab: 35

## 2018-08-24 LAB — CBC AND DIFF: Lab: 9.6

## 2018-08-24 LAB — VANCOMYCIN TROUGH: Lab: 13

## 2018-08-25 ENCOUNTER — Encounter: Admit: 2018-08-25 | Discharge: 2018-08-25

## 2018-08-25 DIAGNOSIS — I38 Endocarditis, valve unspecified: Secondary | ICD-10-CM

## 2018-09-01 ENCOUNTER — Encounter: Admit: 2018-09-01 | Discharge: 2018-09-01

## 2018-09-01 DIAGNOSIS — Z792 Long term (current) use of antibiotics: Secondary | ICD-10-CM

## 2018-09-01 DIAGNOSIS — I38 Endocarditis, valve unspecified: Secondary | ICD-10-CM

## 2018-09-01 LAB — COMPREHENSIVE METABOLIC PANEL
Lab: 0.8
Lab: 11
Lab: 38
Lab: 39

## 2018-09-01 LAB — CBC AND DIFF: Lab: 7.6

## 2018-09-01 LAB — VANCOMYCIN TROUGH: Lab: 12

## 2018-09-02 ENCOUNTER — Encounter: Admit: 2018-09-02 | Discharge: 2018-09-02

## 2018-09-02 ENCOUNTER — Ambulatory Visit: Admit: 2018-09-02 | Discharge: 2018-09-03

## 2018-09-02 DIAGNOSIS — E785 Hyperlipidemia, unspecified: Secondary | ICD-10-CM

## 2018-09-02 DIAGNOSIS — I4819 Other persistent atrial fibrillation: Secondary | ICD-10-CM

## 2018-09-02 DIAGNOSIS — G4733 Obstructive sleep apnea (adult) (pediatric): Secondary | ICD-10-CM

## 2018-09-02 DIAGNOSIS — I2510 Atherosclerotic heart disease of native coronary artery without angina pectoris: Secondary | ICD-10-CM

## 2018-09-02 DIAGNOSIS — Z952 Presence of prosthetic heart valve: Secondary | ICD-10-CM

## 2018-09-02 DIAGNOSIS — I4891 Unspecified atrial fibrillation: Secondary | ICD-10-CM

## 2018-09-02 DIAGNOSIS — I724 Aneurysm of artery of lower extremity: Secondary | ICD-10-CM

## 2018-09-02 DIAGNOSIS — E7800 Pure hypercholesterolemia, unspecified: Secondary | ICD-10-CM

## 2018-09-02 DIAGNOSIS — I10 Essential (primary) hypertension: Secondary | ICD-10-CM

## 2018-09-02 DIAGNOSIS — M549 Dorsalgia, unspecified: Secondary | ICD-10-CM

## 2018-09-02 DIAGNOSIS — C4490 Unspecified malignant neoplasm of skin, unspecified: Secondary | ICD-10-CM

## 2018-09-02 DIAGNOSIS — E079 Disorder of thyroid, unspecified: Secondary | ICD-10-CM

## 2018-09-02 DIAGNOSIS — I214 Non-ST elevation (NSTEMI) myocardial infarction: Secondary | ICD-10-CM

## 2018-09-02 DIAGNOSIS — I5031 Acute diastolic (congestive) heart failure: Secondary | ICD-10-CM

## 2018-09-02 DIAGNOSIS — I350 Nonrheumatic aortic (valve) stenosis: Secondary | ICD-10-CM

## 2018-09-02 DIAGNOSIS — G4730 Sleep apnea, unspecified: Secondary | ICD-10-CM

## 2018-09-02 DIAGNOSIS — C44621 Squamous cell carcinoma of skin of unspecified upper limb, including shoulder: Secondary | ICD-10-CM

## 2018-09-02 DIAGNOSIS — R011 Cardiac murmur, unspecified: Secondary | ICD-10-CM

## 2018-09-02 MED ORDER — APIXABAN 5 MG PO TAB
5 mg | ORAL_TABLET | Freq: Two times a day (BID) | ORAL | 0 refills | Status: DC
Start: 2018-09-02 — End: 2018-09-14

## 2018-09-02 MED ORDER — LOSARTAN 100 MG PO TAB
100 mg | ORAL_TABLET | Freq: Every day | ORAL | 3 refills | 30.00000 days | Status: DC
Start: 2018-09-02 — End: 2018-11-18

## 2018-09-02 NOTE — Assessment & Plan Note
He has had intermittent atrial fibrillation for several years and is currently asymptomatic while in atrial fibrillation.  I am having him stop the Plavix and restart Eliquis today.  I am pretty neutral about whether to try to get him back into sinus rhythm because he seems to be entirely asymptomatic at this point.

## 2018-09-02 NOTE — Telephone Encounter
Confirmed with Anderson Malta, RN at [facility] that patient's PICC line was removed 09/01/18.

## 2018-09-02 NOTE — Progress Notes
Date of Service: 09/02/2018    Joel Hernandez is a 78 y.o. male.       HPI     Joel Hernandez was in the [place] clinic today for posthospitalization follow-up.  He was accompanied by his son, a Museum/gallery conservator, who has served as Higher education careers adviser.    Joel Hernandez had gone in for possible TAVR and ended up having highly complex disease that required repair of an aortic annulus pseudoaneurysm, replacement of the a sending aorta, and implantation of a bioprosthetic aortic valve.  The patient developed tamponade physiology on the third postoperative day requiring redo sternotomy for evacuation of pericardial hematoma.  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    There was concern for infective endocarditis involving the bioprosthetic valve leaflets and Doug went home on a multi week course of IV antibiotics.  The PICC line was discontinued yesterday.    He has had no problems with palpitation or TIA symptoms.  He has been in atrial fibrillation for about a month and there was some reluctance to restart oral anticoagulation soon postoperatively.  He has been treated with a combination of aspirin and Plavix.    He is back home now and has definitely demonstrated gradual improvement.  He is interested in getting started in phase 2 cardiac rehab and would like to operate an automobile again for trips around town.  His appetite is definitely getting better and he has not had any problems with pain.  He denies any fever, chills, or other manifestations of infection.  ???       Vitals:    09/02/18 1414 09/02/18 1427   BP: 138/82 136/80   BP Source: Arm, Left Upper Arm, Right Upper   Pulse: 64    Temp: 37.2 ???C (99 ???F)    SpO2: 98%    Weight: 85.8 kg (189 lb 3.2 oz)    Height: 1.829 m (6')    PainSc: Zero      Body mass index is 25.66 kg/m???.     Past Medical History  Patient Active Problem List    Diagnosis Date Noted   ??? Presence of permanent cardiac pacemaker 07/29/2018 07/23/18 - [employer] Dual Chamber Pacemaker Implantation     ??? Unplanned return to operating room with repeat sternotomy for cardiac tamponade 07/29/2018     07/24/18 - Redo sternotomy with evacuation of hematoma     ??? 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     ??? S/p bare metal coronary artery stent 07/06/2018     x2 05/2018     ??? Acute diastolic CHF (congestive heart failure) (HCC) 06/10/2018   ??? RBBB 06/10/2018   ??? Hypokalemia 06/10/2018   ??? Hypomagnesemia 06/10/2018   ??? Coronary artery disease due to lipid rich plaque 05/25/2018   ??? 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     04/2018-found incidentally on CTA but confirmed by groin duplex     ??? Foot drop 12/04/2016   ??? HLD (hyperlipidemia) 12/04/2016   ??? Spinal stenosis at L4-L5 level 12/04/2016   ??? Stenosis of lateral recess of lumbar spine 11/07/2016   ??? Coronary artery disease due to calcified coronary lesion 01/19/2016     12/2015 - NSTEMI, LAD stent     ??? Persistent atrial fibrillation (HCC) 01/18/2016     01/2016 - AF noted during hospitalization for unstable angina.  Attempt at DC cardioversion  unsuccessful.  DC'ed on amiodarone & Eliquis.    01/2016 - Successful outpatient cardioversion at [facility].  Amiodarone continued    02/2016 - Office follow-up--back in AF, asymptomatic with good rate control.     ??? Hypertension 09/15/2013     BP medication started in mid-30's     ??? Obstructive sleep apnea 09/15/2013     Circa 2004 - Diagnosis established, CPAP started (Dr. Mayford Knife, pulmonologist in [facility])           Review of Systems   Constitution: Positive for malaise/fatigue.   HENT: Negative.    Eyes: Negative.    Cardiovascular: Positive for leg swelling.   Respiratory: Positive for cough and sputum production.    Endocrine: Positive for polyuria.   Hematologic/Lymphatic: Negative.    Skin: Negative.    Musculoskeletal: Negative. Gastrointestinal: Negative.    Genitourinary: Positive for frequency and urgency.   Neurological: Positive for weakness.   Psychiatric/Behavioral: Negative.    Allergic/Immunologic: Negative.        Physical Exam    Physical Exam   General Appearance: no distress   Skin: warm, no ulcers or xanthomas   Digits and Nails: no cyanosis or clubbing   Eyes: conjunctivae and lids normal, pupils are equal and round   Teeth/Gums/Palate: dentition unremarkable, no lesions   Lips & Oral Mucosa: no pallor or cyanosis   Neck Veins: normal JVP , neck veins are not distended   Thyroid: no nodules, masses, tenderness or enlargement   Chest Inspection: chest is normal in appearance   Respiratory Effort: breathing comfortably, no respiratory distress   Auscultation/Percussion: lungs clear to auscultation, no rales or rhonchi, no wheezing   PMI: PMI not enlarged or displaced   Cardiac Rhythm: regular rhythm and normal rate   Cardiac Auscultation: S1, S2 normal, no rub, no gallop   Murmurs: grade 2 systolic murmur   Peripheral Circulation: normal peripheral circulation   Carotid Arteries: normal carotid upstroke bilaterally, no bruits   Radial Arteries: normal symmetric radial pulses   Abdominal Aorta: no abdominal aortic bruit   Pedal Pulses: normal symmetric pedal pulses   Lower Extremity Edema: no lower extremity edema   Abdominal Exam: soft, non-tender, no masses, bowel sounds normal   Liver & Spleen: no organomegaly   Gait & Station: walks without assistance   Muscle Strength: normal muscle tone   Orientation: oriented to time, place and person   Affect & Mood: appropriate and sustained affect   Language and Memory: patient responsive and seems to comprehend information   Neurologic Exam: neurological assessment grossly intact   Other: moves all extremities      Cardiovascular Studies    EKG:  Atrial fibrillation with 100% ventricular pacing    Problems Addressed Today  Encounter Diagnoses   Name Primary? ??? Other persistent atrial fibrillation (HCC) Yes   ??? Essential hypertension    ??? Persistent atrial fibrillation (HCC)    ??? Coronary artery disease due to calcified coronary lesion    ??? Pure hypercholesterolemia    ??? S/P AVR (aortic valve replacement) and aortoplasty    ??? Acute diastolic CHF (congestive heart failure) (HCC)        Assessment and Plan       Hypertension  I reviewed his home blood pressure log and just in the last week his systolic blood pressure has been in the 150s.  I am increasing his losartan back up to 100 mg/day.    Persistent atrial fibrillation (HCC)  He  has had intermittent atrial fibrillation for several years and is currently asymptomatic while in atrial fibrillation.  I am having him stop the Plavix and restart Eliquis today.  I am pretty neutral about whether to try to get him back into sinus rhythm because he seems to be entirely asymptomatic at this point.    Coronary artery disease due to calcified coronary lesion  No angina or other symptoms.    HLD (hyperlipidemia)  Lab Results   Component Value Date    CHOL 116 (L) 02/16/2018    TRIG 67 02/16/2018    HDL 39 02/16/2018    LDL 63 02/16/2018    VLDL 13 02/16/2018    NONHDLCHOL 122 01/19/2016    CHOLHDLC 3 02/16/2018          S/P AVR (aortic valve replacement) and aortoplasty  He is ready to get back into phase 2 cardiac rehab.  He is done well postoperatively and his incision looks fine.    Acute diastolic CHF (congestive heart failure) (HCC)  His last echocardiogram shows normal left ventricular systolic function and we should probably be able to get him off furosemide and potassium at this point.      Current Medications (including today's revisions)  ??? apixaban (ELIQUIS) 5 mg tablet Take one tablet by mouth twice daily.   ??? aspirin 81 mg chewable tablet Chew one tablet by mouth daily. Take with food.   ??? atorvastatin (LIPITOR) 40 mg tablet Take one tablet by mouth daily. ??? losartan (COZAAR) 100 mg tablet Take one tablet by mouth daily.   ??? methIMAzole (TAPAZOLE) 10 mg tablet Take  by mouth twice daily. Take 1 tablet in the morning and 1.5 tablets in the evening   ??? metoprolol tartrate (LOPRESSOR) 50 mg tablet Take one tablet by mouth twice daily.   ??? nitroglycerin (NITROSTAT) 0.4 mg tablet as Needed.   ??? tamsulosin (FLOMAX) 0.4 mg capsule Take 1 capsule by mouth at bedtime daily.

## 2018-09-02 NOTE — Assessment & Plan Note
No angina or other symptoms.

## 2018-09-02 NOTE — Assessment & Plan Note
His last echocardiogram shows normal left ventricular systolic function and we should probably be able to get him off furosemide and potassium at this point.

## 2018-09-02 NOTE — Assessment & Plan Note
I reviewed his home blood pressure log and just in the last week his systolic blood pressure has been in the 150s.  I am increasing his losartan back up to 100 mg/day.

## 2018-09-02 NOTE — Assessment & Plan Note
Lab Results   Component Value Date    CHOL 116 (L) 02/16/2018    TRIG 67 02/16/2018    HDL 39 02/16/2018    LDL 63 02/16/2018    VLDL 13 02/16/2018    NONHDLCHOL 122 01/19/2016    CHOLHDLC 3 02/16/2018

## 2018-09-02 NOTE — Assessment & Plan Note
He is ready to get back into phase 2 cardiac rehab.  He is done well postoperatively and his incision looks fine.

## 2018-09-07 ENCOUNTER — Encounter: Admit: 2018-09-07 | Discharge: 2018-09-07

## 2018-09-07 DIAGNOSIS — Z952 Presence of prosthetic heart valve: Secondary | ICD-10-CM

## 2018-09-07 DIAGNOSIS — I5031 Acute diastolic (congestive) heart failure: Secondary | ICD-10-CM

## 2018-09-07 DIAGNOSIS — I4819 Other persistent atrial fibrillation: Secondary | ICD-10-CM

## 2018-09-07 NOTE — Progress Notes
Date of Service: 09/08/2018     Subjective:             Joel Hernandez is a 78 y.o. male    History of Present Illness    78 year old male with history of aortic stenosis, CAD s/p PCI (04/2018), HTN, OSA, BPH admitted for aortic valve and root replacement, complicated by postoperative vasovagal/code event POD#3 with echo revealing fluid around the heart requiring mediastinal exploration, postoperatively with urinary retention.  Patient reports he was intermittent catheterize twice with residuals over 700 with each catheterization.  He reports a catheter was placed and was eventually removed prior to his discharge.  Patient reports he has been overall doing well after hospitalization reports no changes in his overall urination status other than an increase in nocturia.    Patient reports prior to hospitalization frequent urination every 2 hours with intermittent urgency.  He also notes some intermittent leakage with urgency if he is unable to make to the bathroom quickly enough.  He reports overall a slow but steady stream.  He does note some occasional dribbling towards the end of urination.  He reports hesitancy without pushing/strain to urinate.  He reports that he feels as though he empties his bladder about 50% of the time.  He notes nocturia x2 previously but now has had up about 3 or 4 times at night.    He denies history of urinary tract infections.  He reports one episode of hematuria following back surgery and catheter placement.  He denies any additional hematuria outside that episode.  He denies history of kidney disease, kidney/bladder stones diabetes mellitus.he does report history of spinal cord decompression and lumbar laminectomy completed by Dr. Sherryll Burger in 2018.      He reports some concerns with erectile dysfunction at this time.  He reports his wife passed away in 2022-04-22 and he is currently does not have a new partner.  He reports that his wife is not interested in sexual intercourse prior to her passing.  He believes that his erectile dysfunction has been occurring over the last couple years.  He reports difficulties both achieving and maintaining erections.  He reports he is currently not able to achieve any partial erections.  He denies decreased libido, history of testosterone, pain associate with erection/ejaculation, chronic groin/pelvic perineal/testicular pain, penile curvature.  He reports previous prescription for Viagra that either did the trick or did not.  He currently has access to nitrates but reports he is never had to use these previously.  He is interested in additional treatment options at this time.    PVR: 120    Medical History:   Diagnosis Date   ??? Aortic stenosis    ??? Atrial fibrillation (HCC)    ??? Back pain    ??? CAD (coronary artery disease)    ??? Disorder of thyroid gland    ??? Heart murmur 09/15/2013   ??? Hyperlipidemia    ??? Hypertension 09/15/2013   ??? NSTEMI (non-ST elevated myocardial infarction) (HCC) 12/2015    s/p stent placement   ??? OSA on CPAP     No CPAP use after 55lb weight loss   ??? 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 SCC (squamous cell carcinoma), arm    ??? Severe aortic stenosis    ??? Skin cancer    ??? Sleep apnea        Surgical History:  Procedure Laterality Date   ??? LUMBAR LAMINECTOMY  2004    L4-5   ??? LUMBAR LAMINECTOMY  2011    Hemilaminectomies L 2-3 L3-4   ??? RIGHT LUMBAR 4-5 REDO LAMINECTOMY AND LATERAL RECESS DECOMPRESSION Right 12/04/2016    Performed by Theotis Barrio, MD at [facility] OR   ??? LAMINECTOMY/ FACETECTOMY/ FORAMINOTOMY WITH DECOMPRESSION - 1 VERTEBRAL SEGMENT - LUMBAR  12/04/2016    Performed by Theotis Barrio, MD at [facility] OR   ??? CORONARY STENT PLACEMENT  12/2016   ??? KNEE REPLACEMENT Right 08/2017   ??? ANGIOGRAPHY CORONARY ARTERY WITH LEFT HEART CATHETERIZATION N/A 05/24/2018    Performed by Marcell Barlow, MD at [facility] CATH LAB ??? POSSIBLE PERCUTANEOUS CORONARY STENT PLACEMENT WITH ANGIOPLASTY N/A 05/24/2018    Performed by Marcell Barlow, MD at [facility] CATH LAB   ??? CLOSURE PSEUDOANEURYSM BY INJECTION- LOWER EXTREMITY N/A 05/26/2018    Performed by Marcell Barlow, MD at [facility] CATH LAB   ??? CORONARY STENT PLACEMENT  05/2018    x2   ??? Aortic Root replacement (Freestyle)/Implantation of coronary artery buttons, Circ arrest N/A 07/21/2018    Performed by Earlene Plater (Trip) L III, MD at [facility] CVOR   ??? INSERTION/ REPLACEMENT PERMANENT PACEMAKER WITH ATRIAL AND VENTRICULAR LEAD Left 07/23/2018    Performed by Lorain Childes, MD at [facility] EP LAB   ??? FLUOROSCOPY - CARDIAC  07/23/2018    Performed by Lorain Childes, MD at [facility] EP LAB   ??? EXPLORATION POSTOPERATIVE WOUND - CHEST N/A 07/24/2018    Performed by Earlene Plater (Trip) L III, MD at [facility] CVOR   ??? COLONOSCOPY     ??? HEART CATHETERIZATION  01/2016, 05/2018    Stent x 1 LAD placement following NSTEMI, 2 stents placed 04/2018   ??? HX HEART CATHETERIZATION     ??? SKIN CANCER EXCISION  2017, 2018    SCC Arm        Family History   Problem Relation Age of Onset   ??? Heart Disease Mother        Current Outpatient Medications   Medication Sig Dispense Refill   ??? apixaban (ELIQUIS) 5 mg tablet Take one tablet by mouth twice daily. 60 tablet    ??? aspirin 81 mg chewable tablet Chew one tablet by mouth daily. Take with food. 90 tablet    ??? atorvastatin (LIPITOR) 40 mg tablet Take one tablet by mouth daily. 90 tablet 3   ??? finasteride (PROSCAR) 5 mg tablet Take one tablet by mouth daily. 90 tablet 3   ??? losartan (COZAAR) 100 mg tablet Take one tablet by mouth daily. 90 tablet 3   ??? methIMAzole (TAPAZOLE) 10 mg tablet Take  by mouth twice daily. Take 1 tablet in the morning and 1.5 tablets in the evening     ??? metoprolol tartrate (LOPRESSOR) 50 mg tablet Take one tablet by mouth twice daily. 360 tablet 3   ??? nitroglycerin (NITROSTAT) 0.4 mg tablet as Needed. ??? tamsulosin (FLOMAX) 0.4 mg capsule Take 1 capsule by mouth at bedtime daily.       No current facility-administered medications for this visit.        Allergies   Allergen Reactions   ??? Lisinopril SEE COMMENTS     Reports respiratory distress and back pain       Social History     Socioeconomic History   ??? Marital status: Married     Spouse name: Not on file   ??? Number  of children: Not on file   ??? Years of education: Not on file   ??? Highest education level: Not on file   Occupational History   ??? Not on file   Tobacco Use   ??? Smoking status: Former Smoker     Packs/day: 0.50     Years: 15.00     Pack years: 7.50     Types: Cigarettes     Last attempt to quit: 11/20/1969     Years since quitting: 48.8   ??? Smokeless tobacco: Never Used   Substance and Sexual Activity   ??? Alcohol use: No   ??? Drug use: Never   ??? Sexual activity: Not on file   Other Topics Concern   ??? Not on file   Social History Narrative   ??? Not on file         Review of Systems   Constitutional: Negative for activity change, appetite change, chills, diaphoresis, fatigue, fever and unexpected weight change.   HENT: Negative for congestion, hearing loss, mouth sores and sinus pressure.    Eyes: Negative for visual disturbance.   Respiratory: Negative for apnea, cough, chest tightness and shortness of breath.    Cardiovascular: Negative for chest pain, palpitations and leg swelling.   Gastrointestinal: Negative for abdominal pain, blood in stool, constipation, diarrhea, nausea, rectal pain and vomiting.   Genitourinary: Negative for decreased urine volume, difficulty urinating, discharge, dysuria, enuresis, flank pain, frequency, genital sores, hematuria, penile pain, penile swelling, scrotal swelling, testicular pain and urgency.   Musculoskeletal: Negative for arthralgias, back pain, gait problem and myalgias.   Skin: Negative for rash and wound.   Neurological: Negative for dizziness, tremors, seizures, syncope, weakness, light-headedness, numbness and headaches.   Hematological: Negative for adenopathy. Does not bruise/bleed easily.   Psychiatric/Behavioral: Negative for decreased concentration and dysphoric mood. The patient is not nervous/anxious.        Objective:         ??? apixaban (ELIQUIS) 5 mg tablet Take one tablet by mouth twice daily.   ??? aspirin 81 mg chewable tablet Chew one tablet by mouth daily. Take with food.   ??? atorvastatin (LIPITOR) 40 mg tablet Take one tablet by mouth daily.   ??? finasteride (PROSCAR) 5 mg tablet Take one tablet by mouth daily.   ??? losartan (COZAAR) 100 mg tablet Take one tablet by mouth daily.   ??? methIMAzole (TAPAZOLE) 10 mg tablet Take  by mouth twice daily. Take 1 tablet in the morning and 1.5 tablets in the evening   ??? metoprolol tartrate (LOPRESSOR) 50 mg tablet Take one tablet by mouth twice daily.   ??? nitroglycerin (NITROSTAT) 0.4 mg tablet as Needed.   ??? tamsulosin (FLOMAX) 0.4 mg capsule Take 1 capsule by mouth at bedtime daily.     Vitals:    09/08/18 1025   BP: 130/78   Pulse: 68   Temp: 36.9 ???C (98.4 ???F)   Weight: 85.7 kg (189 lb)   Height: 182.9 cm (72)   PainSc: Zero     Body mass index is 25.63 kg/m???.       Physical Exam   Constitutional: He is oriented to person, place, and time. He appears well-developed and well-nourished. No distress.   HENT:   Head: Normocephalic and atraumatic.   Eyes: Conjunctivae are normal. Right eye exhibits no discharge. Left eye exhibits no discharge.   Neck: Normal range of motion.   Cardiovascular: Normal rate.   Pulmonary/Chest: Effort normal. No respiratory distress.  Genitourinary:    Genitourinary Comments: Defered. Will perform at next visit.     Musculoskeletal:         General: No deformity or edema.   Neurological: He is alert and oriented to person, place, and time.   Skin: Skin is warm and dry. He is not diaphoretic.   Psychiatric: He has a normal mood and affect. His behavior is normal. Judgment and thought content normal. 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  Discussed watchful waiting and continuing to monitor symptoms.  Discussed medications classes including Alpha1-Blockers and 5-Alpha Reductase Inhibitors to help reduce symptoms. Provided information about the mechanism of action, risks, benefits and adverse effects of both classes.     Discussed the risks and benefits of additional testing and how they could provide added benefit to future treatment plans if neccessary.     After significant consideration, the patient would like to proceed with continue to take Flomax 0.4 mg daily.  Patient reports plan to switch this medication from before bedtime to in the morning.  Plan to start Proscar once daily.  Plan to return for continued valuation of urinary symptoms in 6 months.  Instructed patient to call clinic if he noticed worsening lower urine tract symptoms or developed obstructive symptoms.       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  Discussed further work-up & evaluation.  Discussed ED tx options:  -- PDE-5 inhibitors  -- Vacuum Erection Device (VED)  -- Penile constriction band  -- Penile injections (ICI)  -- Penile prosthesis    Patient reports he is currently not interested in additional treatment options at this time due to lack of partner.  Discussed with patient inability to prescribe PDE 5 inhibitors due to his current access to nitrates.  Patient reports interest in penile injections.  Patient will call to schedule additional appointment if he finds a new partner. Orders Placed This Encounter   ??? finasteride (PROSCAR) 5 mg tablet     Return in about 6 months (around 03/11/2019) for RTC BPH.      Abby Potash, APRN-NP  Department of Urology

## 2018-09-07 NOTE — Telephone Encounter
Received call from Opal Sidles, pt's daughter.  They are requesting an order for cardiac rehab to be sent to [facility].      From Dr. Ricard Dillon' OV notes:  1.  Stop clopidogrel (Plavix)  2.  Stop furosemide (Lasix) and potassium  3.  Increase losartan to 100 mg/day  4.  Start Eliquis 5 mg twice daily  5.  Call Cardiac Rehab to get started  6.  OK to drive a car    Sent referral for Cardiac Rehab to [facility].  Notified Opal Sidles that order had been sent.

## 2018-09-08 ENCOUNTER — Encounter: Admit: 2018-09-08 | Discharge: 2018-09-08

## 2018-09-08 ENCOUNTER — Ambulatory Visit: Admit: 2018-09-08 | Discharge: 2018-09-09

## 2018-09-08 DIAGNOSIS — I10 Essential (primary) hypertension: Secondary | ICD-10-CM

## 2018-09-08 DIAGNOSIS — I350 Nonrheumatic aortic (valve) stenosis: Secondary | ICD-10-CM

## 2018-09-08 DIAGNOSIS — G4733 Obstructive sleep apnea (adult) (pediatric): Secondary | ICD-10-CM

## 2018-09-08 DIAGNOSIS — I4891 Unspecified atrial fibrillation: Secondary | ICD-10-CM

## 2018-09-08 DIAGNOSIS — M549 Dorsalgia, unspecified: Secondary | ICD-10-CM

## 2018-09-08 DIAGNOSIS — C44621 Squamous cell carcinoma of skin of unspecified upper limb, including shoulder: Secondary | ICD-10-CM

## 2018-09-08 DIAGNOSIS — I2510 Atherosclerotic heart disease of native coronary artery without angina pectoris: Secondary | ICD-10-CM

## 2018-09-08 DIAGNOSIS — E079 Disorder of thyroid, unspecified: Secondary | ICD-10-CM

## 2018-09-08 DIAGNOSIS — E785 Hyperlipidemia, unspecified: Secondary | ICD-10-CM

## 2018-09-08 DIAGNOSIS — G4730 Sleep apnea, unspecified: Secondary | ICD-10-CM

## 2018-09-08 DIAGNOSIS — C4490 Unspecified malignant neoplasm of skin, unspecified: Secondary | ICD-10-CM

## 2018-09-08 DIAGNOSIS — I214 Non-ST elevation (NSTEMI) myocardial infarction: Secondary | ICD-10-CM

## 2018-09-08 DIAGNOSIS — R011 Cardiac murmur, unspecified: Secondary | ICD-10-CM

## 2018-09-08 DIAGNOSIS — I724 Aneurysm of artery of lower extremity: Secondary | ICD-10-CM

## 2018-09-08 DIAGNOSIS — R3914 Feeling of incomplete bladder emptying: Secondary | ICD-10-CM

## 2018-09-08 MED ORDER — FINASTERIDE 5 MG PO TAB
5 mg | ORAL_TABLET | Freq: Every day | ORAL | 3 refills | Status: DC
Start: 2018-09-08 — End: 2019-08-23

## 2018-09-08 NOTE — Assessment & Plan Note
Discussed possible etiology for obstructive LUTS & elevated post-void residual volume.  Discussed watchful waiting and continuing to monitor symptoms.  Discussed medications classes including Alpha1-Blockers and 5-Alpha Reductase Inhibitors to help reduce symptoms. Provided information about the mechanism of action, risks, benefits and adverse effects of both classes.     Discussed the risks and benefits of additional testing and how they could provide added benefit to future treatment plans if neccessary.     After significant consideration, the patient would like to proceed with continue to take Flomax 0.4 mg daily.  Patient reports plan to switch this medication from before bedtime to in the morning.  Plan to start Proscar once daily.  Plan to return for continued valuation of urinary symptoms in 6 months.  Instructed patient to call clinic if he noticed worsening lower urine tract symptoms or developed obstructive symptoms.

## 2018-09-08 NOTE — Assessment & Plan Note
Discussed possible etiologies of ED, including but not limited to vascular, neurological, pharmaceutical, hormonal, situational, & psychological.  Discussed further work-up & evaluation.  Discussed ED tx options:  -- PDE-5 inhibitors  -- Vacuum Erection Device (VED)  -- Penile constriction band  -- Penile injections (ICI)  -- Penile prosthesis    Patient reports he is currently not interested in additional treatment options at this time due to lack of partner.  Discussed with patient inability to prescribe PDE 5 inhibitors due to his current access to nitrates.  Patient reports interest in penile injections.  Patient will call to schedule additional appointment if he finds a new partner.

## 2018-09-08 NOTE — Progress Notes
PVR = 120 mL

## 2018-09-09 DIAGNOSIS — N521 Erectile dysfunction due to diseases classified elsewhere: Secondary | ICD-10-CM

## 2018-09-09 DIAGNOSIS — N401 Enlarged prostate with lower urinary tract symptoms: Secondary | ICD-10-CM

## 2018-09-14 ENCOUNTER — Encounter: Admit: 2018-09-14 | Discharge: 2018-09-14

## 2018-09-14 MED ORDER — APIXABAN 5 MG PO TAB
5 mg | ORAL_TABLET | Freq: Two times a day (BID) | ORAL | 11 refills | Status: DC
Start: 2018-09-14 — End: 2019-09-12

## 2018-09-16 ENCOUNTER — Ambulatory Visit: Admit: 2018-09-16 | Discharge: 2018-09-16

## 2018-09-16 DIAGNOSIS — I38 Endocarditis, valve unspecified: Secondary | ICD-10-CM

## 2018-09-16 DIAGNOSIS — Z952 Presence of prosthetic heart valve: Principal | ICD-10-CM

## 2018-09-19 NOTE — Progress Notes
Obtained patient's verbal consent to treat them and their agreement to [facility] financial policy and NPP via this telehealth visit during the [facility]      Subjective:       History of Present Illness  Joel Hernandez is a 78 y.o. male. With history of severe aortic stenosis, pseudoaneurysm w hematoma,  heart block w dual chamber PPM, CAD, HTN, OSA  And is s/p aortic root and ascending aortic replacement,    He was recently admitted to [facility] 07/21/18 to 08/04/18. During that hospitalization he underwent aortic root and ascending aorta replacement and repair of annulus pseudoaneurysm. During surgery he was noted to have a tremendous amount of inflammation posterior to aortic annulus and extending down to the dome of the atrium. This was concerning for infection. Pathology showed fibrinous exudate along with neutrophilic infiltrates, special stains negative for microorganisms Blood work was negative for Bartonella, Brucella, Coxiella, Chlamydia,  Tropheryma whipplei PCR neg. Broad Range PCR from the aortic leaflet was negative. [facility] 6/29 x 3 sets were negative. 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 He was discharged on Vanco, Amp/sulbactam and doxycyline.     Interval:  He reports doing well. He is in cardiac rehab. His reports making progress and has regained more than 50% of strength.     He completed IV atbx regimen. No long term SE. He is glad to be finished as he found them difficult and inconvenient to take.     No fever, chills or other systemic s/s of infection. Sternal wound is healed well. No redness or swelling.   PIC was removed without difficulty    He saw Dr Barry Dienes his cardiologist recently. No concerns from that appt.        Review of Systems   Constitutional: Negative.    HENT: Negative.    Eyes: Negative.    Respiratory: Negative.    Cardiovascular: Negative.    Gastrointestinal: Negative. Genitourinary: Negative.    Musculoskeletal: Negative.    Skin: Negative.    Neurological: Negative.    Psychiatric/Behavioral: Negative.          Objective:         ??? apixaban (ELIQUIS) 5 mg tablet Take one tablet by mouth twice daily.   ??? aspirin 81 mg chewable tablet Chew one tablet by mouth daily. Take with food.   ??? atorvastatin (LIPITOR) 40 mg tablet Take one tablet by mouth daily.   ??? finasteride (PROSCAR) 5 mg tablet Take one tablet by mouth daily.   ??? losartan (COZAAR) 100 mg tablet Take one tablet by mouth daily.   ??? methIMAzole (TAPAZOLE) 10 mg tablet Take  by mouth twice daily. Take 1 tablet in the morning and 1.5 tablets in the evening   ??? metoprolol tartrate (LOPRESSOR) 50 mg tablet Take one tablet by mouth twice daily.   ??? nitroglycerin (NITROSTAT) 0.4 mg tablet as Needed.   ??? tamsulosin (FLOMAX) 0.4 mg capsule Take 1 capsule by mouth at bedtime daily.     There were no vitals filed for this visit.  There is no height or weight on file to calculate BMI.     Physical Exam  Constitutional:       Appearance: He is well-developed.   HENT:      Head: Normocephalic and atraumatic.   Eyes:      Extraocular Movements: Extraocular movements intact.   Cardiovascular:  Heart sounds: Normal heart sounds.   Pulmonary:      Effort: Pulmonary effort is normal.   Skin:     Comments: Mid sternal wound well approximated   Neurological:      Mental Status: He is alert and oriented to person, place, and time.      Deep Tendon Reflexes: Reflexes are normal and symmetric.              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  -06/16/18 BC x 2 sets neg, no prodrome of fever, chills or recent dental intervention, endoscopy or other possible source aside from prior [facility]  -Discussed with pathology (Dr. Darrick Penna), fibrinous exudate along with neutrophilic infiltrates seen on tissue sample from 07/21/18 - special stains negative for microorganisms -negative: Bartonella, Brucella, Coxiella, Chlamydia, serologies neg, Tropheryma whipplei PCR neg, Broad Range PCR neg (aortic leaflet)  -BC 6/29 x 3 sets NTD  -Completed 6 wk course of Vanco, Amp/sulbactam and doxy on 08/31/2018  ???  Severe aortic stenosis  Aortic annulus psuedoaneurysm with crescentic hematoma   S/p aortic root replacement, ascending aorta replacement, and repair of aortic annulus pseudoaneurysm 07/21/18  -06/10/18 CTA:???The proximal ascending aorta measures 45 mm The descending thoracic aorta measures 25 mm. There is a 2 cm x 2 cm comminuted fluid collection consistent with a perivalvular pseudoaneurysm surrounded by a crescentic hematoma located along the non-coronary cusp of the aortic valve with questionable visualized neck measuring 1mm. No mass effect.  -06/29/18 CTA:???Increase in size of a perivalvular pseudoaneurysm posterior to the aortic root, likely arising from the left coronary cusp.  -07/21/18 Underwent aortic root replacement, ascending aorta replacement, and repair of aortic annulus pseudoaneurysm with op note describing???a tremendous amount of inflammation posterior to the aortic annulus and extending down onto the dome of the left atrium.  ???  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 Continue to observe off antibiotics  2. Endocarditis ppx w dental procedures etc going forward, he reviewed this w CTS as well as our clinic and he is aware of recommendation  3. TTE for baseline. I will order this for cardiology office in [place]. CTS indicated he should have yearly echo for ongoing surveillance. Will defer to Dr Barry Dienes   4. FU w cardiology and PCP  5. No ID clinic fu indicated. He has our contact information and will call if questions or concerns arise     Loreta Ave MD    Complexity of medical decision making is high b/c of the multi-system nature of the infectious disease process and concerns about the complexity of the patient illness including the sensitivity of the organisms being treated, the potential for drug toxicity and interactions, concerns about immunologic function, and interplay of other issues.    Greater than 15 minutes spent with pt with >50% of the time counseling and coordinating care plus documentation on the day of the visit.  This consultation time included discussion of medical issues, interpretation of data and educating pt as to their condition and treatment options (and potential risks and benefits).

## 2018-09-19 NOTE — Patient Instructions
Our clinic nurses are Kaite, Marci and Erin. The clinic phone number are [phone] and [phone]    We will contact you by phone or 'mychart' with any test results when they are available.    It is important to me that you have all of your questions answered.   If you have any questions or concerns, please contact our nurses at the numbers above or through your mychart account   Please let the nurses know if anything is unclear, and I will be happy to call you to discuss further.    Your time is important and if you had to wait at all today, I apologize. My goal is to run exactly on time; however, on occasion, I get behind in clinic due to unexpected patient issues.    Your satisfaction with the care you received today is important to me. Please contact us directly via phone or MyChart if you have any concerns.     It was nice to see you today.     Dr Adara Kittle

## 2018-09-24 LAB — LEGIONELLA AB IGG

## 2018-10-11 ENCOUNTER — Ambulatory Visit: Admit: 2018-10-11 | Discharge: 2018-10-12 | Payer: MEDICARE

## 2018-10-11 ENCOUNTER — Encounter: Admit: 2018-10-11 | Discharge: 2018-10-11 | Payer: MEDICARE

## 2018-11-10 ENCOUNTER — Ambulatory Visit: Admit: 2018-11-10 | Discharge: 2018-11-10 | Payer: MEDICARE

## 2018-11-10 DIAGNOSIS — Z950 Presence of cardiac pacemaker: Secondary | ICD-10-CM

## 2018-11-18 ENCOUNTER — Encounter: Admit: 2018-11-18 | Discharge: 2018-11-18 | Payer: MEDICARE

## 2018-11-18 DIAGNOSIS — I350 Nonrheumatic aortic (valve) stenosis: Secondary | ICD-10-CM

## 2018-11-18 DIAGNOSIS — Z950 Presence of cardiac pacemaker: Secondary | ICD-10-CM

## 2018-11-18 DIAGNOSIS — I10 Essential (primary) hypertension: Secondary | ICD-10-CM

## 2018-11-18 DIAGNOSIS — E079 Disorder of thyroid, unspecified: Secondary | ICD-10-CM

## 2018-11-18 DIAGNOSIS — I724 Aneurysm of artery of lower extremity: Secondary | ICD-10-CM

## 2018-11-18 DIAGNOSIS — I4821 Permanent atrial fibrillation: Secondary | ICD-10-CM

## 2018-11-18 DIAGNOSIS — C44621 Squamous cell carcinoma of skin of unspecified upper limb, including shoulder: Secondary | ICD-10-CM

## 2018-11-18 DIAGNOSIS — R011 Cardiac murmur, unspecified: Secondary | ICD-10-CM

## 2018-11-18 DIAGNOSIS — G4730 Sleep apnea, unspecified: Secondary | ICD-10-CM

## 2018-11-18 DIAGNOSIS — I4891 Unspecified atrial fibrillation: Secondary | ICD-10-CM

## 2018-11-18 DIAGNOSIS — G4733 Obstructive sleep apnea (adult) (pediatric): Secondary | ICD-10-CM

## 2018-11-18 DIAGNOSIS — M549 Dorsalgia, unspecified: Secondary | ICD-10-CM

## 2018-11-18 DIAGNOSIS — E785 Hyperlipidemia, unspecified: Secondary | ICD-10-CM

## 2018-11-18 DIAGNOSIS — Z952 Presence of prosthetic heart valve: Secondary | ICD-10-CM

## 2018-11-18 DIAGNOSIS — I2510 Atherosclerotic heart disease of native coronary artery without angina pectoris: Secondary | ICD-10-CM

## 2018-11-18 DIAGNOSIS — C4490 Unspecified malignant neoplasm of skin, unspecified: Secondary | ICD-10-CM

## 2018-11-18 DIAGNOSIS — I214 Non-ST elevation (NSTEMI) myocardial infarction: Secondary | ICD-10-CM

## 2018-11-18 MED ORDER — LOSARTAN 50 MG PO TAB
50 mg | ORAL_TABLET | Freq: Every day | ORAL | 3 refills | 30.00000 days | Status: DC
Start: 2018-11-18 — End: 2019-06-03

## 2018-11-18 MED ORDER — SILDENAFIL 100 MG PO TAB
100 mg | ORAL_TABLET | ORAL | 0 refills | 25.00000 days | Status: AC | PRN
Start: 2018-11-18 — End: ?

## 2018-11-18 NOTE — Progress Notes
Date of Service: 11/18/2018    Joel Hernandez is a 78 y.o. male.       HPI     Joel Hernandez was in the [place] clinic today for follow-up regarding his prosthetic aortic valve and permanent atrial fibrillation.  He is back to tromping around a little bit in the woods and hunting.  He has to be careful not to fall down because his legs are still recovering but he is definitely on the mend.    He is going to cardiac rehab 3 days/week.  He does not remember exactly what his blood pressures run but we decided to cut back on his losartan dosage today because he would like to try using some Viagra again.    He denies any problems with chest discomfort or breathlessness.  He has had no palpitations.  He denies any significant bleeding complications related to oral anticoagulation.  He has had no TIA or stroke symptoms.         Vitals:    11/18/18 1322 11/18/18 1331   BP: 102/62 106/68   BP Source: Arm, Left Upper Arm, Right Upper   Pulse: 77    Temp: 36.7 ?C (98.1 ?F)    SpO2: 98%    Weight: 88.9 kg (196 lb)    Height: 1.829 m (6')    PainSc: Zero      Body mass index is 26.58 kg/m?Marland Kitchen     Past Medical History  Patient Active Problem List    Diagnosis Date Noted   ? 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 Unplanned return to operating room with repeat sternotomy for cardiac tamponade 07/29/2018     07/24/18 - Redo sternotomy with evacuation of hematoma     ? 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     ? S/p bare metal coronary artery stent 07/06/2018     x2 05/2018     ? Acute diastolic CHF (congestive heart failure) (HCC) 06/10/2018   ? RBBB 06/10/2018   ? Hypokalemia 06/10/2018   ? Hypomagnesemia 06/10/2018 ? Coronary artery disease due to lipid rich plaque 05/25/2018   ? 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     04/2018-found incidentally on CTA but confirmed by groin duplex     ? Foot drop 12/04/2016   ? HLD (hyperlipidemia) 12/04/2016   ? 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 Coronary artery disease due to calcified coronary lesion 01/19/2016     12/2015 - NSTEMI, LAD stent     ? Permanent atrial fibrillation (HCC) 01/18/2016     01/2016 - AF noted during hospitalization for unstable angina.  Attempt at DC cardioversion unsuccessful.  DC'ed on amiodarone & Eliquis.    01/2016 - Successful outpatient cardioversion at [facility].  Amiodarone continued    02/2016 - Office follow-up--back in AF, asymptomatic with good rate control.     ? Hypertension 09/15/2013     BP medication started in mid-30's     ? Obstructive sleep apnea 09/15/2013     Circa 2004 - Diagnosis established, CPAP started (Dr. Mayford Knife, pulmonologist in [facility])  Review of Systems   Constitution: Negative.   HENT: Negative.    Eyes: Negative.    Cardiovascular: Negative.    Respiratory: Negative.    Endocrine: Negative.    Hematologic/Lymphatic: Negative.    Skin: Negative.    Musculoskeletal: Negative.    Gastrointestinal: Negative.    Genitourinary: Negative.    Neurological: Negative.    Psychiatric/Behavioral: Negative.    Allergic/Immunologic: Negative.        Physical Exam    Physical Exam   General Appearance: no distress   Skin: warm, no ulcers or xanthomas   Digits and Nails: no cyanosis or clubbing   Eyes: conjunctivae and lids normal, pupils are equal and round   Teeth/Gums/Palate: dentition unremarkable, no lesions   Lips & Oral Mucosa: no pallor or cyanosis   Neck Veins: normal JVP , neck veins are not distended   Thyroid: no nodules, masses, tenderness or enlargement   Chest Inspection: chest is normal in appearance Respiratory Effort: breathing comfortably, no respiratory distress   Auscultation/Percussion: lungs clear to auscultation, no rales or rhonchi, no wheezing   PMI: PMI not enlarged or displaced   Cardiac Rhythm: regular rhythm and normal rate   Cardiac Auscultation: S1, S2 normal, no rub, no gallop   Murmurs: grade 1 systolic murmur   Peripheral Circulation: normal peripheral circulation   Carotid Arteries: normal carotid upstroke bilaterally, no bruits   Radial Arteries: normal symmetric radial pulses   Abdominal Aorta: no abdominal aortic bruit   Pedal Pulses: normal symmetric pedal pulses   Lower Extremity Edema: no lower extremity edema   Abdominal Exam: soft, non-tender, no masses, bowel sounds normal   Liver & Spleen: no organomegaly   Gait & Station: walks without assistance   Muscle Strength: normal muscle tone   Orientation: oriented to time, place and person   Affect & Mood: appropriate and sustained affect   Language and Memory: patient responsive and seems to comprehend information   Neurologic Exam: neurological assessment grossly intact   Other: moves all extremities      Problems Addressed Today  Encounter Diagnoses   Name Primary?   ? Essential hypertension    ? Permanent atrial fibrillation (HCC)    ? Presence of permanent cardiac pacemaker    ? S/P AVR (aortic valve replacement) and aortoplasty        Assessment and Plan       Hypertension  I am reducing losartan from 100 to 50 mg/day.  He is planning to use some Viagra and I am afraid that his blood pressure is a little bit too marginal to keep him from having some lightheadedness.    Permanent atrial fibrillation (HCC)  He has had no bleeding complications related to oral anticoagulation and his rate appears to be well controlled.    Presence of permanent cardiac pacemaker  His most recent remote transmission was earlier this month and the device appears to be functioning normally.    S/P AVR (aortic valve replacement) and aortoplasty He had an echocardiogram last month showing normal bioprosthetic valve function.  Left ventricular function is normal.      Current Medications (including today's revisions)  ? apixaban (ELIQUIS) 5 mg tablet Take one tablet by mouth twice daily.   ? aspirin 81 mg chewable tablet Chew one tablet by mouth daily. Take with food.   ? atorvastatin (LIPITOR) 40 mg tablet Take one tablet by mouth daily.   ? finasteride (PROSCAR) 5 mg tablet Take one tablet by mouth  daily.   ? losartan (COZAAR) 50 mg tablet Take one tablet by mouth daily.   ? methIMAzole (TAPAZOLE) 10 mg tablet Take  by mouth twice daily. Take 1 tablet in the morning and 1.5 tablets in the evening   ? metoprolol tartrate (LOPRESSOR) 50 mg tablet Take one tablet by mouth twice daily.   ? nitroglycerin (NITROSTAT) 0.4 mg tablet as Needed.   ? sildenafiL (VIAGRA) 100 mg tablet Take one tablet by mouth as Needed for Erectile dysfunction.   ? tamsulosin (FLOMAX) 0.4 mg capsule Take 1 capsule by mouth at bedtime daily.

## 2018-11-18 NOTE — Assessment & Plan Note
His most recent remote transmission was earlier this month and the device appears to be functioning normally.

## 2018-11-18 NOTE — Patient Instructions
1.  Reduce losartan to 50 mg/day--new prescription sent for 50 mg tablets  2.  OK to use Viagra

## 2018-11-18 NOTE — Assessment & Plan Note
I am reducing losartan from 100 to 50 mg/day.  He is planning to use some Viagra and I am afraid that his blood pressure is a little bit too marginal to keep him from having some lightheadedness.

## 2018-11-18 NOTE — Assessment & Plan Note
He has had no bleeding complications related to oral anticoagulation and his rate appears to be well controlled.

## 2019-01-24 ENCOUNTER — Encounter: Admit: 2019-01-24 | Discharge: 2019-01-24 | Payer: MEDICARE

## 2019-01-24 MED ORDER — ATORVASTATIN 40 MG PO TAB
ORAL_TABLET | Freq: Every day | 3 refills | Status: AC
Start: 2019-01-24 — End: ?

## 2019-01-25 ENCOUNTER — Encounter: Admit: 2019-01-25 | Discharge: 2019-01-25 | Payer: MEDICARE

## 2019-02-19 ENCOUNTER — Encounter: Admit: 2019-02-19 | Discharge: 2019-02-19 | Payer: MEDICARE

## 2019-02-19 DIAGNOSIS — I4821 Permanent atrial fibrillation: Secondary | ICD-10-CM

## 2019-02-19 DIAGNOSIS — Z950 Presence of cardiac pacemaker: Secondary | ICD-10-CM

## 2019-02-21 ENCOUNTER — Encounter: Admit: 2019-02-21 | Discharge: 2019-02-21 | Payer: MEDICARE

## 2019-04-25 ENCOUNTER — Encounter: Admit: 2019-04-25 | Discharge: 2019-04-25 | Payer: MEDICARE

## 2019-04-25 IMAGING — CR CHEST
2 series · 2 of 2 positions shown · non-contrast
Comparison: none

[chest pa x-wise]
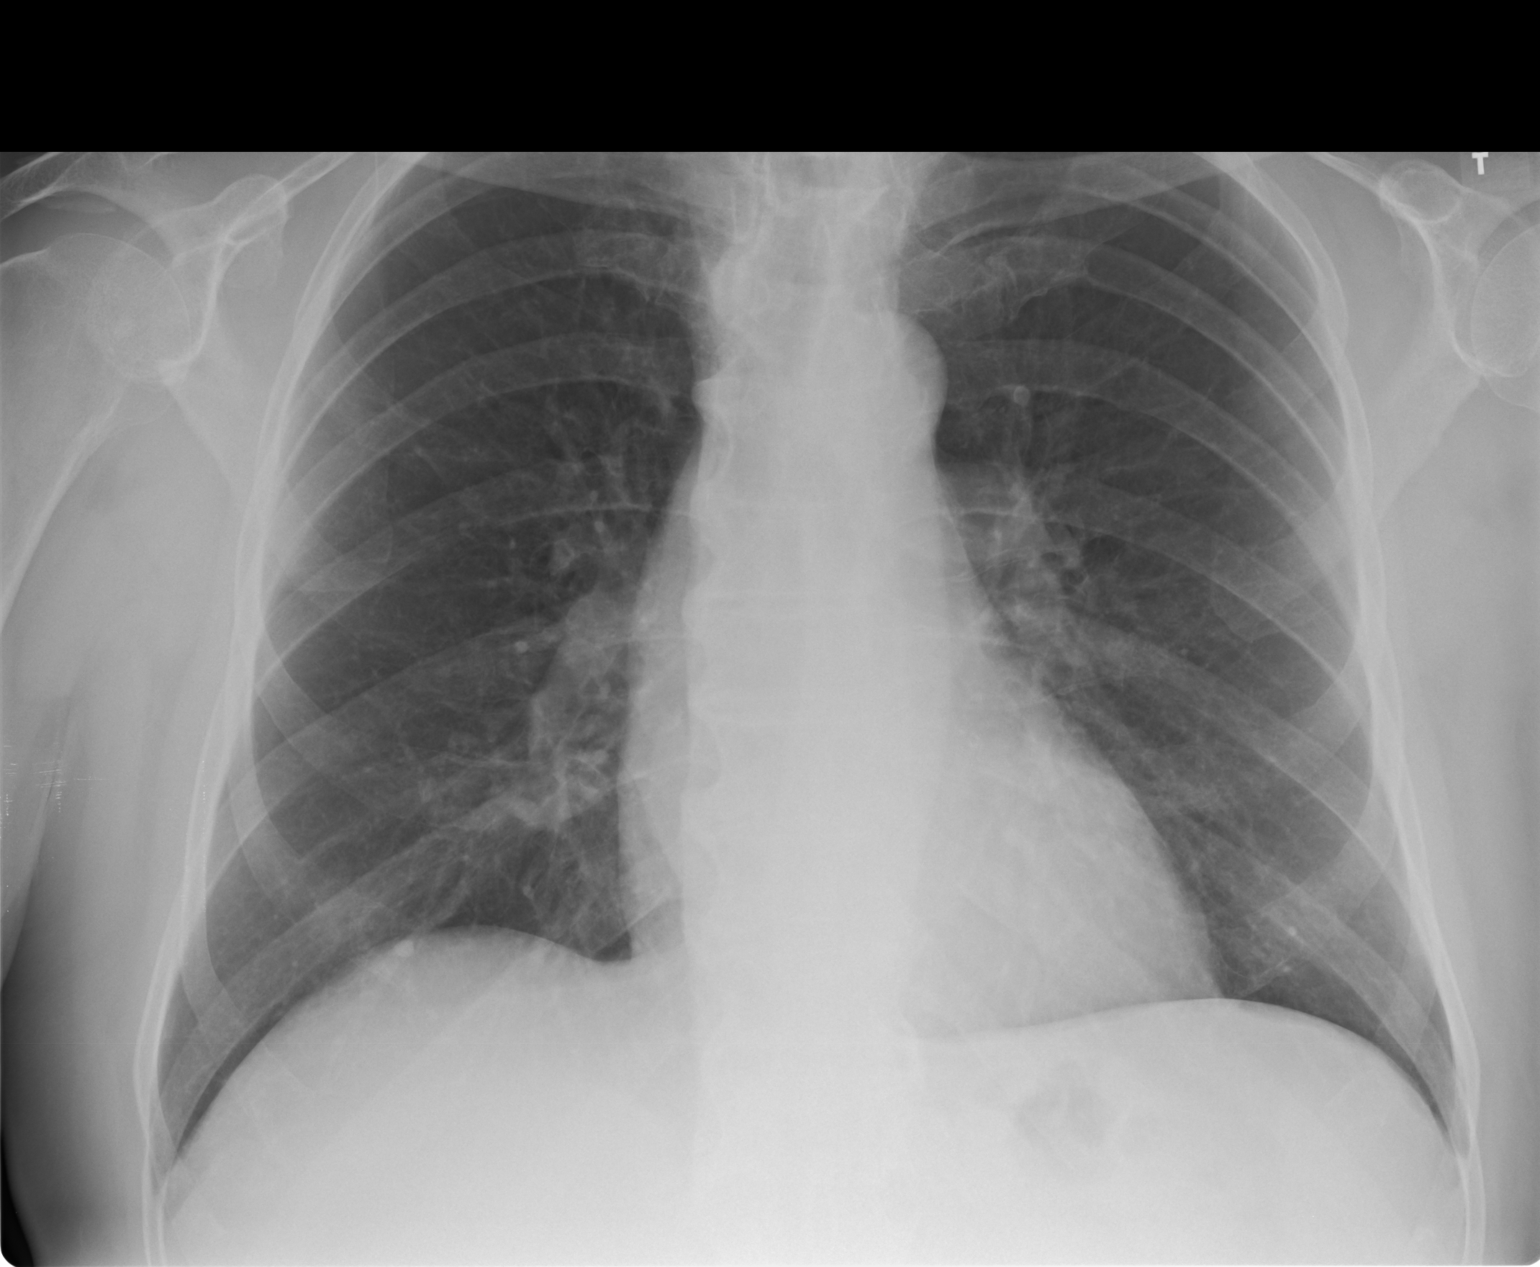

[chest lat]
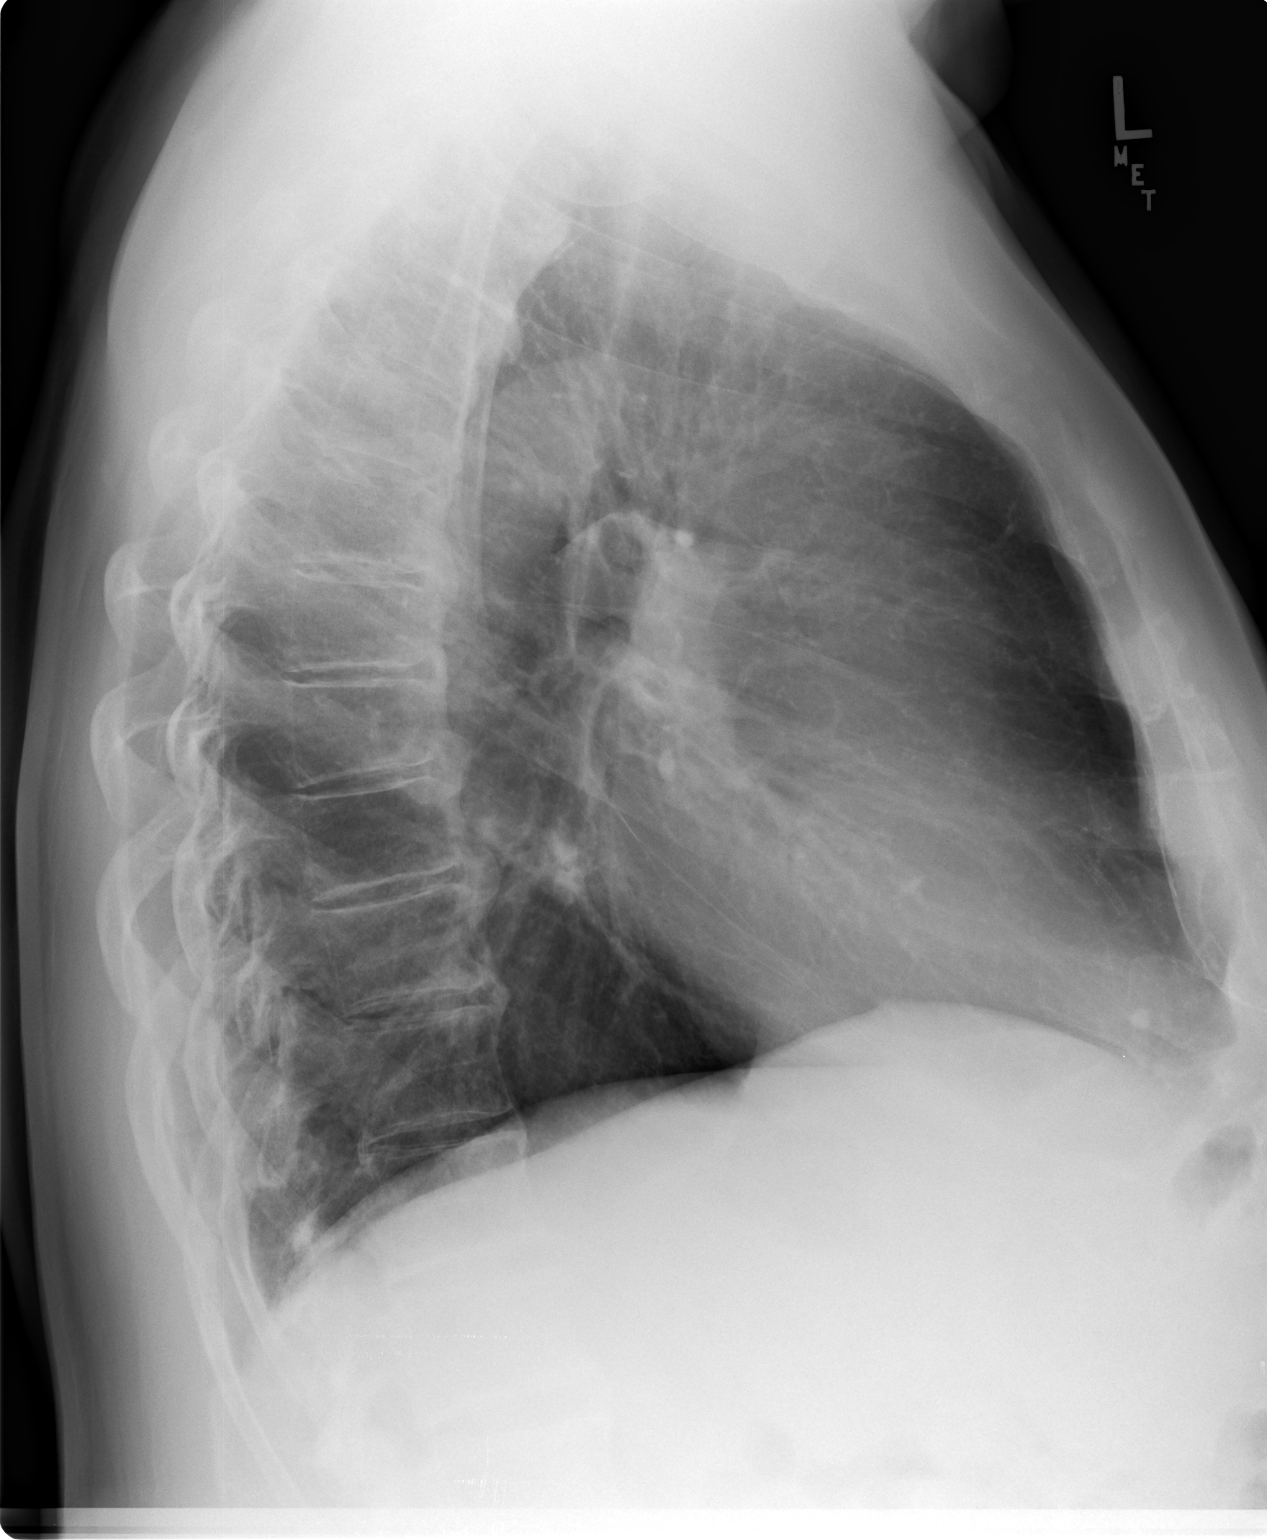

[2 of 2 positions shown; findings below may reference images not displayed]

DIAGNOSTIC STUDIES

EXAM

RADIOLOGICAL EXAMINATION, CHEST; 2 VIEWS FRONTAL AND LATERAL CPT 03252

INDICATION

76-year-old male with atrial fibrillation and hypertension

TECHNIQUE

2 views of the chest were acquired.

COMPARISONS

Chest radiograph January 18, 2016.

FINDINGS

The cardiac silhouette is within normal limits for size. The mediastinum is not widened or
deviated. The lungs are clear and the costophrenic sulci are sharp. Pulmonary vasculature is normal
caliber. No pneumothorax is identified. Diffuse idiopathic skeletal hyperostosis.

IMPRESSION

No acute cardiopulmonary process.

## 2019-04-25 NOTE — Progress Notes
Amiodarone Monitoring status as of 04/25/19:     An outside provider is monitoring this patient's amiodarone. amiodarone discontinued    Most recent lab results  Lab Results   Component Value Date/Time    AST 42 (H) 11/08/2018    ALT 50 11/08/2018    TSH 1.30 11/08/2018    FREET4R 1.12 12/03/2017         Procedures  Last chest X-Ray: 02/16/2018  Last PFT: 06/11/2017  Last eye exam:

## 2019-04-27 ENCOUNTER — Encounter: Admit: 2019-04-27 | Discharge: 2019-04-27 | Payer: MEDICARE

## 2019-05-11 ENCOUNTER — Encounter: Admit: 2019-05-11 | Discharge: 2019-05-11 | Payer: MEDICARE

## 2019-06-03 ENCOUNTER — Encounter: Admit: 2019-06-03 | Discharge: 2019-06-03 | Payer: MEDICARE

## 2019-06-03 MED ORDER — LOSARTAN 50 MG PO TAB
100 mg | ORAL_TABLET | Freq: Every day | ORAL | 3 refills | 30.00000 days | Status: AC
Start: 2019-06-03 — End: ?

## 2019-06-21 ENCOUNTER — Encounter: Admit: 2019-06-21 | Discharge: 2019-06-21 | Payer: MEDICARE

## 2019-06-21 ENCOUNTER — Ambulatory Visit: Admit: 2019-06-21 | Discharge: 2019-06-21 | Payer: MEDICARE

## 2019-06-21 DIAGNOSIS — Z950 Presence of cardiac pacemaker: Secondary | ICD-10-CM

## 2019-06-21 DIAGNOSIS — I4821 Permanent atrial fibrillation: Secondary | ICD-10-CM

## 2019-07-27 ENCOUNTER — Encounter: Admit: 2019-07-27 | Discharge: 2019-07-27 | Payer: MEDICARE

## 2019-07-27 NOTE — Progress Notes
Thyroid US dated 10/28/2017 and Thyroid biopsy results dated 01/06/2018 received from outside provider. Results reviewed and scanned into chart. Records were obtained from outside provider based on recommendations made on CTA Chest dated 06/29/2018.

## 2019-08-09 ENCOUNTER — Encounter: Admit: 2019-08-09 | Discharge: 2019-08-09 | Payer: MEDICARE

## 2019-08-18 ENCOUNTER — Encounter: Admit: 2019-08-18 | Discharge: 2019-08-18 | Payer: MEDICARE

## 2019-08-18 DIAGNOSIS — N401 Enlarged prostate with lower urinary tract symptoms: Secondary | ICD-10-CM

## 2019-08-23 MED ORDER — FINASTERIDE 5 MG PO TAB
ORAL_TABLET | Freq: Every day | 0 refills | Status: AC
Start: 2019-08-23 — End: ?

## 2019-09-11 ENCOUNTER — Encounter: Admit: 2019-09-11 | Discharge: 2019-09-11 | Payer: MEDICARE

## 2019-09-11 MED ORDER — ELIQUIS 5 MG PO TAB
ORAL_TABLET | Freq: Two times a day (BID) | 11 refills
Start: 2019-09-11 — End: ?

## 2019-09-14 ENCOUNTER — Encounter: Admit: 2019-09-14 | Discharge: 2019-09-14 | Payer: MEDICARE

## 2019-09-14 NOTE — Telephone Encounter
Spoke with Angelique Blonder via telephone and discussed options regarding patient assistance with Eliquis.

## 2019-10-04 ENCOUNTER — Encounter: Admit: 2019-10-04 | Discharge: 2019-10-04 | Payer: MEDICARE

## 2019-10-04 MED ORDER — METOPROLOL TARTRATE 50 MG PO TAB
ORAL_TABLET | Freq: Two times a day (BID) | ORAL | 3 refills | 90.00000 days | Status: AC
Start: 2019-10-04 — End: ?

## 2019-10-18 ENCOUNTER — Encounter: Admit: 2019-10-18 | Discharge: 2019-10-18 | Payer: MEDICARE

## 2019-10-18 MED ORDER — ELIQUIS 5 MG PO TAB
ORAL_TABLET | Freq: Two times a day (BID) | 11 refills
Start: 2019-10-18 — End: ?

## 2019-11-08 ENCOUNTER — Encounter: Admit: 2019-11-08 | Discharge: 2019-11-08 | Payer: MEDICARE

## 2020-01-17 ENCOUNTER — Encounter: Admit: 2020-01-17 | Discharge: 2020-01-17 | Payer: MEDICARE

## 2020-01-17 NOTE — Progress Notes
Records Request    Medical records request for continuation of care:    Patient has appointment on 01/19/2020   with  Dr. Tyson Alias* .    Please fax records to [place] [facility] [place]    Request records: STAT-APPT [place] (CMP, Lipid Panel)                    Thank you,      Cardiovascular Medicine  [facility]  [address] [place] [address]  Phone:  [phone]  Fax:  [phone]

## 2020-01-19 ENCOUNTER — Encounter: Admit: 2020-01-19 | Discharge: 2020-01-19 | Payer: MEDICARE

## 2020-01-19 DIAGNOSIS — I2510 Atherosclerotic heart disease of native coronary artery without angina pectoris: Secondary | ICD-10-CM

## 2020-01-19 DIAGNOSIS — R5383 Other fatigue: Secondary | ICD-10-CM

## 2020-01-19 DIAGNOSIS — R011 Cardiac murmur, unspecified: Secondary | ICD-10-CM

## 2020-01-19 DIAGNOSIS — I10 Essential (primary) hypertension: Secondary | ICD-10-CM

## 2020-01-19 DIAGNOSIS — I214 Non-ST elevation (NSTEMI) myocardial infarction: Secondary | ICD-10-CM

## 2020-01-19 DIAGNOSIS — Z952 Presence of prosthetic heart valve: Secondary | ICD-10-CM

## 2020-01-19 DIAGNOSIS — E7800 Pure hypercholesterolemia, unspecified: Secondary | ICD-10-CM

## 2020-01-19 DIAGNOSIS — I4821 Permanent atrial fibrillation: Secondary | ICD-10-CM

## 2020-01-19 DIAGNOSIS — M549 Dorsalgia, unspecified: Secondary | ICD-10-CM

## 2020-01-19 DIAGNOSIS — E079 Disorder of thyroid, unspecified: Secondary | ICD-10-CM

## 2020-01-19 DIAGNOSIS — I350 Nonrheumatic aortic (valve) stenosis: Secondary | ICD-10-CM

## 2020-01-19 DIAGNOSIS — C4490 Unspecified malignant neoplasm of skin, unspecified: Secondary | ICD-10-CM

## 2020-01-19 DIAGNOSIS — C44621 Squamous cell carcinoma of skin of unspecified upper limb, including shoulder: Secondary | ICD-10-CM

## 2020-01-19 DIAGNOSIS — G4730 Sleep apnea, unspecified: Secondary | ICD-10-CM

## 2020-01-19 DIAGNOSIS — I724 Aneurysm of artery of lower extremity: Secondary | ICD-10-CM

## 2020-01-19 DIAGNOSIS — I4891 Unspecified atrial fibrillation: Secondary | ICD-10-CM

## 2020-01-19 DIAGNOSIS — E785 Hyperlipidemia, unspecified: Secondary | ICD-10-CM

## 2020-01-19 DIAGNOSIS — G4733 Obstructive sleep apnea (adult) (pediatric): Secondary | ICD-10-CM

## 2020-01-19 LAB — CBC
Lab: 11 — ABNORMAL LOW (ref 14.0–18.0)
Lab: 13
Lab: 147
Lab: 3.4 — ABNORMAL LOW (ref 4.70–6.10)
Lab: 32
Lab: 32 — ABNORMAL HIGH (ref 27.0–31.0)
Lab: 33 — ABNORMAL LOW (ref 42.0–52.0)
Lab: 97

## 2020-01-19 LAB — COMPREHENSIVE METABOLIC PANEL
Lab: 139
Lab: 143
Lab: 18 — ABNORMAL LOW (ref >59)
Lab: 28
Lab: 3.7
Lab: 5
Lab: 56 — ABNORMAL HIGH (ref 8.4–25.7)
Lab: 6.7

## 2020-01-19 NOTE — Assessment & Plan Note
He's maintained on Eliquis for his AF.  No bleeding complications.

## 2020-01-19 NOTE — Assessment & Plan Note
I am somewhat concerned that his exercise intolerance and fatigue may be a sign of coronary disease progression.  He has not reliably had angina symptoms in the past and the rather nonspecific complaints may actually be an indication of cardiac ischemia for him.

## 2020-01-19 NOTE — Assessment & Plan Note
BP looks fine on the current medical program.

## 2020-01-19 NOTE — Telephone Encounter
Reviewed patient's labs drawn today with Dr. Ricard Dillon in clinic. Dr. Ricard Dillon recommends patient see PCP as soon as feasible due to elevated BUN and Creatinine.     Called and spoke with patient regarding patient's labs results and Dr. Ricard Dillon' recommendations. Patient is agreeable to care plan and has no questions at this time.     Lab results faxed to Dr. Tobey Grim office for patient to be scheduled for follow up.

## 2020-01-19 NOTE — Assessment & Plan Note
I ordered an echocardiogram to evaluate his bioprosthetic aortic valve.

## 2020-01-19 NOTE — Assessment & Plan Note
Lab Results   Component Value Date    CHOL 116 (L) 02/16/2018    TRIG 67 02/16/2018    HDL 39 02/16/2018    LDL 63 02/16/2018    VLDL 13 02/16/2018    NONHDLCHOL 122 01/19/2016    CHOLHDLC 3 02/16/2018      LDL looks fine, but hasn't been checked for awhile.

## 2020-01-31 ENCOUNTER — Encounter: Admit: 2020-01-31 | Discharge: 2020-01-31 | Payer: MEDICARE

## 2020-01-31 DIAGNOSIS — N179 Acute kidney failure, unspecified: Secondary | ICD-10-CM

## 2020-01-31 NOTE — Telephone Encounter
Dr. Jaclyn Shaggy called me today on my mobile phone regarding his concern for Mr. Novosad.  He had just seen the lab work from 12/23 and is quite concerned.  He is asking that I help facilitate an appointment in the Nephrology Clinic at [facility].    I did suggest that we should start with a kidney/bladder ultrasound to rule out evidence of obstructive uropathy, given the rapid course of his renal dysfunction.    I don't believe that subsequent lab has been checked since 12/23; I was away from a computer and under the impression that Dr. Jaclyn Shaggy was referring to more recent lab when we talked.    I placed an ambulatory Nephrology consult on an urgent basis and called the scheduling office for Nephrology.  The scheduler took a look at the chart and indicated that she understood the reason for the urgency and said that she'd forward this request to one of "her docs" and that this physician may or may not be giving me a call back.  She indicated that regardless of the call-back they would be working to get the patient in as soon as possible.    Mikey Kirschner Ricard Dillon, MD

## 2020-02-06 ENCOUNTER — Encounter: Admit: 2020-02-06 | Discharge: 2020-02-06 | Payer: MEDICARE

## 2020-02-09 ENCOUNTER — Encounter: Admit: 2020-02-09 | Discharge: 2020-02-09 | Payer: MEDICARE

## 2020-02-09 ENCOUNTER — Inpatient Hospital Stay: Admit: 2020-02-09 | Payer: MEDICARE

## 2020-02-09 ENCOUNTER — Inpatient Hospital Stay: Admit: 2020-02-09 | Discharge: 2020-02-09 | Payer: MEDICARE

## 2020-02-09 DIAGNOSIS — N179 Acute kidney failure, unspecified: Secondary | ICD-10-CM

## 2020-02-09 LAB — HEPATITIS C ANTIBODY W REFLEX HCV PCR QUANT

## 2020-02-09 LAB — HEPATITIS B SURFACE AG

## 2020-02-09 LAB — URINALYSIS DIPSTICK REFLEX TO CULTURE
Lab: NEGATIVE MMOL/L (ref 21–30)
Lab: NEGATIVE U/L — ABNORMAL HIGH (ref 7–40)
Lab: NEGATIVE U/L — ABNORMAL HIGH (ref 7–56)
Lab: NEGATIVE mg/dL (ref 6.0–8.0)
Lab: NEGATIVE mg/dL (ref 8.5–10.6)

## 2020-02-09 LAB — CBC
Lab: 10 FL (ref 7–11)
Lab: 148 K/UL — ABNORMAL LOW (ref 150–400)
Lab: 3.5 M/UL — ABNORMAL LOW (ref 4.4–5.5)
Lab: 7.8 K/UL (ref 4.5–11.0)
Lab: 95 FL — ABNORMAL HIGH (ref 80–100)

## 2020-02-09 LAB — HEPATITIS B SURFACE AB: Lab: NEGATIVE

## 2020-02-09 LAB — URINALYSIS MICROSCOPIC REFLEX TO CULTURE

## 2020-02-09 LAB — HIV 1& 2 AG-AB SCRN W REFLEX HIV 1 PCR QUANT

## 2020-02-09 LAB — C3 COMPLEMENT 3: Lab: 115 mg/dL (ref 88–200)

## 2020-02-09 LAB — SODIUM-URINE RANDOM: Lab: 80 MMOL/L — ABNORMAL LOW (ref 3.5–5.1)

## 2020-02-09 LAB — COMPREHENSIVE METABOLIC PANEL
Lab: 108 MMOL/L — ABNORMAL LOW (ref 98–110)
Lab: 125 U/L — ABNORMAL HIGH (ref 25–110)
Lab: 17 mL/min — ABNORMAL LOW (ref >60)
Lab: 22 MMOL/L (ref 21–30)
Lab: 22 U/L (ref 7–40)
Lab: 23 U/L (ref 7–56)
Lab: 3.4 mg/dL — ABNORMAL HIGH (ref 0.4–1.24)
Lab: 4.1 g/dL (ref 3.5–5.0)
Lab: 65 mg/dL — ABNORMAL HIGH (ref 7–25)
Lab: 8.9 mg/dL (ref 8.5–10.6)
Lab: 9 (ref 3–12)

## 2020-02-09 LAB — MYOGLOBIN-URINE RANDOM: Lab: 1 ng/mL (ref <25)

## 2020-02-09 LAB — C REACTIVE PROTEIN (CRP): Lab: 0 mg/dL (ref <1.0)

## 2020-02-09 LAB — PROTEIN/CR RATIO,UR RAN: Lab: 0.3 g/dL — AB (ref 3.5–5.0)

## 2020-02-09 LAB — RHEUMATOID FACTOR (RF): Lab: <10 [IU]/mL (ref <25)

## 2020-02-09 LAB — TSH WITH FREE T4 REFLEX: Lab: 2.3 uU/mL (ref 0.35–5.00)

## 2020-02-09 LAB — HEPATITIS B CORE AB TOT (IGG+IGM)

## 2020-02-09 LAB — SED RATE: Lab: 9 mm/h (ref 0–20)

## 2020-02-09 LAB — COVID-19 (SARS-COV-2) PCR

## 2020-02-09 LAB — C4 COMPLEMENT 4: Lab: 29 mg/dL (ref 10–49)

## 2020-02-09 LAB — CREATINE KINASE-CPK: Lab: 54 U/L (ref 35–232)

## 2020-02-09 MED ORDER — POLYETHYLENE GLYCOL 3350 17 GRAM PO PWPK
1 | Freq: Every day | ORAL | 0 refills | Status: AC | PRN
Start: 2020-02-09 — End: ?

## 2020-02-09 MED ORDER — FINASTERIDE 5 MG PO TAB
5 mg | Freq: Every day | ORAL | 0 refills | Status: AC
Start: 2020-02-09 — End: ?
  Administered 2020-02-10 – 2020-02-11 (×2): 5 mg via ORAL

## 2020-02-09 MED ORDER — AMLODIPINE 5 MG PO TAB
5 mg | Freq: Every day | ORAL | 0 refills | Status: AC
Start: 2020-02-09 — End: ?
  Administered 2020-02-10 – 2020-02-11 (×2): 5 mg via ORAL

## 2020-02-09 MED ORDER — METOPROLOL TARTRATE 50 MG PO TAB
50 mg | Freq: Two times a day (BID) | ORAL | 0 refills | Status: AC
Start: 2020-02-09 — End: ?
  Administered 2020-02-10 – 2020-02-11 (×4): 50 mg via ORAL

## 2020-02-09 MED ORDER — ELECTROLYTE-A IV SOLP
500 mL | INTRAVENOUS | 0 refills | Status: AC
Start: 2020-02-09 — End: ?
  Administered 2020-02-10 (×4): 500 mL via INTRAVENOUS

## 2020-02-09 MED ORDER — ATORVASTATIN 40 MG PO TAB
40 mg | Freq: Every day | ORAL | 0 refills | Status: AC
Start: 2020-02-09 — End: ?
  Administered 2020-02-10 – 2020-02-11 (×2): 40 mg via ORAL

## 2020-02-09 MED ORDER — ACETAMINOPHEN 325 MG PO TAB
650 mg | ORAL | 0 refills | Status: AC | PRN
Start: 2020-02-09 — End: ?

## 2020-02-09 MED ORDER — ONDANSETRON HCL (PF) 4 MG/2 ML IJ SOLN
4 mg | INTRAVENOUS | 0 refills | Status: AC | PRN
Start: 2020-02-09 — End: ?

## 2020-02-09 MED ORDER — TAMSULOSIN 0.4 MG PO CAP
.4 mg | Freq: Every evening | ORAL | 0 refills | Status: AC
Start: 2020-02-09 — End: ?
  Administered 2020-02-10 – 2020-02-11 (×2): 0.4 mg via ORAL

## 2020-02-09 MED ORDER — METHIMAZOLE 5 MG PO TAB
5 mg | Freq: Every day | ORAL | 0 refills | Status: AC
Start: 2020-02-09 — End: ?
  Administered 2020-02-10 – 2020-02-11 (×2): 5 mg via ORAL

## 2020-02-09 MED ORDER — ONDANSETRON 4 MG PO TBDI
4 mg | ORAL | 0 refills | Status: AC | PRN
Start: 2020-02-09 — End: ?

## 2020-02-09 MED ORDER — BISACODYL 10 MG RE SUPP
10 mg | Freq: Every day | RECTAL | 0 refills | Status: AC | PRN
Start: 2020-02-09 — End: ?

## 2020-02-09 MED ORDER — MELATONIN 3 MG PO TAB
3 mg | Freq: Every evening | ORAL | 0 refills | Status: AC | PRN
Start: 2020-02-09 — End: ?

## 2020-02-09 NOTE — H&P (View-Only)
Name:  Joel Hernandez                                             MRN:  [id number]   Admission Date:  02/09/2020                     Assessment/Plan:        AKI  - Patient is known to have normal renal function, on 01/19/2020 his Cr was 3.51, on 02/06/2020 4.27  - denies regular use of NSAIDS  - renal US was done showing some increased cortical echogenicity, but the kidneys were normal sized and there was no hydronephrosis.  - Urine sediment exam unremarkable; no significant proteinuria as per nephrology  - SPEP and IFEX 02/06/2020 normal  - urine lytes pending  - ANA pending  - renal consulted for possible biopsy    Permanent atrial fibrillation   - Successful outpatient cardioversion at [facility].    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 male with PMH of as below is being admitted from home for worsening of renal functio. Patient is known to have normal renal function, on 01/19/2020 his Cr was 3.51, on 02/06/2020 4.27. That time renal US was done showing some increased cortical echogenicity, but the kidneys were normal sized and there was no hydronephrosis. His PCP stopped his losartan. He denies using NSAIDS. Patient denies any nausea, vomiting, diarrhea.     Medical History:   Diagnosis Date   ? Aortic stenosis    ? Atrial fibrillation (HCC)    ? Back pain    ? CAD (coronary artery disease)    ? Disorder of thyroid gland    ? Heart murmur 09/15/2013   ? Hyperlipidemia    ? Hypertension 09/15/2013   ? NSTEMI (non-ST elevated myocardial infarction) (HCC) 12/2015    s/p stent placement   ? OSA on CPAP     No CPAP use after 55lb weight loss   ? 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 SCC (squamous cell carcinoma), arm    ? Severe aortic stenosis    ? Skin cancer    ? Sleep apnea      Surgical History:   Procedure Laterality  Date   ? LUMBAR LAMINECTOMY  2004    L4-5   ? LUMBAR LAMINECTOMY  2011    Hemilaminectomies L 2-3 L3-4   ? RIGHT LUMBAR 4-5 REDO LAMINECTOMY AND LATERAL RECESS DECOMPRESSION Right 12/04/2016    Performed by Theotis Barrio, MD at [facility] OR   ? LAMINECTOMY/ FACETECTOMY/ FORAMINOTOMY WITH DECOMPRESSION - 1 VERTEBRAL SEGMENT - LUMBAR  12/04/2016    Performed by Theotis Barrio, MD at [facility] OR   ? CORONARY STENT PLACEMENT  12/2016   ? KNEE REPLACEMENT Right 08/2017   ? ANGIOGRAPHY CORONARY ARTERY WITH LEFT HEART CATHETERIZATION N/A 05/24/2018    Performed by Marcell Barlow, MD at [facility] CATH LAB   ? POSSIBLE PERCUTANEOUS CORONARY STENT PLACEMENT WITH ANGIOPLASTY N/A 05/24/2018    Performed by Marcell Barlow, MD at [facility] CATH LAB   ? CLOSURE PSEUDOANEURYSM BY INJECTION- LOWER EXTREMITY N/A 05/26/2018    Performed by Marcell Barlow, MD at [facility] CATH LAB   ? CORONARY STENT PLACEMENT  05/2018    x2   ? Aortic Root replacement (Freestyle)/Implantation of coronary artery buttons, Circ arrest N/A 07/21/2018    Performed by Earlene Plater (Trip) L III, MD at [facility] CVOR   ? INSERTION/ REPLACEMENT PERMANENT PACEMAKER WITH ATRIAL AND VENTRICULAR LEAD Left 07/23/2018    Performed by Lorain Childes, MD at [facility] EP LAB   ? FLUOROSCOPY - CARDIAC  07/23/2018    Performed by Lorain Childes, MD at [facility] EP LAB   ? EXPLORATION POSTOPERATIVE WOUND - CHEST N/A 07/24/2018    Performed by Earlene Plater (Trip) Elbert Ewings III, MD at [facility] CVOR   ? COLONOSCOPY     ? HEART CATHETERIZATION  01/2016, 05/2018    Stent x 1 LAD placement following NSTEMI, 2 stents placed 04/2018   ? HX HEART CATHETERIZATION     ? SKIN CANCER EXCISION  2017, 2018    SCC Arm      Family History   Problem Relation Age of Onset   ? Heart Disease Mother      Social History     Socioeconomic History   ? Marital status: Married     Spouse name: Not on file   ? Number of children: Not on file   ? Years of education: Not on file   ? Highest education level: Not on file   Occupational History   ? Not on file   Tobacco Use   ? Smoking status: Former Smoker     Packs/day: 0.50     Years: 15.00     Pack years: 7.50     Types: Cigarettes     Quit date: 11/20/1969     Years since quitting: 50.2   ? Smokeless tobacco: Never Used   Vaping Use   ? Vaping Use: Never used   Substance and Sexual Activity   ? Alcohol use: No   ? Drug use: Never   ? Sexual activity: Not on file   Other Topics Concern   ? Not on file   Social History Narrative   ? Not on file      Immunizations (includes history and patient reported):   Immunization History   Administered Date(s) Administered   ? COVID-19 (MODERNA), mRNA vacc, 100 mcg/0.5 mL (PF) 12/01/2019           Allergies:  Lisinopril    Medications:  Medications Prior to Admission   Medication Sig   ? amLODIPine (NORVASC) 5 mg tablet Take 5 mg by  mouth daily.   ? atorvastatin (LIPITOR) 40 mg tablet TAKE 1 TABLET BY MOUTH EVERY DAY   ? ELIQUIS 5 mg tablet TAKE 1 TABLET BY MOUTH TWICE A DAY   ? finasteride (PROSCAR) 5 mg tablet TAKE 1 TABLET BY MOUTH EVERY DAY   ? losartan (COZAAR) 50 mg tablet Take two tablets by mouth daily.   ? methIMAzole (TAPAZOLE) 10 mg tablet Take 5 mg by mouth daily.   ? metoprolol tartrate (LOPRESSOR) 50 mg tablet TAKE 1 TABLET BY MOUTH TWICE A DAY   ? nitroglycerin (NITROSTAT) 0.4 mg tablet as Needed.   ? sildenafiL (VIAGRA) 100 mg tablet Take one tablet by mouth as Needed for Erectile dysfunction.   ? tamsulosin (FLOMAX) 0.4 mg capsule Take 1 capsule by mouth at bedtime daily.     Review of Systems:  A comprehensive  12 point review of organ systems reviewed and was negative except for the ones mentioned in HOPI    Physical Exam:  Vital Signs: Last Filed In 24 Hours Vital Signs: 24 Hour Range                General:  Alert, awake, oriented x 3 , cooperative, no distress, appears stated age  Head:  Normocephalic, without obvious abnormality, atraumatic  Eyes:  Conjunctivae/corneas clear   Nose: Nares normal. Mucosa normal.  No drainage or sinus tenderness  Throat: Lips, mucosa and tongue normal  Neck:    Supple, symmetrical, trachea midline, no adenopathy, thyroid: no enlargement/tenderness/nodules  Lungs:  Clear to auscultation bilaterally  Heart:   Regular rate and rhythm, S1, S2 normal, no murmur  Abdomen:  Soft, non-tender.  Bowel sounds normal.  No masses.  No organomegaly.  Extremities: Extremities normal, atraumatic, no cyanosis or edema  Skin: Skin color, texture, turgor normal.    Lymph nodes:  Cervical, supraclavicular and axillary nodes normal  Neurologic: Non focal grossly    Lab/Radiology/Other Diagnostic Tests:  24-hour labs:  No results found for this visit on 02/09/20 (from the past 24 hour(s)).     Pertinent radiology reviewed.    No results found.

## 2020-02-10 ENCOUNTER — Encounter: Admit: 2020-02-10 | Discharge: 2020-02-10 | Payer: MEDICARE

## 2020-02-10 ENCOUNTER — Inpatient Hospital Stay: Admit: 2020-02-10 | Discharge: 2020-02-10 | Payer: MEDICARE

## 2020-02-10 DIAGNOSIS — R011 Cardiac murmur, unspecified: Secondary | ICD-10-CM

## 2020-02-10 DIAGNOSIS — C4490 Unspecified malignant neoplasm of skin, unspecified: Secondary | ICD-10-CM

## 2020-02-10 DIAGNOSIS — G4730 Sleep apnea, unspecified: Secondary | ICD-10-CM

## 2020-02-10 DIAGNOSIS — E785 Hyperlipidemia, unspecified: Secondary | ICD-10-CM

## 2020-02-10 DIAGNOSIS — I2510 Atherosclerotic heart disease of native coronary artery without angina pectoris: Secondary | ICD-10-CM

## 2020-02-10 DIAGNOSIS — G4733 Obstructive sleep apnea (adult) (pediatric): Secondary | ICD-10-CM

## 2020-02-10 DIAGNOSIS — I214 Non-ST elevation (NSTEMI) myocardial infarction: Secondary | ICD-10-CM

## 2020-02-10 DIAGNOSIS — I724 Aneurysm of artery of lower extremity: Secondary | ICD-10-CM

## 2020-02-10 DIAGNOSIS — E079 Disorder of thyroid, unspecified: Secondary | ICD-10-CM

## 2020-02-10 DIAGNOSIS — I350 Nonrheumatic aortic (valve) stenosis: Secondary | ICD-10-CM

## 2020-02-10 DIAGNOSIS — M549 Dorsalgia, unspecified: Secondary | ICD-10-CM

## 2020-02-10 DIAGNOSIS — I4891 Unspecified atrial fibrillation: Secondary | ICD-10-CM

## 2020-02-10 DIAGNOSIS — I10 Essential (primary) hypertension: Secondary | ICD-10-CM

## 2020-02-10 DIAGNOSIS — C44621 Squamous cell carcinoma of skin of unspecified upper limb, including shoulder: Secondary | ICD-10-CM

## 2020-02-10 MED ADMIN — PERFLUTREN LIPID MICROSPHERES 1.1 MG/ML IV SUSP [79178]: 1.5 mL | INTRAVENOUS | @ 17:00:00 | Stop: 2020-02-10 | NDC 11994001116

## 2020-02-11 ENCOUNTER — Encounter: Admit: 2020-02-11 | Discharge: 2020-02-11 | Payer: MEDICARE

## 2020-02-15 ENCOUNTER — Ambulatory Visit: Admit: 2020-02-15 | Discharge: 2020-02-15 | Payer: MEDICARE

## 2020-02-15 ENCOUNTER — Encounter: Admit: 2020-02-15 | Discharge: 2020-02-15 | Payer: MEDICARE

## 2020-02-15 DIAGNOSIS — I2510 Atherosclerotic heart disease of native coronary artery without angina pectoris: Secondary | ICD-10-CM

## 2020-02-15 MED ORDER — REGADENOSON 0.4 MG/5 ML IV SYRG
.4 mg | Freq: Once | INTRAVENOUS | 0 refills | Status: CP
Start: 2020-02-15 — End: ?
  Administered 2020-02-15: 15:00:00 0.4 mg via INTRAVENOUS

## 2020-02-15 MED ORDER — ALBUTEROL SULFATE 90 MCG/ACTUATION IN HFAA
2 | RESPIRATORY_TRACT | 0 refills | Status: AC | PRN
Start: 2020-02-15 — End: ?

## 2020-02-15 MED ORDER — EUCALYPTUS-MENTHOL MM LOZG
1 | Freq: Once | ORAL | 0 refills | Status: AC | PRN
Start: 2020-02-15 — End: ?

## 2020-02-15 MED ORDER — RP DX TL-201 THALLOUS CHL MCI
2 | Freq: Once | INTRAVENOUS | 0 refills | Status: CP
Start: 2020-02-15 — End: ?
  Administered 2020-02-15: 15:00:00 2.03 via INTRAVENOUS

## 2020-02-15 MED ORDER — SODIUM CHLORIDE 0.9 % IV SOLP
250 mL | INTRAVENOUS | 0 refills | Status: AC | PRN
Start: 2020-02-15 — End: ?

## 2020-02-15 MED ORDER — RP DX TL-201 THALLOUS CHL MCI
.5 | Freq: Once | INTRAVENOUS | 0 refills | Status: CP
Start: 2020-02-15 — End: ?
  Administered 2020-02-15: 18:00:00 0.51 via INTRAVENOUS

## 2020-02-15 MED ORDER — NITROGLYCERIN 0.4 MG SL SUBL
.4 mg | SUBLINGUAL | 0 refills | Status: AC | PRN
Start: 2020-02-15 — End: ?

## 2020-02-15 MED ORDER — AMINOPHYLLINE 500 MG/20 ML IV SOLN
50 mg | INTRAVENOUS | 0 refills | Status: AC | PRN
Start: 2020-02-15 — End: ?

## 2020-02-17 ENCOUNTER — Encounter: Admit: 2020-02-17 | Discharge: 2020-02-17 | Payer: MEDICARE

## 2020-02-17 DIAGNOSIS — I2510 Atherosclerotic heart disease of native coronary artery without angina pectoris: Secondary | ICD-10-CM

## 2020-02-17 MED ORDER — ATORVASTATIN 40 MG PO TAB
40 mg | ORAL_TABLET | Freq: Every day | ORAL | 3 refills | Status: AC
Start: 2020-02-17 — End: ?

## 2020-02-17 NOTE — Telephone Encounter
-----   Message from Michiel Cowboy, MD sent at 02/17/2020 11:04 AM CST -----  [facility], can you please let Marden Noble know that this looks OK for him.  There is a small defect, but overall this is a low-risk study.  I think that his fatigue symptoms are all related to his renal dysfunction, not CAD progression.    Cc:  Dr. Jaclyn Shaggy

## 2020-02-17 NOTE — Telephone Encounter
Results and recommendations called to patient.

## 2020-02-17 NOTE — Telephone Encounter
-----   Message from Michiel Cowboy, MD sent at 02/17/2020 11:04 AM CST -----  [facility], can you please let Joel Hernandez know that this looks OK for him.  There is a small defect, but overall this is a low-risk study.  I think that his fatigue symptoms are all related to his renal dysfunction, not CAD progression.    Cc:  Dr. Jaclyn Shaggy

## 2020-02-20 ENCOUNTER — Encounter: Admit: 2020-02-20 | Discharge: 2020-02-20 | Payer: MEDICARE

## 2020-02-20 DIAGNOSIS — N179 Acute kidney failure, unspecified: Secondary | ICD-10-CM

## 2020-02-21 ENCOUNTER — Encounter: Admit: 2020-02-21 | Discharge: 2020-02-21 | Payer: MEDICARE

## 2020-02-21 DIAGNOSIS — I340 Nonrheumatic mitral (valve) insufficiency: Secondary | ICD-10-CM

## 2020-02-21 DIAGNOSIS — I2510 Atherosclerotic heart disease of native coronary artery without angina pectoris: Secondary | ICD-10-CM

## 2020-02-21 LAB — LIPID PROFILE
Lab: 108 mL/min (ref >60)
Lab: 12
Lab: 3
Lab: 39 — ABNORMAL LOW (ref >=40)
Lab: 57
Lab: 62

## 2020-02-21 NOTE — Progress Notes
Cardiac Navigation Intake Assessment Document    Patient Name:  Joel Hernandez  MRN:  [id number]  DOB:  [birth year]  Insurance:   Payor: [employer] MEDICARE / Plan: [employer]HC MEDICARE REPLACEMENT / Product Type: Medicare /     Appointment Info:   Future Appointments   Date Time Provider Department Center   02/28/2020 11:00 AM [place] [facility] Procedur   02/28/2020  1:30 PM Julienne Kass, MD [facility] Exam   02/28/2020  1:30 PM Arby Barrette, MD [id number]       Diagnosis & Reason for Visit:  MR/Mitraclip eval    Physician Info:   ? Referring Physician(Cardiologist):  Inpatient/ Lucianne Muss on consult service  ? Cardiologist:  Danella Maiers MD  ? PCP:  Steva Ready MD  ? Nephrologist:   Hulda Marin MD    Location of Films:  IN HOUSE    History of Present Illness:      05/24/18 Cath 1. Successful drug-eluting stent percutaneous coronary intervention of mid left circumflex into obtuse marginal 2 with 2.25 x 28 mm Xience Sierra drug-eluting stent.  2. Successful drug-eluting stent percutaneous coronary intervention of mid right posterior descending artery with 2.75 x 15 mm Xience Sierra drug-eluting stent.     02/10/20 Echo Normal left ventricular contractility.  Biplane LVEF 62%.  Severe concentric left ventricular hypertrophy.  Moderate right ventricular dilatation with probable normal right ventricular contractility.    Severe left atrial and mild right atrial dilatation.   Device lead seen in the right atrium and right ventricle.  Markedly elevated central venous pressure.  Nonspecific mitral valve leaflet thickening and calcified mitral chordae and mitral annulus.  Monophasic mitral diastolic flow.  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  Normal tricuspid valve motion.  Moderate tricuspid regurgitation.  Estimated PA pressure 46 mmHg.   25 mm Medtronic Freestyle bioprosthetic aortic valve appears well-seated. ?Bioprosthetic atrial valve cusp motion is not well visualized.  Peak aortic flow velocity 1.4 m/s, mean aortic gradient 5 mmHg, peak gradient 7 mmHg, aortic velocity ratio 0.79, and calculated effective orifice area 2.24 cm?Marland Kitchen ?No aortic regurgitation.  Pulmonic valve in the pulmonary artery not well visualized.  Trace pulmonic regurgitation.  Normal aortic root and proximal ascending aortic dimensions.  The transverse aorta is not visualized.  No pericardial effusion.     02/15/20  Referral received from Dr Raynelle Bring for valve clinic evaluation regarding MR/Mitraclip       Medical history(Pertinent to valve workup) :  Afib, BPH, CAD s/p PCI, CKD, OSA    COVID Vaccine (Moderna): 1st dose: (12/01/19)-- Per [place] [facility]    Surgery/Procedure history (Pertinent to valve workup) : Echo (results above)    Reported heart failure symptoms: Fatigue     Dental health:    Goes to the dentist routinely: No   Last dental check up: May 2020 prior to last valve surgery   Educated to the need for dental clearance prior to scheduling potential valve surgery.    NEEDS Assessment:                             ? Social Work/Financial:  No need identified                      ? Physical:  Independent                                  ?  Communication:  No needs identified      Reviewed records with Norman Clay NP                    Plan: TEE, Office visit    Comments:  02/21/2020 11:27 AM Spoke to Mr. Renier.  Updated on current plan. Determined the best date for an appointment. Educated to all appointment information.

## 2020-02-24 ENCOUNTER — Encounter: Admit: 2020-02-24 | Discharge: 2020-02-24 | Payer: MEDICARE

## 2020-02-24 NOTE — Telephone Encounter
Spoke with pt directly and instructed:  Where to go and time to arrive, to be NPO after midnight Tuesday, to bring a driver, to not wear lotions/oils to chest area and why.  Pt plans to be to [facility] by 36 with son to escort in four-wheeled drive truck.  Reminded has an appointment after TEE and was aware.  Return [phone] provided in case has any further questions.

## 2020-02-27 ENCOUNTER — Encounter: Admit: 2020-02-27 | Discharge: 2020-02-27 | Payer: MEDICARE

## 2020-02-28 ENCOUNTER — Encounter: Admit: 2020-02-28 | Discharge: 2020-02-28 | Payer: MEDICARE

## 2020-02-28 ENCOUNTER — Ambulatory Visit: Admit: 2020-02-28 | Discharge: 2020-02-28 | Payer: MEDICARE

## 2020-02-28 DIAGNOSIS — I10 Essential (primary) hypertension: Secondary | ICD-10-CM

## 2020-02-28 DIAGNOSIS — I4891 Unspecified atrial fibrillation: Secondary | ICD-10-CM

## 2020-02-28 DIAGNOSIS — R011 Cardiac murmur, unspecified: Secondary | ICD-10-CM

## 2020-02-28 DIAGNOSIS — I952 Hypotension due to drugs: Secondary | ICD-10-CM

## 2020-02-28 DIAGNOSIS — E079 Disorder of thyroid, unspecified: Secondary | ICD-10-CM

## 2020-02-28 DIAGNOSIS — I350 Nonrheumatic aortic (valve) stenosis: Secondary | ICD-10-CM

## 2020-02-28 DIAGNOSIS — I4821 Permanent atrial fibrillation: Secondary | ICD-10-CM

## 2020-02-28 DIAGNOSIS — I2510 Atherosclerotic heart disease of native coronary artery without angina pectoris: Secondary | ICD-10-CM

## 2020-02-28 DIAGNOSIS — I724 Aneurysm of artery of lower extremity: Secondary | ICD-10-CM

## 2020-02-28 DIAGNOSIS — C44621 Squamous cell carcinoma of skin of unspecified upper limb, including shoulder: Secondary | ICD-10-CM

## 2020-02-28 DIAGNOSIS — E785 Hyperlipidemia, unspecified: Secondary | ICD-10-CM

## 2020-02-28 DIAGNOSIS — G4733 Obstructive sleep apnea (adult) (pediatric): Secondary | ICD-10-CM

## 2020-02-28 DIAGNOSIS — G4730 Sleep apnea, unspecified: Secondary | ICD-10-CM

## 2020-02-28 DIAGNOSIS — E7800 Pure hypercholesterolemia, unspecified: Secondary | ICD-10-CM

## 2020-02-28 DIAGNOSIS — Z950 Presence of cardiac pacemaker: Secondary | ICD-10-CM

## 2020-02-28 DIAGNOSIS — I5032 Chronic diastolic (congestive) heart failure: Secondary | ICD-10-CM

## 2020-02-28 DIAGNOSIS — I340 Nonrheumatic mitral (valve) insufficiency: Secondary | ICD-10-CM

## 2020-02-28 DIAGNOSIS — Z952 Presence of prosthetic heart valve: Secondary | ICD-10-CM

## 2020-02-28 DIAGNOSIS — C4490 Unspecified malignant neoplasm of skin, unspecified: Secondary | ICD-10-CM

## 2020-02-28 DIAGNOSIS — M549 Dorsalgia, unspecified: Secondary | ICD-10-CM

## 2020-02-28 DIAGNOSIS — I214 Non-ST elevation (NSTEMI) myocardial infarction: Secondary | ICD-10-CM

## 2020-02-28 DIAGNOSIS — N170 Acute kidney failure with tubular necrosis: Secondary | ICD-10-CM

## 2020-02-28 DIAGNOSIS — N179 Acute kidney failure, unspecified: Secondary | ICD-10-CM

## 2020-02-28 DIAGNOSIS — I5031 Acute diastolic (congestive) heart failure: Secondary | ICD-10-CM

## 2020-02-28 LAB — COMPREHENSIVE METABOLIC PANEL
Lab: 140 MMOL/L (ref 137–147)
Lab: 3.4 mg/dL — ABNORMAL HIGH (ref 0.4–1.24)
Lab: 5.1 MMOL/L (ref 3.5–5.1)
Lab: 58 mg/dL — ABNORMAL HIGH (ref 7–25)
Lab: 86 mg/dL (ref 70–100)
Lab: 9.2 mg/dL (ref 8.5–10.6)

## 2020-02-28 LAB — URIC ACID: Lab: 7 mg/dL — ABNORMAL HIGH (ref 4.0–8.0)

## 2020-02-28 MED ORDER — PHENYLEPHRINE HCL IN 0.9% NACL 1 MG/10 ML (100 MCG/ML) IV SYRG
INTRAVENOUS | 0 refills | Status: DC
Start: 2020-02-28 — End: 2020-02-28
  Administered 2020-02-28: 18:00:00 100 ug via INTRAVENOUS

## 2020-02-28 MED ORDER — PROPOFOL INJ 10 MG/ML IV VIAL
INTRAVENOUS | 0 refills | Status: DC
Start: 2020-02-28 — End: 2020-02-28
  Administered 2020-02-28: 17:00:00 30 mg via INTRAVENOUS
  Administered 2020-02-28: 17:00:00 40 mg via INTRAVENOUS

## 2020-02-28 MED ORDER — LIDOCAINE (PF) 20 MG/ML (2 %) IJ SOLN
INTRAVENOUS | 0 refills | Status: DC
Start: 2020-02-28 — End: 2020-02-28
  Administered 2020-02-28: 17:00:00 70 mg via INTRAVENOUS

## 2020-02-28 MED ORDER — SODIUM CHLORIDE 0.9 % IV SOLP (OR) 500ML
INTRAVENOUS | 0 refills | Status: DC
Start: 2020-02-28 — End: 2020-02-28
  Administered 2020-02-28: 17:00:00 via INTRAVENOUS

## 2020-02-28 MED ORDER — PROPOFOL 10 MG/ML IV EMUL 20 ML (INFUSION)(AM)(OR)
INTRAVENOUS | 0 refills | Status: DC
Start: 2020-02-28 — End: 2020-02-28
  Administered 2020-02-28: 17:00:00 100 ug/kg/min via INTRAVENOUS

## 2020-02-28 NOTE — Progress Notes
Care Plan   Care Category & Patient Outcome Goal Met Treatment/  Interventions Plan of the Day RN Name   Cardiovascular  Hemodynamic stability and adequate peripheral perfusion.   Yes   Refer to  patient's chart. Monitor VS per sedation standard. Monitor ECG continuously.  Assess/maintain IV patency.   Glade Lloyd, RN   Respiratory  Patent airway, ease of respiration, and adequate oxygenation.   Yes   Refer to  patient's chart. Maintain open airway. Assess respirations. Monitor O2 saturations. Titrate O2 to keep sat>= 95% or baseline.   Glade Lloyd, RN   Psychological/Emotional/Spiritual  Cope with procedure with support in place.  Spiritual needs are addressed.     Yes   Refer to  patient's chart. Provide adequate and thorough instructions.  Provide a caring and supportive environment.  Communicate patients concerns with other members of the health team.   Glade Lloyd, RN   Pain  Patients pain goal met.   Yes   Refer to  patient's chart. Prepare patient for potentially uncomfortable procedure.  Observe for verbal/nonverbal complaints of pain.  Assess pain.  Provide comfort measures.   Glade Lloyd, RN   Safety/Fall Risk  Free from injury, security maintained.   Yes High Risk    Refer to  patient's chart.   Follow nursing standard of practice for high risks fall patients.   Glade Lloyd, RN   Knowledge Base  Verbalize understanding of procedure/information provided.   Yes   Refer to  patient's chart. Describe the procedure along with what symptoms to expect.  Evaluate patients understanding of procedure.  Encourage patient to ask questions.  Provide additional information as needed.   Glade Lloyd, RN

## 2020-02-28 NOTE — Progress Notes
Date of Service: 02/28/2020       Subjective:             Joel Hernandez is a 80 y.o. male.      History of Present Illness  We had the pleasure of evaluating [facility] for further management of mitral regurgitation.  He is a very pleasant 80 year old male with hypertension, permanent atrial fibrillation on chronic anticoagulation, BPH, coronary artery disease status post PCI, CKD stage III, obstructive sleep apnea, pacemaker in place, hyperthyroidism, and aortic stenosis requiring coherent valve replacement with a 25 mm Medtronic freestyle tissue valve and aortoplasty in June 2020.  He follows with Dr. Andria Meuse cardiology group and was recently admitted 1/13?02/11/20 for AKI.  Creatinine on admission was 4.27 and last known creatinine was 3.5.  This was felt to be secondary to ATN and recent low blood pressure.  His creatinine improved with Plasma-Lyte fusion and losartan was discontinued.  As part of his work-up he underwent echo Doppler on 02/09/2218 which revealed:     Normal left ventricular contractility.  Biplane LVEF 62%.  Severe concentric left ventricular hypertrophy.  Moderate right ventricular dilatation with probable normal right ventricular contractility.    Severe left atrial and mild right atrial dilatation.   Device lead seen in the right atrium and right ventricle.  Markedly elevated central venous pressure.  Nonspecific mitral valve leaflet thickening and calcified mitral chordae and mitral annulus.  Monophasic mitral diastolic flow.  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  Normal tricuspid valve motion.  Moderate tricuspid regurgitation.  Estimated PA pressure 46 mmHg.   25 mm Medtronic Freestyle bioprosthetic aortic valve appears well-seated. ?Bioprosthetic atrial valve cusp motion is not well visualized.  Peak aortic flow velocity 1.4 m/s, mean aortic gradient 5 mmHg, peak gradient 7 mmHg, aortic velocity ratio 0.79, and calculated effective orifice area 2.24 cm?Marland Kitchen ?No aortic regurgitation.  Pulmonic valve in the pulmonary artery not well visualized.  Trace pulmonic regurgitation.  Normal aortic root and proximal ascending aortic dimensions.  The transverse aorta is not visualized.  No pericardial effusion.    His last cardiac catheterization was completed on 05/24/2018 18 underwent successful placement of a drug-eluting stent to the mid left circumflex into the obtuse marginal as well as a drug-eluting stent to the mid right posterior descending artery.  He has had recent complaints of change in activity tolerance concerning for angina and was referred for regadenoson MPI stress test on 02/15/2020 which revealed a small size, moderate intensity, reversible perfusion defect in the anterior apical segment suggestive of a small area of apical ischemia.      During his recent admission for AKI he was found to have severe mitral regurgitation and he was referred to the valve clinic for consideration of MitraClip.  Today he reports progressive shortness of breath over the last 6 months to 1 year.  He reports when ambulating to the point he can ambulate about 75 yards before he has to stop and rest.  He notes progressive shortness of breath with incline and longer distances.  He denies orthopnea, PND, or lower extremity edema.  He denies recent chest pain, palpitations, dizziness, lightheadedness, or near syncope.    TEE was completed today revealed:    ? There is severe mitral valve regurgitation (Carpentier type I) predominantly due to atrial dilatation. No stenosis (MG=65mmHg @HR  64 bpm, MVA 5.5 cm2)  ? Systolic flow reversal in the right pulmonary veins and systolic blunting in the left pulmonary veins.  ?  The left ventricular size is normal with preserved systolic function. LVEF ~65%. no demonstration of left ventricular thrombus. Abnormal septal motion consistent with right ventricular pacing.  ? The right ventricle is mildly dilated wtih low normal systolic function.  ? Severly dilated LA. Mildly di;ated RA.  ? Moderate tricuspid regurgitation.  ? Pacer wires in the righ side of the heart.  ? There is a well-seated 25 mm Medtronic Freestyle bioprosthetic aortic valve with no signs of malfuntion.  ? Estimated PASP + RAP      STS risk assessment:   MV repair  Risk of Mortality: 15.681%   Renal Failure: 77.194%   Permanent Stroke: 7.696%   Prolonged Ventilation: 38.963%   DSW Infection: 0.235%   Reoperation: 11.693%   Morbidity or Mortality: 69.030%   Short Length of Stay: 3.520%   Long Length of Stay: 43.399%    MVR  Risk of Mortality: 27.861%   Renal Failure: 52.102%   Permanent Stroke: 6.421%   Prolonged Ventilation: 48.599%   DSW Infection: 0.450%   Reoperation: 23.616%   Morbidity or Mortality: 74.799%   Short Length of Stay: 1.851%   Long Length of Stay: 50.830%       Review of Systems   Constitutional: Negative.   HENT: Negative.    Eyes: Negative.    Cardiovascular: Negative.    Respiratory: Negative.    Endocrine: Negative.    Hematologic/Lymphatic: Negative.    Skin: Negative.    Musculoskeletal: Negative.    Gastrointestinal: Negative.    Genitourinary: Negative.    Neurological: Negative.    Psychiatric/Behavioral: Negative.    Allergic/Immunologic: Negative.          Objective:         ? amLODIPine (NORVASC) 5 mg tablet Take 5 mg by mouth daily.   ? atorvastatin (LIPITOR) 40 mg tablet Take one tablet by mouth daily.   ? ELIQUIS 5 mg tablet TAKE 1 TABLET BY MOUTH TWICE A DAY   ? finasteride (PROSCAR) 5 mg tablet TAKE 1 TABLET BY MOUTH EVERY DAY   ? methIMAzole (TAPAZOLE) 10 mg tablet Take 5 mg by mouth daily.   ? metoprolol tartrate (LOPRESSOR) 50 mg tablet Take one tablet by mouth twice daily. Hold for HR < 55 and/or SBP<110   ? nitroglycerin (NITROSTAT) 0.4 mg tablet as Needed.   ? sildenafiL (VIAGRA) 100 mg tablet Take one tablet by mouth as Needed for Erectile dysfunction.   ? tamsulosin (FLOMAX) 0.4 mg capsule Take 1 capsule by mouth at bedtime daily.     Vitals: 02/28/20 1336   BP: (!) 140/82   Pulse: 46   SpO2: 99%   Weight: 98.9 kg (218 lb)   Height: 1.829 m (6')   PainSc: Zero     Body mass index is 29.57 kg/m?Marland Kitchen     Medical History:   Diagnosis Date   ? Aortic stenosis    ? Atrial fibrillation (HCC)    ? Back pain    ? CAD (coronary artery disease)    ? Disorder of thyroid gland    ? Heart murmur 09/15/2013   ? Hyperlipidemia    ? Hypertension 09/15/2013   ? NSTEMI (non-ST elevated myocardial infarction) (HCC) 12/2015    s/p stent placement   ? OSA on CPAP     No CPAP use after 55lb weight loss   ? 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  SCC (squamous cell carcinoma), arm    ? Severe aortic stenosis    ? Skin cancer    ? Sleep apnea      Surgical History:   Procedure Laterality Date   ? LUMBAR LAMINECTOMY  2004    L4-5   ? LUMBAR LAMINECTOMY  2011    Hemilaminectomies L 2-3 L3-4   ? RIGHT LUMBAR 4-5 REDO LAMINECTOMY AND LATERAL RECESS DECOMPRESSION Right 12/04/2016    Performed by Theotis Barrio, MD at [facility] OR   ? LAMINECTOMY/ FACETECTOMY/ FORAMINOTOMY WITH DECOMPRESSION - 1 VERTEBRAL SEGMENT - LUMBAR  12/04/2016    Performed by Theotis Barrio, MD at [facility] OR   ? CORONARY STENT PLACEMENT  12/2016   ? KNEE REPLACEMENT Right 08/2017   ? ANGIOGRAPHY CORONARY ARTERY WITH LEFT HEART CATHETERIZATION N/A 05/24/2018    Performed by Marcell Barlow, MD at [facility] CATH LAB   ? POSSIBLE PERCUTANEOUS CORONARY STENT PLACEMENT WITH ANGIOPLASTY N/A 05/24/2018    Performed by Marcell [facility]low, MD at [facility] CATH LAB   ? CLOSURE PSEUDOANEURYSM BY INJECTION- LOWER EXTREMITY N/A 05/26/2018    Performed by Marcell Barlow, MD at [facility] CATH LAB   ? CORONARY STENT PLACEMENT  05/2018    x2   ? Aortic Root replacement (Freestyle)/Implantation of coronary artery buttons, Circ arrest N/A 07/21/2018    Performed by Earlene Plater (Trip) L III, MD at [facility] CVOR   ? INSERTION/ REPLACEMENT PERMANENT PACEMAKER WITH ATRIAL AND VENTRICULAR LEAD Left 07/23/2018    Performed by Lorain Childes, MD at [facility] EP LAB   ? FLUOROSCOPY - CARDIAC  07/23/2018    Performed by Lorain Childes, MD at [facility] EP LAB   ? EXPLORATION POSTOPERATIVE WOUND - CHEST N/A 07/24/2018    Performed by Earlene Plater (Trip) Elbert Ewings III, MD at [facility] CVOR   ? COLONOSCOPY     ? HEART CATHETERIZATION  01/2016, 05/2018    Stent x 1 LAD placement following NSTEMI, 2 stents placed 04/2018   ? HX HEART CATHETERIZATION     ? SKIN CANCER EXCISION  2017, 2018    SCC Arm      Family History   Problem Relation Age of Onset   ? Heart Disease Mother      Social History     Socioeconomic History   ? Marital status: Married     Spouse name: Not on file   ? Number of children: Not on file   ? Years of education: Not on file   ? Highest education level: Not on file   Occupational History   ? Not on file   Tobacco Use   ? Smoking status: Former Smoker     Packs/day: 0.50     Years: 15.00     Pack years: 7.50     Types: Cigarettes     Quit date: 11/20/1969     Years since quitting: 50.3   ? Smokeless tobacco: Never Used   Vaping Use   ? Vaping Use: Never used   Substance and Sexual Activity   ? Alcohol use: No   ? Drug use: Never   ? Sexual activity: Not on file   Other Topics Concern   ? Not on file   Social History Narrative   ? Not on file     Vaping/E-liquid Use   ? Vaping Use Never User                 Physical Exam  General Appearance: no  acute distress  Skin: warm & intact  HEENT: unremarkable  Neck Veins: neck veins are flat & not distended  Carotid Arteries: no bruits  Chest Inspection: chest is normal in appearance  Auscultation/Percussion: lungs clear to auscultation, no rales, rhonchi, or wheezing  Cardiac Rhythm: regular rhythm & normal rate  Cardiac Auscultation: Normal S1 & S2, no S3 or S4, no rub  Murmurs: 3/6 holosystolic murmur   Extremities: no lower extremity edema; 2+ symmetric distal pulses  Abdominal Exam: soft, non-tender, no masses, bowel sounds normal  Liver & Spleen: no organomegaly  Neurologic Exam: oriented to time, place and person; no focal neurologic deficits  Psychiatric: Normal mood and affect.  Behavior is normal. Judgment and thought content normal.            Assessment and Plan:  Severe mitral regurgitation  His imaging was reviewed with Dr. Shirlee Latch and Dr. Helen Hashimoto and he has evidence of severe mitral regurgitation and I feel he would be a candidate for MitraClip therapy.  Recent MPI stress test was reviewed which is a small apical defect and given his lack of symptoms do not feel like cardiac cath is warranted in order to preserve his renal function.  Patient reports he had blood work drawn for his nephrologist today we will review those.    Chronic diastolic heart failure  He appears well compensated today on exam.  He describes NYHA class II heart failure symptoms.    Coronary artery disease  History of PCI to the circumflex into the OM 2 and to the PDA.  He had NSTEMI in December 2017 and had PCI to the LAD at that time.  Recent MPI stress test on 02/15/2020 revealed a small reversible anterior, apical defect consistent with a small area of apical myocardial ischemia.    Permanent atrial fibrillation  He is on chronic anticoagulation with Eliquis.    Permanent pacemaker in place    CKD stage III  He follows with nephrology at [place].    Hypertension  Blood pressure optimally controlled.    Hyperlipidemia    Obstructive sleep apnea    We appreciate the opportunity to participate in his care.    Dorena Cookey, APRN  Pager [phone]    I had the pleasure of seeing Mr. Mimbs in the valve clinic for evaluation of his mitral valve disease.  He is well-known to our service.  He is a very pleasant 80 year old gentleman who during evaluation for his severe aortic stenosis had PCI to his left main that was complicated by an annular injury with pseudoaneurysm.  TAVR was abandoned and he was subsequently brought to the operating room for aortic root replacement and replacement of his ascending aorta with circulatory arrest.    Overall he is done well since that time.  He has had subsequent PCI with Dr. Mackey Birchwood for progression of his coronary disease.  He had recent admission for acute renal insufficiency on top of his chronic renal failure.  Echo at that time showed severe mitral regurgitation.  He has noticed decreased exercise tolerance over the past couple months.    On TEE his mitral valve disease clearly has progressed.  He has a large valve area with no significant gradient.  He has 2 large jets 1 slightly medial and one slightly lateral but coalesce into a large regurgitant jet.  I do think he has anatomy amenable to MitraClip.    We reviewed the risks and benefits of the procedure.  Given his connective tissue  disorder we would favor multiple clips to reinforce the repair.  He understands there is a possibility we may not place clips if we cannot find appropriate position.  We also reviewed the risks of the procedure including bleeding, infection, stroke, dialysis and death.  He has expressed understanding and is willing to proceed.  We will keep you updated on his progress.    Sindy Messing, M.D.  Problem   Chronic Diastolic Heart Failure (Hcc)   Acute Diastolic Chf (Congestive Heart Failure) (Hcc) (Resolved)

## 2020-02-28 NOTE — Progress Notes
Pre-Operative Assessment for TEE or Cardioversion    Date of Service:  02/28/2020    Joel Hernandez is a 80 y.o. y.o. male. With significant HTN, HLD, PAF, pseudoaneurysm, AKI, CAD, AVR, aortic root replacement ..., who is referred for TEE Indication: MR/ mitraclip eval .      he has been compliant with his  apixaban (Eliquis) Missed dose: Yesand ... bleeding. he is Positive for: Hx. Chest surgery/radiation and Positive for: Pulm HTN and Sleep Apnea/OSA.      GI procedures:none            When: When/Date: n/a    Chest pain:  No   SOB: No         Medical History:  Medical History:   Diagnosis Date   ? Aortic stenosis    ? Atrial fibrillation (HCC)    ? Back pain    ? CAD (coronary artery disease)    ? Disorder of thyroid gland    ? Heart murmur 09/15/2013   ? Hyperlipidemia    ? Hypertension 09/15/2013   ? NSTEMI (non-ST elevated myocardial infarction) (HCC) 12/2015    s/p stent placement   ? OSA on CPAP     No CPAP use after 55lb weight loss   ? 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 SCC (squamous cell carcinoma), arm    ? Severe aortic stenosis    ? Skin cancer    ? Sleep apnea         Surgical History:   Surgical History:   Procedure Laterality Date   ? LUMBAR LAMINECTOMY  2004    L4-5   ? LUMBAR LAMINECTOMY  2011    Hemilaminectomies L 2-3 L3-4   ? RIGHT LUMBAR 4-5 REDO LAMINECTOMY AND LATERAL RECESS DECOMPRESSION Right 12/04/2016    Performed by Theotis Barrio, MD at [facility] OR   ? LAMINECTOMY/ FACETECTOMY/ FORAMINOTOMY WITH DECOMPRESSION - 1 VERTEBRAL SEGMENT - LUMBAR  12/04/2016    Performed by Theotis Barrio, MD at [facility] OR   ? CORONARY STENT PLACEMENT  12/2016   ? KNEE REPLACEMENT Right 08/2017   ? ANGIOGRAPHY CORONARY ARTERY WITH LEFT HEART CATHETERIZATION N/A 05/24/2018    Performed by Marcell Barlow, MD at [facility] CATH LAB   ? POSSIBLE PERCUTANEOUS CORONARY STENT PLACEMENT WITH ANGIOPLASTY N/A 05/24/2018    Performed by Marcell Barlow, MD at [facility] CATH LAB   ? CLOSURE PSEUDOANEURYSM BY INJECTION- LOWER EXTREMITY N/A 05/26/2018    Performed by Marcell Barlow, MD at [facility] CATH LAB   ? CORONARY STENT PLACEMENT  05/2018    x2   ? Aortic Root replacement (Freestyle)/Implantation of coronary artery buttons, Circ arrest N/A 07/21/2018    Performed by Earlene Plater (Trip) L III, MD at [facility] CVOR   ? INSERTION/ REPLACEMENT PERMANENT PACEMAKER WITH ATRIAL AND VENTRICULAR LEAD Left 07/23/2018    Performed by Lorain Childes, MD at [facility] EP LAB   ? FLUOROSCOPY - CARDIAC  07/23/2018    Performed by Lorain Childes, MD at [facility] EP LAB   ? EXPLORATION POSTOPERATIVE WOUND - CHEST N/A 07/24/2018    Performed by Earlene Plater (Trip) Elbert Ewings III, MD at [facility] CVOR   ? COLONOSCOPY     ? HEART CATHETERIZATION  01/2016, 05/2018    Stent x 1 LAD placement following NSTEMI, 2 stents placed 04/2018   ? HX HEART CATHETERIZATION     ?  SKIN CANCER EXCISION  2017, 2018    SCC Arm        Social History     Social History     Tobacco Use   ? Smoking status: Former Smoker     Packs/day: 0.50     Years: 15.00     Pack years: 7.50     Types: Cigarettes     Quit date: 11/20/1969     Years since quitting: 50.3   ? Smokeless tobacco: Never Used   Vaping Use   ? Vaping Use: Never used   Substance Use Topics   ? Alcohol use: No   ? Drug use: Never         Allergies                                        Allergies   Allergen Reactions   ? Lisinopril SEE COMMENTS     Reports respiratory distress and back pain          Current Medications  Current Outpatient Medications on File Prior to Encounter   Medication Sig Dispense Refill   ? amLODIPine (NORVASC) 5 mg tablet Take 5 mg by mouth daily.     ? atorvastatin (LIPITOR) 40 mg tablet Take one tablet by mouth daily. 90 tablet 3   ? ELIQUIS 5 mg tablet TAKE 1 TABLET BY MOUTH TWICE A DAY 60 tablet 11   ? finasteride (PROSCAR) 5 mg tablet TAKE 1 TABLET BY MOUTH EVERY DAY 90 tablet 0   ? methIMAzole (TAPAZOLE) 10 mg tablet Take 5 mg by mouth daily.     ? metoprolol tartrate (LOPRESSOR) 50 mg tablet Take one tablet by mouth twice daily. Hold for HR < 55 and/or SBP<110 180 tablet 3   ? nitroglycerin (NITROSTAT) 0.4 mg tablet as Needed.     ? sildenafiL (VIAGRA) 100 mg tablet Take one tablet by mouth as Needed for Erectile dysfunction. 10 tablet    ? tamsulosin (FLOMAX) 0.4 mg capsule Take 1 capsule by mouth at bedtime daily.       No current facility-administered medications on file prior to encounter.       Vitals  Estimated body mass index is 30.71 kg/m? as calculated from the following:    Height as of this encounter: 1.778 m (5' 10).    Weight as of this encounter: 97.1 kg (214 lb).       Patient appears alert and oriented: Yes  NPO: for greater than 8 hours  Inpatient IV status: rh 22    Diagnostic Tests  White Blood Cells   Date Value Ref Range Status   02/11/2020 7.3 4.5 - 11.0 K/UL Final     Hemoglobin   Date Value Ref Range Status   02/11/2020 10.3 (L) 13.5 - 16.5 GM/DL Final     Hematocrit   Date Value Ref Range Status   02/11/2020 30.5 (L) 40 - 50 % Final     Platelet Count   Date Value Ref Range Status   02/11/2020 120 (L) 150 - 400 K/UL Final     Sodium   Date Value Ref Range Status   02/11/2020 139 137 - 147 MMOL/L Final     Potassium   Date Value Ref Range Status   02/11/2020 4.8 3.5 - 5.1 MMOL/L Final     Magnesium   Date Value Ref Range Status  02/11/2020 2.0 1.6 - 2.6 mg/dL Final     Blood Urea Nitrogen   Date Value Ref Range Status   02/11/2020 62 (H) 7 - 25 MG/DL Final     Creatinine   Date Value Ref Range Status   02/11/2020 3.41 (H) 0.4 - 1.24 MG/DL Final     Glucose   Date Value Ref Range Status   02/11/2020 88 70 - 100 MG/DL Final       Last MAC INR Flow Sheet Entry:    Last recorded Lab results:   INR   Date Value Ref Range Status   07/25/2018 1.4 (H) 0.8 - 1.2 Final   07/24/2018 1.2 0.8 - 1.2 Final   07/21/2018 1.2 0.8 - 1.2 Final   07/06/2018 1.1 0.8 - 1.2 Final   06/07/2018 1.2 0.8 - 1.2 Final   01/18/2016 1.1 0.8 - 1.2 Final     APTT Date Value Ref Range Status   07/25/2018 30.8 24.0 - 36.5 SEC Final           Blood Cultures  Resulted Micro Last 72 Hrs    No results found         Last TEE date: 02/12/16  Last Cardioversion date: 02/12/16  Echo procedures within the past 30 days:  No results found.      Device Information on File  Lab Results   Component Value Date/Time    GENERATOR Medtronic 02/19/2020 09:42 AM    EPDEVTYP IPG 02/19/2020 09:42 AM         Additional Comments:  None    Plan:  Dr. Harvel Ricks will plan to proceed with the  TEE.

## 2020-02-28 NOTE — Patient Instructions
CARDIOLOGY PROCEDURES           POST SEDATION INSTRUCTIONS      Patient Name: Olajuwon Fosdick  MRN#: [id number]  Date: 02/28/2020      Please follow the instructions listed below:   . Please have someone accompany you, as YOU SHOULD NOT drive or operate machinery for at least 12-24 hours following the procedure.     There may be some residual effects from the sedatives during the procedure.   Do not drive a vehicle for up to 24 hours after receiving sedation.   Do not operate heavy or potentially harmful equipment   Do not make legally binding decisions   Do not drink alcohol for up to 24 hours   Do not communicate through social media for 24 hours.        . If you have question or concerns about this procedure, please contact the Cardiology office at [phone], and ask to speak to one of the nurses.      Current Medications List:   amLODIPine (NORVASC) 5 mg tablet Take 5 mg by mouth daily.    atorvastatin (LIPITOR) 40 mg tablet Take one tablet by mouth daily.    ELIQUIS 5 mg tablet TAKE 1 TABLET BY MOUTH TWICE A DAY    finasteride (PROSCAR) 5 mg tablet TAKE 1 TABLET BY MOUTH EVERY DAY    methIMAzole (TAPAZOLE) 10 mg tablet Take 5 mg by mouth daily.    metoprolol tartrate (LOPRESSOR) 50 mg tablet Take one tablet by mouth twice daily. Hold for HR < 55 and/or SBP<110    nitroglycerin (NITROSTAT) 0.4 mg tablet as Needed.    sildenafiL (VIAGRA) 100 mg tablet Take one tablet by mouth as Needed for Erectile dysfunction.    tamsulosin (FLOMAX) 0.4 mg capsule Take 1 capsule by mouth at bedtime daily.         Instructions Given JK:DTOIZTI and son  Instructions Given By: Glade Lloyd, RN

## 2020-03-01 ENCOUNTER — Encounter: Admit: 2020-03-01 | Discharge: 2020-03-01 | Payer: MEDICARE

## 2020-03-01 NOTE — Progress Notes
Letter of dental clearance faxed to Dr. Posey Pronto DDS at fax# [phone] with fax confirmation received

## 2020-03-02 ENCOUNTER — Encounter: Admit: 2020-03-02 | Discharge: 2020-03-02 | Payer: MEDICARE

## 2020-03-02 NOTE — Progress Notes
Letter of dental clearance received from Dr. Posey Pronto and uploaded to pt's chart by [facility] med records.

## 2020-03-05 ENCOUNTER — Encounter: Admit: 2020-03-05 | Discharge: 2020-03-05 | Payer: MEDICARE

## 2020-03-05 ENCOUNTER — Inpatient Hospital Stay: Admit: 2020-03-05 | Discharge: 2020-03-05 | Payer: MEDICARE

## 2020-03-05 DIAGNOSIS — I340 Nonrheumatic mitral (valve) insufficiency: Secondary | ICD-10-CM

## 2020-03-05 DIAGNOSIS — Z20822 Encounter for screening laboratory testing for COVID-19 virus in asymptomatic patient: Secondary | ICD-10-CM

## 2020-03-05 NOTE — Patient Instructions
Dear Joel Hernandez    You have been scheduled for Mitraclip surgery with Dr. Azzie Glatter and Dr. Dinah Beers on 03/21/20.    Per the recommendations of your doctors, you will need to be off your Eliquis for 3 days before your procedure. Your last dose will be 03/17/2020 in the evening (take). You should stop your Eliquis on 03/18/20 and after.    You may continue to take all of your other medications. You will be given instructions on any other medications to hold the day of surgery at your Pre-Op appointment.          Scheduled Pre-Op Appointment Date:03/20/2020  Pre-Op Appointment Time:7:30 AM  Appointment Location: [facility] [address] [place] [address]  [place], [place] [address]   COVID Testing Information: to be done at pre-op appointment    Pulmonary Function Testing: Scheduled to follow your pre-op appointment (appt at 9 am)    Please reach out to one of [place] for any further questions or concerns regarding your heart valve appointments or surgery.    Sincerely,   [place]e, [place], [facility]  Ph# [phone]  Fax# [phone]

## 2020-03-05 NOTE — Telephone Encounter
Pt given verbal instructions over the phone regarding pre-op time, PFT time and Eliquis holding instructions.    Letter with all info mailed to pt's home address on this date per his request, as he states he does not check his mychart patient portal.

## 2020-03-05 NOTE — Telephone Encounter
Dear Joel Hernandez    You have been scheduled for Mitraclip surgery with Dr. Azzie Glatter and Dr. Dinah Beers on 03/21/20.    Per the recommendations of your doctors, you will need to be off your Eliquis for 3 days before your procedure. Your last dose will be 03/17/2020 in the evening (take). You should stop your Eliquis on 03/18/20 and after.    You may continue to take all of your other medications. You will be given instructions on any other medications to hold the day of surgery at your Pre-Op appointment.          Scheduled Pre-Op Appointment Date:03/20/2020  Pre-Op Appointment Time:7:30 AM  Appointment Location: [facility] [place]  [place], [place] [address]   COVID Testing Information: to be done at pre-op appointment    Pulmonary Function Testing: Scheduled to follow your pre-op appointment (appt at 9 am)    Please reach out to one of [place] for any further questions or concerns regarding your heart valve appointments or surgery.    Sincerely,   Dor[place]e, [place], [facility]  Ph# [phone]  Fax# [phone]

## 2020-03-15 ENCOUNTER — Encounter

## 2020-03-15 DIAGNOSIS — I340 Nonrheumatic mitral (valve) insufficiency: Secondary | ICD-10-CM

## 2020-03-15 MED ORDER — SODIUM CHLORIDE 0.9 % IV SOLP
INTRAVENOUS | 0 refills
Start: 2020-03-15 — End: ?

## 2020-03-15 MED ORDER — ASPIRIN 81 MG PO CHEW
81 mg | Freq: Every day | ORAL | 0 refills
Start: 2020-03-15 — End: ?

## 2020-03-15 MED ORDER — LIDOCAINE (PF) 10 MG/ML (1 %) IJ SOLN
.2 mL | INTRAMUSCULAR | 0 refills | PRN
Start: 2020-03-15 — End: ?

## 2020-03-20 ENCOUNTER — Encounter: Admit: 2020-03-20 | Discharge: 2020-03-20 | Payer: MEDICARE

## 2020-03-20 ENCOUNTER — Ambulatory Visit: Admit: 2020-03-20 | Discharge: 2020-03-20 | Payer: MEDICARE

## 2020-03-20 DIAGNOSIS — Z20822 Encounter for screening laboratory testing for COVID-19 virus in asymptomatic patient: Secondary | ICD-10-CM

## 2020-03-20 DIAGNOSIS — I2510 Atherosclerotic heart disease of native coronary artery without angina pectoris: Secondary | ICD-10-CM

## 2020-03-20 DIAGNOSIS — I350 Nonrheumatic aortic (valve) stenosis: Secondary | ICD-10-CM

## 2020-03-20 DIAGNOSIS — I10 Essential (primary) hypertension: Secondary | ICD-10-CM

## 2020-03-20 DIAGNOSIS — M549 Dorsalgia, unspecified: Secondary | ICD-10-CM

## 2020-03-20 DIAGNOSIS — R011 Cardiac murmur, unspecified: Secondary | ICD-10-CM

## 2020-03-20 DIAGNOSIS — I340 Nonrheumatic mitral (valve) insufficiency: Secondary | ICD-10-CM

## 2020-03-20 DIAGNOSIS — C4490 Unspecified malignant neoplasm of skin, unspecified: Secondary | ICD-10-CM

## 2020-03-20 DIAGNOSIS — N179 Acute kidney failure, unspecified: Secondary | ICD-10-CM

## 2020-03-20 DIAGNOSIS — E785 Hyperlipidemia, unspecified: Secondary | ICD-10-CM

## 2020-03-20 DIAGNOSIS — I724 Aneurysm of artery of lower extremity: Secondary | ICD-10-CM

## 2020-03-20 DIAGNOSIS — I4891 Unspecified atrial fibrillation: Secondary | ICD-10-CM

## 2020-03-20 DIAGNOSIS — I214 Non-ST elevation (NSTEMI) myocardial infarction: Secondary | ICD-10-CM

## 2020-03-20 DIAGNOSIS — E079 Disorder of thyroid, unspecified: Secondary | ICD-10-CM

## 2020-03-20 DIAGNOSIS — C44621 Squamous cell carcinoma of skin of unspecified upper limb, including shoulder: Secondary | ICD-10-CM

## 2020-03-20 DIAGNOSIS — G4733 Obstructive sleep apnea (adult) (pediatric): Secondary | ICD-10-CM

## 2020-03-20 LAB — PTT (APTT): Lab: 30 s (ref 24.0–36.5)

## 2020-03-20 LAB — PROTIME INR (PT)
Lab: 1.1 mg/dL — ABNORMAL HIGH (ref 0.8–1.2)
Lab: 12 s — ABNORMAL HIGH (ref 8.5–14.4)

## 2020-03-20 LAB — COMPREHENSIVE METABOLIC PANEL
Lab: 0.8 mg/dL (ref 0.3–1.2)
Lab: 124 U/L — ABNORMAL HIGH (ref 25–110)
Lab: 140 MMOL/L — ABNORMAL LOW (ref 137–147)
Lab: 19 MMOL/L — ABNORMAL LOW (ref 21–30)
Lab: 24 U/L (ref 7–40)
Lab: 4.1 g/dL (ref 3.5–5.0)
Lab: 4.8 MMOL/L — ABNORMAL LOW (ref 3.5–5.1)
Lab: 6.7 g/dL (ref 6.0–8.0)
Lab: 88 mg/dL (ref 70–100)

## 2020-03-20 LAB — CBC
Lab: 3.8 M/UL — ABNORMAL LOW (ref 4.4–5.5)
Lab: 9.7 K/UL (ref 4.5–11.0)

## 2020-03-20 LAB — URINALYSIS DIPSTICK REFLEX TO CULTURE
Lab: NEGATIVE
Lab: NEGATIVE
Lab: NEGATIVE
Lab: NEGATIVE
Lab: NEGATIVE

## 2020-03-20 LAB — BNP (B-TYPE NATRIURETIC PEPTI): Lab: 97 pg/mL (ref 0–100)

## 2020-03-20 LAB — COVID-19 (SARS-COV-2) PCR

## 2020-03-20 LAB — HEMOGLOBIN A1C: Lab: 5.4 % (ref 4.0–6.0)

## 2020-03-20 LAB — URINALYSIS MICROSCOPIC REFLEX TO CULTURE

## 2020-03-20 NOTE — Patient Education
Pre-op education appt complete with pt and Denice, DIL; son, Shanon Brow, will be  w/ pt on DOS .  Informed of current visitor policy.  Reviewed and provided written instructions.  Instructed pt to be NPO at 2300 the night before surgery, except for a sip of water to take any meds that the [facility] pharmacist instructs pt to take on the morning of surgery.   Verified that  LD of Eliquis was 2/19.  Instructed to take CHG 4% shower X 2 with provided soap, prior to coming to hospital as per written instructions. Instructed to check in at [facility] Admissions at 0800 on DOS.  Discussed process for pre-op, intra-op and post-op recovery.   Discussed post-op pain; lifting and driving restrictions; importance of mobilization and s/s of infection after discharge.   Instructed pt to call the [place] office if having any health changes prior to surgery.  Pt verbalized understanding of all instructions.   Denies skin or dental issues; denies s/s UTI.  KCCQ complete - see flowsheet.   Labs drawn in CTS Lab.    Escorted pt to radiology for CXR.   All questions answered to the pt's satisfaction.    Covid Swab explained and completed at this appt; pt understands to self-isolate until surgery.  Escorted to [facility] Admitting for pre-registration and to [facility]. Pt and DIL given directions to PFTs after PAC appt.  AF

## 2020-03-21 ENCOUNTER — Inpatient Hospital Stay: Admit: 2020-03-21 | Discharge: 2020-03-21 | Payer: MEDICARE

## 2020-03-21 ENCOUNTER — Encounter: Admit: 2020-03-21 | Discharge: 2020-03-21 | Payer: MEDICARE

## 2020-03-21 DIAGNOSIS — M549 Dorsalgia, unspecified: Secondary | ICD-10-CM

## 2020-03-21 DIAGNOSIS — E079 Disorder of thyroid, unspecified: Secondary | ICD-10-CM

## 2020-03-21 DIAGNOSIS — I10 Essential (primary) hypertension: Secondary | ICD-10-CM

## 2020-03-21 DIAGNOSIS — I4891 Unspecified atrial fibrillation: Secondary | ICD-10-CM

## 2020-03-21 DIAGNOSIS — R011 Cardiac murmur, unspecified: Secondary | ICD-10-CM

## 2020-03-21 DIAGNOSIS — G4733 Obstructive sleep apnea (adult) (pediatric): Secondary | ICD-10-CM

## 2020-03-21 DIAGNOSIS — E785 Hyperlipidemia, unspecified: Secondary | ICD-10-CM

## 2020-03-21 DIAGNOSIS — N179 Acute kidney failure, unspecified: Secondary | ICD-10-CM

## 2020-03-21 DIAGNOSIS — I214 Non-ST elevation (NSTEMI) myocardial infarction: Secondary | ICD-10-CM

## 2020-03-21 DIAGNOSIS — I2510 Atherosclerotic heart disease of native coronary artery without angina pectoris: Secondary | ICD-10-CM

## 2020-03-21 DIAGNOSIS — I724 Aneurysm of artery of lower extremity: Secondary | ICD-10-CM

## 2020-03-21 DIAGNOSIS — C4490 Unspecified malignant neoplasm of skin, unspecified: Secondary | ICD-10-CM

## 2020-03-21 DIAGNOSIS — I350 Nonrheumatic aortic (valve) stenosis: Secondary | ICD-10-CM

## 2020-03-21 DIAGNOSIS — I340 Nonrheumatic mitral (valve) insufficiency: Secondary | ICD-10-CM

## 2020-03-21 DIAGNOSIS — C44621 Squamous cell carcinoma of skin of unspecified upper limb, including shoulder: Secondary | ICD-10-CM

## 2020-03-21 MED ORDER — ROCURONIUM 10 MG/ML IV SOLN
INTRAVENOUS | 0 refills | Status: DC
Start: 2020-03-21 — End: 2020-03-21
  Administered 2020-03-21: 15:00:00 40 mg via INTRAVENOUS

## 2020-03-21 MED ORDER — PHENYLEPHRINE 40 MCG/ML IN NS IV DRIP (STD CONC)
INTRAVENOUS | 0 refills | Status: DC
Start: 2020-03-21 — End: 2020-03-21
  Administered 2020-03-21 (×2): .4 ug/kg/min via INTRAVENOUS

## 2020-03-21 MED ORDER — CEFAZOLIN 1 GRAM IJ SOLR
INTRAVENOUS | 0 refills | Status: DC
Start: 2020-03-21 — End: 2020-03-21
  Administered 2020-03-21: 16:00:00 2 g via INTRAVENOUS

## 2020-03-21 MED ORDER — PROTAMINE 10 MG/ML IV SOLN
INTRAVENOUS | 0 refills | Status: DC
Start: 2020-03-21 — End: 2020-03-21
  Administered 2020-03-21: 17:00:00 40 mg via INTRAVENOUS

## 2020-03-21 MED ORDER — EPINEPHRINE 0.1MG NS 10ML SYR (AM)(OR)
INTRAVENOUS | 0 refills | Status: DC
Start: 2020-03-21 — End: 2020-03-21
  Administered 2020-03-21 (×4): 10 ug via INTRAVENOUS

## 2020-03-21 MED ORDER — LIDOCAINE (PF) 200 MG/10 ML (2 %) IJ SYRG
INTRAVENOUS | 0 refills | Status: DC
Start: 2020-03-21 — End: 2020-03-21
  Administered 2020-03-21: 15:00:00 80 mg via INTRAVENOUS

## 2020-03-21 MED ORDER — HEPARIN (PORCINE) 1,000 UNIT/ML IJ SOLN
INTRAVENOUS | 0 refills | Status: DC
Start: 2020-03-21 — End: 2020-03-21
  Administered 2020-03-21 (×2): 4000 [IU] via INTRAVENOUS

## 2020-03-21 MED ORDER — EPHEDRINE SULFATE 50 MG/ML IV SOLN
INTRAVENOUS | 0 refills | Status: DC
Start: 2020-03-21 — End: 2020-03-21
  Administered 2020-03-21: 16:00:00 10 mg via INTRAVENOUS
  Administered 2020-03-21: 16:00:00 20 mg via INTRAVENOUS

## 2020-03-21 MED ORDER — NOREPINEPHRINE IV DRIP STD CONC (AM)(OR)
INTRAVENOUS | 0 refills | Status: DC
Start: 2020-03-21 — End: 2020-03-21
  Administered 2020-03-21: 17:00:00 .04 ug/kg/min via INTRAVENOUS

## 2020-03-21 MED ORDER — PHENYLEPHRINE HCL IN 0.9% NACL 1 MG/10 ML (100 MCG/ML) IV SYRG
INTRAVENOUS | 0 refills | Status: DC
Start: 2020-03-21 — End: 2020-03-21
  Administered 2020-03-21 (×2): 200 ug via INTRAVENOUS

## 2020-03-21 MED ORDER — SUGAMMADEX 100 MG/ML IV SOLN
INTRAVENOUS | 0 refills | Status: DC
Start: 2020-03-21 — End: 2020-03-21
  Administered 2020-03-21: 17:00:00 350 mg via INTRAVENOUS

## 2020-03-21 MED ORDER — ONDANSETRON HCL (PF) 4 MG/2 ML IJ SOLN
INTRAVENOUS | 0 refills | Status: DC
Start: 2020-03-21 — End: 2020-03-21
  Administered 2020-03-21: 17:00:00 4 mg via INTRAVENOUS

## 2020-03-21 MED ORDER — ARTIFICIAL TEARS SINGLE DOSE DROPS GROUP
OPHTHALMIC | 0 refills | Status: DC
Start: 2020-03-21 — End: 2020-03-21
  Administered 2020-03-21: 15:00:00 1 [drp] via OPHTHALMIC

## 2020-03-21 MED ORDER — DEXAMETHASONE SODIUM PHOSPHATE 4 MG/ML IJ SOLN
INTRAVENOUS | 0 refills | Status: DC
Start: 2020-03-21 — End: 2020-03-21
  Administered 2020-03-21: 16:00:00 4 mg via INTRAVENOUS

## 2020-03-21 MED ORDER — PROPOFOL INJ 10 MG/ML IV VIAL
INTRAVENOUS | 0 refills | Status: DC
Start: 2020-03-21 — End: 2020-03-21
  Administered 2020-03-21: 15:00:00 100 mg via INTRAVENOUS

## 2020-03-21 MED ADMIN — ASPIRIN 81 MG PO CHEW [680]: 81 mg | ORAL | @ 15:00:00 | Stop: 2020-03-21 | NDC 66553000201

## 2020-03-21 MED ADMIN — SODIUM CHLORIDE 0.9 % IV SOLP [27838]: 1000.000 mL | INTRAVENOUS | @ 15:00:00 | Stop: 2020-03-21 | NDC 00338004904

## 2020-03-21 MED ADMIN — ASPIRIN 81 MG PO CHEW [680]: 81 mg | ORAL | @ 17:00:00 | NDC 57896091136

## 2020-03-22 ENCOUNTER — Encounter: Admit: 2020-03-22 | Discharge: 2020-03-22 | Payer: MEDICARE

## 2020-03-22 ENCOUNTER — Inpatient Hospital Stay: Admit: 2020-03-22 | Discharge: 2020-03-22 | Payer: MEDICARE

## 2020-03-22 MED ADMIN — ASPIRIN 81 MG PO CHEW [680]: 81 mg | ORAL | @ 14:00:00 | Stop: 2020-03-22 | NDC 66553000201

## 2020-03-22 MED ADMIN — ATORVASTATIN 40 MG PO TAB [77113]: 40 mg | ORAL | @ 14:00:00 | Stop: 2020-03-22 | NDC 00904629261

## 2020-03-22 MED ADMIN — METOPROLOL TARTRATE 50 MG PO TAB [5009]: 50 mg | ORAL | @ 14:00:00 | Stop: 2020-03-22 | NDC 62584026611

## 2020-03-22 MED ADMIN — APIXABAN 5 MG PO TAB [315778]: 5 mg | ORAL | @ 14:00:00 | Stop: 2020-03-22 | NDC 00003089431

## 2020-03-22 MED ADMIN — WATER FOR INJECTION, STERILE IJ SOLN [79513]: 20 mL | INTRAVENOUS | @ 17:00:00 | Stop: 2020-03-22 | NDC 00409488723

## 2020-03-22 MED ADMIN — CEFAZOLIN INJ 1GM IVP [210319]: 2 g | INTRAVENOUS | @ 17:00:00 | Stop: 2020-03-22 | NDC 60505614200

## 2020-03-22 MED ADMIN — METOPROLOL TARTRATE 25 MG PO TAB [37637]: 50 mg | ORAL | @ 02:00:00 | NDC 62584026511

## 2020-03-22 MED ADMIN — CEFAZOLIN INJ 1GM IVP [210319]: 2 g | INTRAVENOUS | @ 04:00:00 | Stop: 2020-03-23 | NDC 60505614200

## 2020-03-22 MED ADMIN — METHIMAZOLE 5 MG PO TAB [10553]: 5 mg | ORAL | @ 14:00:00 | Stop: 2020-03-22 | NDC 23155007001

## 2020-03-22 MED ADMIN — SENNOSIDES-DOCUSATE SODIUM 8.6-50 MG PO TAB [40926]: 2 | ORAL | @ 14:00:00 | Stop: 2020-03-22 | NDC 60687062211

## 2020-03-22 MED ADMIN — SENNOSIDES-DOCUSATE SODIUM 8.6-50 MG PO TAB [40926]: 2 | ORAL | @ 02:00:00 | NDC 60687062211

## 2020-03-22 MED ADMIN — TAMSULOSIN 0.4 MG PO CAP [80077]: 0.4 mg | ORAL | @ 14:00:00 | Stop: 2020-03-22 | NDC 00904640161

## 2020-03-22 MED ADMIN — FINASTERIDE 5 MG PO TAB [82679]: 5 mg | ORAL | @ 14:00:00 | Stop: 2020-03-22 | NDC 00904683006

## 2020-03-23 ENCOUNTER — Encounter: Admit: 2020-03-23 | Discharge: 2020-03-23 | Payer: MEDICARE

## 2020-03-23 NOTE — Telephone Encounter
Called and spoke with patient's daughter in law [place]. We discussed that patient has Medicare part D and the [employer] card would not cover. We discussed that his pharmacy states that he would probably not qualify for financial assistance due to his income.     Daughter in law verbalized understanding. She states that she will work with patient and pharmacy to figure out the best possible plan for patient to stay on Eliquis. She states that patient had previously been on Warfarin in the past and had issues. She states that patient is not interested in starting that medication again.     She states that she will call our office with any questions, comments, or concerns.

## 2020-03-26 ENCOUNTER — Encounter: Admit: 2020-03-26 | Discharge: 2020-03-26 | Payer: MEDICARE

## 2020-03-26 DIAGNOSIS — I10 Essential (primary) hypertension: Secondary | ICD-10-CM

## 2020-03-26 DIAGNOSIS — Z9889 Other specified postprocedural states: Secondary | ICD-10-CM

## 2020-03-26 DIAGNOSIS — I340 Nonrheumatic mitral (valve) insufficiency: Secondary | ICD-10-CM

## 2020-04-02 ENCOUNTER — Encounter: Admit: 2020-04-02 | Discharge: 2020-04-02 | Payer: MEDICARE

## 2020-04-02 ENCOUNTER — Ambulatory Visit: Admit: 2020-04-02 | Discharge: 2020-04-02 | Payer: MEDICARE

## 2020-04-02 DIAGNOSIS — I10 Essential (primary) hypertension: Secondary | ICD-10-CM

## 2020-04-02 DIAGNOSIS — I214 Non-ST elevation (NSTEMI) myocardial infarction: Secondary | ICD-10-CM

## 2020-04-02 DIAGNOSIS — E785 Hyperlipidemia, unspecified: Secondary | ICD-10-CM

## 2020-04-02 DIAGNOSIS — I2510 Atherosclerotic heart disease of native coronary artery without angina pectoris: Secondary | ICD-10-CM

## 2020-04-02 DIAGNOSIS — G4733 Obstructive sleep apnea (adult) (pediatric): Secondary | ICD-10-CM

## 2020-04-02 DIAGNOSIS — M549 Dorsalgia, unspecified: Secondary | ICD-10-CM

## 2020-04-02 DIAGNOSIS — I4891 Unspecified atrial fibrillation: Secondary | ICD-10-CM

## 2020-04-02 DIAGNOSIS — I724 Aneurysm of artery of lower extremity: Secondary | ICD-10-CM

## 2020-04-02 DIAGNOSIS — N179 Acute kidney failure, unspecified: Secondary | ICD-10-CM

## 2020-04-02 DIAGNOSIS — E079 Disorder of thyroid, unspecified: Secondary | ICD-10-CM

## 2020-04-02 DIAGNOSIS — R011 Cardiac murmur, unspecified: Secondary | ICD-10-CM

## 2020-04-02 DIAGNOSIS — C4490 Unspecified malignant neoplasm of skin, unspecified: Secondary | ICD-10-CM

## 2020-04-02 DIAGNOSIS — C44621 Squamous cell carcinoma of skin of unspecified upper limb, including shoulder: Secondary | ICD-10-CM

## 2020-04-02 DIAGNOSIS — I350 Nonrheumatic aortic (valve) stenosis: Secondary | ICD-10-CM

## 2020-04-05 ENCOUNTER — Encounter: Admit: 2020-04-05 | Discharge: 2020-04-05 | Payer: MEDICARE

## 2020-04-05 DIAGNOSIS — I2510 Atherosclerotic heart disease of native coronary artery without angina pectoris: Secondary | ICD-10-CM

## 2020-04-05 DIAGNOSIS — I724 Aneurysm of artery of lower extremity: Secondary | ICD-10-CM

## 2020-04-05 DIAGNOSIS — I350 Nonrheumatic aortic (valve) stenosis: Secondary | ICD-10-CM

## 2020-04-05 DIAGNOSIS — C44621 Squamous cell carcinoma of skin of unspecified upper limb, including shoulder: Secondary | ICD-10-CM

## 2020-04-05 DIAGNOSIS — E785 Hyperlipidemia, unspecified: Secondary | ICD-10-CM

## 2020-04-05 DIAGNOSIS — I4891 Unspecified atrial fibrillation: Secondary | ICD-10-CM

## 2020-04-05 DIAGNOSIS — R011 Cardiac murmur, unspecified: Secondary | ICD-10-CM

## 2020-04-05 DIAGNOSIS — N179 Acute kidney failure, unspecified: Secondary | ICD-10-CM

## 2020-04-05 DIAGNOSIS — I10 Essential (primary) hypertension: Secondary | ICD-10-CM

## 2020-04-05 DIAGNOSIS — C4490 Unspecified malignant neoplasm of skin, unspecified: Secondary | ICD-10-CM

## 2020-04-05 DIAGNOSIS — I214 Non-ST elevation (NSTEMI) myocardial infarction: Secondary | ICD-10-CM

## 2020-04-05 DIAGNOSIS — E079 Disorder of thyroid, unspecified: Secondary | ICD-10-CM

## 2020-04-05 DIAGNOSIS — G4733 Obstructive sleep apnea (adult) (pediatric): Secondary | ICD-10-CM

## 2020-04-05 DIAGNOSIS — M549 Dorsalgia, unspecified: Secondary | ICD-10-CM

## 2020-04-09 ENCOUNTER — Encounter: Admit: 2020-04-09 | Discharge: 2020-04-09 | Payer: MEDICARE

## 2020-04-10 ENCOUNTER — Encounter: Admit: 2020-04-10 | Discharge: 2020-04-10 | Payer: MEDICARE

## 2020-04-10 DIAGNOSIS — N183 Stage 3 chronic kidney disease, unspecified whether stage 3a or 3b CKD (HCC): Secondary | ICD-10-CM

## 2020-04-16 ENCOUNTER — Encounter: Admit: 2020-04-16 | Discharge: 2020-04-16 | Payer: MEDICARE

## 2020-04-16 ENCOUNTER — Ambulatory Visit: Admit: 2020-04-16 | Discharge: 2020-04-16 | Payer: MEDICARE

## 2020-04-16 DIAGNOSIS — I10 Essential (primary) hypertension: Secondary | ICD-10-CM

## 2020-04-16 DIAGNOSIS — I340 Nonrheumatic mitral (valve) insufficiency: Secondary | ICD-10-CM

## 2020-04-16 DIAGNOSIS — I724 Aneurysm of artery of lower extremity: Secondary | ICD-10-CM

## 2020-04-16 DIAGNOSIS — Z9889 Other specified postprocedural states: Secondary | ICD-10-CM

## 2020-04-16 DIAGNOSIS — C4490 Unspecified malignant neoplasm of skin, unspecified: Secondary | ICD-10-CM

## 2020-04-16 DIAGNOSIS — I214 Non-ST elevation (NSTEMI) myocardial infarction: Secondary | ICD-10-CM

## 2020-04-16 DIAGNOSIS — I350 Nonrheumatic aortic (valve) stenosis: Secondary | ICD-10-CM

## 2020-04-16 DIAGNOSIS — N184 Chronic kidney disease, stage 4 (severe): Secondary | ICD-10-CM

## 2020-04-16 DIAGNOSIS — M549 Dorsalgia, unspecified: Secondary | ICD-10-CM

## 2020-04-16 DIAGNOSIS — I4891 Unspecified atrial fibrillation: Secondary | ICD-10-CM

## 2020-04-16 DIAGNOSIS — N183 Stage 3 chronic kidney disease, unspecified whether stage 3a or 3b CKD (HCC): Secondary | ICD-10-CM

## 2020-04-16 DIAGNOSIS — C44621 Squamous cell carcinoma of skin of unspecified upper limb, including shoulder: Secondary | ICD-10-CM

## 2020-04-16 DIAGNOSIS — E079 Disorder of thyroid, unspecified: Secondary | ICD-10-CM

## 2020-04-16 DIAGNOSIS — N179 Acute kidney failure, unspecified: Secondary | ICD-10-CM

## 2020-04-16 DIAGNOSIS — G4733 Obstructive sleep apnea (adult) (pediatric): Secondary | ICD-10-CM

## 2020-04-16 DIAGNOSIS — I2510 Atherosclerotic heart disease of native coronary artery without angina pectoris: Secondary | ICD-10-CM

## 2020-04-16 DIAGNOSIS — R011 Cardiac murmur, unspecified: Secondary | ICD-10-CM

## 2020-04-16 DIAGNOSIS — E785 Hyperlipidemia, unspecified: Secondary | ICD-10-CM

## 2020-04-16 LAB — BNP (B-TYPE NATRIURETIC PEPTI): Lab: 199 pg/mL — ABNORMAL HIGH (ref 0–100)

## 2020-04-16 LAB — CBC
Lab: 10 FL (ref 7–11)
Lab: 13 % (ref 11–15)
Lab: 141 K/UL — ABNORMAL LOW (ref 150–400)
Lab: 3.6 M/UL — ABNORMAL LOW (ref 4.4–5.5)
Lab: 32 pg (ref 26–34)
Lab: 33 g/dL (ref 32.0–36.0)
Lab: 35 % — ABNORMAL LOW (ref 40–50)
Lab: 7.1 K/UL — ABNORMAL HIGH (ref 4.5–11.0)
Lab: 96 FL (ref 80–100)

## 2020-04-16 LAB — COMPREHENSIVE METABOLIC PANEL
Lab: 10 (ref 3–12)
Lab: 141 MMOL/L (ref 137–147)
Lab: 15 mL/min — ABNORMAL LOW (ref >60)
Lab: 20 MMOL/L — ABNORMAL LOW (ref 21–30)
Lab: 32 U/L (ref 7–56)
Lab: 5.1 MMOL/L (ref 3.5–5.1)

## 2020-04-16 LAB — PHOSPHORUS: Lab: 4.3 mg/dL (ref 2.0–4.5)

## 2020-04-16 LAB — URIC ACID: Lab: 7.6 mg/dL (ref 4.0–8.0)

## 2020-04-16 LAB — PROTEIN/CR RATIO,UR RAN
Lab: 0.7
Lab: 39 mg/dL
Lab: 59 mg/dL

## 2020-04-16 LAB — PARATHYROID HORMONE: Lab: 124 pg/mL — ABNORMAL HIGH (ref 10–65)

## 2020-04-23 ENCOUNTER — Encounter: Admit: 2020-04-23 | Discharge: 2020-04-23 | Payer: MEDICARE

## 2020-04-23 DIAGNOSIS — N179 Acute kidney failure, unspecified: Secondary | ICD-10-CM

## 2020-04-25 ENCOUNTER — Encounter: Admit: 2020-04-25 | Discharge: 2020-04-25 | Payer: MEDICARE

## 2020-04-25 ENCOUNTER — Ambulatory Visit: Admit: 2020-04-25 | Discharge: 2020-04-25 | Payer: MEDICARE

## 2020-04-25 DIAGNOSIS — I214 Non-ST elevation (NSTEMI) myocardial infarction: Secondary | ICD-10-CM

## 2020-04-25 DIAGNOSIS — C44621 Squamous cell carcinoma of skin of unspecified upper limb, including shoulder: Secondary | ICD-10-CM

## 2020-04-25 DIAGNOSIS — I724 Aneurysm of artery of lower extremity: Secondary | ICD-10-CM

## 2020-04-25 DIAGNOSIS — I2510 Atherosclerotic heart disease of native coronary artery without angina pectoris: Secondary | ICD-10-CM

## 2020-04-25 DIAGNOSIS — I350 Nonrheumatic aortic (valve) stenosis: Secondary | ICD-10-CM

## 2020-04-25 DIAGNOSIS — I10 Essential (primary) hypertension: Secondary | ICD-10-CM

## 2020-04-25 DIAGNOSIS — N179 Acute kidney failure, unspecified: Secondary | ICD-10-CM

## 2020-04-25 DIAGNOSIS — I340 Nonrheumatic mitral (valve) insufficiency: Secondary | ICD-10-CM

## 2020-04-25 DIAGNOSIS — M549 Dorsalgia, unspecified: Secondary | ICD-10-CM

## 2020-04-25 DIAGNOSIS — I5032 Chronic diastolic (congestive) heart failure: Secondary | ICD-10-CM

## 2020-04-25 DIAGNOSIS — C4490 Unspecified malignant neoplasm of skin, unspecified: Secondary | ICD-10-CM

## 2020-04-25 DIAGNOSIS — E785 Hyperlipidemia, unspecified: Secondary | ICD-10-CM

## 2020-04-25 DIAGNOSIS — G4733 Obstructive sleep apnea (adult) (pediatric): Secondary | ICD-10-CM

## 2020-04-25 DIAGNOSIS — I4821 Permanent atrial fibrillation: Secondary | ICD-10-CM

## 2020-04-25 DIAGNOSIS — Z9889 Other specified postprocedural states: Secondary | ICD-10-CM

## 2020-04-25 DIAGNOSIS — E079 Disorder of thyroid, unspecified: Secondary | ICD-10-CM

## 2020-04-25 DIAGNOSIS — R011 Cardiac murmur, unspecified: Secondary | ICD-10-CM

## 2020-04-25 DIAGNOSIS — N184 Chronic kidney disease, stage 4 (severe): Secondary | ICD-10-CM

## 2020-04-25 DIAGNOSIS — I4891 Unspecified atrial fibrillation: Secondary | ICD-10-CM

## 2020-04-25 LAB — CBC
Lab: 10 FL (ref 7–11)
Lab: 11 g/dL — ABNORMAL LOW (ref 13.5–16.5)
Lab: 14 % (ref 11–15)
Lab: 152 K/UL (ref 150–400)
Lab: 3.5 M/UL — ABNORMAL LOW (ref 4.4–5.5)
Lab: 32 pg — ABNORMAL LOW (ref >60)
Lab: 33 g/dL (ref 32.0–36.0)
Lab: 34 % — ABNORMAL LOW (ref 40–50)
Lab: 8.2 K/UL (ref 4.5–11.0)
Lab: 97 FL (ref 80–100)

## 2020-04-25 LAB — BASIC METABOLIC PANEL
Lab: 140 MMOL/L (ref 137–147)
Lab: 19 MMOL/L — ABNORMAL LOW (ref 21–30)
Lab: 5 MMOL/L (ref 3.5–5.1)

## 2020-04-25 LAB — PROTIME INR (PT)
Lab: 1.4 — ABNORMAL HIGH (ref 0.8–1.2)
Lab: 15 s — ABNORMAL HIGH (ref 8.5–14.4)

## 2020-04-25 LAB — BNP (B-TYPE NATRIURETIC PEPTI): Lab: 166 pg/mL — ABNORMAL HIGH (ref 0–100)

## 2020-04-25 MED ORDER — PERFLUTREN LIPID MICROSPHERES 1.1 MG/ML IV SUSP
1-20 mL | Freq: Once | INTRAVENOUS | 0 refills | Status: CP | PRN
Start: 2020-04-25 — End: ?
  Administered 2020-04-25: 17:00:00 1 mL via INTRAVENOUS

## 2020-04-25 NOTE — Progress Notes
Date of Service: 04/25/2020    Joel Hernandez is a 80 y.o. male.       HPI       I had the pleasure of seeing Joel Hernandez for 1 month post mitraclip follow-up.  He is a 80 year old with history of coronary artery disease, hypertension, permanent A. 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  He has had prior PCI to the circumflex and PDA in 2020.  He had an aortic valve replacement with a 25 Medtronic freestyle tissue valve and aortoplasty in 2020.  He was admitted in January for AKI with significantly elevated creatinine secondary to ATN and low blood pressure.  He had an echocardiogram that revealed a normally functioning aortic valve but he had moderate to severe mitral regurgitation and moderate tricuspid regurgitation.  His EF was 62%.  He was referred to [place] to consider MitraClip.  ?  He was admitted on February 23 and underwent deployment of a single MitraClip NT W device by Dr. Helen Hashimoto and Dr. Riley Nearing.  His postprocedure echocardiogram revealed an EF of 55%.  The clip was well-seated with no stenosis and mild residual regurgitation.  The mean gradient was 3 mmHg.  His aortic valve was functioning normally with no stenosis or regurgitation.  He had no significant issues during his hospital stay and was discharged on postprocedure day 2.  Nephrology was consulted for recent stage IV CKD and it was suspected that he has cardiorenal physiology.  He has been following with Dr. Rocco Serene.  He is scheduled for renal biopsy next week.    He has been doing well since discharge.  He states he gets short of breath with stairs but that has improved somewhat.  He is participating cardiac rehab and plans to do so even after the 36 sessions are up.  He is looking forward to going [place] hunting on April 1.  He denies any chest pain, orthopnea, lower extremity edema, palpitations, near-syncope or syncope.  He denies any issues with his access site.  His blood pressure at home has been slightly elevated but it sounds like at rehab it has been within normal limits.  We discussed checking it an hour after his medications at a time when he is at rest.  ?         Vitals:    04/25/20 1253   BP: 136/76   BP Source: Arm, Left Upper   Patient Position: Sitting   Pulse: 76   Weight: 98 kg (216 lb)   Height: 182.9 cm (6')   PainSc: Zero     Body mass index is 29.29 kg/m?Marland Kitchen     Past Medical History  Patient Active Problem List    Diagnosis Date Noted   ? S/P mitral valve clip implantation 03/21/2020     03/21/20: Dr. Helen Hashimoto      ? CKD (chronic kidney disease) stage 4, GFR 15-29 ml/min (HCC) 03/21/2020   ? Chronic diastolic heart failure, NYHA class 3 (HCC) 02/28/2020   ? ATN (acute tubular necrosis) (HCC) 02/11/2020   ? Hypotension 02/11/2020     Due to drug        ? Nonrheumatic mitral valve regurgitation 02/11/2020   ? AKI (acute kidney injury) (HCC) 02/09/2020   ? 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 Presence of permanent cardiac pacemaker 07/29/2018     07/23/18 -  Medtronic Dual Chamber Pacemaker Implantation     ? Unplanned return to operating room with repeat sternotomy for cardiac tamponade 07/29/2018     07/24/18 - Redo sternotomy with evacuation of hematoma     ? 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     ? S/p bare metal coronary artery stent 07/06/2018     x2 05/2018     ? RBBB 06/10/2018   ? Hypokalemia 06/10/2018   ? Hypomagnesemia 06/10/2018   ? 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     04/2018-found incidentally on CTA but confirmed by groin duplex     ? Foot drop 12/04/2016   ? HLD (hyperlipidemia) 12/04/2016   ? 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 Coronary artery disease due to calcified coronary lesion 01/19/2016     12/2015 - NSTEMI, LAD stent     ? Permanent atrial fibrillation (HCC) 01/18/2016     01/2016 - AF noted during hospitalization for unstable angina.  Attempt at DC cardioversion unsuccessful.  DC'ed on amiodarone & Eliquis.    01/2016 - Successful outpatient cardioversion at [facility]  Amiodarone continued    02/2016 - Office follow-up--back in AF, asymptomatic with good rate control.     ? Hypertension 09/15/2013     BP medication started in mid-30's     ? Obstructive sleep apnea 09/15/2013     Circa 2004 - Diagnosis established, CPAP started (Dr. Mayford Knife, pulmonologist in [facility])           Review of Systems   Constitutional: Positive for malaise/fatigue.   HENT: Negative.    Eyes: Negative.    Cardiovascular: Negative.    Respiratory: Negative.    Endocrine: Negative.    Hematologic/Lymphatic: Negative.    Skin: Negative.    Musculoskeletal: Negative.    Gastrointestinal: Negative.    Genitourinary: Negative.    Neurological: Negative.    Psychiatric/Behavioral: Negative.    All other systems reviewed and are negative.      Physical Exam  General Appearance: no acute distress  Skin: warm & intact  HEENT: unremarkable  Neck Veins: neck veins are flat & not distended  Carotid Arteries: no bruits  Chest Inspection: chest is normal in appearance  Auscultation/Percussion: lungs clear to auscultation, no rales, rhonchi, or wheezing  Cardiac Rhythm: regular rhythm & normal rate  Cardiac Auscultation: Normal S1 & S2, no S3 or S4, no rub  Murmurs: no cardiac murmurs   Extremities: no lower extremity edema; 2+ symmetric distal pulses  Abdominal Exam: soft, non-tender, no masses, bowel sounds normal  Liver & Spleen: no organomegaly  Neurologic Exam: oriented to time, place and person; no focal neurologic deficits  Psychiatric: Normal mood and affect.  Behavior is normal. Judgment and thought content normal.         Cardiovascular Studies  Preliminary EKG:    V-paced, rate 76 bpm. Cardiovascular Health Factors  Vitals BP Readings from Last 3 Encounters:   04/25/20 136/76   04/25/20 136/77   04/16/20 (!) 154/72     Wt Readings from Last 3 Encounters:   04/25/20 98 kg (216 lb)   04/25/20 98 kg (216 lb)   04/16/20 97.5 kg (215 lb)     BMI Readings from Last 3 Encounters:   04/25/20 29.29 kg/m?   04/25/20 30.99 kg/m?   04/16/20 30.85  kg/m?      Smoking Social History     Tobacco Use   Smoking Status Former Smoker   ? Packs/day: 0.50   ? Years: 15.00   ? Pack years: 7.50   ? Types: Cigarettes   ? Quit date: 11/20/1969   ? Years since quitting: 50.4   Smokeless Tobacco Never Used      Lipid Profile Cholesterol   Date Value Ref Range Status   02/21/2020 108  Final     HDL   Date Value Ref Range Status   02/21/2020 39 (L) >=40 Final     LDL   Date Value Ref Range Status   02/21/2020 57  Final     Triglycerides   Date Value Ref Range Status   02/21/2020 62  Final      Blood Sugar Hemoglobin A1C   Date Value Ref Range Status   03/20/2020 5.4 4.0 - 6.0 % Final     Comment:     The ADA recommends that most patients with type 1 and type 2 diabetes maintain   an A1c level <7%.       Glucose   Date Value Ref Range Status   04/16/2020 84 70 - 100 MG/DL Final   16/10/9602 540 (H) 70 - 100 MG/DL Final   98/11/9145 829 (H) 70 - 100 MG/DL Final     Glucose, POC   Date Value Ref Range Status   08/03/2018 92 70 - 100 MG/DL Final   56/21/3086 96 70 - 100 MG/DL Final   57/84/6962 952 (H) 70 - 100 MG/DL Final          Problems Addressed Today  Encounter Diagnoses   Name Primary?   ? Nonrheumatic mitral valve regurgitation Yes   ? S/P mitral valve clip implantation    ? Primary hypertension    ? Chronic diastolic heart failure, NYHA class 3 (HCC)    ? CKD (chronic kidney disease) stage 4, GFR 15-29 ml/min (HCC)    ? Coronary artery disease due to calcified coronary lesion    ? Permanent atrial fibrillation (HCC)        Assessment and Plan     1.  Mitral regurgitation status post MitraClip x1.    Had an echocardiogram today which revealed a well-seated clip.  There is mild residual regurgitation and a mean gradient of 5 mmHg.  His EF is low normal at 50%.  He needs to continue to increase his activity and participate in cardiac rehab.    2.  Chronic diastolic heart failure.  He has NYHA class II symptoms.  He has no evidence of volume overload on exam.  ?  3.  Coronary artery disease status post PCI to the circumflex and PDA.  He denies anginal symptoms.  ?  4.  Permanent atrial fibrillation.  He is on apixaban for anticoagulation.  ?  5.  History of aortic valve replacement.  His aortic valve is stable on echo today.  ?  6.  Hypertension.  His blood pressure has been slightly elevated.  He needs to continue to monitor at home and discussed with Dr. Barry Dienes who can adjust medications if necessary.  ?  I will see him back in a year with a repeat echo as required by the TVT registry.  He should continue to follow with Dr. Barry Dienes in the meantime.  Thank you for allowing [place] to participate in the care of this pleasant individual.  If you have any other  questions or concerns, please do not hesitate to contact us.  ?  J. Norman Clay, APRN  Structural Heart Nurse Practitioner  Pager [phone]         Current Medications (including today's revisions)  ? acetaminophen (TYLENOL) 325 mg tablet Take two tablets by mouth every 6 hours as needed.   ? amLODIPine (NORVASC) 5 mg tablet Take 5 mg by mouth daily.   ? apixaban (ELIQUIS) 2.5 mg tablet Take one tablet by mouth twice daily.   ? atorvastatin (LIPITOR) 40 mg tablet Take one tablet by mouth daily.   ? finasteride (PROSCAR) 5 mg tablet TAKE 1 TABLET BY MOUTH EVERY DAY   ? guaiFENesin (ROBITUSSIN) 100 mg/5 mL oral solution Take 10 mL by mouth every 6 hours as needed.   ? methIMAzole (TAPAZOLE) 10 mg tablet Take 5 mg by mouth daily.   ? metoprolol tartrate (LOPRESSOR) 50 mg tablet Take one tablet by mouth twice daily. Hold for HR < 55 and/or SBP<110   ? nitroglycerin (NITROSTAT) 0.4 mg tablet as Needed.   ? senna/docusate (SENOKOT-S) 8.6/50 mg tablet Take two tablets by mouth twice daily.   ? sildenafiL (VIAGRA) 100 mg tablet Take one tablet by mouth as Needed for Erectile dysfunction.   ? tamsulosin (FLOMAX) 0.4 mg capsule Take 1 capsule by mouth daily.

## 2020-04-26 ENCOUNTER — Encounter: Admit: 2020-04-26 | Discharge: 2020-04-26 | Payer: MEDICARE

## 2020-04-26 DIAGNOSIS — Z20822 Encounter for screening laboratory testing for COVID-19 virus in asymptomatic patient: Secondary | ICD-10-CM

## 2020-04-26 NOTE — Patient Education
Dear Joel Hernandez,    Thank you for choosing [facility] Interventional Radiology for your procedure. Your appointment information is listed below:    Appointment Date: 05/02/20  Appointment Time:  10am  Arrival Time: 8:30am  Location:     ? Main Campus: [address], [place], [place]  [address]  Parking: [address] Parking Garage      Interventional Radiology is located on the 2nd floor of the [employer].     [facility] in [place] will be contacting you to set up a Covid test on 4/1 or 4/4. If you do not hear from them tomorrow, please call [facility] at [phone].       INTERVENTIONAL RADIOLOGY  PRE-PROCEDURE INSTRUCTIONS SEDATION    You are scheduled for a procedure in Interventional Radiology with procedural sedation.  Please follow these instructions and any direction from your Primary Care/Managing Physician.  If you have questions about your procedure or need to reschedule please call [phone].    Medication Instructions:   You may take the following medications with a small sip of water:  < ALL MEDICATIONS BEFORE 8am, except Eliquis. >     Do not take the following medications: <Do not take Eliquis on 4/3, 4/4, 4/5, and 4/6. You may resume 24 hours post procedure.   Please contact prescribing physician to confirm they approve the request to hold the medication. If the physician disagrees with this request, please contact us at [phone].    Anesthesia diet Instructions  Diet Instructions:  a. Nothing to Eat or Drink after 2:00AM.  You should have nothing by mouth. This includes GUM or CANDY.  b. (2) hours before your procedure (9:00am) stop taking any medications.       Day of Exam Instructions:  1. Bathe or shower with an antibacterial soap prior to your appointment.  2. If you have a history of Obstructive Sleep Apnea (OSA) bring your CPAP/BIPAP.   3. Bring a list of your current medications and the dosages.  4. Wear comfortable clothing and leave valuables at home.  5. Arrive (1.5) hours prior to your appointment.  This time will be spent registering, interviewing, assessing, educating and preparing you for the test.  ? You will be with [place] anywhere from 30 minutes to 6 hours after your exam depending on your procedure.  6. You may be sedated for the procedure. A responsible adult must drive you home (no Benedetto Goad, taxis or buses are allowed) and stay with you overnight. If you do not have a driver we will be unable to perform your procedure.   7. You will not be able to return to work or drive the same day if receiving sedation.

## 2020-05-02 ENCOUNTER — Ambulatory Visit: Admit: 2020-05-02 | Discharge: 2020-05-02 | Payer: MEDICARE

## 2020-05-02 ENCOUNTER — Encounter: Admit: 2020-05-02 | Discharge: 2020-05-02 | Payer: MEDICARE

## 2020-05-02 DIAGNOSIS — N189 Chronic kidney disease, unspecified: Secondary | ICD-10-CM

## 2020-05-02 DIAGNOSIS — N179 Acute kidney failure, unspecified: Secondary | ICD-10-CM

## 2020-05-02 LAB — CBC AND DIFF
Lab: 0 K/UL (ref 0–0.20)
Lab: 0.5 K/UL (ref 0–0.80)
Lab: 1 % (ref 0–2)
Lab: 1 % (ref 0–5)
Lab: 10 % — ABNORMAL LOW (ref 24–44)
Lab: 12 g/dL — ABNORMAL LOW (ref 13.5–16.5)
Lab: 14 % (ref 11–15)
Lab: 140 K/UL — ABNORMAL LOW (ref 150–400)
Lab: 3.7 M/UL — ABNORMAL LOW (ref 4.4–5.5)
Lab: 32 pg (ref 26–34)
Lab: 33 g/dL (ref 32.0–36.0)
Lab: 35 % — ABNORMAL LOW (ref 40–50)
Lab: 6 % (ref 4–12)
Lab: 6.6 K/UL (ref 1.8–7.0)
Lab: 8.1 K/UL (ref 4.5–11.0)
Lab: 82 % — ABNORMAL HIGH (ref 41–77)
Lab: 96 FL (ref 80–100)
Lab: 0 K/UL (ref 0–0.20)
Lab: 0.1 K/UL (ref 0–0.45)
Lab: 0.6 K/UL (ref 0–0.80)
Lab: 1 % (ref 0–2)
Lab: 1 % (ref 0–5)
Lab: 1 K/UL (ref 1.0–4.8)
Lab: 10 FL (ref 7–11)
Lab: 10 g/dL — ABNORMAL LOW (ref 13.5–16.5)
Lab: 11 % — ABNORMAL LOW (ref 24–44)
Lab: 13 % (ref 11–15)
Lab: 141 K/UL — ABNORMAL LOW (ref 150–400)
Lab: 3.1 M/UL — ABNORMAL LOW (ref 4.4–5.5)
Lab: 30 % — ABNORMAL LOW (ref 40–50)
Lab: 32 pg (ref 26–34)
Lab: 34 g/dL (ref 32.0–36.0)
Lab: 7 % (ref 4–12)
Lab: 7.6 K/UL — ABNORMAL HIGH (ref 1.8–7.0)
Lab: 80 % — ABNORMAL HIGH (ref 41–77)
Lab: 9.6 K/UL (ref 4.5–11.0)
Lab: 96 FL (ref 80–100)

## 2020-05-02 MED ORDER — ACETAMINOPHEN 325 MG PO TAB
650 mg | ORAL | 0 refills | Status: AC | PRN
Start: 2020-05-02 — End: ?
  Administered 2020-05-03: 23:00:00 650 mg via ORAL

## 2020-05-02 MED ORDER — SENNOSIDES-DOCUSATE SODIUM 8.6-50 MG PO TAB
1 | Freq: Every day | ORAL | 0 refills | Status: AC | PRN
Start: 2020-05-02 — End: ?

## 2020-05-02 MED ORDER — METOPROLOL TARTRATE 50 MG PO TAB
50 mg | Freq: Two times a day (BID) | ORAL | 0 refills | Status: AC
Start: 2020-05-02 — End: ?
  Administered 2020-05-03 – 2020-05-06 (×8): 50 mg via ORAL

## 2020-05-02 MED ORDER — METHIMAZOLE 5 MG PO TAB
5 mg | Freq: Every day | ORAL | 0 refills | Status: AC
Start: 2020-05-02 — End: ?
  Administered 2020-05-03 – 2020-05-06 (×4): 5 mg via ORAL

## 2020-05-02 MED ORDER — TAMSULOSIN 0.4 MG PO CAP
.4 mg | Freq: Every day | ORAL | 0 refills | Status: AC
Start: 2020-05-02 — End: ?
  Administered 2020-05-03 – 2020-05-06 (×4): 0.4 mg via ORAL

## 2020-05-02 MED ORDER — ONDANSETRON HCL (PF) 4 MG/2 ML IJ SOLN
4 mg | Freq: Once | INTRAVENOUS | 0 refills | PRN
Start: 2020-05-02 — End: ?

## 2020-05-02 MED ORDER — ONDANSETRON 4 MG PO TBDI
4 mg | ORAL | 0 refills | Status: AC | PRN
Start: 2020-05-02 — End: ?

## 2020-05-02 MED ORDER — FINASTERIDE 5 MG PO TAB
5 mg | Freq: Every day | ORAL | 0 refills | Status: AC
Start: 2020-05-02 — End: ?
  Administered 2020-05-03 – 2020-05-06 (×4): 5 mg via ORAL

## 2020-05-02 MED ORDER — PROPOFOL 10 MG/ML IV EMUL 20 ML (INFUSION)(AM)(OR)
INTRAVENOUS | 0 refills | Status: DC
Start: 2020-05-02 — End: 2020-05-02
  Administered 2020-05-02: 15:00:00 120 ug/kg/min via INTRAVENOUS

## 2020-05-02 MED ORDER — FENTANYL CITRATE (PF) 50 MCG/ML IJ SOLN
50 ug | INTRAVENOUS | 0 refills | PRN
Start: 2020-05-02 — End: ?

## 2020-05-02 MED ORDER — AMLODIPINE 5 MG PO TAB
5 mg | Freq: Every day | ORAL | 0 refills | Status: AC
Start: 2020-05-02 — End: ?
  Administered 2020-05-03 – 2020-05-06 (×4): 5 mg via ORAL

## 2020-05-02 MED ORDER — POLYETHYLENE GLYCOL 3350 17 GRAM PO PWPK
1 | Freq: Every day | ORAL | 0 refills | Status: AC | PRN
Start: 2020-05-02 — End: ?

## 2020-05-02 MED ORDER — ONDANSETRON HCL (PF) 4 MG/2 ML IJ SOLN
4 mg | INTRAVENOUS | 0 refills | Status: AC | PRN
Start: 2020-05-02 — End: ?
  Administered 2020-05-03: 4 mg via INTRAVENOUS

## 2020-05-02 MED ORDER — ATORVASTATIN 40 MG PO TAB
40 mg | Freq: Every day | ORAL | 0 refills | Status: AC
Start: 2020-05-02 — End: ?
  Administered 2020-05-03 – 2020-05-06 (×4): 40 mg via ORAL

## 2020-05-02 MED ORDER — MELATONIN 5 MG PO TAB
5 mg | Freq: Every evening | ORAL | 0 refills | Status: AC | PRN
Start: 2020-05-02 — End: ?
  Administered 2020-05-03: 01:00:00 5 mg via ORAL

## 2020-05-02 MED ORDER — SODIUM CHLORIDE 0.9 % IV SOLP
INTRAVENOUS | 0 refills | Status: DC
Start: 2020-05-02 — End: 2020-05-02
  Administered 2020-05-02: 15:00:00 via INTRAVENOUS

## 2020-05-02 NOTE — H&P (View-Only)
Pre Procedure History and Physical/Sedation Plan-OP    Procedure Date: 05/02/2020     Planned Procedure(s):  Native kidney biopsy     Indication for exam:  AKI and elevated CR   __________________________________________________________________    Chief Complaint:  See above     History of Present Illness: Joel Hernandez is a 80 y.o. male that presents to IR today for native kidney biopsy. Patient reports feeling well on exam today except for fatigue.     Patient Active Problem List    Diagnosis Date Noted   ? S/P mitral valve clip implantation 03/21/2020   ? CKD (chronic kidney disease) stage 4, GFR 15-29 ml/min (HCC) 03/21/2020   ? Chronic diastolic heart failure, NYHA class 3 (HCC) 02/28/2020   ? ATN (acute tubular necrosis) (HCC) 02/11/2020   ? Hypotension 02/11/2020   ? Nonrheumatic mitral valve regurgitation 02/11/2020   ? AKI (acute kidney injury) (HCC) 02/09/2020   ? 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 Presence of permanent cardiac pacemaker 07/29/2018   ? Unplanned return to operating room with repeat sternotomy for cardiac tamponade 07/29/2018   ? S/P AVR (aortic valve replacement) and aortoplasty 07/21/2018   ? S/p bare metal coronary artery stent 07/06/2018   ? RBBB 06/10/2018   ? Hypokalemia 06/10/2018   ? Hypomagnesemia 06/10/2018   ? 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   ? Foot drop 12/04/2016   ? HLD (hyperlipidemia) 12/04/2016   ? 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 Coronary artery disease due to calcified coronary lesion 01/19/2016   ? Permanent atrial fibrillation (HCC) 01/18/2016   ? Hypertension 09/15/2013   ? Obstructive sleep apnea 09/15/2013     Medical History:   Diagnosis Date   ? AKI (acute kidney injury) (HCC)     Elevated Creatine   ? Aortic stenosis    ? Atrial fibrillation (HCC) ? Back pain    ? CAD (coronary artery disease)    ? Disorder of thyroid gland    ? Heart murmur 09/15/2013   ? Hyperlipidemia    ? Hypertension 09/15/2013   ? NSTEMI (non-ST elevated myocardial infarction) (HCC) 12/2015    s/p stent placement   ? OSA on CPAP     No CPAP use after 55lb weight loss   ? 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 SCC (squamous cell carcinoma), arm    ? Severe aortic stenosis    ? Skin cancer       Surgical History:   Procedure Laterality Date   ? LUMBAR LAMINECTOMY  2004    L4-5   ? LUMBAR LAMINECTOMY  2011    Hemilaminectomies L 2-3 L3-4   ? RIGHT LUMBAR 4-5 REDO LAMINECTOMY AND LATERAL RECESS DECOMPRESSION Right 12/04/2016    Performed by Theotis Barrio, MD at [facility] OR   ? LAMINECTOMY/ FACETECTOMY/ FORAMINOTOMY WITH DECOMPRESSION - 1 VERTEBRAL SEGMENT - LUMBAR  12/04/2016    Performed by Theotis Barrio, MD at [facility] OR   ? CORONARY STENT PLACEMENT  12/2016   ? KNEE REPLACEMENT Right 08/2017   ? ANGIOGRAPHY CORONARY ARTERY WITH LEFT HEART CATHETERIZATION N/A 05/24/2018    Performed by Marcell Barlow, MD at [facility] CATH LAB   ? POSSIBLE PERCUTANEOUS CORONARY  STENT PLACEMENT WITH ANGIOPLASTY N/A 05/24/2018    Performed by Marcell Barlow, MD at [facility] CATH LAB   ? CLOSURE PSEUDOANEURYSM BY INJECTION- LOWER EXTREMITY N/A 05/26/2018    Performed by Marcell Barlow, MD at [facility] CATH LAB   ? CORONARY STENT PLACEMENT  05/2018    x2   ? PACEMAKER INSERTION  06/2018   ? Aortic Root replacement (Freestyle)/Implantation of coronary artery buttons, Circ arrest N/A 07/21/2018    Performed by Earlene Plater (Trip) L III, MD at [facility] CVOR   ? INSERTION/ REPLACEMENT PERMANENT PACEMAKER WITH ATRIAL AND VENTRICULAR LEAD Left 07/23/2018    Performed by Lorain Childes, MD at [facility] EP LAB   ? FLUOROSCOPY - CARDIAC  07/23/2018    Performed by Lorain Childes, MD at [facility] EP LAB   ? EXPLORATION POSTOPERATIVE WOUND - CHEST N/A 07/24/2018    Performed by Earlene Plater (Trip) Elbert Ewings III, MD at [facility] CVOR   ? Percutaneous Mitral Valve Repair with MitraClip/TEE N/A 03/21/2020    Performed by Arby Barrette, MD at [facility] CVOR   ? COLONOSCOPY     ? HEART CATHETERIZATION  01/2016, 05/2018    Stent x 1 LAD placement following NSTEMI, 2 stents placed 04/2018   ? HX HEART CATHETERIZATION     ? SKIN CANCER EXCISION  2017, 2018    SCC Arm       Medications Prior to Admission   Medication Sig Dispense Refill Last Dose   ? acetaminophen (TYLENOL) 325 mg tablet Take two tablets by mouth every 6 hours as needed.  0    ? amLODIPine (NORVASC) 5 mg tablet Take 5 mg by mouth daily.      ? apixaban (ELIQUIS) 2.5 mg tablet Take one tablet by mouth twice daily. 180 tablet 0    ? atorvastatin (LIPITOR) 40 mg tablet Take one tablet by mouth daily. 90 tablet 3    ? finasteride (PROSCAR) 5 mg tablet TAKE 1 TABLET BY MOUTH EVERY DAY 90 tablet 0    ? guaiFENesin (ROBITUSSIN) 100 mg/5 mL oral solution Take 10 mL by mouth every 6 hours as needed.      ? methIMAzole (TAPAZOLE) 10 mg tablet Take 5 mg by mouth daily.      ? metoprolol tartrate (LOPRESSOR) 50 mg tablet Take one tablet by mouth twice daily. Hold for HR < 55 and/or SBP<110 180 tablet 3    ? nitroglycerin (NITROSTAT) 0.4 mg tablet as Needed.      ? senna/docusate (SENOKOT-S) 8.6/50 mg tablet Take two tablets by mouth twice daily. 90 tablet 0    ? sildenafiL (VIAGRA) 100 mg tablet Take one tablet by mouth as Needed for Erectile dysfunction. 10 tablet     ? tamsulosin (FLOMAX) 0.4 mg capsule Take 1 capsule by mouth daily.        Allergies   Allergen Reactions   ? Lisinopril SEE COMMENTS     Reports respiratory distress and back pain       Social History:   Social History     Tobacco Use   ? Smoking status: Former Smoker     Packs/day: 0.50     Years: 15.00     Pack years: 7.50     Types: Cigarettes     Quit date: 11/20/1969     Years since quitting: 50.4   ? Smokeless tobacco: Never Used   Substance Use Topics   ? Alcohol use: No      Family History  Problem Relation Age of Onset   ? Heart Disease Mother         Review of Systems  Constitutional: negative for fevers and chills; positive for fatigue   Respiratory: negative for cough or dyspnea  Gastrointestinal: negative for nausea, vomiting and abdominal pain    Previous Anesthetic/Sedation History:  Per anesthesia.      Code Status: Prior    Physical Exam:  Vital Signs: Last Filed In 24 Hours Vital Signs: 24 Hour Range   BP: 143/89 (04/06 0850)  Temp: 36.6 ?C (97.9 ?F) (04/06 1610)  Pulse: 65 (04/06 0850)  Respirations: 14 PER MINUTE (04/06 0850)  SpO2: 99 % (04/06 0850)  SpO2 Pulse: 65 (04/06 0850) BP: (143)/(89)   Temp:  [36.6 ?C (97.9 ?F)]   Pulse:  [65]   Respirations:  [14 PER MINUTE]   SpO2:  [99 %]             General appearance: alert and no distress  Neurologic: Grossly normal, at baseline  Lungs: Nonlabored with normal effort  Abdomen: soft, non-tender.       Airway: Per anesthesia.    Anesthesia Classification:  Per anesthesia.    Pre procedure anxiolysis plan: Per anesthesia.    Sedation/Medication Plan: Per anesthesia.    Personal history of sedation complications: Per anesthesia.    Family history of sedation complications: Per anesthesia.     Medications for Reversal: Per anesthesia.    Discussion/Reviews:  Physician has discussed risks and alternatives of this type of sedation and above planned procedures with patient  NPO Status: Acceptable  Pregnancy Status: N/A        Lab/Radiology/Other Diagnostic Tests:  Labs:  Pertinent labs reviewed - hgb 11.8, plt 152, INR 1.4, GFR 14 - 1/4 risk factors for bleeding. Only risk factor is low GFR.        Patient reports holding eliquis as directed.     Velora Mediate, APRN-NP  Pager [phone]

## 2020-05-02 NOTE — Progress Notes
Anesthesia staff present to monitor patient airway, vital signs, and medications. See anesthesia docflow. This RN will assist as needed.

## 2020-05-02 NOTE — Anesthesia Post-Procedure Evaluation
Post-Anesthesia Evaluation    Name: Joel Hernandez      MRN: [id number]     DOB: 1940-07-27     Age: 80 y.o.     Sex: male   __________________________________________________________________________     Procedure Information     Anesthesia Start Date/Time: 05/02/20 0933    Scheduled providers: Rhunette Croft, MD; Aundra Dubin, RN; Shalece Staffa, Curlene Dolphin, MD; Louie Boston, RT(R),LRT    Procedure: IR NATIVE RENAL BIOPSY    Location: Interventional Radiology: [place]  BP: 117/63 (04/06 1030)  Temp: 36.6 C (97.9 F) (04/06 1005)  Pulse: 60 (04/06 1030)  Respirations: 17 PER MINUTE (04/06 1030)  SpO2: 97 % (04/06 1030)  SpO2 Pulse: 66 (04/06 1030)   Vitals Value Taken Time   BP 117/63 05/02/20 1030   Temp 36.6 C (97.9 F) 05/02/20 1005   Pulse 60 05/02/20 1030   Respirations 17 PER MINUTE 05/02/20 1030   SpO2 97 % 05/02/20 1030   ABP     ART BP           Post Anesthesia Evaluation Note    Evaluation location: Pre/Post  Patient participation: recovered; patient participated in evaluation  Level of consciousness: alert    Pain score: 0  Pain management: adequate    Hydration: normovolemia  Temperature: 36.0C - 38.4C  Airway patency: adequate    Perioperative Events       Post-op nausea and vomiting: no PONV    Postoperative Status  Cardiovascular status: hemodynamically stable  Respiratory status: spontaneous ventilation  Follow-up needed: none        Perioperative Events  There were no known complications for this encounter.

## 2020-05-02 NOTE — Progress Notes
AOD at bedside for admission.

## 2020-05-02 NOTE — Progress Notes
Pt signed out of Phase 1 care per Short MD

## 2020-05-02 NOTE — Anesthesia Pre-Procedure Evaluation
Anesthesia Pre-Procedure Evaluation    Name: Joel Hernandez      MRN: [id number]     DOB: 1940/10/20     Age: 80 y.o.     Sex: male   _________________________________________________________________________     Procedure Info:   Procedure Information     Date/Time: 05/02/20 1000    Scheduled providers: Marcelline Mates, MD; Lowella Curb, RN; Jacon Whetzel, Cheri Guppy, MD; Margarito Liner, RT(R),LRT    Procedure: IR NATIVE RENAL BIOPSY    Location: Interventional Radiology: [facility]          Physical Assessment  Vital Signs (last filed in past 24 hours):  BP: 143/89 (04/06 0850)  Temp: 36.6 ?C (97.9 ?F) (04/06 4540)  Pulse: 65 (04/06 0850)  Respirations: 14 PER MINUTE (04/06 0850)  SpO2: 99 % (04/06 0850)      Patient History   Allergies   Allergen Reactions   ? Lisinopril SEE COMMENTS     Reports respiratory distress and back pain        Current Medications    Medication Directions   acetaminophen (TYLENOL) 325 mg tablet Take two tablets by mouth every 6 hours as needed.   amLODIPine (NORVASC) 5 mg tablet Take 5 mg by mouth daily.   apixaban (ELIQUIS) 2.5 mg tablet Take one tablet by mouth twice daily.   atorvastatin (LIPITOR) 40 mg tablet Take one tablet by mouth daily.   finasteride (PROSCAR) 5 mg tablet TAKE 1 TABLET BY MOUTH EVERY DAY   methIMAzole (TAPAZOLE) 10 mg tablet Take 5 mg by mouth daily.   metoprolol tartrate (LOPRESSOR) 50 mg tablet Take one tablet by mouth twice daily. Hold for HR < 55 and/or SBP<110   nitroglycerin (NITROSTAT) 0.4 mg tablet as Needed.   sildenafiL (VIAGRA) 100 mg tablet Take one tablet by mouth as Needed for Erectile dysfunction.   tamsulosin (FLOMAX) 0.4 mg capsule Take 1 capsule by mouth daily.         Review of Systems/Medical History        PONV Screening: Non-smoker  No history of anesthetic complications  No family history of anesthetic complications      Airway - negative        Pulmonary       Not a current smoker (8 PYH; quit 1970s)        Sleep apnea (lost 70 lbs; no longer wears CPAP)      Cardiovascular       Recent diagnostic studies:          ECG, echocardiogram and stress test          Echo:   ? The left ventricular systolic function is normal. The ejection fraction by Simpson's biplane method is 50%  ? The right ventricle is mildly dilated. The right ventricular systolic function is normal.  ? MitraClip X 1 with mild residual regurgitation present.  ? Bioprosthetic aortic valve present- gradient not assessed.  ? Moderate tricuspid valve regurgitation present.          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 2017), > 6 months      Coronary artery disease (CAD status post PCI(PCI to mid left circumflex, OM 2, middle right PDA)      PTCA (LAD stent 2017; 2 bare metal stents 2020)        Dysrhythmias; atrial fibrillation      CHF      Hyperlipidemia      Dyspnea on exertion      07/21/2018: S/P AVR (aortic valve replacement) and aortoplasty; Aortic Root Replacement with circulatory arrest          Pulmonary hypertension (PA on ECHO 01/2020)      GI/Hepatic/Renal          Renal disease (Recent increase in Creat to 3.5; follows with nephrology): CRI and ARF       Neuro/Psych - negative        Musculoskeletal         Back pain (lumbar lami x 3)      Endocrine/Other       No diabetes      Hyperthyroidism (tapazole ongoing)      History of blood transfusion (no known reaction)      Malignancy (Skin CA, squamous, 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    Abdominal Findings:       Obese    Neurological Findings:       Alert and oriented x 3    Constitutional findings:       No acute distress      Well-developed       Diagnostic Tests  Hematology:   Lab Results   Component Value Date    HGB 11.8 04/25/2020    HCT 34.7 04/25/2020    PLTCT 152 04/25/2020    WBC 8.2 04/25/2020    NEUT 76 02/06/2020    ANC 6.88 02/06/2020    ALC 1.10 02/06/2020    MONA 7 02/06/2020    AMC 0.58 02/06/2020    EOSA 3 02/06/2020    ABC 0.05 02/06/2020    MCV 97.0 04/25/2020    MCH 32.9 04/25/2020    MCHC 33.9 04/25/2020    MPV 10.3 04/25/2020    RDW 14.1 04/25/2020  General Chemistry:   Lab Results   Component Value Date    NA 140 04/25/2020    K 5.0 04/25/2020    CL 109 04/25/2020    CO2 19 04/25/2020    GAP 12 04/25/2020    BUN 68 04/25/2020    CR 4.12 04/25/2020    GLU 92 04/25/2020    CA 9.1 04/25/2020    ALBUMIN 4.2 04/16/2020    LACTIC 2.6 07/25/2018    OBSCA 1.63 07/25/2018    MG 2.0 03/22/2020    TOTBILI 0.5 04/16/2020    PO4 4.3 04/16/2020      Coagulation:   Lab Results   Component Value Date    PT 15.3 04/25/2020    PTT 30.4 03/21/2020    INR 1.4 04/25/2020     Carotid US 2020  Interpretation Summary    1. Moderate stenosis [50-79%] noted in right proximal internal carotid artery with moderate atheromatous plaque and turbulent flow. Based on end-diastolic velocity and ratio criteria stenosis is likely less than 70%.   2. No other hemodynamically significant (>50%) stenosis measured in the bilateral common carotid and left internal carotid arteries. Mild atheromatous plaque visualized in left internal carotid artery.  3. There is normal antegrade flow in bilateral vertebral arteries  4. No evidence of proximal subclavian stenosis bilaterally  5. Bilaterally enlarged thyroid gland noted.  Dedicated thyroid ultrasound is recommended if clinically indicated.  ?  ECHO 02/10/2020  Interpretation Summary    Normal left ventricular contractility.  Biplane LVEF 62%.  Severe concentric left ventricular hypertrophy.  Moderate right ventricular dilatation with probable normal right ventricular contractility.    Severe left atrial and mild right atrial dilatation.   Device lead seen in the right atrium and right ventricle.  Markedly elevated central venous pressure.  Nonspecific mitral valve leaflet thickening and calcified mitral chordae and mitral annulus.  Monophasic mitral diastolic flow.  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  Normal tricuspid valve motion.  Moderate tricuspid regurgitation.  Estimated PA pressure 46 mmHg.   25 mm Medtronic Freestyle bioprosthetic aortic valve appears well-seated. ?Bioprosthetic atrial valve cusp motion is not well visualized.  Peak aortic flow velocity 1.4 m/s, mean aortic gradient 5 mmHg, peak gradient 7 mmHg, aortic velocity ratio 0.79, and calculated effective orifice area 2.24 cm?Marland Kitchen ?No aortic regurgitation.  Pulmonic valve in the pulmonary artery not well visualized.  Trace pulmonic regurgitation.  Normal aortic root and proximal ascending aortic dimensions.  The transverse aorta is not visualized.  No pericardial effusion.    EKG v-pacing    Anesthesia Plan    ASA score: 4   Plan: MAC  Induction method: intravenous  NPO status: acceptable      Informed Consent  Anesthetic plan and risks discussed with patient.  Use of blood products discussed with patient  Blood Consent: consented      Plan discussed with: anesthesiologist and CRNA.

## 2020-05-02 NOTE — H&P (View-Only)
Admission History and Physical Examination      Name:  Author Hatlestad                                             MRN:  [id number]   Admission Date:  05/02/2020                     Assessment/Plan:    Active Problems:    * No active hospital problems. *    80 y.o. male with PMH of CAD, HT, HLD, hyperthyroidism, Aortic valce replacement, mitral clip and CKD who presents for native kidney biopsy    Native kidney biopsy  -S/P IR biopsy on 4/6  Plan  >Monitor for evidence of post procedure bleeding or infection  >Hold apixiban today      Permanent atrial fibrillation   - Successful outpatient cardioversion at [facility] ?  - 07/23/18: Medtronic Dual Chamber Pacemaker Implantation  - hold apixiban secondary to renal biopsy  - continue PTA metoprolol for rate control  ?  HTN  - continue PTA norvasc  - continue PTA metoprolol  - losartan was stopped by his PCP because of aki  ?  CAD  - 12/2015 - NSTEMI, LAD stent  - denies any chest pain  - continue PTA metoprolol and satin  ?  Hyperthyroidism  - continue PTA methimazole  - check TSH and FT4  ?  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  BPH  - continue flomax and finasteride  ?  OSA  - CPAP  ?  07/23/18 - Medtronic Dual Chamber Pacemaker Implantation    FEN:  Regular diet , monitor and replace lytes  Admit to med private for extended recovery.  _______________________________________________________________________________  Primary Care Physician: Steva Ready      Chief Complaint:  Renal biopsy  History of Present Illness: Joel Hernandez is a 80 y.o. male with PMH of CAD, HT, HLD, hyperthyroidism, Aortic valvee replacement, mitral clip and CKD who presents for native kidney biopsy.  He currently denies any chest pain, shortness of air, nausea, emesis or changes in his bowel or bladder function.    Medical History:   Diagnosis Date   ? AKI (acute kidney injury) (HCC)     Elevated Creatine   ? Aortic stenosis    ? Atrial fibrillation (HCC)    ? Back pain    ? CAD (coronary artery disease)    ? Disorder of thyroid gland    ? Heart murmur 09/15/2013   ? Hyperlipidemia    ? Hypertension 09/15/2013   ? NSTEMI (non-ST elevated myocardial infarction) (HCC) 12/2015    s/p stent placement   ? OSA on CPAP     No CPAP use after 55lb weight loss   ? 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 SCC (squamous cell carcinoma), arm    ? Severe aortic stenosis    ? Skin cancer      Surgical History:   Procedure Laterality Date   ? LUMBAR LAMINECTOMY  2004    L4-5   ? LUMBAR LAMINECTOMY  2011    Hemilaminectomies L 2-3  L3-4   ? RIGHT LUMBAR 4-5 REDO LAMINECTOMY AND LATERAL RECESS DECOMPRESSION Right 12/04/2016    Performed by Theotis Barrio, MD at [facility] OR   ? LAMINECTOMY/ FACETECTOMY/ FORAMINOTOMY WITH DECOMPRESSION - 1 VERTEBRAL SEGMENT - LUMBAR  12/04/2016    Performed by Theotis Barrio, MD at [facility] OR   ? CORONARY STENT PLACEMENT  12/2016   ? KNEE REPLACEMENT Right 08/2017   ? ANGIOGRAPHY CORONARY ARTERY WITH LEFT HEART CATHETERIZATION N/A 05/24/2018    Performed by Marcell Barlow, MD at [facility] CATH LAB   ? POSSIBLE PERCUTANEOUS CORONARY STENT PLACEMENT WITH ANGIOPLASTY N/A 05/24/2018    Performed by Marcell Barlow, MD at [facility] [place] CATH LAB   ? CLOSURE PSEUDOANEURYSM BY INJECTION- LOWER EXTREMITY N/A 05/26/2018    Performed by Marcell Barlow, MD at [facility] CATH LAB   ? CORONARY STENT PLACEMENT  05/2018    x2   ? PACEMAKER INSERTION  06/2018   ? Aortic Root replacement (Freestyle)/Implantation of coronary artery buttons, Circ arrest N/A 07/21/2018    Performed by Earlene Plater (Trip) L III, MD at [facility] CVOR   ? INSERTION/ REPLACEMENT PERMANENT PACEMAKER WITH ATRIAL AND VENTRICULAR LEAD Left 07/23/2018    Performed by Lorain Childes, MD at [facility] EP LAB   ? FLUOROSCOPY - CARDIAC 07/23/2018    Performed by Lorain Childes, MD at [facility] EP LAB   ? EXPLORATION POSTOPERATIVE WOUND - CHEST N/A 07/24/2018    Performed by Earlene Plater (Trip) Elbert Ewings III, MD at [facility] CVOR   ? Percutaneous Mitral Valve Repair with MitraClip/TEE N/A 03/21/2020    Performed by Arby Barrette, MD at [facility] CVOR   ? COLONOSCOPY     ? HEART CATHETERIZATION  01/2016, 05/2018    Stent x 1 LAD placement following NSTEMI, 2 stents placed 04/2018   ? HX HEART CATHETERIZATION     ? SKIN CANCER EXCISION  2017, 2018    SCC Arm      Family history reviewed; non-contributory  Social History     Socioeconomic History   ? Marital status: Widowed     Spouse name: Not on file   ? Number of children: Not on file   ? Years of education: Not on file   ? Highest education level: Not on file   Occupational History   ? Not on file   Tobacco Use   ? Smoking status: Former Smoker     Packs/day: 0.50     Years: 15.00     Pack years: 7.50     Types: Cigarettes     Quit date: 11/20/1969     Years since quitting: 50.4   ? Smokeless tobacco: Never Used   Vaping Use   ? Vaping Use: Never used   Substance and Sexual Activity   ? Alcohol use: No   ? Drug use: Never   ? Sexual activity: Not on file   Other Topics Concern   ? Not on file   Social History Narrative   ? Not on file     Social Determinants of Health     Financial Resource Strain: Not on file   Food Insecurity: Not on file   Transportation Needs: Not on file   Physical Activity: Not on file   Stress: Not on file   Social Connections: Not on file   Intimate Partner Violence: Not on file   Housing Stability: Not on file      Vaping/E-liquid Use   ? Vaping Use Never  User                 Immunizations (includes history and patient reported):   Immunization History   Administered Date(s) Administered   ? COVID-19 (MODERNA), mRNA vacc, 100 mcg/0.5 mL (PF) 12/01/2019           Allergies:  Lisinopril    Medications:  No current facility-administered medications for this encounter.     Review of Systems:  Rest of 14 ROS is negative other than what is stated in HPI  Physical Exam:  Vital Signs: Last Filed In 24 Hours Vital Signs: 24 Hour Range   BP: 140/73 (04/06 1005)  Temp: 36.6 ?C (97.9 ?F) (04/06 1005)  Pulse: 87 (04/06 1015)  Respirations: 20 PER MINUTE (04/06 1015)  SpO2: 98 % (04/06 1015)  SpO2 Pulse: 82 (04/06 1015) BP: (140-143)/(73-89)   Temp:  [36.6 ?C (97.9 ?F)]   Pulse:  [65-87]   Respirations:  [14 PER MINUTE-20 PER MINUTE]   SpO2:  [98 %-99 %]           General:  Alert, cooperative, no distress, appears stated age  Head:  Normocephalic, without obvious abnormality, atraumatic  Eyes:  Conjunctivae/corneas clear.  PERRL, EOMs intact.  Fundi benign  Throat:  Lips, mucosa and tongue normal.  Teeth and gums normal  Neck:  Supple, symmetrical, trachea midline, no adenopathy, thyroid: no enlargement/tenderness/nodules, no carotid bruit and no JVD  Lungs:  Clear to auscultation bilaterally  Heart:    Regular rate and rhythm, S1, S2 normal, no murmur, click rub or gallop  Abdomen:  Soft, non-tender.  Bowel sounds normal.  No masses.  No organomegaly.  Extremities:  Extremities normal, atraumatic, no cyanosis or edema  Peripheral pulses:   2+ and symmetric, all extremities  Skin:   Skin color, texture, turgor normal.  No rashes or lesions    Lab/Radiology/Other Diagnostic Tests:  24-hour labs:  No results found for this visit on 05/02/20 (from the past 24 hour(s)).     Pertinent radiology reviewed.    Brentlee Sciara, DO  Pager [phone]

## 2020-05-02 NOTE — Other
Immediate Post Procedure Note    Date:  05/02/2020                                         Attending Physician:   Tina Griffiths  Performing Provider:  Sandrea Hammond, MD    Consent:  Consent obtained from patient.  Time out performed: Consent obtained, correct patient verified, correct procedure verified, correct site verified, patient marked as necessary.  Pre/Post Procedure Diagnosis:  AKI  Indications:  same      Procedure(s):  US guided left kidney bx  Findings:  Thin echogenic cortex     Estimated Blood Loss:  None/Negligible  Specimen(s) Removed/Disposition:  Yes, sent to pathology  Complications: None  Patient Tolerated Procedure: Well  Post-Procedure Condition:  stable    Sandrea Hammond, MD  Pager [phone]

## 2020-05-03 ENCOUNTER — Encounter: Admit: 2020-05-03 | Discharge: 2020-05-03 | Payer: MEDICARE

## 2020-05-03 ENCOUNTER — Observation Stay: Admit: 2020-05-03 | Discharge: 2020-05-03 | Payer: MEDICARE

## 2020-05-03 DIAGNOSIS — C4490 Unspecified malignant neoplasm of skin, unspecified: Secondary | ICD-10-CM

## 2020-05-03 DIAGNOSIS — I10 Essential (primary) hypertension: Secondary | ICD-10-CM

## 2020-05-03 DIAGNOSIS — G4733 Obstructive sleep apnea (adult) (pediatric): Secondary | ICD-10-CM

## 2020-05-03 DIAGNOSIS — C44621 Squamous cell carcinoma of skin of unspecified upper limb, including shoulder: Secondary | ICD-10-CM

## 2020-05-03 DIAGNOSIS — M549 Dorsalgia, unspecified: Secondary | ICD-10-CM

## 2020-05-03 DIAGNOSIS — N179 Acute kidney failure, unspecified: Secondary | ICD-10-CM

## 2020-05-03 DIAGNOSIS — I4891 Unspecified atrial fibrillation: Secondary | ICD-10-CM

## 2020-05-03 DIAGNOSIS — R011 Cardiac murmur, unspecified: Secondary | ICD-10-CM

## 2020-05-03 DIAGNOSIS — E785 Hyperlipidemia, unspecified: Secondary | ICD-10-CM

## 2020-05-03 DIAGNOSIS — I214 Non-ST elevation (NSTEMI) myocardial infarction: Secondary | ICD-10-CM

## 2020-05-03 DIAGNOSIS — I724 Aneurysm of artery of lower extremity: Secondary | ICD-10-CM

## 2020-05-03 DIAGNOSIS — E079 Disorder of thyroid, unspecified: Secondary | ICD-10-CM

## 2020-05-03 DIAGNOSIS — I2510 Atherosclerotic heart disease of native coronary artery without angina pectoris: Secondary | ICD-10-CM

## 2020-05-03 DIAGNOSIS — I350 Nonrheumatic aortic (valve) stenosis: Secondary | ICD-10-CM

## 2020-05-03 MED ORDER — LIDOCAINE (PF) 20 MG/ML (2 %) IJ SOLN
INTRAVENOUS | 0 refills | Status: DC
Start: 2020-05-03 — End: 2020-05-03
  Administered 2020-05-03: 21:00:00 60 mg via INTRAVENOUS

## 2020-05-03 MED ORDER — SODIUM CHLORIDE 0.9 % IV SOLP
INTRAVENOUS | 0 refills | Status: DC
Start: 2020-05-03 — End: 2020-05-03
  Administered 2020-05-03: 21:00:00 via INTRAVENOUS

## 2020-05-03 MED ORDER — PROPOFOL 10 MG/ML IV EMUL 20 ML (INFUSION)(AM)(OR)
INTRAVENOUS | 0 refills | Status: DC
Start: 2020-05-03 — End: 2020-05-03
  Administered 2020-05-03: 21:00:00 100 ug/kg/min via INTRAVENOUS

## 2020-05-03 NOTE — Anesthesia Pre-Procedure Evaluation
Anesthesia Pre-Procedure Evaluation    Name: Joel Hernandez      MRN: [id number]     DOB: 1940-05-27     Age: 80 y.o.     Sex: male   _________________________________________________________________________     Procedure Info:   Procedure Information     Date/Time: 05/03/20 1600    Scheduled providers: Charlene Brooke, Herbert Seta, DO; Janace Hoard, RN; Thomasena Edis Albertina Senegal, MD    Procedure: IR NATIVE RENAL BIOPSY    Location: Interventional Radiology: Main Campus, [facility]          Physical Assessment  Vital Signs (last filed in past 24 hours):  BP: 147/82 (04/07 1545)  Temp: 36.7 ?C (98.1 ?F) (04/07 1545)  Pulse: 64 (04/07 1545)  Respirations: 20 PER MINUTE (04/07 1545)  SpO2: 100 % (04/07 1545)  Height: 182.9 cm (6') (04/07 0900)  Weight: 93 kg (205 lb) (04/07 0900)      Patient History   Allergies   Allergen Reactions   ? Lisinopril SEE COMMENTS     Reports respiratory distress and back pain        Current Medications    Medication Directions   acetaminophen (TYLENOL) 325 mg tablet Take two tablets by mouth every 6 hours as needed.   amLODIPine (NORVASC) 5 mg tablet Take 5 mg by mouth daily.   apixaban (ELIQUIS) 2.5 mg tablet Take one tablet by mouth twice daily.   atorvastatin (LIPITOR) 40 mg tablet Take one tablet by mouth daily.   finasteride (PROSCAR) 5 mg tablet TAKE 1 TABLET BY MOUTH EVERY DAY   methIMAzole (TAPAZOLE) 10 mg tablet Take 5 mg by mouth daily.   metoprolol tartrate (LOPRESSOR) 50 mg tablet Take one tablet by mouth twice daily. Hold for HR < 55 and/or SBP<110   nitroglycerin (NITROSTAT) 0.4 mg tablet as Needed.   sildenafiL (VIAGRA) 100 mg tablet Take one tablet by mouth as Needed for Erectile dysfunction.   tamsulosin (FLOMAX) 0.4 mg capsule Take 1 capsule by mouth daily.         Review of Systems/Medical History        PONV Screening: Non-smoker and Postoperative opioids  No history of anesthetic complications  No family history of anesthetic complications      Airway - negative        Pulmonary       Not a current smoker (8 PYH; quit 1970s)        Sleep apnea (lost 70 lbs; no longer wears CPAP)      Cardiovascular       Recent diagnostic studies:          ECG, echocardiogram and stress test          Echo:   ? The left ventricular systolic function is normal. The ejection fraction by Simpson's biplane method is 50%  ? The right ventricle is mildly dilated. The right ventricular systolic function is normal.  ? MitraClip X 1 with mild residual regurgitation present.  ? Bioprosthetic aortic valve present- gradient not assessed.  ? Moderate tricuspid valve regurgitation present.          Exercise tolerance: <4 METS       Beta Blocker therapy: Yes      Beta blockers within 24 hours: No        CIED (June 2020)  : pacemaker              Medtronic  Device Indication(s):   AV block              pacemaker dependent                         CIED interrogated last 12 months              Device settings:  DDDR              Battery life > 1 year              Magnet response: Asynchronous mode and 85 bpm                  Hypertension, well controlled        Valvular problems/murmurs (AVR 2020, MR s/p mitraclip): MR        Past MI (NSTEMI. 2017), > 6 months      Coronary artery disease (CAD status post PCI(PCI to mid left circumflex, OM 2, middle right PDA)      PTCA (LAD stent 2017; 2 bare metal stents 2020)        Dysrhythmias; atrial fibrillation      CHF      Hyperlipidemia      Dyspnea on exertion      07/21/2018: S/P AVR (aortic valve replacement) and aortoplasty; Aortic Root Replacement with circulatory arrest          Pulmonary hypertension (PA on ECHO 01/2020)      GI/Hepatic/Renal          Renal disease (Recent increase in Creat to 3.5; follows with nephrology): CRI and ARF       Neuro/Psych - negative        Musculoskeletal         Back pain (lumbar lami x 3)      Endocrine/Other       No diabetes      Hyperthyroidism (tapazole ongoing)      History of blood transfusion (no known reaction)      Malignancy (Skin CA, squamous, 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    Abdominal Findings:       Obese    Neurological Findings:       Alert and oriented x 3    Constitutional findings:       No acute distress      Well-developed       Diagnostic Tests  Hematology:   Lab Results   Component Value Date    HGB 11.3 05/03/2020    HCT 33.3 05/03/2020    PLTCT 137 05/03/2020    WBC 8.6 05/03/2020    NEUT 77 05/03/2020    ANC 6.64 05/03/2020    ALC 1.02 05/03/2020    MONA 9 05/03/2020    AMC 0.77 05/03/2020    EOSA 1 05/03/2020    ABC 0.06 05/03/2020    MCV 96.3 05/03/2020    MCH 32.6 05/03/2020  MCHC 33.8 05/03/2020    MPV 9.8 05/03/2020    RDW 13.8 05/03/2020         General Chemistry:   Lab Results   Component Value Date    NA 141 05/03/2020    K 5.1 05/03/2020    CL 112 05/03/2020    CO2 18 05/03/2020    GAP 11 05/03/2020    BUN 62 05/03/2020    CR 3.92 05/03/2020    GLU 87 05/03/2020    CA 8.8 05/03/2020    ALBUMIN 4.2 04/16/2020    LACTIC 2.6 07/25/2018    OBSCA 1.63 07/25/2018    MG 2.0 03/22/2020    TOTBILI 0.5 04/16/2020    PO4 4.3 04/16/2020      Coagulation:   Lab Results   Component Value Date    PT 15.3 04/25/2020    PTT 30.4 03/21/2020    INR 1.4 04/25/2020     Carotid US 2020  Interpretation Summary    1. Moderate stenosis [50-79%] noted in right proximal internal carotid artery with moderate atheromatous plaque and turbulent flow. Based on end-diastolic velocity and ratio criteria stenosis is likely less than 70%.   2. No other hemodynamically significant (>50%) stenosis measured in the bilateral common carotid and left internal carotid arteries. Mild atheromatous plaque visualized in left internal carotid artery.  3. There is normal antegrade flow in bilateral vertebral arteries  4. No evidence of proximal subclavian stenosis bilaterally  5. Bilaterally enlarged thyroid gland noted.  Dedicated thyroid ultrasound is recommended if clinically indicated.  ?  ECHO 02/10/2020  Interpretation Summary    Normal left ventricular contractility.  Biplane LVEF 62%.  Severe concentric left ventricular hypertrophy.  Moderate right ventricular dilatation with probable normal right ventricular contractility.    Severe left atrial and mild right atrial dilatation.   Device lead seen in the right atrium and right ventricle.  Markedly elevated central venous pressure.  Nonspecific mitral valve leaflet thickening and calcified mitral chordae and mitral annulus.  Monophasic mitral diastolic flow.  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  Normal tricuspid valve motion.  Moderate tricuspid regurgitation.  Estimated PA pressure 46 mmHg.   25 mm Medtronic Freestyle bioprosthetic aortic valve appears well-seated. ?Bioprosthetic atrial valve cusp motion is not well visualized.  Peak aortic flow velocity 1.4 m/s, mean aortic gradient 5 mmHg, peak gradient 7 mmHg, aortic velocity ratio 0.79, and calculated effective orifice area 2.24 cm?Marland Kitchen ?No aortic regurgitation.  Pulmonic valve in the pulmonary artery not well visualized.  Trace pulmonic regurgitation.  Normal aortic root and proximal ascending aortic dimensions.  The transverse aorta is not visualized.  No pericardial effusion.    EKG v-pacing    Anesthesia Plan    ASA score: 4   Plan: MAC  Induction method: intravenous  NPO status: acceptable      Informed Consent  Anesthetic plan and risks discussed with patient.  Use of blood products discussed with patient  Blood Consent: consented      Plan discussed with: anesthesiologist, CRNA and SRNA.

## 2020-05-03 NOTE — Anesthesia Post-Procedure Evaluation
Post-Anesthesia Evaluation    Name: Joel Hernandez      MRN: [id number]     DOB: 09/24/1940     Age: 80 y.o.     Sex: male   __________________________________________________________________________     Procedure Information     Anesthesia Start Date/Time: 05/03/20 1601    Scheduled providers: Renella Cunas, RN; Lenna Gilford, MD    Procedure: CT GUIDE NEEDLE PLACEMENT    Location: Interventional Radiology: [place]  BP: 140/81 (04/07 1730)  Pulse: 60 (04/07 1730)  Respirations: 5 PER MINUTE (04/07 1730)  SpO2: 98 % (04/07 1730)  SpO2 Pulse: 60 (04/07 1730)  O2 Device: None (Room air) (04/07 1730)   Vitals Value Taken Time   BP 140/81 05/03/20 1730   Temp 36.7 C (98.1 F) 05/03/20 1645   Pulse 60 05/03/20 1730   Respirations 5 PER MINUTE 05/03/20 1730   SpO2 98 % 05/03/20 1730   O2 Device None (Room air) 05/03/20 1730   ABP     ART BP           Post Anesthesia Evaluation Note    Evaluation location: Pre/Post  Patient participation: recovered; patient participated in evaluation  Level of consciousness: alert    Pain score: 4  Pain management: adequate    Hydration: normovolemia  Temperature: 36.0C - 38.4C  Airway patency: adequate    Perioperative Events       Post-op nausea and vomiting: no PONV    Postoperative Status  Cardiovascular status: hemodynamically stable (paced rhythm)  Respiratory status: spontaneous ventilation  Follow-up needed: none        Perioperative Events  There were no known complications for this encounter.

## 2020-05-04 ENCOUNTER — Observation Stay: Admit: 2020-05-04 | Discharge: 2020-05-04 | Payer: MEDICARE

## 2020-05-04 MED ADMIN — FENTANYL CITRATE (PF) 50 MCG/ML IJ SOLN [3037]: 12.5 ug | INTRAVENOUS | @ 02:00:00 | Stop: 2020-05-04 | NDC 00641602701

## 2020-05-04 MED ADMIN — OXYCODONE 5 MG PO TAB [10814]: 5 mg | ORAL | @ 04:00:00 | NDC 00406055223

## 2020-05-04 MED ADMIN — ACETAMINOPHEN 500 MG PO TAB [102]: 1000 mg | ORAL | @ 04:00:00 | NDC 00904673061

## 2020-05-04 MED ADMIN — OXYCODONE 5 MG PO TAB [10814]: 5 mg | ORAL | @ 13:00:00 | NDC 00406055223

## 2020-05-05 ENCOUNTER — Encounter: Admit: 2020-05-05 | Discharge: 2020-05-05 | Payer: MEDICARE

## 2020-05-05 ENCOUNTER — Inpatient Hospital Stay: Admit: 2020-05-05 | Discharge: 2020-05-05 | Payer: MEDICARE

## 2020-05-05 MED ADMIN — SODIUM BICARBONATE 650 MG PO TAB [7313]: 650 mg | ORAL | @ 14:00:00 | Stop: 2020-05-05 | NDC 66553000801

## 2020-05-06 ENCOUNTER — Encounter: Admit: 2020-05-06 | Discharge: 2020-05-06 | Payer: MEDICARE

## 2020-05-06 MED ADMIN — SODIUM BICARBONATE 650 MG PO TAB [7313]: 1300 mg | ORAL | @ 13:00:00 | Stop: 2020-05-06 | NDC 66553000801

## 2020-05-06 MED ADMIN — SODIUM BICARBONATE 650 MG PO TAB [7313]: 1300 mg | ORAL | @ 01:00:00 | NDC 66553000801

## 2020-05-06 MED ADMIN — SODIUM ZIRCONIUM CYCLOSILICATE 10 GRAM PO PWPK [337243]: 10 g | ORAL | @ 15:00:00 | Stop: 2020-05-06 | NDC 00310111001

## 2020-05-08 ENCOUNTER — Encounter: Admit: 2020-05-08 | Discharge: 2020-05-08 | Payer: MEDICARE

## 2020-05-08 DIAGNOSIS — Z950 Presence of cardiac pacemaker: Secondary | ICD-10-CM

## 2020-05-08 DIAGNOSIS — I4821 Permanent atrial fibrillation: Secondary | ICD-10-CM

## 2020-05-10 ENCOUNTER — Encounter: Admit: 2020-05-10 | Discharge: 2020-05-10 | Payer: MEDICARE

## 2020-05-10 DIAGNOSIS — S37019D Minor contusion of unspecified kidney, subsequent encounter: Secondary | ICD-10-CM

## 2020-05-10 DIAGNOSIS — I4821 Permanent atrial fibrillation: Secondary | ICD-10-CM

## 2020-05-10 NOTE — Telephone Encounter
Discussed and scheduled appointment with Dr. Steve Rattler on 5/13 with patient. Discussed repeat ct for renal hematoma and provided scheduling telephone number for him to schedule. Patient will call back with any further questions or concerns.

## 2020-05-10 NOTE — Telephone Encounter
-----   Message from Katina Degree, RN sent at 05/07/2020  8:51 AM CDT -----    ----- Message -----  From: Vertell Novak, MD  Sent: 05/06/2020   2:57 PM CDT  To: Cvm Nurse [place]    Dear Joel Hernandez is a patient that I saw in cardiology consult in the hospital on 05/06/2020.  Please refer to my consult.  He needs an appointment with one of the electrophysiologist that comes to [facility] to discuss watchman device placement.  He will be off anticoagulation for now.  He also needs a repeat CT scan of the abdomen and pelvis to follow-up on the extent of the left renal subcapsular hematoma.  Please help to schedule these procedures.Thank you

## 2020-06-06 ENCOUNTER — Encounter: Admit: 2020-06-06 | Discharge: 2020-06-06 | Payer: MEDICARE

## 2020-06-07 ENCOUNTER — Encounter: Admit: 2020-06-07 | Discharge: 2020-06-07 | Payer: MEDICARE

## 2020-06-07 ENCOUNTER — Ambulatory Visit: Admit: 2020-06-07 | Discharge: 2020-06-07 | Payer: MEDICARE

## 2020-06-07 DIAGNOSIS — N179 Acute kidney failure, unspecified: Secondary | ICD-10-CM

## 2020-06-07 DIAGNOSIS — I10 Essential (primary) hypertension: Secondary | ICD-10-CM

## 2020-06-07 DIAGNOSIS — Z9889 Other specified postprocedural states: Secondary | ICD-10-CM

## 2020-06-07 DIAGNOSIS — S37012D Minor contusion of left kidney, subsequent encounter: Secondary | ICD-10-CM

## 2020-06-07 DIAGNOSIS — C44621 Squamous cell carcinoma of skin of unspecified upper limb, including shoulder: Secondary | ICD-10-CM

## 2020-06-07 DIAGNOSIS — N184 Chronic kidney disease, stage 4 (severe): Secondary | ICD-10-CM

## 2020-06-07 DIAGNOSIS — I724 Aneurysm of artery of lower extremity: Secondary | ICD-10-CM

## 2020-06-07 DIAGNOSIS — M549 Dorsalgia, unspecified: Secondary | ICD-10-CM

## 2020-06-07 DIAGNOSIS — I214 Non-ST elevation (NSTEMI) myocardial infarction: Secondary | ICD-10-CM

## 2020-06-07 DIAGNOSIS — I4891 Unspecified atrial fibrillation: Secondary | ICD-10-CM

## 2020-06-07 DIAGNOSIS — I4821 Permanent atrial fibrillation: Secondary | ICD-10-CM

## 2020-06-07 DIAGNOSIS — Z952 Presence of prosthetic heart valve: Secondary | ICD-10-CM

## 2020-06-07 DIAGNOSIS — I2510 Atherosclerotic heart disease of native coronary artery without angina pectoris: Secondary | ICD-10-CM

## 2020-06-07 DIAGNOSIS — Z950 Presence of cardiac pacemaker: Secondary | ICD-10-CM

## 2020-06-07 DIAGNOSIS — R011 Cardiac murmur, unspecified: Secondary | ICD-10-CM

## 2020-06-07 DIAGNOSIS — E785 Hyperlipidemia, unspecified: Secondary | ICD-10-CM

## 2020-06-07 DIAGNOSIS — I350 Nonrheumatic aortic (valve) stenosis: Secondary | ICD-10-CM

## 2020-06-07 DIAGNOSIS — C4490 Unspecified malignant neoplasm of skin, unspecified: Secondary | ICD-10-CM

## 2020-06-07 DIAGNOSIS — E079 Disorder of thyroid, unspecified: Secondary | ICD-10-CM

## 2020-06-07 DIAGNOSIS — G4733 Obstructive sleep apnea (adult) (pediatric): Secondary | ICD-10-CM

## 2020-06-07 NOTE — Assessment & Plan Note
Echocardiography has demonstrated a normal bioprosthetic aortic valve.

## 2020-06-07 NOTE — Assessment & Plan Note
His follow-up echocardiogram confirmed resolution of severe mitral regurgitation.

## 2020-06-07 NOTE — Assessment & Plan Note
The goal for his blood pressure is an average of 140/80 or less.

## 2020-06-07 NOTE — Assessment & Plan Note
He had CT imaging yesterday in [place] showing persistence of a fairly large left kidney hematoma measuring 7.3 X 5.7 X 8.5 cm.

## 2020-06-07 NOTE — Assessment & Plan Note
He had fairly sudden onset renal dysfunction late last year.  The result of his kidney biopsy has been nonspecific.  Recently his serum creatinine level has shown slight improvement.

## 2020-06-07 NOTE — Progress Notes
Date of Service: 06/07/2020    Joel Hernandez is a 80 y.o. male.       HPI     Joel Hernandez was in the [place] clinic today for follow-up regarding valvular disease, atrial fibrillation, and pacemaker.  He has been through a lot since I last saw him a few months ago.  He had a MitraClip procedure in late February, partly in the hopes that this would help with his sudden onset renal dysfunction.  It really has not and he went into [place] for a kidney biopsy last month.  The findings were fairly nonspecific and he was left with a pretty large hematoma on the left kidney afterwards.    Dr. Radford Pax saw him in consultation while he was hospitalized for the kidney biopsy.  She had recommended the possibility of a left atrial appendage occlusion device Joel Hernandez is scheduled to see one of the EP physicians tomorrow.  She also ordered a CT abdomen that was done yesterday and continues to demonstrate evidence of a fairly large hematoma on the left kidney.    Through all this Joel Hernandez says that he actually feels a little bit better over the past few days.  He has not had any trouble with exertional breathlessness or peripheral edema related to fluid retention.  Overall he still feels a pretty strong sense of fatigue as his main symptom.  He sleeps reasonably well although he gets up every 2 or 3 hours to urinate.  He is not having any angina nor is he having problems with palpitations, syncope, or near syncope.         Vitals:    06/07/20 1527 06/07/20 1538   BP: 138/68 (!) 140/68   BP Source: Arm, Left Upper Arm, Right Upper   Pulse: 78    SpO2: 98%    O2 Device: None (Room air)    PainSc: Zero    Weight: 94.6 kg (208 lb 9.6 oz)    Height: 182.9 cm (6')      Body mass index is 28.29 kg/m?Marland Kitchen     Past Medical History  Patient Active Problem List    Diagnosis Date Noted   ? Hematoma of left kidney 06/07/2020   ? CKD (chronic kidney disease) 05/02/2020   ? S/P mitral valve clip implantation 03/21/2020     03/21/20: Dr. Helen Hashimoto   04/25/2020 - ECHO: The left ventricular systolic function is normal. The ejection fraction by Simpson's biplane method is 50%  The right ventricle is mildly dilated. The right ventricular systolic function is normal.  MitraClip X 1 with mild residual regurgitation present.  Bioprosthetic aortic valve present- gradient not assessed.  Moderate tricuspid valve regurgitation present.       ? CKD (chronic kidney disease) stage 4, GFR 15-29 ml/min (HCC) 03/21/2020   ? Chronic diastolic heart failure, NYHA class 3 (HCC) 02/28/2020   ? ATN (acute tubular necrosis) (HCC) 02/11/2020   ? Hypotension 02/11/2020     Due to drug        ? Nonrheumatic mitral valve regurgitation 02/11/2020   ? AKI (acute kidney injury) (HCC) 02/09/2020   ? 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 Unplanned return to operating room with repeat sternotomy for cardiac tamponade 07/29/2018     07/24/18 - Redo sternotomy  with evacuation of hematoma     ? 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     ? S/p bare metal coronary artery stent 07/06/2018     x2 05/2018     ? RBBB 06/10/2018   ? Hypokalemia 06/10/2018   ? Hypomagnesemia 06/10/2018   ? 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     04/2018-found incidentally on CTA but confirmed by groin duplex     ? Foot drop 12/04/2016   ? HLD (hyperlipidemia) 12/04/2016   ? 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 Coronary artery disease due to calcified coronary lesion 01/19/2016     12/2015 - NSTEMI, LAD stent     ? Permanent atrial fibrillation (HCC) 01/18/2016     01/2016 - AF noted during hospitalization for unstable angina.  Attempt at DC cardioversion unsuccessful.  DC'ed on amiodarone & Eliquis.    01/2016 - Successful outpatient cardioversion at [facility].  Amiodarone continued    02/2016 - Office follow-up--back in AF, asymptomatic with good rate control.     ? Hypertension 09/15/2013     BP medication started in mid-30's     ? Obstructive sleep apnea 09/15/2013     Circa 2004 - Diagnosis established, CPAP started (Dr. Mayford Knife, pulmonologist in [facility])           Review of Systems   Constitutional: Positive for malaise/fatigue.   HENT: Positive for nosebleeds.    Eyes: Negative.    Cardiovascular: Negative.    Respiratory: Positive for cough.    Endocrine: Positive for cold intolerance.   Hematologic/Lymphatic: Bruises/bleeds easily.   Skin: Positive for dry skin.   Musculoskeletal: Positive for back pain, falls and muscle cramps.   Gastrointestinal: Negative.    Genitourinary: Positive for decreased libido and nocturia.   Neurological: Positive for weakness.   Psychiatric/Behavioral: Negative.    Allergic/Immunologic: Negative.        Physical Exam    Physical Exam   General Appearance: no distress   Skin: warm, no ulcers or xanthomas   Digits and Nails: no cyanosis or clubbing   Eyes: conjunctivae and lids normal, pupils are equal and round   Teeth/Gums/Palate: dentition unremarkable, no lesions   Lips & Oral Mucosa: no pallor or cyanosis   Neck Veins: normal JVP , neck veins are not distended   Thyroid: no nodules, masses, tenderness or enlargement   Chest Inspection: chest is normal in appearance   Respiratory Effort: breathing comfortably, no respiratory distress   Auscultation/Percussion: lungs clear to auscultation, no rales or rhonchi, no wheezing   PMI: PMI not enlarged or displaced   Cardiac Rhythm: regular rhythm and normal rate   Cardiac Auscultation: S1, S2 normal, no rub, no gallop   Murmurs: no murmur   Peripheral Circulation: normal peripheral circulation   Carotid Arteries: normal carotid upstroke bilaterally, no bruits   Radial Arteries: normal symmetric radial pulses   Abdominal Aorta: no abdominal aortic bruit   Pedal Pulses: normal symmetric pedal pulses   Lower Extremity Edema: no lower extremity edema   Abdominal Exam: soft, non-tender, no masses, bowel sounds normal   Liver & Spleen: no organomegaly   Gait & Station: walks without assistance   Muscle Strength: normal muscle tone   Orientation: oriented to time, place and person   Affect & Mood: appropriate and sustained  affect   Language and Memory: patient responsive and seems to comprehend information   Neurologic Exam: neurological assessment grossly intact   Other: moves all extremities      Cardiovascular Health Factors  Vitals BP Readings from Last 3 Encounters:   06/07/20 (!) 140/68   05/06/20 (!) 141/67   04/25/20 136/77     Wt Readings from Last 3 Encounters:   06/07/20 94.6 kg (208 lb 9.6 oz)   05/03/20 93 kg (205 lb)   04/25/20 98 kg (216 lb)     BMI Readings from Last 3 Encounters:   06/07/20 28.29 kg/m?   05/03/20 27.80 kg/m?   04/25/20 30.99 kg/m?      Smoking Social History     Tobacco Use   Smoking Status Former Smoker   ? Packs/day: 0.50   ? Years: 15.00   ? Pack years: 7.50   ? Types: Cigarettes   ? Quit date: 11/20/1969   ? Years since quitting: 50.5   Smokeless Tobacco Never Used      Lipid Profile Cholesterol   Date Value Ref Range Status   02/21/2020 108  Final     HDL   Date Value Ref Range Status   02/21/2020 39 (L) >=40 Final     LDL   Date Value Ref Range Status   02/21/2020 57  Final     Triglycerides   Date Value Ref Range Status   02/21/2020 62  Final      Blood Sugar Hemoglobin A1C   Date Value Ref Range Status   03/20/2020 5.4 4.0 - 6.0 % Final     Comment:     The ADA recommends that most patients with type 1 and type 2 diabetes maintain   an A1c level <7%.       Glucose   Date Value Ref Range Status   05/06/2020 81 70 - 100 MG/DL Final   16/10/9602 94 70 - 100 MG/DL Final   54/09/8117 147 (H) 70 - 100 MG/DL Final     Glucose Fasting Date Value Ref Range Status   05/29/2020 102  Final     Glucose, POC   Date Value Ref Range Status   08/03/2018 92 70 - 100 MG/DL Final   82/95/6213 96 70 - 100 MG/DL Final   08/65/7846 962 (H) 70 - 100 MG/DL Final          Problems Addressed Today  Encounter Diagnoses   Name Primary?   ? S/P mitral valve clip implantation    ? S/P AVR (aortic valve replacement) and aortoplasty    ? Permanent atrial fibrillation (HCC)    ? Primary hypertension    ? Coronary artery disease due to calcified coronary lesion    ? Presence of permanent cardiac pacemaker    ? CKD (chronic kidney disease) stage 4, GFR 15-29 ml/min (HCC)    ? Hematoma of left kidney, subsequent encounter        Assessment and Plan       S/P mitral valve clip implantation  His follow-up echocardiogram confirmed resolution of severe mitral regurgitation.    S/P AVR (aortic valve replacement) and aortoplasty  Echocardiography has demonstrated a normal bioprosthetic aortic valve.    Permanent atrial fibrillation (HCC)  He would very much like to eliminate oral anticoagulation from his medical program and is scheduled to see one of the EP physicians tomorrow for consideration of a left atrial appendage occlusion device.    Hypertension  The goal  for his blood pressure is an average of 140/80 or less.    Coronary artery disease due to calcified coronary lesion  No angina symptoms.  He had a myocardial perfusion study in January showing EF 53% and a small fixed apical defect.  Low risk study.    Presence of permanent cardiac pacemaker  There was an incidental finding of left subclavian vein stenosis in relation to the pacing lead.    CKD (chronic kidney disease) stage 4, GFR 15-29 ml/min (HCC)  He had fairly sudden onset renal dysfunction late last year.  The result of his kidney biopsy has been nonspecific.  Recently his serum creatinine level has shown slight improvement.    Hematoma of left kidney  He had CT imaging yesterday in [place] showing persistence of a fairly large left kidney hematoma measuring 7.3 X 5.7 X 8.5 cm.      Current Medications (including today's revisions)  ? acetaminophen (TYLENOL) 325 mg tablet Take two tablets by mouth every 6 hours as needed.   ? amLODIPine (NORVASC) 5 mg tablet Take 5 mg by mouth daily.   ? apixaban (ELIQUIS) 2.5 mg tablet Take one tablet by mouth twice daily.   ? atorvastatin (LIPITOR) 40 mg tablet Take one tablet by mouth daily.   ? cholecalciferol (vitamin D3) (VITAMIN D3) 1,000 units tablet Take 1,000 Units by mouth daily.   ? finasteride (PROSCAR) 5 mg tablet TAKE 1 TABLET BY MOUTH EVERY DAY   ? methIMAzole (TAPAZOLE) 10 mg tablet Take 5 mg by mouth daily.   ? metoprolol tartrate (LOPRESSOR) 50 mg tablet Take one tablet by mouth twice daily. Hold for HR < 55 and/or SBP<110   ? nitroglycerin (NITROSTAT) 0.4 mg tablet as Needed.   ? sildenafiL (VIAGRA) 100 mg tablet Take one tablet by mouth as Needed for Erectile dysfunction.   ? tamsulosin (FLOMAX) 0.4 mg capsule Take 1 capsule by mouth daily.     Total time spent on today's office visit was 45 minutes.  This includes face-to-face in person visit with patient as well as nonface-to-face time including review of the EMR, outside records, labs, radiologic studies, echocardiogram & other cardiovascular studies, formation of treatment plan, after visit summary, future disposition, and lastly on documentation.

## 2020-06-07 NOTE — Assessment & Plan Note
There was an incidental finding of left subclavian vein stenosis in relation to the pacing lead.

## 2020-06-07 NOTE — Assessment & Plan Note
He would very much like to eliminate oral anticoagulation from his medical program and is scheduled to see one of the EP physicians tomorrow for consideration of a left atrial appendage occlusion device.

## 2020-06-08 ENCOUNTER — Encounter: Admit: 2020-06-08 | Discharge: 2020-06-08 | Payer: MEDICARE

## 2020-06-08 DIAGNOSIS — I4891 Unspecified atrial fibrillation: Secondary | ICD-10-CM

## 2020-06-08 DIAGNOSIS — R011 Cardiac murmur, unspecified: Secondary | ICD-10-CM

## 2020-06-08 DIAGNOSIS — I350 Nonrheumatic aortic (valve) stenosis: Secondary | ICD-10-CM

## 2020-06-08 DIAGNOSIS — G4733 Obstructive sleep apnea (adult) (pediatric): Secondary | ICD-10-CM

## 2020-06-08 DIAGNOSIS — I10 Essential (primary) hypertension: Secondary | ICD-10-CM

## 2020-06-08 DIAGNOSIS — E079 Disorder of thyroid, unspecified: Secondary | ICD-10-CM

## 2020-06-08 DIAGNOSIS — N179 Acute kidney failure, unspecified: Secondary | ICD-10-CM

## 2020-06-08 DIAGNOSIS — C4490 Unspecified malignant neoplasm of skin, unspecified: Secondary | ICD-10-CM

## 2020-06-08 DIAGNOSIS — M549 Dorsalgia, unspecified: Secondary | ICD-10-CM

## 2020-06-08 DIAGNOSIS — I4821 Permanent atrial fibrillation: Secondary | ICD-10-CM

## 2020-06-08 DIAGNOSIS — I724 Aneurysm of artery of lower extremity: Secondary | ICD-10-CM

## 2020-06-08 DIAGNOSIS — I2510 Atherosclerotic heart disease of native coronary artery without angina pectoris: Secondary | ICD-10-CM

## 2020-06-08 DIAGNOSIS — I214 Non-ST elevation (NSTEMI) myocardial infarction: Secondary | ICD-10-CM

## 2020-06-08 DIAGNOSIS — E785 Hyperlipidemia, unspecified: Secondary | ICD-10-CM

## 2020-06-08 DIAGNOSIS — C44621 Squamous cell carcinoma of skin of unspecified upper limb, including shoulder: Secondary | ICD-10-CM

## 2020-06-26 ENCOUNTER — Encounter: Admit: 2020-06-26 | Discharge: 2020-06-26 | Payer: MEDICARE

## 2020-06-26 ENCOUNTER — Inpatient Hospital Stay: Admit: 2020-06-26 | Discharge: 2020-06-26 | Payer: MEDICARE

## 2020-06-26 DIAGNOSIS — I4821 Permanent atrial fibrillation: Secondary | ICD-10-CM

## 2020-06-26 MED ORDER — ASPIRIN 325 MG PO TAB
325 mg | Freq: Once | ORAL | 0 refills | Status: AC
Start: 2020-06-26 — End: ?

## 2020-06-26 MED ORDER — SODIUM CHLORIDE 0.9 % IV SOLP
INTRAVENOUS | 0 refills
Start: 2020-06-26 — End: ?

## 2020-06-26 MED ORDER — LIDOCAINE HCL 2 % MM JELP
Freq: Once | TOPICAL | 0 refills
Start: 2020-06-26 — End: ?

## 2020-06-26 MED ORDER — LIDOCAINE (PF) 10 MG/ML (1 %) IJ SOLN
.2 mL | INTRAMUSCULAR | 0 refills | PRN
Start: 2020-06-26 — End: ?

## 2020-06-26 MED ORDER — CEFAZOLIN INJ 1GM IVP
2 g | Freq: Once | INTRAVENOUS | 0 refills
Start: 2020-06-26 — End: ?

## 2020-06-26 NOTE — Patient Instructions
ELECTROPHYSIOLOGY PRE-ADMISSION INSTRUCTIONS    Patient Name: Joel Hernandez  MRN#: [id number]  Date of Birth: 02/08/1940 (80 y.o.)  Today's Date: 06/26/2020    PROCEDURE:  You are scheduled for Left Atrial Appendage Closure Device Placement (Watchman) with Dr. Annamarie Dawley.      ARRIVAL TIME:  Please report to the [facility] admitting office on the ground floor of the [facility] on: 11/08/2020  The EP Lab will call to notify you of your arrival time.  They will call on the business day prior to your procedure.  (If you have any questions regarding your arrival time for the Electrophysiology Lab, please call the EP Lab at [phone].)    PRE-PROCEDURE APPOINTMENTS:  TBD You will receive a call from Dr. Orpah Cobb nurse to schedule an ultrasound and to advise when they want you to restart your apixaban (Eliquis).     10/26/2020 at 2:00 pm   Office visit to update history and physical (requirement within 30 days of procedure) with Levonne Hubert, APRN at [facility]   10/26/2020 at 3:00 pm Pre-Operative Assessment Clinic visit with Anesthesia Department.  Go to the main hospital entrance of the [facility] The anesthesia clinic is just inside the entrance on the left side of lobby -  across from the Information Desk.     10/26/2020 Pre-Admission lab work: BMP, CBC and Magnesium at your Pre-Operative Assessment Visit.         SPECIAL MEDICATION INSTRUCTIONS  Nothing to eat or drink after midnight before your procedure.  Take your prescription medications with a sip of water as instructed.  No caffeine for 24 hours prior to your procedure.     Any new prescriptions will be sent to your pharmacy listed on file with [place].   HOLD ALL over the counter vitamins or supplements on the morning of your procedure.  Anticoagulants: Do not take apixaban (Eliquis) on the morning of your procedure. --- DO NOT MISS ANY OTHER DOSES ---         Additional Instructions  If you wear CPAP, please bring your mask and machine with you to the hospital.    Take a bath or shower with anti-bacterial soap the evening before, or the morning of the procedure. We will give this to you at your office visit.     Bring photo ID and your health insurance card(s).    Arrange for a driver to take you home from the hospital.    Bring an accurate list of your current medications with you to the hospital (all meds and supplements taken daily).    Wear comfortable clothes and don't bring valuables, other than photo identification card, with you to the hospital.    Please pack a bag for an overnight stay.     Please review your pre-procedure instructions and call the office at [phone] with any questions. You may ask to speak with any of Dr. Rodena Medin Noheria's nurses. There are several of [place] in the office that can assist you. For questions regarding post procedure care or restrictions please refer to the Your Care Instructions included in the  Left Atrial Appendage Closure Device Placement (Watchman) Packet.     ALLERGIES  Allergies   Allergen Reactions   ? Lisinopril SEE COMMENTS     Reports respiratory distress and back pain       IMPORTANT INFORMATION:    VISITORS:  Per the current visitor policy for the Cardiovascular  Labs, patients are allowed 1 visitor only in the pre/post rooms and no visitors are allowed to stay overnight with the patient.  There are several hotels near the hospital if you need to arrange for accommodations for your family or friend providing support and transportation home after your procedure.     FOLLOW UP APPTS:  There are several follow up appointments throughout the first year after implantation of this device.  I will contact you to schedule throughout the year.    45 day post procedure Office Visit and TEE (transesophageal echocardiogram)  We want to ensure there are no clots or leakage around the implant before anticoagulation is discontinued and Plavix is started.   6 months post procedure Office Visit  Most patients are able to stop the Plavix at this point and be on aspirin only for life.   1 year post procedure Office Visit and TEE (transesophageal echocardiogram)  This is our first chance to see what the implant looks like with no anticoagulation and no plavix.  We want to ensure there are no clots forming at the implant site.     Also, please remember:    You MUST take antibiotics prior to any dental procedures (including cleaning), invasive respiratory tract procedures and invasive skin procedures for 6 months after your device placement.  This will help to prevent infection to the device.  Contact your primary care provider or cardiologist for an antibiotic prescription to be taken prior to any of the above listed procedures.  _________________________________________  Form completed by: Tempie Hoist, BSN  Date completed: 06/26/20  Method: Via telephone and mailed to the patient.     Please let me know if you have any questions or concerns.    Thanks so much,  Tempie Hoist, BSN, RN, [facility]- Nurse Navigator   [facility] [employer] [facility] [place] [facility]  [facility]  Phone: [phone]- [email or web address]  [address] [place], [place], [place] [address]

## 2020-06-26 NOTE — Telephone Encounter
Called patient to schedule Watchman implant with Dr. Steve Rattler  Patient agreed to date of service, 11/08/2020  Pre procedure appointments discussed.  Patient verbalized understanding of dates/times/locations of all appointments.  Pre procedure instructions sent via mail per request.

## 2020-07-09 ENCOUNTER — Encounter: Admit: 2020-07-09 | Discharge: 2020-07-09 | Payer: MEDICARE

## 2020-07-09 NOTE — Telephone Encounter
Attempted to walk patient through sending in a manual remote transmission. Patient was unable to get transmission sent over after multiple attempts. I went ahead and scheduled a transmission to come over 07/09/20. Patient also has a wireless device. Anything that meets alert criteria would be sent overnight tonight. Will flag remote pool to follow up. Patient was agreeable with this plan and declined any further questions.

## 2020-07-09 NOTE — Telephone Encounter
Dr. Hannah Beat called from [facility] to report that Joel Hernandez's heart rate dipped down into the 40's either just before his Cardiac Rehab session or early in exercise.  He wasn't symptomatic, but they terminated the session.  He's back home now.    The low rate is set at 60 for his device.  I'll see if the nurses in [place] can do a remote transmission to determine whether his device is functioning normally.      He's due back at Cardiac Rehab Wednesday morning.

## 2020-07-12 ENCOUNTER — Encounter: Admit: 2020-07-12 | Discharge: 2020-07-12 | Payer: MEDICARE

## 2020-07-12 DIAGNOSIS — N184 Chronic kidney disease, stage 4 (severe): Secondary | ICD-10-CM

## 2020-07-16 ENCOUNTER — Encounter: Admit: 2020-07-16 | Discharge: 2020-07-16 | Payer: MEDICARE

## 2020-07-16 ENCOUNTER — Ambulatory Visit: Admit: 2020-07-16 | Discharge: 2020-07-17 | Payer: MEDICARE

## 2020-07-16 DIAGNOSIS — G4733 Obstructive sleep apnea (adult) (pediatric): Secondary | ICD-10-CM

## 2020-07-16 DIAGNOSIS — R011 Cardiac murmur, unspecified: Secondary | ICD-10-CM

## 2020-07-16 DIAGNOSIS — I10 Essential (primary) hypertension: Secondary | ICD-10-CM

## 2020-07-16 DIAGNOSIS — I4891 Unspecified atrial fibrillation: Secondary | ICD-10-CM

## 2020-07-16 DIAGNOSIS — I350 Nonrheumatic aortic (valve) stenosis: Secondary | ICD-10-CM

## 2020-07-16 DIAGNOSIS — E785 Hyperlipidemia, unspecified: Secondary | ICD-10-CM

## 2020-07-16 DIAGNOSIS — M549 Dorsalgia, unspecified: Secondary | ICD-10-CM

## 2020-07-16 DIAGNOSIS — N184 Chronic kidney disease, stage 4 (severe): Principal | ICD-10-CM

## 2020-07-16 DIAGNOSIS — I724 Aneurysm of artery of lower extremity: Secondary | ICD-10-CM

## 2020-07-16 DIAGNOSIS — C4490 Unspecified malignant neoplasm of skin, unspecified: Secondary | ICD-10-CM

## 2020-07-16 DIAGNOSIS — N179 Acute kidney failure, unspecified: Secondary | ICD-10-CM

## 2020-07-16 DIAGNOSIS — C44621 Squamous cell carcinoma of skin of unspecified upper limb, including shoulder: Secondary | ICD-10-CM

## 2020-07-16 DIAGNOSIS — I214 Non-ST elevation (NSTEMI) myocardial infarction: Secondary | ICD-10-CM

## 2020-07-16 DIAGNOSIS — I2510 Atherosclerotic heart disease of native coronary artery without angina pectoris: Secondary | ICD-10-CM

## 2020-07-16 DIAGNOSIS — E079 Disorder of thyroid, unspecified: Secondary | ICD-10-CM

## 2020-07-16 NOTE — Patient Instructions
Take baking soda 1 teaspoon per day.  You can dissolve it in a little liquid and drink it.

## 2020-07-17 DIAGNOSIS — D649 Anemia, unspecified: Secondary | ICD-10-CM

## 2020-07-25 ENCOUNTER — Encounter: Admit: 2020-07-25 | Discharge: 2020-07-25 | Payer: MEDICARE

## 2020-08-06 ENCOUNTER — Encounter: Admit: 2020-08-06 | Discharge: 2020-08-06 | Payer: MEDICARE

## 2020-08-06 DIAGNOSIS — N184 Chronic kidney disease, stage 4 (severe): Secondary | ICD-10-CM

## 2020-08-06 DIAGNOSIS — D649 Anemia, unspecified: Secondary | ICD-10-CM

## 2020-08-10 ENCOUNTER — Encounter: Admit: 2020-08-10 | Discharge: 2020-08-10 | Payer: MEDICARE

## 2020-09-07 ENCOUNTER — Encounter: Admit: 2020-09-07 | Discharge: 2020-09-07 | Payer: MEDICARE

## 2020-09-18 ENCOUNTER — Encounter: Admit: 2020-09-18 | Discharge: 2020-09-18 | Payer: MEDICARE

## 2020-09-18 DIAGNOSIS — R011 Cardiac murmur, unspecified: Secondary | ICD-10-CM

## 2020-09-18 DIAGNOSIS — G4733 Obstructive sleep apnea (adult) (pediatric): Secondary | ICD-10-CM

## 2020-09-18 DIAGNOSIS — I724 Aneurysm of artery of lower extremity: Secondary | ICD-10-CM

## 2020-09-18 DIAGNOSIS — I10 Essential (primary) hypertension: Secondary | ICD-10-CM

## 2020-09-18 DIAGNOSIS — E7800 Pure hypercholesterolemia, unspecified: Secondary | ICD-10-CM

## 2020-09-18 DIAGNOSIS — E785 Hyperlipidemia, unspecified: Secondary | ICD-10-CM

## 2020-09-18 DIAGNOSIS — C44621 Squamous cell carcinoma of skin of unspecified upper limb, including shoulder: Secondary | ICD-10-CM

## 2020-09-18 DIAGNOSIS — N179 Acute kidney failure, unspecified: Secondary | ICD-10-CM

## 2020-09-18 DIAGNOSIS — I4891 Unspecified atrial fibrillation: Secondary | ICD-10-CM

## 2020-09-18 DIAGNOSIS — I4821 Permanent atrial fibrillation: Secondary | ICD-10-CM

## 2020-09-18 DIAGNOSIS — I214 Non-ST elevation (NSTEMI) myocardial infarction: Secondary | ICD-10-CM

## 2020-09-18 DIAGNOSIS — I2510 Atherosclerotic heart disease of native coronary artery without angina pectoris: Secondary | ICD-10-CM

## 2020-09-18 DIAGNOSIS — I350 Nonrheumatic aortic (valve) stenosis: Secondary | ICD-10-CM

## 2020-09-18 DIAGNOSIS — E079 Disorder of thyroid, unspecified: Secondary | ICD-10-CM

## 2020-09-18 DIAGNOSIS — Z952 Presence of prosthetic heart valve: Secondary | ICD-10-CM

## 2020-09-18 DIAGNOSIS — C4490 Unspecified malignant neoplasm of skin, unspecified: Secondary | ICD-10-CM

## 2020-09-18 DIAGNOSIS — M549 Dorsalgia, unspecified: Secondary | ICD-10-CM

## 2020-09-18 NOTE — Progress Notes
Date of Service: 09/18/2020    Joel Hernandez is a 80 y.o. male.       HPI     Joel Hernandez was in the [facility] clinic today.  He is discouraged about the progressive decline in renal function.  He says that he is not going to go on dialysis but that he would consider a transplant if his age does not preclude it.    He is not really having significant problems with fluid retention at this point.  He is got some fatigue and low energy.  From a cardiovascular standpoint he seems to be doing fine.  He is not having angina nor is he having problems with breathlessness.  He is unaware of his atrial fibrillation.  He has had a little bit of epistaxis with the oral anticoagulation and when he had an upper respiratory infection a couple of weeks ago he had some scant hemoptysis at times.    He denies any TIA or stroke symptoms.  He has had no claudication.         Vitals:    09/18/20 1309   BP: 118/70   BP Source: Arm, Left Upper   Pulse: 72   SpO2: 98%   O2 Percent: 98 %   O2 Device: None (Room air)   PainSc: One   Weight: 94.3 kg (208 lb)   Height: 182.9 cm (6')     Body mass index is 28.21 kg/m?Marland Kitchen     Past Medical History  Patient Active Problem List    Diagnosis Date Noted   ? Hematoma of left kidney 06/07/2020   ? CKD (chronic kidney disease) 05/02/2020   ? S/P mitral valve clip implantation 03/21/2020     03/21/20: Dr. Helen Hashimoto   04/25/2020 - ECHO:  The left ventricular systolic function is normal. The ejection fraction by Simpson's biplane method is 50%  The right ventricle is mildly dilated. The right ventricular systolic function is normal.  MitraClip X 1 with mild residual regurgitation present.  Bioprosthetic aortic valve present- gradient not assessed.  Moderate tricuspid valve regurgitation present.       ? CKD (chronic kidney disease) stage 4, GFR 15-29 ml/min (HCC) 03/21/2020   ? Chronic diastolic heart failure, NYHA class 3 (HCC) 02/28/2020   ? ATN (acute tubular necrosis) (HCC) 02/11/2020   ? Hypotension 02/11/2020 Due to drug        ? Nonrheumatic mitral valve regurgitation 02/11/2020   ? AKI (acute kidney injury) (HCC) 02/09/2020   ? 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 Unplanned return to operating room with repeat sternotomy for cardiac tamponade 07/29/2018     07/24/18 - Redo sternotomy with evacuation of hematoma     ? 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     ? S/p bare metal coronary artery stent 07/06/2018     x2 05/2018     ? RBBB 06/10/2018   ? Hypokalemia 06/10/2018   ? Hypomagnesemia 06/10/2018   ? 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     04/2018-found incidentally on CTA but confirmed by groin duplex     ? Foot drop 12/04/2016   ? HLD (hyperlipidemia) 12/04/2016   ? Spinal stenosis  at L4-L5 level 12/04/2016   ? 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 Coronary artery disease due to calcified coronary lesion 01/19/2016     12/2015 - NSTEMI, LAD stent     ? Permanent atrial fibrillation (HCC) 01/18/2016     01/2016 - AF noted during hospitalization for unstable angina.  Attempt at DC cardioversion unsuccessful.  DC'ed on amiodarone & Eliquis.    01/2016 - Successful outpatient cardioversion at [facility].  Amiodarone continued    02/2016 - Office follow-up--back in AF, asymptomatic with good rate control.     ? Hypertension 09/15/2013     BP medication started in mid-30's     ? Obstructive sleep apnea 09/15/2013     Circa 2004 - Diagnosis established, CPAP started (Dr. Mayford Knife, pulmonologist in [facility])           Review of Systems   Constitutional: Positive for malaise/fatigue.   HENT: Negative.    Eyes: Negative.    Cardiovascular: Positive for dyspnea on exertion.   Respiratory: Negative.    Endocrine: Negative.    Hematologic/Lymphatic: Negative.    Skin: Negative.    Musculoskeletal: Positive for back pain, muscle cramps and muscle weakness.   Gastrointestinal: Negative.    Genitourinary: Negative.    Neurological: Positive for weakness.   Psychiatric/Behavioral: Negative.    Allergic/Immunologic: Negative.        Physical Exam    Physical Exam   General Appearance: no distress   Skin: warm, no ulcers or xanthomas   Digits and Nails: no cyanosis or clubbing   Eyes: conjunctivae and lids normal, pupils are equal and round   Teeth/Gums/Palate: dentition unremarkable, no lesions   Lips & Oral Mucosa: no pallor or cyanosis   Neck Veins: normal JVP , neck veins are not distended   Thyroid: no nodules, masses, tenderness or enlargement   Chest Inspection: chest is normal in appearance   Respiratory Effort: breathing comfortably, no respiratory distress   Auscultation/Percussion: lungs clear to auscultation, no rales or rhonchi, no wheezing   PMI: PMI not enlarged or displaced   Cardiac Rhythm: regular rhythm and normal rate   Cardiac Auscultation: S1, S2 normal, no rub, no gallop   Murmurs: grade 2 systolic murmur   Peripheral Circulation: normal peripheral circulation   Carotid Arteries: normal carotid upstroke bilaterally, no bruits   Radial Arteries: normal symmetric radial pulses   Abdominal Aorta: no abdominal aortic bruit   Pedal Pulses: normal symmetric pedal pulses   Lower Extremity Edema: no lower extremity edema   Abdominal Exam: soft, non-tender, no masses, bowel sounds normal   Liver & Spleen: no organomegaly   Gait & Station: walks without assistance   Muscle Strength: normal muscle tone   Orientation: oriented to time, place and person   Affect & Mood: appropriate and sustained affect   Language and Memory: patient responsive and seems to comprehend information   Neurologic Exam: neurological assessment grossly intact   Other: moves all extremities        Cardiovascular Health Factors  Vitals BP Readings from Last 3 Encounters:   09/18/20 118/70   07/16/20 115/62   06/08/20 118/62     Wt Readings from Last 3 Encounters:   09/18/20 94.3 kg (208 lb)   07/16/20 93.4 kg (206 lb)   06/08/20 94.6 kg (208 lb 9.6 oz)     BMI Readings from Last 3 Encounters:   09/18/20 28.21 kg/m?   07/16/20 29.56 kg/m?  06/08/20 28.29 kg/m?      Smoking Social History     Tobacco Use   Smoking Status Former Smoker   ? Packs/day: 0.50   ? Years: 15.00   ? Pack years: 7.50   ? Types: Cigarettes   ? Quit date: 11/20/1969   ? Years since quitting: 50.8   Smokeless Tobacco Never Used      Lipid Profile Cholesterol   Date Value Ref Range Status   02/21/2020 108  Final     HDL   Date Value Ref Range Status   02/21/2020 39 (L) >=40 Final     LDL   Date Value Ref Range Status   02/21/2020 57  Final     Triglycerides   Date Value Ref Range Status   02/21/2020 62  Final      Blood Sugar Hemoglobin A1C   Date Value Ref Range Status   03/20/2020 5.4 4.0 - 6.0 % Final     Comment:     The ADA recommends that most patients with type 1 and type 2 diabetes maintain   an A1c level <7%.       Glucose   Date Value Ref Range Status   08/31/2020 114 (H) 70 - 105 Final   07/13/2020 89  Final   05/06/2020 81 70 - 100 MG/DL Final     Glucose Fasting   Date Value Ref Range Status   05/29/2020 102  Final     Glucose, POC   Date Value Ref Range Status   08/03/2018 92 70 - 100 MG/DL Final   45/40/9811 96 70 - 100 MG/DL Final   91/47/8295 621 (H) 70 - 100 MG/DL Final          Problems Addressed Today  Encounter Diagnoses   Name Primary?   ? Primary hypertension Yes   ? Permanent atrial fibrillation (HCC)    ? Coronary artery disease due to calcified coronary lesion    ? Pure hypercholesterolemia    ? S/P AVR (aortic valve replacement) and aortoplasty        Assessment and Plan     S/P mitral valve clip implantation  He's had a good result from this procedure  ?  S/P AVR (aortic valve replacement) and aortoplasty  Echocardiography has demonstrated a normal bioprosthetic aortic valve.  ?  Permanent atrial fibrillation (HCC)  He's decided not to pursue the Watchman implant.  He'll continue Eliquis.  ?  Hypertension  The goal for his blood pressure is an average of 140/80 or less.  ?  Coronary artery disease due to calcified coronary lesion  No angina symptoms.  He had a myocardial perfusion study in January showing EF 53% and a small fixed apical defect.  Low risk study.  ?  Presence of permanent cardiac pacemaker  There was an incidental finding of left subclavian vein stenosis in relation to the pacing lead.  ?  CKD (chronic kidney disease) stage 4, GFR 15-29 ml/min (HCC)  He had fairly sudden onset renal dysfunction late 2020.  The result of his kidney biopsy has been nonspecific.  He's seeing nephrologists at [facility] and [place].  He says that he won't do dialysis, but that he'd consider a transplant.  ?         Current Medications (including today's revisions)  ? acetaminophen (TYLENOL) 325 mg tablet Take two tablets by mouth every 6 hours as needed.   ? amLODIPine (NORVASC) 5 mg tablet Take 5 mg by mouth daily.   ?  apixaban (ELIQUIS) 2.5 mg tablet Take one tablet by mouth twice daily.   ? atorvastatin (LIPITOR) 40 mg tablet Take one tablet by mouth daily.   ? cholecalciferol (vitamin D3) (VITAMIN D3) 1,000 units tablet Take 1,000 Units by mouth daily.   ? ferrous sulfate (FEOSOL) 325 mg (65 mg iron) tablet Take 325 mg by mouth daily.   ? finasteride (PROSCAR) 5 mg tablet TAKE 1 TABLET BY MOUTH EVERY DAY   ? methIMAzole (TAPAZOLE) 10 mg tablet Take 5 mg by mouth daily.   ? metoprolol tartrate (LOPRESSOR) 50 mg tablet Take one tablet by mouth twice daily. Hold for HR < 55 and/or SBP<110   ? nitroglycerin (NITROSTAT) 0.4 mg tablet as Needed.   ? tadalafiL (CIALIS) 20 mg tablet Take 1 tablet by mouth as Needed.   ? tamsulosin (FLOMAX) 0.4 mg capsule Take 1 capsule by mouth daily.     Total time spent on today's office visit was 45 minutes.  This includes face-to-face in person visit with patient as well as nonface-to-face time including review of the EMR, outside records, labs, radiologic studies, echocardiogram & other cardiovascular studies, formation of treatment plan, after visit summary, future disposition, and lastly on documentation.

## 2020-09-27 ENCOUNTER — Encounter: Admit: 2020-09-27 | Discharge: 2020-09-27 | Payer: MEDICARE

## 2020-09-27 NOTE — Telephone Encounter
New Referral Intake    TO PATIENT:  Thank you for contacting us, how did  you hear about our transplant program? Dr. Farris Has    Is English your primary language? Yes   If no, do you or your support person need an interpreter?      (HARD STOPS)  *Do you have a support person who will be with you before, during, and after a transplant? Yes        Name and Relationship of Support Person: Son, Shaunn Mihok, able to drive, local to patient   (over 36 yoa, can/will drive & lives locally to you?)  *Would you accept a lifesaving blood transfusion? Yes (IF NO-stop!)  *Any active infections? No  (IF YES- stop!)  *Open wounds or sores? No  (IF YES- stop!)  *Do you live in a Nursing Facility? No (If YES, at what level? Independent Living, Assisted Living or Skilled/Rehab?)    *Have you had your COVID vaccination series? Yes What brand? Moderna  How many? Three shots    If YES, please send or bring a copy of your vacc card.  If NO, referral nurse will address     *Primary Insurance Company: [facility]  Policy #: [id number]  Group ID:     Secondary Insurance Company: Medicare  Policy #: [id number]  Group ID:     Who is your kidney doctor?  Dr. Farris Has and Dr. Rocco Serene  Who is your primary doctor?  Dr. Andreas Newport    Have you had a hospitalization in the past 6 months?  Yes, in spring had micro valve clip installed, at [facility] If YES, ask what hospital, what date and why?     Past Medical History:  (if answer is yes, please specify)    How tall are you?  6 0  How much do you weigh? 200lbs   Calculated BMI:  27.1    What is the cause of your kidney disease?  Losartan medication, per cardiologist/PCP   Have you ever had a kidney biopsy? Yes, two   If yes, where/when? At [facility]- this last spring and re-did it again the next day  Have you ever been diagnosed with Diabetes?  No   (If No, skip to dialysis)   What Type?    At what age were you diagnosed?  Do you see an Endocrinologist?     Are you currently on dialysis?  No (If No, skip the next 3 questions)   Type/ Start date (chronic)/ Schedule/ Time:     What center:               Do you receive [employer] ([employer]) premium assistance?   If yes, how much per month?                Have you had your Hepatitis B vaccination series?     Have you been denied or listed by another transplant center? No IF denied, why?   Are you currently working with any other transplant centers to become listed? No  Have you had a previous organ transplant? No  If YES, when and where?     History of:  Heart disease?  Yes  Do you have a heart doctor? Dr. Danella Maiers and surgeries done with Dr. Earlene Plater  History of heart stents?  Yes, three    Are you on medication to keep your blood thin (ex. Coumadin, Plavix, Brilinta- do not include Heparin for dialysis)? Eloquis    Are you  on medication to help raise your blood pressure (Midodrine)? No     Lung disease? No, If yes, do you see a Pulmonologist?    Are you on oxygen? No  If Yes, how many liters?  with dialysis or all the time?    Do you wear CPAP (breathing machine at night to help you sleep)? No               Do you use any marijuana products ? No                 Do you use illegal substances or have you in the past? No   Do you use tobacco or nicotine products or have you in the past? Used to smoke  (this includes vaping and patches)   Packs/cans per day? Less than a pack/day  for years? 12-13 years  When did you quit? 50 years ago   (TO PATIENT: If you currently use tobacco or nicotine products, you need to quit to be considered a transplant candidate.)                 Liver disease?  No                            If YES, what kind, when diagnosed?   History of HIV or Hepatitis? No    If yes, who treated and where?  Cancer? Skin cancer, [place], [place]- [facility], 1st one 5-6 years ago, 2nd one 3-4 years ago     If yes, what kind, when diagnosed, where treated?    Psychiatric illness (Depression/Anxiety/Bipolar Disorder)?  No    Do you see a psychiatrist or counselor for any reason?  No               Do you use a cane/walker/wheelchair to get around? No, carry a walking staff when hunting in woods    Past Surgical History:    Have you had any abdominal surgeries? No  Have you had any surgeries on blood vessels (not including a fistula)? No  Have you had any amputations?  No    Demographic and Social History:  (Please do not turn these into a leading questions, ask them as they are written)  What gender were you assigned at birth (male or male)?  male    Do you still identify as a_____? male  What race do you identify with (White, Black, Hispanic, Asian, Pacific Islander)? white      Has anyone shown interest in being your living donor?  (Not applicable with [facility]) not yet   If so, they are welcome to come to evaluation with you (up to two persons)  If patient has possible living donor - have them call:  ? [place] - [phone]  ? Lelon Mast [phone]      If not already in MyChart.Marland KitchenMarland KitchenWe may need you to sign a Release of Information Authorization Form. Do you have access to a fax machine or an e-mail address we can send it to? In chart    Note to staff -   Obtain the following records:  ? nephrology note  ? hospital discharge summary within 6 month (or most recent)   ? pathology report    Notify Dr. Curt Bears if delay in scheduling due to lack of records

## 2020-10-06 ENCOUNTER — Encounter: Admit: 2020-10-06 | Discharge: 2020-10-06 | Payer: MEDICARE

## 2020-10-23 ENCOUNTER — Encounter: Admit: 2020-10-23 | Discharge: 2020-10-23 | Payer: MEDICARE

## 2020-10-23 NOTE — Telephone Encounter
Called patient to confirm he wanted to cancell Watchman procedure.  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  Advised I would cancel procedure.

## 2020-10-24 ENCOUNTER — Encounter: Admit: 2020-10-24 | Discharge: 2020-10-24 | Payer: MEDICARE

## 2020-10-24 NOTE — Progress Notes
Reviewed referral records and in [place]. Will d/w Dr Abram Sander on Monday

## 2020-10-29 ENCOUNTER — Encounter: Admit: 2020-10-29 | Discharge: 2020-10-29 | Payer: MEDICARE

## 2020-10-29 NOTE — Telephone Encounter
Referral Review with: Dr Abram Sander   Coordinator reviewed all available testing/documentation   Determination: NEED MORE INFORMATION- review  05/05/20 CT in Committee, if ok talk to patient about the need for a LD d/t co-morbid conditions (heart disease/Advanced age); Will also need Cardiology Risk Stratification d/t +stress    Added to 10/31/20 Committee agenda

## 2020-10-31 ENCOUNTER — Encounter: Admit: 2020-10-31 | Discharge: 2020-10-31 | Payer: MEDICARE

## 2020-10-31 NOTE — Committee Review
Pre Transplant Discussion Note     Organ being evaluated for: Kidney    Transplant Coordinator: Evorn Gong  Transplant Surgeon:   listed below    Transplant Physician: listed below    Committee Review Members:  Dietician, Registered Deeann Cree, RD   Financial Counseling and Assistance Services [place], [place], Gearldine Shown   Nephrology Diane Kem Kays, MD, Theadora Rama, MD, Fabio Neighbors, MD, Wonda Cheng, MD, Thea Gist, APRN-NP   Pharmacist Clemencia Course, [place]   Psychiatry Edsel Petrin, MD   Social Worker, Clinical Lynetta Mare, Mollie Germany   Transplant Services Burtis Junes, RN, Sueanne Margarita, RN, Livingston Diones, RN, Orvis Brill, RN, Verneda Skill, RN, Lynnell Catalan, RN, Evorn Gong, RN   Transplant Surgery Leilani Able, MD, Almon Register, MD       Reason for Discussion: CT review    Discussion Summary: reviewed CT with surgeons and vessels were ok to come in for evaluation.    Decision/ Update Plan of Care:  Ok to come in for evaluation from a vascular point.    Additional Discussion Notes and Findings: None

## 2020-11-01 ENCOUNTER — Encounter: Admit: 2020-11-01 | Discharge: 2020-11-01 | Payer: MEDICARE

## 2020-11-01 NOTE — Telephone Encounter
TC to patient. Discussed Committee review of vessels- not a barrier. Medical Director strongly encouraged a Living Donor (LD) since Not on Dialysis (NOD), significant Heart disease and Advanced age d/t potential 3-5 year wait for deceased donor txp. Patient informed this RN that his 80 yo brother has offered and is on good health. He has also gone to a dialysis information class and is considering home HD/PD. Informed him d/t his advanced age his brother is most likely not a candidate for LD. Discussed the fact that he is also on ELIQUIS so he may have to consider the Watchman (per Dr Ricard Dillon note) or Coumadin if he is found to be a viable candidate. He said he is willing to undergo the Watchman procedure if he found to be a candidate for txp. We will also need Risk Stratification/Clearance from Dr Ricard Dillon d/t his significant Cardiac history, Advanced age and recent + stress test. We usually request a LHC for this. It has not been done most likely because he is NOD and/or was considered Low Risk. Informed him that we can give him resources to help find a LD at the time of evaluation. Moved to Financial. Once they approve him financially he will be contacted to schedule his evaluation. V/u     Needs letter ([id number]) e-faxed to PCP and sent to patient via [place].    Routed this note to Dr Ricard Dillon for review.

## 2020-11-07 ENCOUNTER — Encounter: Admit: 2020-11-07 | Discharge: 2020-11-06 | Payer: MEDICARE

## 2020-11-08 ENCOUNTER — Encounter: Admit: 2020-11-08 | Discharge: 2020-11-08 | Payer: MEDICARE

## 2020-11-08 NOTE — Telephone Encounter
TRANSPLANT BENEFIT COLLECTION:    Verified by: Gearldine Shown  Date: Oct. 13,2022  ID #: [id number] GR#: [id number]  Subscriber: self   Ins Plan: UHC Grp Medicare EFF:01/28/20 Phone#:[phone]  Plan Type: [employer] Ret[employer]e  Deductible: $100(met 2022)   Co-ins: n/a   Out of Pocket: $1000(met 2022)  Inpt Copay: $200/day, days 1-5  Outpt Copay: $100  OV PCP/Spec Copay: $15/ $25  Plan limitations/concerns:  is in network  Donor Benefits: covered  Travel/lodging-Recipient:/ Donor: covered  Spoke with: Diane N. (call ref# [id number])  HIC Number:[id number]    RX Plan:[employer] RX RXBIN: [id number]  Phone#:[phone]  30 day retail cost: $5(tier1)/ $15(tier2)/ $47(tier3)/ 40%(tier4)/ 25%(tier5)  90 day m/o cost: $0(tier1)/ $21(tier2)/ $120(tier3)/ 35%(tier4)/ 25%(tier5)  Co-pay Structure: Sub to Coverage Gap  Current Subsidy: NO Extra Help  Valcyte: Not covered  Generic: $47  Envarsus XR: $47  Spoke to : Ricky Stabs ref# [id number])  Coverage gap in 2022:  Deductible StageThis is the period when you pay for your prescription drugs in full (reduced by the plans low negotiated rates) until you meet your yearly deductible amount.   Stage 1 pt pays as plan requires until total paid out = $4,430  Stage 2 coverage gap, pt pays 25% of cost of generics, 25% of cost of brand names until total paid out = $7050(pt and Plan)  Patient responsibility in gap is approximately 50% of $2,330 = $1,165.  Stage 3 catastrophic pt will pay as plan requires (small copays or coinsurances)    TXP Network: optum medicare  NCM: PENDING  Phone #: [phone]   Auth requirements: Eval and listing auth required    PLEASE NOTIFY TFA 15 BUSINESS DAYS PRIOR TO EVAL APPT SO TFA CAN OBTAIN AUTHORIZATION FOR THE EVALUATION

## 2020-11-09 ENCOUNTER — Encounter: Admit: 2020-11-09 | Discharge: 2020-11-09 | Payer: MEDICARE

## 2020-11-09 DIAGNOSIS — Z01818 Encounter for other preprocedural examination: Secondary | ICD-10-CM

## 2020-11-27 ENCOUNTER — Encounter: Admit: 2020-11-27 | Discharge: 2020-11-27 | Payer: MEDICARE

## 2020-11-27 NOTE — Telephone Encounter
TFA placed TC to optum at phone# [phone] to start a case for a kidney txp eval auth. TFA spoke to [employer]. Who took all the patients pertinent information and will email it to the assigned NCM who will in return contact TFA within 15 business days with eval auth approval. Pending eval auth ref# is [id number] , case effective date will be 11/27/20

## 2020-11-30 ENCOUNTER — Encounter: Admit: 2020-11-30 | Discharge: 2020-11-30 | Payer: MEDICARE

## 2020-11-30 NOTE — Telephone Encounter
TRANSPLANT AUTHORIZATIONS    PLAN: [employer] AUTHORIZATION Phase 1 Evaluation  Auth #: [id number] eff: 11/27/20 - no expiration date as long as active w/ plan  NCM: Zane Herald  Phone #: [phone] ext: [phone]  Comments/request: update optum website if patient is closed or approved for listing    PLAN:[employer]UM AUTHORIZATION Phase 2 Listing & Procedure  Auth #:  [id number] eff: 11/27/20 - no expiration date as long as active w/ plan  NCM: Zane Herald  Phone #: [phone] ext: [phone]  Comments/request: update optum website if patient is closed or approved for listing      Transplant inpatient admission requires precert by calling [employer] Intake at [phone]    TFA was advised patient does have travel and lodging benefits

## 2020-12-03 ENCOUNTER — Ambulatory Visit: Admit: 2020-12-03 | Discharge: 2020-12-03 | Payer: MEDICARE

## 2020-12-03 ENCOUNTER — Encounter: Admit: 2020-12-03 | Discharge: 2020-12-03 | Payer: MEDICARE

## 2020-12-03 DIAGNOSIS — I2510 Atherosclerotic heart disease of native coronary artery without angina pectoris: Secondary | ICD-10-CM

## 2020-12-03 DIAGNOSIS — R011 Cardiac murmur, unspecified: Secondary | ICD-10-CM

## 2020-12-03 DIAGNOSIS — I724 Aneurysm of artery of lower extremity: Secondary | ICD-10-CM

## 2020-12-03 DIAGNOSIS — N186 End stage renal disease: Secondary | ICD-10-CM

## 2020-12-03 DIAGNOSIS — N179 Acute kidney failure, unspecified: Secondary | ICD-10-CM

## 2020-12-03 DIAGNOSIS — E079 Disorder of thyroid, unspecified: Secondary | ICD-10-CM

## 2020-12-03 DIAGNOSIS — C44621 Squamous cell carcinoma of skin of unspecified upper limb, including shoulder: Secondary | ICD-10-CM

## 2020-12-03 DIAGNOSIS — I214 Non-ST elevation (NSTEMI) myocardial infarction: Secondary | ICD-10-CM

## 2020-12-03 DIAGNOSIS — I10 Essential (primary) hypertension: Secondary | ICD-10-CM

## 2020-12-03 DIAGNOSIS — G4733 Obstructive sleep apnea (adult) (pediatric): Secondary | ICD-10-CM

## 2020-12-03 DIAGNOSIS — I4891 Unspecified atrial fibrillation: Secondary | ICD-10-CM

## 2020-12-03 DIAGNOSIS — C4490 Unspecified malignant neoplasm of skin, unspecified: Secondary | ICD-10-CM

## 2020-12-03 DIAGNOSIS — Z01818 Encounter for other preprocedural examination: Secondary | ICD-10-CM

## 2020-12-03 DIAGNOSIS — E785 Hyperlipidemia, unspecified: Secondary | ICD-10-CM

## 2020-12-03 DIAGNOSIS — I350 Nonrheumatic aortic (valve) stenosis: Secondary | ICD-10-CM

## 2020-12-03 DIAGNOSIS — M549 Dorsalgia, unspecified: Secondary | ICD-10-CM

## 2020-12-03 LAB — HEPATITIS C ANTIBODY W REFLEX HCV PCR QUANT

## 2020-12-03 LAB — TOXOPLASMA IGG: TOXOPLASMA IGG: NEGATIVE

## 2020-12-03 LAB — HEPATITIS B SURFACE AG

## 2020-12-03 LAB — PROTIME INR (PT)
INR: 1.1 (ref 0.8–1.2)
PROTIME: 12 s (ref 9.5–14.2)

## 2020-12-03 LAB — CBC
HEMATOCRIT: 35 % — ABNORMAL LOW (ref 40–50)
HEMOGLOBIN: 11 g/dL — ABNORMAL LOW (ref 13.5–16.5)
MCH: 31 pg (ref 26–34)
MCHC: 33 g/dL (ref 32.0–36.0)
MCV: 96 FL (ref 80–100)
MPV: 10 FL (ref 7–11)
RBC COUNT: 3.6 M/UL — ABNORMAL LOW (ref 4.4–5.5)
RDW: 13 % (ref 11–15)
WBC COUNT: 8.4 K/UL (ref 4.5–11.0)

## 2020-12-03 LAB — COMPREHENSIVE METABOLIC PANEL
ALBUMIN: 4.3 g/dL (ref 3.5–5.0)
ALK PHOSPHATASE: 108 U/L (ref 25–110)
AST: 17 U/L (ref 7–40)
BLD UREA NITROGEN: 84 mg/dL — ABNORMAL HIGH (ref 7–25)
CALCIUM: 9.6 mg/dL (ref 8.5–10.6)
CREATININE: 5.3 mg/dL — ABNORMAL HIGH (ref 0.4–1.24)
GLUCOSE,PANEL: 84 mg/dL (ref 70–100)
POTASSIUM: 5.2 MMOL/L — ABNORMAL HIGH (ref 3.5–5.1)
SODIUM: 138 MMOL/L (ref 137–147)
TOTAL BILIRUBIN: 0.7 mg/dL (ref 0.3–1.2)
TOTAL PROTEIN: 6.6 g/dL (ref 6.0–8.0)

## 2020-12-03 LAB — SYPHILIS AB SCREEN: SYPHILIS AB, TOTAL: NEGATIVE

## 2020-12-03 LAB — GGTP: GGTP: 29 U/L (ref 9–64)

## 2020-12-03 LAB — HEPATITIS B CORE AB TOT (IGG+IGM)

## 2020-12-03 LAB — PSA SCREEN: PSA SCREEN: 1.3 ng/mL (ref <6.01)

## 2020-12-03 LAB — BILIRUBIN, DIRECT: DIRECT BILIRUBIN: 0.2 mg/dL (ref <0.4)

## 2020-12-03 LAB — PHOSPHORUS: PHOSPHORUS: 4.9 mg/dL — ABNORMAL HIGH (ref 2.0–4.5)

## 2020-12-03 LAB — HIV 1& 2 AG-AB SCRN W REFLEX HIV 1 PCR QUANT

## 2020-12-03 LAB — HEPATITIS B SURFACE AB: HEP B SURFACE ABY: NEGATIVE

## 2020-12-03 NOTE — Progress Notes
Transplant Pharmacy Evaluation    I met with Joel Hernandez for their pharmacy transplant evaluation.    Patient Assessment    Current opioid/benzodiazepine use: no  Current antiplatelet/anticoagulant use: yes; Eliquis for afib    Medication Adherence Assessment  Method(s) used to manage medications: pill box and medication list    Key (0=never, 1=rarely, 2=sometimes, 3=often, 4=always)   How often do you forget to take one or more of your prescription medications?: 0  How often do you run out of one or more of your prescription medications before getting a new refill?: 0  How often do you have difficulty affording your prescription medications?: 0  How often does your pharmacy have difficulty filling your prescription medications?: 0  Assessment: Adherence is excellent.    Education  During this visit the patient was provided with an overview of therapies that will be initiated post-transplant including but not limited to significant side effects and dosing schedules. Emphasis was placed on the importance of medication adherence both pre- and post-transplant.     Assessment and Recommendations  I had the pleasure of meeting with Joel Hernandez for a transplant pharmacy evaluation. [name] demonstrates excellent medication adherence.  All questions were addressed at the time of our encounter. [name] expressed understanding and was encouraged to contact the pharmacist with follow-up questions.    Potential drug interactions: None  Potential challenges with transplantation: Eliquis    Recommendations: There are medication related challenges with transplantation. The following strategies are recommended to mitigate those challenges: An alternative to Eliquis is needed prior to transplant.    A medication history and reconciliation were performed (including prescription medications, supplements, over the counter, and herbal products). The medication list was updated, and the patient's current medication list is included below.    Home Medications    Medication Sig   acetaminophen (TYLENOL) 325 mg tablet Take two tablets by mouth every 6 hours as needed.   amLODIPine (NORVASC) 5 mg tablet Take 5 mg by mouth daily.   apixaban (ELIQUIS) 2.5 mg tablet Take one tablet by mouth twice daily.   atorvastatin (LIPITOR) 40 mg tablet Take one tablet by mouth daily.   cholecalciferol (vitamin D3) (VITAMIN D3) 1,000 units tablet Take 1,000 Units by mouth daily.   ferrous sulfate (FEOSOL) 325 mg (65 mg iron) tablet Take 325 mg by mouth daily.   finasteride (PROSCAR) 5 mg tablet TAKE 1 TABLET BY MOUTH EVERY DAY   methIMAzole (TAPAZOLE) 10 mg tablet Take 5 mg by mouth daily.   metoprolol tartrate (LOPRESSOR) 50 mg tablet Take one tablet by mouth twice daily. Hold for HR < 55 and/or SBP<110   nitroglycerin (NITROSTAT) 0.4 mg tablet as Needed.   tadalafiL (CIALIS) 20 mg tablet Take 1 tablet by mouth as Needed.   tamsulosin (FLOMAX) 0.4 mg capsule Take 1 capsule by mouth daily.       Lonzo Cloud, PHARMD

## 2020-12-04 ENCOUNTER — Encounter: Admit: 2020-12-04 | Discharge: 2020-12-04 | Payer: MEDICARE

## 2020-12-05 ENCOUNTER — Encounter: Admit: 2020-12-05 | Discharge: 2020-12-05 | Payer: MEDICARE

## 2020-12-05 NOTE — Committee Review
Committee Review Note     Evaluation Date: 12/03/2020  Committee Review Date: 12/05/2020    Organ being evaluated for: Kidney    Transplant Phase: Evaluation  Transplant Status:  Active    Transplant Coordinator: Ralph Dowdy  Transplant Surgeon:       Referring Physician: Bernardo Heater    Primary Diagnosis: Hypertensive Nephrosclerosis  Secondary Diagnosis:     Committee Review Members:  Dietician, Registered Lajuana Carry   Financial Counseling and Assistance Services [facility] [place], [place] Delilah Shan   Nephrology Diane Dortha Kern, MD, Seth Bake, MD, Corena Herter, MD, Dorna Mai, MD, Alessandra Bevels, APRN-NP   Pharmacist Lonzo Cloud, [place]   Psychiatry Vanita Panda, MD   Social Worker, Clinical [facility]   Transplant Services [place], RN, Francee Piccolo, RN, Velva Harman, RN, Ralph Dowdy, RN, Alger Simons, RN, Delbert Phenix, RN, Wylene Simmer, RN   Transplant Surgery York Cerise, MD, Liane Comber, MD, Nicholos Johns, MD, Joycelyn Man, MD       Transplant Eligibility: Adults age 28 or older.  A minor may be considered on an individual basis, End Stage Renal Disease as defined by CrCl or GFR of less than or equal to 20 mL/min; or currently on dialysis    Committee Review Decision: Declined    Relative Contraindications:     Absolute Contraindications: Significant co morbid conditions that include but are not limited to:  a.  Advanced Cardiovascular Disease  b.  Advanced Pulmonary Disease c. Advanced Peripheral Artery Disease     Committee Discussion Details:    80 yo NOD. Marginal to poor surg candidate, marginal medical candidate. No potential LDs. CKD due to extensive cardiac hx and HTN. Starting PD in 1 week, PD cath in place. Eliquis for valve replacement. Cardiac arrest during valve surgeries. Smoker for 13 yrs, quit in 30s. Remote ETOH, no abuse hx. No cancer hx. UTD on COVID-19 vacc. Phys exam acceptable, dentition fair, foot drop due to back surgeries but able to amb in hall adequately with provider. V paced EKG in clinic. Derm, cancer screenings updated, cardiology clearance. Surg: mutli co morbidities, on Eliquis, CT OK. SW: good support. Pharm: on Eliquis Dietician: no concerns TFA: no FAP, listing auth on file. Close eval due to high risk re: multiple co morbidities, age.        Action Items: None    Financial Plan Submitted?: Not applicable     Financial Plan Approved?: Not applicable    Is Financial Auth Needed?: Yes- (on file)    Does patient need to return to committee?: No Reason: Declined

## 2020-12-06 ENCOUNTER — Encounter: Admit: 2020-12-06 | Discharge: 2020-12-06 | Payer: MEDICARE

## 2020-12-06 NOTE — Telephone Encounter
TC to patient to discuss reasons for Committee denial. Multiple High Risk Co-Morbid Conditions: NOD (no wait time- no potential LDs); Extensive Cardiac history; on anti-platelet therapy Joel Hernandez) and Advanced Age. V/u- no questions.  Offered a list of other txp centers that consider high Risk patients- declined.    Denial letter drafted and e-faxed to Providers and via MyChart to patient.    Routed to Primary NC.

## 2021-01-03 ENCOUNTER — Encounter: Admit: 2021-01-03 | Discharge: 2021-01-03 | Payer: MEDICARE

## 2021-01-03 DIAGNOSIS — E7800 Pure hypercholesterolemia, unspecified: Secondary | ICD-10-CM

## 2021-01-03 DIAGNOSIS — I2510 Atherosclerotic heart disease of native coronary artery without angina pectoris: Secondary | ICD-10-CM

## 2021-01-03 DIAGNOSIS — Z9889 Other specified postprocedural states: Secondary | ICD-10-CM

## 2021-02-05 ENCOUNTER — Encounter: Admit: 2021-02-05 | Discharge: 2021-02-04 | Payer: MEDICARE

## 2021-03-01 ENCOUNTER — Encounter: Admit: 2021-03-01 | Discharge: 2021-03-01 | Payer: MEDICARE

## 2021-03-01 MED ORDER — ATORVASTATIN 40 MG PO TAB
ORAL_TABLET | Freq: Every day | 3 refills | Status: AC
Start: 2021-03-01 — End: ?

## 2021-03-15 ENCOUNTER — Encounter: Admit: 2021-03-15 | Discharge: 2021-03-15 | Payer: MEDICARE

## 2021-04-16 ENCOUNTER — Encounter: Admit: 2021-04-16 | Discharge: 2021-04-16 | Payer: MEDICARE

## 2021-04-16 ENCOUNTER — Ambulatory Visit: Admit: 2021-04-16 | Discharge: 2021-04-16 | Payer: MEDICARE

## 2021-04-16 DIAGNOSIS — I4821 Permanent atrial fibrillation: Secondary | ICD-10-CM

## 2021-04-16 DIAGNOSIS — I4510 Unspecified right bundle-branch block: Secondary | ICD-10-CM

## 2021-04-20 ENCOUNTER — Encounter: Admit: 2021-04-20 | Discharge: 2021-04-20 | Payer: MEDICARE

## 2021-04-30 ENCOUNTER — Encounter: Admit: 2021-04-30 | Discharge: 2021-04-30 | Payer: MEDICARE

## 2021-05-02 ENCOUNTER — Encounter: Admit: 2021-05-02 | Discharge: 2021-05-02 | Payer: MEDICARE

## 2021-05-02 DIAGNOSIS — C44621 Squamous cell carcinoma of skin of unspecified upper limb, including shoulder: Secondary | ICD-10-CM

## 2021-05-02 DIAGNOSIS — I214 Non-ST elevation (NSTEMI) myocardial infarction: Secondary | ICD-10-CM

## 2021-05-02 DIAGNOSIS — Z952 Presence of prosthetic heart valve: Secondary | ICD-10-CM

## 2021-05-02 DIAGNOSIS — I2510 Atherosclerotic heart disease of native coronary artery without angina pectoris: Secondary | ICD-10-CM

## 2021-05-02 DIAGNOSIS — I724 Aneurysm of artery of lower extremity: Secondary | ICD-10-CM

## 2021-05-02 DIAGNOSIS — I10 Essential (primary) hypertension: Secondary | ICD-10-CM

## 2021-05-02 DIAGNOSIS — R011 Cardiac murmur, unspecified: Secondary | ICD-10-CM

## 2021-05-02 DIAGNOSIS — E7800 Pure hypercholesterolemia, unspecified: Secondary | ICD-10-CM

## 2021-05-02 DIAGNOSIS — G4733 Obstructive sleep apnea (adult) (pediatric): Secondary | ICD-10-CM

## 2021-05-02 DIAGNOSIS — I350 Nonrheumatic aortic (valve) stenosis: Secondary | ICD-10-CM

## 2021-05-02 DIAGNOSIS — I4821 Permanent atrial fibrillation: Secondary | ICD-10-CM

## 2021-05-02 DIAGNOSIS — N186 End stage renal disease: Secondary | ICD-10-CM

## 2021-05-02 DIAGNOSIS — I5032 Chronic diastolic (congestive) heart failure: Secondary | ICD-10-CM

## 2021-05-02 DIAGNOSIS — E785 Hyperlipidemia, unspecified: Secondary | ICD-10-CM

## 2021-05-02 DIAGNOSIS — I4891 Unspecified atrial fibrillation: Secondary | ICD-10-CM

## 2021-05-02 DIAGNOSIS — C4490 Unspecified malignant neoplasm of skin, unspecified: Secondary | ICD-10-CM

## 2021-05-02 DIAGNOSIS — E079 Disorder of thyroid, unspecified: Secondary | ICD-10-CM

## 2021-05-02 DIAGNOSIS — N179 Acute kidney failure, unspecified: Secondary | ICD-10-CM

## 2021-05-02 DIAGNOSIS — Z9889 Other specified postprocedural states: Secondary | ICD-10-CM

## 2021-05-02 DIAGNOSIS — M549 Dorsalgia, unspecified: Secondary | ICD-10-CM

## 2021-05-02 NOTE — Progress Notes
Date of Service: 05/02/2021    Bowdy Peretti Nando Klopfenstein is a 81 y.o. male.       HPI     [name] was in [facility] today for follow-up regarding his complex cardiovascular disease.  He has coronary disease, bioprosthetic aortic valve, permanent atrial fibrillation, pacemaker, recent mitral valve clip.  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  He was turned down for renal transplant by the [facility] program.    He had MitraClip in February and since then says that he feels great.  He is having no trouble with peripheral edema or exertional breathlessness.  He denies any exertional chest discomfort.  He remains on Eliquis and has had no TIA or stroke symptoms.  He has had no bleeding complications.    He denies any syncope or near syncope.      He went [place] hunting yesterday and bagged a deer this past season.         Vitals:    05/02/21 1249   BP: 110/58   BP Source: Arm, Left Upper   Pulse: 50   SpO2: 98%   O2 Device: None (Room air)   PainSc: Zero   Weight: 97.1 kg (214 lb)   Height: 182.9 cm (6')     Body mass index is 29.02 kg/m?Marland Kitchen     Past Medical History  Patient Active Problem List    Diagnosis Date Noted   ? Hematoma of left kidney 06/07/2020   ? CKD (chronic kidney disease) 05/02/2020   ? S/P mitral valve clip implantation 03/21/2020     03/21/20: Dr. Helen Hashimoto   04/25/2020 - ECHO:  The left ventricular systolic function is normal. The ejection fraction by Simpson's biplane method is 50%  The right ventricle is mildly dilated. The right ventricular systolic function is normal.  MitraClip X 1 with mild residual regurgitation present.  Bioprosthetic aortic valve present- gradient not assessed.  Moderate tricuspid valve regurgitation present.       ? ESRD (end stage renal disease) (HCC) 03/21/2020   ? Chronic diastolic heart failure, NYHA class 3 (HCC) 02/28/2020   ? ATN (acute tubular necrosis) (HCC) 02/11/2020   ? Hypotension 02/11/2020     Due to drug        ? Nonrheumatic mitral valve regurgitation 02/11/2020   ? AKI (acute kidney injury) (HCC) 02/09/2020   ? 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 Unplanned return to operating room with repeat sternotomy for cardiac tamponade 07/29/2018     07/24/18 - Redo sternotomy with evacuation of hematoma     ? 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     ? S/p bare metal coronary artery stent 07/06/2018     x2 05/2018     ? RBBB 06/10/2018   ? Hypokalemia 06/10/2018   ? Hypomagnesemia 06/10/2018   ? 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     04/2018-found incidentally on CTA but confirmed by groin duplex     ? Foot drop 12/04/2016   ? HLD (hyperlipidemia) 12/04/2016   ? 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  Coronary artery disease due to calcified coronary lesion 01/19/2016     12/2015 - NSTEMI, LAD stent     ? Permanent atrial fibrillation (HCC) 01/18/2016     01/2016 - AF noted during hospitalization for unstable angina.  Attempt at DC cardioversion unsuccessful.  DC'ed on amiodarone & Eliquis.    01/2016 - Successful outpatient cardioversion at [facility].  Amiodarone continued    02/2016 - Office follow-up--back in AF, asymptomatic with good rate control.     ? Hypertension 09/15/2013     BP medication started in mid-30's     ? Obstructive sleep apnea 09/15/2013     Circa 2004 - Diagnosis established, CPAP started (Dr. Mayford Knife, pulmonologist in [facility])           Review of Systems   Constitutional: Negative.   HENT: Negative.    Eyes: Negative.    Cardiovascular: Negative.    Respiratory: Negative.    Endocrine: Negative. Hematologic/Lymphatic: Negative.    Skin: Negative.    Musculoskeletal: Negative.    Gastrointestinal: Negative.    Genitourinary: Negative.    Neurological: Negative.    Psychiatric/Behavioral: Negative.    Allergic/Immunologic: Negative.        Physical Exam    Physical Exam   General Appearance: no distress   Skin: warm, no ulcers or xanthomas   Digits and Nails: no cyanosis or clubbing   Eyes: conjunctivae and lids normal, pupils are equal and round   Teeth/Gums/Palate: dentition unremarkable, no lesions   Lips & Oral Mucosa: no pallor or cyanosis   Neck Veins: normal JVP , neck veins are not distended   Thyroid: no nodules, masses, tenderness or enlargement   Chest Inspection: chest is normal in appearance   Respiratory Effort: breathing comfortably, no respiratory distress   Auscultation/Percussion: lungs clear to auscultation, no rales or rhonchi, no wheezing   PMI: PMI not enlarged or displaced   Cardiac Rhythm: regular rhythm and normal rate   Cardiac Auscultation: S1, S2 normal, no rub, no gallop   Murmurs: grade 2 systolic murmur   Peripheral Circulation: normal peripheral circulation   Carotid Arteries: normal carotid upstroke bilaterally, no bruits   Radial Arteries: normal symmetric radial pulses   Abdominal Aorta: no abdominal aortic bruit   Pedal Pulses: normal symmetric pedal pulses   Lower Extremity Edema: no lower extremity edema   Abdominal Exam: soft, non-tender, no masses, bowel sounds normal   Liver & Spleen: no organomegaly   Gait & Station: walks without assistance   Muscle Strength: normal muscle tone   Orientation: oriented to time, place and person   Affect & Mood: appropriate and sustained affect   Language and Memory: patient responsive and seems to comprehend information   Neurologic Exam: neurological assessment grossly intact   Other: moves all extremities      Cardiovascular Health Factors  Vitals BP Readings from Last 3 Encounters:   05/02/21 110/58   12/03/20 114/63   12/03/20 114/63     Wt Readings from Last 3 Encounters:   05/02/21 97.1 kg (214 lb)   12/03/20 93 kg (205 lb)   12/03/20 93 kg (205 lb)     BMI Readings from Last 3 Encounters:   05/02/21 29.02 kg/m?   12/03/20 27.80 kg/m?   12/03/20 27.80 kg/m?      Smoking Social History     Tobacco Use   Smoking Status Former   ? Packs/day: 0.50   ? Years: 15.00   ? Pack years: 7.50   ?  Types: Cigarettes   ? Quit date: 11/20/1969   ? Years since quitting: 51.4   Smokeless Tobacco Never      Lipid Profile Cholesterol   Date Value Ref Range Status   02/21/2020 108  Final     HDL   Date Value Ref Range Status   02/21/2020 39 (L) >=40 Final     LDL   Date Value Ref Range Status   02/21/2020 57  Final     Triglycerides   Date Value Ref Range Status   02/21/2020 62  Final      Blood Sugar Hemoglobin A1C   Date Value Ref Range Status   03/20/2020 5.4 4.0 - 6.0 % Final     Comment:     The ADA recommends that most patients with type 1 and type 2 diabetes maintain   an A1c level <7%.       Glucose   Date Value Ref Range Status   12/12/2020 99  Final   12/03/2020 84 70 - 100 MG/DL Final   16/10/9602 540 (H) 70 - 105 Final     Glucose Fasting   Date Value Ref Range Status   05/29/2020 102  Final     Glucose, POC   Date Value Ref Range Status   08/03/2018 92 70 - 100 MG/DL Final   98/11/9145 96 70 - 100 MG/DL Final   82/95/6213 086 (H) 70 - 100 MG/DL Final          Problems Addressed Today  Encounter Diagnoses   Name Primary?   ? Chronic diastolic heart failure, NYHA class 3 (HCC)    ? ESRD (end stage renal disease) (HCC)    ? Permanent atrial fibrillation (HCC)    ? Pure hypercholesterolemia    ? Coronary artery disease due to calcified coronary lesion    ? Primary hypertension    ? S/P mitral valve clip implantation    ? S/P AVR (aortic valve replacement) and aortoplasty        Assessment and Plan       ESRD (end stage renal disease) (HCC)  He was turned down for renal transplant at [facility].  He follows with Dr. Farris Has at [facility].  He's been doing peritoneal dialysis for several months.    Permanent atrial fibrillation (HCC)  He's been getting his Eliquis from [place] and has some concern about the reliability of the drugs.  We talked briefly about Watchman and he's aware of this, but not ready for it at this point.  His PPM paces his ventricle 100%.    Chronic diastolic heart failure, NYHA class 3 (HCC)  He is doing pretty well with peritoneal dialysis.  He certainly does not appear to be volume overloaded currently.    HLD (hyperlipidemia)  Lab Results   Component Value Date    CHOL 108 02/21/2020    TRIG 62 02/21/2020    HDL 39 (L) 02/21/2020    LDL 57 02/21/2020    VLDL 12 02/21/2020    NONHDLCHOL 122 01/19/2016    CHOLHDLC 3 02/21/2020      LDL treated to goal.    Coronary artery disease due to calcified coronary lesion  We have done every other year surveillance stress testing.  His last stress test was in January, 2022, and showed fairly minor abnormalities.    Hypertension  Goal for his blood pressure is an average of 130/80 or less.    S/P mitral valve clip implantation  He had a  follow-up echocardiogram last week that showed an excellent result with only mild residual regurgitation.    S/P AVR (aortic valve replacement) and aortoplasty  He had an echocardiogram last week showing normal bioprosthetic aortic valve function.      Current Medications (including today's revisions)  ? acetaminophen (TYLENOL) 325 mg tablet Take two tablets by mouth every 6 hours as needed.   ? amLODIPine (NORVASC) 5 mg tablet Take one tablet by mouth daily.   ? apixaban (ELIQUIS) 2.5 mg tablet Take one tablet by mouth twice daily.   ? atorvastatin (LIPITOR) 40 mg tablet TAKE 1 TABLET BY MOUTH DAILY   ? cholecalciferol (vitamin D3) (VITAMIN D3) 1,000 units tablet Take one tablet by mouth daily.   ? ferrous sulfate (FEOSOL) 325 mg (65 mg iron) tablet Take one tablet by mouth daily.   ? finasteride (PROSCAR) 5 mg tablet TAKE 1 TABLET BY MOUTH EVERY DAY   ? methIMAzole (TAPAZOLE) 10 mg tablet Take one-half tablet by mouth daily.   ? metoprolol tartrate (LOPRESSOR) 50 mg tablet Take one tablet by mouth twice daily. Hold for HR < 55 and/or SBP<110   ? nitroglycerin (NITROSTAT) 0.4 mg tablet as Needed.   ? tadalafiL (CIALIS) 20 mg tablet Take one tablet by mouth as Needed.   ? tamsulosin (FLOMAX) 0.4 mg capsule Take one capsule by mouth daily.     Total time spent on today's office visit was 45 minutes.  This includes face-to-face in person visit with patient as well as nonface-to-face time including review of the EMR, outside records, labs, radiologic studies, echocardiogram & other cardiovascular studies, formation of treatment plan, after visit summary, future disposition, and lastly on documentation.

## 2021-05-02 NOTE — Assessment & Plan Note
We have done every other year surveillance stress testing.  His last stress test was in January, 2022, and showed fairly minor abnormalities.

## 2021-05-02 NOTE — Assessment & Plan Note
He's been getting his Eliquis from [place] and has some concern about the reliability of the drugs.  We talked briefly about Watchman and he's aware of this, but not ready for it at this point.  His PPM paces his ventricle 100%.

## 2021-05-02 NOTE — Assessment & Plan Note
He had an echocardiogram last week showing normal bioprosthetic aortic valve function.

## 2021-05-02 NOTE — Assessment & Plan Note
He had a follow-up echocardiogram last week that showed an excellent result with only mild residual regurgitation.

## 2021-05-02 NOTE — Assessment & Plan Note
Lab Results   Component Value Date    CHOL 108 02/21/2020    TRIG 62 02/21/2020    HDL 39 (L) 02/21/2020    LDL 57 02/21/2020    VLDL 12 02/21/2020    NONHDLCHOL 122 01/19/2016    CHOLHDLC 3 02/21/2020      LDL treated to goal.

## 2021-05-02 NOTE — Assessment & Plan Note
He is doing pretty well with peritoneal dialysis.  He certainly does not appear to be volume overloaded currently.

## 2021-05-02 NOTE — Assessment & Plan Note
Goal for his blood pressure is an average of 130/80 or less.

## 2021-05-07 ENCOUNTER — Encounter: Admit: 2021-05-07 | Discharge: 2021-05-07 | Payer: MEDICARE

## 2021-05-08 ENCOUNTER — Encounter: Admit: 2021-05-08 | Discharge: 2021-05-08 | Payer: MEDICARE

## 2021-05-08 ENCOUNTER — Ambulatory Visit: Admit: 2021-05-08 | Discharge: 2021-05-08 | Payer: MEDICARE

## 2021-05-08 DIAGNOSIS — I2510 Atherosclerotic heart disease of native coronary artery without angina pectoris: Secondary | ICD-10-CM

## 2021-05-08 DIAGNOSIS — E7800 Pure hypercholesterolemia, unspecified: Secondary | ICD-10-CM

## 2021-08-07 ENCOUNTER — Encounter: Admit: 2021-08-07 | Discharge: 2021-08-07 | Payer: MEDICARE

## 2021-09-19 ENCOUNTER — Encounter: Admit: 2021-09-19 | Discharge: 2021-09-19 | Payer: MEDICARE

## 2021-09-19 MED ORDER — APIXABAN 2.5 MG PO TAB
2.5 mg | ORAL_TABLET | Freq: Two times a day (BID) | ORAL | 3 refills | Status: AC
Start: 2021-09-19 — End: ?

## 2021-11-05 ENCOUNTER — Encounter: Admit: 2021-11-05 | Discharge: 2021-11-05 | Payer: MEDICARE

## 2021-11-22 ENCOUNTER — Encounter: Admit: 2021-11-22 | Discharge: 2021-11-22 | Payer: MEDICARE

## 2021-11-26 ENCOUNTER — Encounter: Admit: 2021-11-26 | Discharge: 2021-11-26 | Payer: MEDICARE

## 2021-11-26 DIAGNOSIS — E785 Hyperlipidemia, unspecified: Secondary | ICD-10-CM

## 2021-11-26 DIAGNOSIS — I4891 Unspecified atrial fibrillation: Secondary | ICD-10-CM

## 2021-11-26 DIAGNOSIS — N179 Acute kidney failure, unspecified: Secondary | ICD-10-CM

## 2021-11-26 DIAGNOSIS — I10 Essential (primary) hypertension: Secondary | ICD-10-CM

## 2021-11-26 DIAGNOSIS — C4490 Unspecified malignant neoplasm of skin, unspecified: Secondary | ICD-10-CM

## 2021-11-26 DIAGNOSIS — I4821 Permanent atrial fibrillation: Secondary | ICD-10-CM

## 2021-11-26 DIAGNOSIS — M549 Dorsalgia, unspecified: Secondary | ICD-10-CM

## 2021-11-26 DIAGNOSIS — E079 Disorder of thyroid, unspecified: Secondary | ICD-10-CM

## 2021-11-26 DIAGNOSIS — I2510 Atherosclerotic heart disease of native coronary artery without angina pectoris: Secondary | ICD-10-CM

## 2021-11-26 DIAGNOSIS — Z9889 Other specified postprocedural states: Secondary | ICD-10-CM

## 2021-11-26 DIAGNOSIS — R011 Cardiac murmur, unspecified: Secondary | ICD-10-CM

## 2021-11-26 DIAGNOSIS — I214 Non-ST elevation (NSTEMI) myocardial infarction: Secondary | ICD-10-CM

## 2021-11-26 DIAGNOSIS — Z952 Presence of prosthetic heart valve: Secondary | ICD-10-CM

## 2021-11-26 DIAGNOSIS — G4733 Obstructive sleep apnea (adult) (pediatric): Secondary | ICD-10-CM

## 2021-11-26 DIAGNOSIS — I350 Nonrheumatic aortic (valve) stenosis: Secondary | ICD-10-CM

## 2021-11-26 DIAGNOSIS — I724 Aneurysm of artery of lower extremity: Secondary | ICD-10-CM

## 2021-11-26 DIAGNOSIS — C44621 Squamous cell carcinoma of skin of unspecified upper limb, including shoulder: Secondary | ICD-10-CM

## 2021-11-26 NOTE — Assessment & Plan Note
I encouraged him to see his dentist regularly.  I'll plan another echocardiogram when I see him back in about 6 months.

## 2021-11-26 NOTE — Assessment & Plan Note
He's maintained on Eliquis--no bleeding complications and no thromboembolic events.

## 2021-11-26 NOTE — Assessment & Plan Note
He's done very since his clip procedure.

## 2021-11-26 NOTE — Progress Notes
Date of Service: 11/26/2021    Joel Hernandez is a 81 y.o. male.       HPI     Joel Hernandez was in the [facility] clinic today for follow-up regarding his complex cardiovascular disease.  He has coronary disease, bioprosthetic aortic valve, permanent atrial fibrillation, pacemaker, recent mitral valve clip.  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  He was turned down for renal transplant by the [facility] program.  ?  He had MitraClip in February, 2022, and since then says that he feels great.  He is having no trouble with peripheral edema or exertional breathlessness.  He denies any exertional chest discomfort.  He remains on Eliquis and has had no TIA or stroke symptoms.  He has had no bleeding complications.  ?  He denies any syncope or near syncope.    ?         Vitals:    11/26/21 1247   BP: 128/68   BP Source: Arm, Left Upper   Pulse: 83   SpO2: 99%   O2 Device: None (Room air)   PainSc: Zero   Weight: 98.4 kg (217 lb)   Height: 182.9 cm (6')     Body mass index is 29.43 kg/m?Marland Kitchen     Past Medical History  Patient Active Problem List    Diagnosis Date Noted   ? Hematoma of left kidney 06/07/2020   ? CKD (chronic kidney disease) 05/02/2020   ? S/P mitral valve clip implantation 03/21/2020     03/21/20: Dr. Helen Hashimoto   04/25/2020 - ECHO:  The left ventricular systolic function is normal. The ejection fraction by Simpson's biplane method is 50%  The right ventricle is mildly dilated. The right ventricular systolic function is normal.  MitraClip X 1 with mild residual regurgitation present.  Bioprosthetic aortic valve present- gradient not assessed.  Moderate tricuspid valve regurgitation present.       ? ESRD (end stage renal disease) (HCC) 03/21/2020   ? Chronic diastolic heart failure, NYHA class 3 (HCC) 02/28/2020   ? ATN (acute tubular necrosis) (HCC) 02/11/2020   ? Hypotension 02/11/2020     Due to drug        ? Nonrheumatic mitral valve regurgitation 02/11/2020   ? AKI (acute kidney injury) (HCC) 02/09/2020   ? 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 Unplanned return to operating room with repeat sternotomy for cardiac tamponade 07/29/2018     07/24/18 - Redo sternotomy with evacuation of hematoma     ? 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     ? S/p bare metal coronary artery stent 07/06/2018     x2 05/2018     ? RBBB 06/10/2018   ? Hypokalemia 06/10/2018   ? Hypomagnesemia 06/10/2018   ? 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     04/2018-found incidentally on CTA but confirmed by groin duplex     ? Foot drop 12/04/2016   ? HLD (hyperlipidemia) 12/04/2016   ? 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 Coronary artery disease due to calcified coronary lesion 01/19/2016  12/2015 - NSTEMI, LAD stent     ? Permanent atrial fibrillation (HCC) 01/18/2016     01/2016 - AF noted during hospitalization for unstable angina.  Attempt at DC cardioversion unsuccessful.  DC'ed on amiodarone & Eliquis.    01/2016 - Successful outpatient cardioversion at [facility].  Amiodarone continued    02/2016 - Office follow-up--back in AF, asymptomatic with good rate control.     ? Hypertension 09/15/2013     BP medication started in mid-30's     ? Obstructive sleep apnea 09/15/2013     Circa 2004 - Diagnosis established, CPAP started (Dr. Mayford Knife, pulmonologist in [facility])           Review of Systems   Constitutional: Negative.   HENT: Negative.    Eyes: Negative.    Cardiovascular: Negative.    Respiratory: Negative.    Endocrine: Negative.    Hematologic/Lymphatic: Negative.    Skin: Negative.    Musculoskeletal: Negative.    Gastrointestinal: Negative.    Genitourinary: Negative.    Neurological: Negative.    Psychiatric/Behavioral: Negative.    Allergic/Immunologic: Negative.        Physical Exam    Physical Exam   General Appearance: no distress   Skin: warm, no ulcers or xanthomas   Digits and Nails: no cyanosis or clubbing   Eyes: conjunctivae and lids normal, pupils are equal and round   Teeth/Gums/Palate: dentition unremarkable, no lesions   Lips & Oral Mucosa: no pallor or cyanosis   Neck Veins: normal JVP , neck veins are not distended   Thyroid: no nodules, masses, tenderness or enlargement   Chest Inspection: chest is normal in appearance   Respiratory Effort: breathing comfortably, no respiratory distress   Auscultation/Percussion: lungs clear to auscultation, no rales or rhonchi, no wheezing   PMI: PMI not enlarged or displaced   Cardiac Rhythm: regular rhythm and normal rate   Cardiac Auscultation: S1, S2 normal, no rub, no gallop   Murmurs: grade 2 systolic murmur   Peripheral Circulation: normal peripheral circulation   Carotid Arteries: normal carotid upstroke bilaterally, no bruits   Radial Arteries: normal symmetric radial pulses   Abdominal Aorta: no abdominal aortic bruit   Pedal Pulses: normal symmetric pedal pulses   Lower Extremity Edema: no lower extremity edema   Abdominal Exam: soft, non-tender, no masses, bowel sounds normal   Liver & Spleen: no organomegaly   Gait & Station: walks without assistance   Muscle Strength: normal muscle tone   Orientation: oriented to time, place and person   Affect & Mood: appropriate and sustained affect   Language and Memory: patient responsive and seems to comprehend information   Neurologic Exam: neurological assessment grossly intact   Other: moves all extremities      Cardiovascular Health Factors  Vitals BP Readings from Last 3 Encounters:   11/26/21 128/68   05/08/21 (!) 147/79   05/02/21 110/58     Wt Readings from Last 3 Encounters:   11/26/21 98.4 kg (217 lb)   05/08/21 95.3 kg (210 lb)   05/02/21 97.1 kg (214 lb)     BMI Readings from Last 3 Encounters:   11/26/21 29.43 kg/m?   05/08/21 28.48 kg/m?   05/02/21 29.02 kg/m?      Smoking Social History     Tobacco Use   Smoking Status Former   ? Packs/day: 0.50   ? Years: 15.00   ? Additional pack years: 0.00   ? Total pack years: 7.50   ?  Types: Cigarettes   ? Quit date: 11/20/1969   ? Years since quitting: 52.0   Smokeless Tobacco Never      Lipid Profile Cholesterol   Date Value Ref Range Status   02/21/2020 108  Final     HDL   Date Value Ref Range Status   02/21/2020 39 (L) >=40 Final     LDL   Date Value Ref Range Status   02/21/2020 57  Final     Triglycerides   Date Value Ref Range Status   02/21/2020 62  Final      Blood Sugar Hemoglobin A1C   Date Value Ref Range Status   03/20/2020 5.4 4.0 - 6.0 % Final     Comment:     The ADA recommends that most patients with type 1 and type 2 diabetes maintain   an A1c level <7%.       Glucose   Date Value Ref Range Status   10/22/2021 85  Final   05/01/2021 78  Final   12/12/2020 99  Final     Glucose Fasting   Date Value Ref Range Status   05/29/2020 102  Final     Glucose, POC   Date Value Ref Range Status   08/03/2018 92 70 - 100 MG/DL Final   84/13/2440 96 70 - 100 MG/DL Final   11/23/2534 644 (H) 70 - 100 MG/DL Final          Problems Addressed Today  Encounter Diagnoses   Name Primary?   ? Permanent atrial fibrillation (HCC)    ? Coronary artery disease due to calcified coronary lesion    ? Primary hypertension    ? S/P AVR (aortic valve replacement) and aortoplasty    ? S/P mitral valve clip implantation        Assessment and Plan       Permanent atrial fibrillation (HCC)  He's maintained on Eliquis--no bleeding complications and no thromboembolic events.    Coronary artery disease due to calcified coronary lesion  No chest discomfort or other symptoms to suggest progression.    Hypertension  BP looks OK on current medications.  There has been some discussion about increasing amlodipine because his home BP monitor gives higher numbers than [facility] or the doctors' offices.  I suggested that he bring his home BP monitor to [facility] to verify its accuracy.    S/P AVR (aortic valve replacement) and aortoplasty  I encouraged him to see his dentist regularly.  I'll plan another echocardiogram when I see him back in about 6 months.    S/P mitral valve clip implantation  He's done very since his clip procedure.      Current Medications (including today's revisions)  ? acetaminophen (TYLENOL) 325 mg tablet Take two tablets by mouth every 6 hours as needed.   ? amLODIPine (NORVASC) 5 mg tablet Take one tablet by mouth daily.   ? apixaban (ELIQUIS) 2.5 mg tablet Take one tablet by mouth twice daily.   ? atorvastatin (LIPITOR) 40 mg tablet TAKE 1 TABLET BY MOUTH DAILY   ? cholecalciferol (vitamin D3) (VITAMIN D3) 1,000 units tablet Take one tablet by mouth daily.   ? ferrous sulfate (FEOSOL) 325 mg (65 mg iron) tablet Take one tablet by mouth daily.   ? finasteride (PROSCAR) 5 mg tablet TAKE 1 TABLET BY MOUTH EVERY DAY   ? methIMAzole (TAPAZOLE) 10 mg tablet Take one-half tablet by mouth daily.   ? metoprolol tartrate (LOPRESSOR) 50 mg tablet  Take one tablet by mouth twice daily. Hold for HR < 55 and/or SBP<110   ? nitroglycerin (NITROSTAT) 0.4 mg tablet as Needed.   ? tadalafiL (CIALIS) 20 mg tablet Take one tablet by mouth as Needed.   ? tamsulosin (FLOMAX) 0.4 mg capsule Take one capsule by mouth daily.     Total time spent on today's office visit was 45 minutes.  This includes face-to-face in person visit with patient as well as nonface-to-face time including review of the EMR, outside records, labs, radiologic studies, echocardiogram & other cardiovascular studies, formation of treatment plan, after visit summary, future disposition, and lastly on documentation.

## 2021-11-26 NOTE — Assessment & Plan Note
No chest discomfort or other symptoms to suggest progression.

## 2022-02-04 ENCOUNTER — Encounter: Admit: 2022-02-04 | Discharge: 2022-02-04 | Payer: MEDICARE

## 2022-03-06 ENCOUNTER — Encounter: Admit: 2022-03-06 | Discharge: 2022-03-06 | Payer: MEDICARE

## 2022-03-06 DIAGNOSIS — I10 Essential (primary) hypertension: Secondary | ICD-10-CM

## 2022-03-06 DIAGNOSIS — I4821 Permanent atrial fibrillation: Secondary | ICD-10-CM

## 2022-03-06 DIAGNOSIS — E7800 Pure hypercholesterolemia, unspecified: Secondary | ICD-10-CM

## 2022-03-06 DIAGNOSIS — I2510 Atherosclerotic heart disease of native coronary artery without angina pectoris: Secondary | ICD-10-CM

## 2022-03-06 DIAGNOSIS — Z952 Presence of prosthetic heart valve: Secondary | ICD-10-CM

## 2022-03-06 MED ORDER — ATORVASTATIN 40 MG PO TAB
40 mg | ORAL_TABLET | Freq: Every day | ORAL | 1 refills | Status: AC
Start: 2022-03-06 — End: ?

## 2022-04-08 ENCOUNTER — Ambulatory Visit: Admit: 2022-04-08 | Discharge: 2022-04-08 | Payer: MEDICARE

## 2022-04-08 ENCOUNTER — Encounter: Admit: 2022-04-08 | Discharge: 2022-04-08 | Payer: MEDICARE

## 2022-04-08 DIAGNOSIS — Z950 Presence of cardiac pacemaker: Secondary | ICD-10-CM

## 2022-04-08 DIAGNOSIS — I4510 Unspecified right bundle-branch block: Secondary | ICD-10-CM

## 2022-04-10 ENCOUNTER — Encounter: Admit: 2022-04-10 | Discharge: 2022-04-10 | Payer: MEDICARE

## 2022-04-10 DIAGNOSIS — Z950 Presence of cardiac pacemaker: Secondary | ICD-10-CM

## 2022-05-05 ENCOUNTER — Encounter: Admit: 2022-05-05 | Discharge: 2022-05-05 | Payer: MEDICARE

## 2022-06-16 ENCOUNTER — Encounter: Admit: 2022-06-16 | Discharge: 2022-06-16 | Payer: MEDICARE

## 2022-06-17 ENCOUNTER — Encounter: Admit: 2022-06-17 | Discharge: 2022-06-17 | Payer: MEDICARE

## 2022-06-17 DIAGNOSIS — I10 Essential (primary) hypertension: Secondary | ICD-10-CM

## 2022-06-17 DIAGNOSIS — I214 Non-ST elevation (NSTEMI) myocardial infarction: Secondary | ICD-10-CM

## 2022-06-17 DIAGNOSIS — I2510 Atherosclerotic heart disease of native coronary artery without angina pectoris: Secondary | ICD-10-CM

## 2022-06-17 DIAGNOSIS — C4490 Unspecified malignant neoplasm of skin, unspecified: Secondary | ICD-10-CM

## 2022-06-17 DIAGNOSIS — I4821 Permanent atrial fibrillation: Secondary | ICD-10-CM

## 2022-06-17 DIAGNOSIS — C44621 Squamous cell carcinoma of skin of unspecified upper limb, including shoulder: Secondary | ICD-10-CM

## 2022-06-17 DIAGNOSIS — R011 Cardiac murmur, unspecified: Secondary | ICD-10-CM

## 2022-06-17 DIAGNOSIS — M549 Dorsalgia, unspecified: Secondary | ICD-10-CM

## 2022-06-17 DIAGNOSIS — I5032 Chronic diastolic (congestive) heart failure: Secondary | ICD-10-CM

## 2022-06-17 DIAGNOSIS — I724 Aneurysm of artery of lower extremity: Secondary | ICD-10-CM

## 2022-06-17 DIAGNOSIS — E785 Hyperlipidemia, unspecified: Secondary | ICD-10-CM

## 2022-06-17 DIAGNOSIS — E7800 Pure hypercholesterolemia, unspecified: Secondary | ICD-10-CM

## 2022-06-17 DIAGNOSIS — Z9889 Other specified postprocedural states: Secondary | ICD-10-CM

## 2022-06-17 DIAGNOSIS — E079 Disorder of thyroid, unspecified: Secondary | ICD-10-CM

## 2022-06-17 DIAGNOSIS — N179 Acute kidney failure, unspecified: Secondary | ICD-10-CM

## 2022-06-17 DIAGNOSIS — I4891 Unspecified atrial fibrillation: Secondary | ICD-10-CM

## 2022-06-17 DIAGNOSIS — Z952 Presence of prosthetic heart valve: Secondary | ICD-10-CM

## 2022-06-17 DIAGNOSIS — G4733 Obstructive sleep apnea (adult) (pediatric): Secondary | ICD-10-CM

## 2022-06-17 DIAGNOSIS — I350 Nonrheumatic aortic (valve) stenosis: Secondary | ICD-10-CM

## 2022-06-17 NOTE — Assessment & Plan Note
NSTEMI with LAD stent in 2017.  Last ischemia assessment 01/2020 showed small area of apical ischemia, otherwise normal.  EF 53%

## 2022-06-17 NOTE — Assessment & Plan Note
At this point he's really dialysis-dependent for volume management.

## 2022-06-17 NOTE — Progress Notes
Date of Service: 06/17/2022    Joel Hernandez is a 82 y.o. male.       HPI     Joel Hernandez was in the [facility] clinic today for follow-up regarding his complex cardiovascular disease.  He has coronary disease, bioprosthetic aortic valve, permanent atrial fibrillation, pacemaker, recent mitral valve clip.  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  He was turned down for renal transplant by the [facility] program.     He had MitraClip in February, 2022, and since then says that he feels great.  He is having no trouble with peripheral edema or exertional breathlessness.  He denies any exertional chest discomfort.  He remains on Eliquis and has had no TIA or stroke symptoms.    He was hospitalized a couple of months ago for severe anemia.  He received 2 units of PRBCs and underwent panendoscopy, but didn't have pill endoscopy.  He received IV iron recently and his hemoglobin is starting to come up.  He hasn't had any overt GI bleeding.     He denies any syncope or near syncope.           Vitals:    06/17/22 1250   BP: 107/60   BP Source: Arm, Left Upper   Pulse: 68   SpO2: 100%   O2 Device: None (Room air)   PainSc: Zero   Weight: 89.3 kg (196 lb 12.8 oz)   Height: 182.9 cm (6')     Body mass index is 26.69 kg/m?Marland Kitchen     Past Medical History  Patient Active Problem List    Diagnosis Date Noted    Hematoma of left kidney 06/07/2020    CKD (chronic kidney disease) 05/02/2020    S/P mitral valve clip implantation 03/21/2020     03/21/20: Dr. Helen Hashimoto   04/25/2020 - ECHO:  The left ventricular systolic function is normal. The ejection fraction by Simpson's biplane method is 50%  The right ventricle is mildly dilated. The right ventricular systolic function is normal.  MitraClip X 1 with mild residual regurgitation present.  Bioprosthetic aortic valve present- gradient not assessed.  Moderate tricuspid valve regurgitation present.        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  Attempt at DC cardioversion unsuccessful.  DC'ed on amiodarone & Eliquis.    01/2016 - Successful outpatient cardioversion at [facility].  Amiodarone continued    02/2016 - Office follow-up--back in AF, asymptomatic with good rate control.      Hypertension 09/15/2013     BP medication started in mid-30's      Obstructive sleep apnea 09/15/2013     Circa 2004 - Diagnosis established, CPAP started (Dr. Mayford Knife, pulmonologist in [facility])           Review of Systems Constitutional: Negative.   HENT: Negative.     Eyes: Negative.    Cardiovascular: Negative.    Respiratory: Negative.     Endocrine: Negative.    Hematologic/Lymphatic: Negative.    Skin: Negative.    Musculoskeletal: Negative.    Gastrointestinal: Negative.    Genitourinary: Negative.    Neurological: Negative.    Psychiatric/Behavioral: Negative.     Allergic/Immunologic: Negative.        Physical Exam    Physical Exam   General Appearance: no distress, pale   Skin: warm, no ulcers or xanthomas   Digits and Nails: no cyanosis or clubbing   Eyes: conjunctivae and lids normal, pupils are equal and round   Teeth/Gums/Palate: dentition unremarkable, no lesions   Lips & Oral Mucosa: no pallor or cyanosis   Neck Veins: normal JVP , neck veins are not distended   Thyroid: no nodules, masses, tenderness or enlargement   Chest Inspection: chest is normal in appearance   Respiratory Effort: breathing comfortably, no respiratory distress   Auscultation/Percussion: lungs clear to auscultation, no rales or rhonchi, no wheezing   PMI: PMI not enlarged or displaced   Cardiac Rhythm: regular rhythm and normal rate   Cardiac Auscultation: S1, S2 normal, no rub, no gallop   Murmurs: no murmur   Peripheral Circulation: normal peripheral circulation   Carotid Arteries: normal carotid upstroke bilaterally, no bruits   Radial Arteries: normal symmetric radial pulses   Abdominal Aorta: no abdominal aortic bruit   Pedal Pulses: normal symmetric pedal pulses   Lower Extremity Edema: no lower extremity edema   Abdominal Exam: soft, non-tender, no masses, bowel sounds normal   Liver & Spleen: no organomegaly   Gait & Station: walks without assistance   Muscle Strength: normal muscle tone   Orientation: oriented to time, place and person   Affect & Mood: appropriate and sustained affect   Language and Memory: patient responsive and seems to comprehend information   Neurologic Exam: neurological assessment grossly intact   Other: moves all extremities      Cardiovascular Health Factors  Vitals BP Readings from Last 3 Encounters:   06/17/22 107/60   11/26/21 128/68   05/08/21 (!) 147/79     Wt Readings from Last 3 Encounters:   06/17/22 89.3 kg (196 lb 12.8 oz)   11/26/21 98.4 kg (217 lb)   05/08/21 95.3 kg (210 lb)     BMI Readings from Last 3 Encounters:   06/17/22 26.69 kg/m?   11/26/21 29.43 kg/m?  05/08/21 28.48 kg/m?      Smoking Social History     Tobacco Use   Smoking Status Former    Current packs/day: 0.00    Average packs/day: 0.5 packs/day for 15.0 years (7.5 ttl pk-yrs)    Types: Cigarettes    Start date: 11/21/1954    Quit date: 11/20/1969    Years since quitting: 52.6   Smokeless Tobacco Never      Lipid Profile Cholesterol   Date Value Ref Range Status   02/21/2020 108  Final     HDL   Date Value Ref Range Status   02/21/2020 39 (L) >=40 Final     LDL   Date Value Ref Range Status   02/21/2020 57  Final     Triglycerides   Date Value Ref Range Status   02/21/2020 62  Final      Blood Sugar Hemoglobin A1C   Date Value Ref Range Status   03/20/2020 5.4 4.0 - 6.0 % Final     Comment:     The ADA recommends that most patients with type 1 and type 2 diabetes maintain   an A1c level <7%.       Glucose   Date Value Ref Range Status   10/22/2021 85  Final   05/01/2021 78  Final   12/12/2020 99  Final     Glucose Fasting   Date Value Ref Range Status   05/29/2020 102  Final     Glucose, POC   Date Value Ref Range Status   08/03/2018 92 70 - 100 MG/DL Final   16/10/9602 96 70 - 100 MG/DL Final   54/09/8117 147 (H) 70 - 100 MG/DL Final          Problems Addressed Today  Encounter Diagnoses   Name Primary?    Permanent atrial fibrillation (HCC) Yes    Chronic diastolic heart failure, NYHA class 3 (HCC)     Coronary artery disease due to calcified coronary lesion     Pure hypercholesterolemia     S/P AVR (aortic valve replacement) and aortoplasty     S/P mitral valve clip implantation     Primary hypertension        Assessment and Plan Permanent atrial fibrillation (HCC)  He's maintained on Eliquis.  We had discussed Watchman a couple of years ago, but when he went on dialysis he says he thought he was going to die so he didn't proceed with the Watchman work-up.  I gave him more information today and suggested that he discuss the procedure with his son.  I think he'd be a good candidate at this point and hope that he agrees to proceed.  We'll call him in about a week to see what he's thinking.    Chronic diastolic heart failure, NYHA class 3 (HCC)  At this point he's really dialysis-dependent for volume management.    Coronary artery disease due to calcified coronary lesion  NSTEMI with LAD stent in 2017.  Last ischemia assessment 01/2020 showed small area of apical ischemia, otherwise normal.  EF 53%    HLD (hyperlipidemia)  Lab Results   Component Value Date    CHOL 108 02/21/2020    TRIG 62 02/21/2020    HDL 39 (L) 02/21/2020    LDL 57 02/21/2020    VLDL 12 02/21/2020    NONHDLCHOL 122 01/19/2016    CHOLHDLC 3 02/21/2020          S/P AVR (aortic valve replacement) and  aortoplasty  Last echo 04/2021 showed normal bioprosthetic AV and ascending aorta graft.    S/P mitral valve clip implantation  Last echo 04/2021 showed mild MR, mild diastolic gradient with normal appearance s/p clip.    Current Medications (including today's revisions)   apixaban (ELIQUIS) 2.5 mg tablet Take one tablet by mouth twice daily.    atorvastatin (LIPITOR) 40 mg tablet TAKE 1 TABLET BY MOUTH EVERY DAY    calcium carbonate (OS-CAL) 500 mg calcium (1,250 mg) chewable tablet Chew one tablet by mouth daily.    cholecalciferol (vitamin D3) (VITAMIN D3) 1,000 units tablet Take one tablet by mouth daily.    COPPER PO Take  by mouth.    cyanocobalamin (vitamin B-12) 1,000 mcg tablet Take one tablet by mouth daily.    ferrous sulfate (FEOSOL) 325 mg (65 mg iron) tablet Take one tablet by mouth daily.    finasteride (PROSCAR) 5 mg tablet TAKE 1 TABLET BY MOUTH EVERY DAY folic acid (FOLVITE) 1 mg tablet TAKE 1 TABLET BY MOUTH DAILY FOR FOLIC ACID DEFICIENCY    methIMAzole (TAPAZOLE) 10 mg tablet Take one-half tablet by mouth daily.    metoprolol tartrate (LOPRESSOR) 50 mg tablet Take one tablet by mouth twice daily. Hold for HR < 55 and/or SBP<110    nitroglycerin (NITROSTAT) 0.4 mg tablet as Needed.    pantoprazole DR (PROTONIX) 40 mg tablet TAKE 1 TABLET BY MOUTH EVERY DAY FOR GERD    potassium chloride (K-TAB) 20 mEq tablet Take one tablet by mouth daily.    tadalafiL (CIALIS) 20 mg tablet Take one tablet by mouth as Needed.    tamsulosin (FLOMAX) 0.4 mg capsule Take one capsule by mouth daily.     Total time spent on today's office visit was 45 minutes.  This includes face-to-face in person visit with patient as well as nonface-to-face time including review of the EMR, outside records, labs, radiologic studies, echocardiogram & other cardiovascular studies, formation of treatment plan, after visit summary, future disposition, and lastly on documentation.

## 2022-06-17 NOTE — Patient Instructions
A Watchman left atrial appendage occlusion device could be considered so that we could stop the Eliquis.

## 2022-06-17 NOTE — Assessment & Plan Note
Lab Results   Component Value Date    CHOL 108 02/21/2020    TRIG 62 02/21/2020    HDL 39 (L) 02/21/2020    LDL 57 02/21/2020    VLDL 12 02/21/2020    NONHDLCHOL 122 01/19/2016    CHOLHDLC 3 02/21/2020

## 2022-06-17 NOTE — Assessment & Plan Note
Last echo 04/2021 showed mild MR, mild diastolic gradient with normal appearance s/p clip.

## 2022-06-17 NOTE — Assessment & Plan Note
Last echo 04/2021 showed normal bioprosthetic AV and ascending aorta graft.

## 2022-06-25 ENCOUNTER — Encounter: Admit: 2022-06-25 | Discharge: 2022-06-25 | Payer: MEDICARE

## 2022-06-25 NOTE — Telephone Encounter
-----   Message from Joel Hernandez M sent at 06/17/2022  1:15 PM CDT -----  Regarding: watchman  Pt given brochere call and discuss watchman

## 2022-06-27 ENCOUNTER — Encounter: Admit: 2022-06-27 | Discharge: 2022-06-27 | Payer: MEDICARE

## 2022-06-27 NOTE — Telephone Encounter
Called lmom requested call back to schedule appt

## 2022-06-27 NOTE — Telephone Encounter
-----   Message from Bertram Millard sent at 06/17/2022  1:15 PM CDT -----  Regarding: watchman  Pt given brochere call and discuss watchman

## 2022-07-04 ENCOUNTER — Encounter: Admit: 2022-07-04 | Discharge: 2022-07-04 | Payer: MEDICARE

## 2022-07-04 NOTE — Telephone Encounter
-----   Message from Bertram Millard sent at 06/17/2022  1:15 PM CDT -----  Regarding: watchman  Pt given brochere call and discuss watchman

## 2022-07-04 NOTE — Telephone Encounter
Pt will check with son to see if he is available to come with him 7/3.

## 2022-07-25 ENCOUNTER — Encounter: Admit: 2022-07-25 | Discharge: 2022-07-25 | Payer: MEDICARE

## 2022-07-30 ENCOUNTER — Encounter: Admit: 2022-07-30 | Discharge: 2022-07-30 | Payer: MEDICARE

## 2022-07-30 DIAGNOSIS — E785 Hyperlipidemia, unspecified: Secondary | ICD-10-CM

## 2022-07-30 DIAGNOSIS — I2510 Atherosclerotic heart disease of native coronary artery without angina pectoris: Secondary | ICD-10-CM

## 2022-07-30 DIAGNOSIS — C44621 Squamous cell carcinoma of skin of unspecified upper limb, including shoulder: Secondary | ICD-10-CM

## 2022-07-30 DIAGNOSIS — E079 Disorder of thyroid, unspecified: Secondary | ICD-10-CM

## 2022-07-30 DIAGNOSIS — C4490 Unspecified malignant neoplasm of skin, unspecified: Secondary | ICD-10-CM

## 2022-07-30 DIAGNOSIS — I4821 Permanent atrial fibrillation: Secondary | ICD-10-CM

## 2022-07-30 DIAGNOSIS — R0989 Other specified symptoms and signs involving the circulatory and respiratory systems: Secondary | ICD-10-CM

## 2022-07-30 DIAGNOSIS — R011 Cardiac murmur, unspecified: Secondary | ICD-10-CM

## 2022-07-30 DIAGNOSIS — N186 End stage renal disease: Secondary | ICD-10-CM

## 2022-07-30 DIAGNOSIS — N179 Acute kidney failure, unspecified: Secondary | ICD-10-CM

## 2022-07-30 DIAGNOSIS — M549 Dorsalgia, unspecified: Secondary | ICD-10-CM

## 2022-07-30 DIAGNOSIS — D649 Anemia, unspecified: Secondary | ICD-10-CM

## 2022-07-30 DIAGNOSIS — Z9889 Other specified postprocedural states: Secondary | ICD-10-CM

## 2022-07-30 DIAGNOSIS — I214 Non-ST elevation (NSTEMI) myocardial infarction: Secondary | ICD-10-CM

## 2022-07-30 DIAGNOSIS — Z9189 Other specified personal risk factors, not elsewhere classified: Secondary | ICD-10-CM

## 2022-07-30 DIAGNOSIS — I724 Aneurysm of artery of lower extremity: Secondary | ICD-10-CM

## 2022-07-30 DIAGNOSIS — I10 Essential (primary) hypertension: Secondary | ICD-10-CM

## 2022-07-30 DIAGNOSIS — K922 Gastrointestinal hemorrhage, unspecified: Secondary | ICD-10-CM

## 2022-07-30 DIAGNOSIS — G4733 Obstructive sleep apnea (adult) (pediatric): Secondary | ICD-10-CM

## 2022-07-30 DIAGNOSIS — I350 Nonrheumatic aortic (valve) stenosis: Secondary | ICD-10-CM

## 2022-07-30 DIAGNOSIS — I4891 Unspecified atrial fibrillation: Secondary | ICD-10-CM

## 2022-07-30 NOTE — Progress Notes
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 Edwinna Areola.  He has a history of permanent atrial fibrillation and has at least 3 cardioversions performed in the past.  He also has no history of atrial fibrillation.  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  He has no history of TIA or stroke or DVT/PE.  However, he has been on Eliquis 2.5 mg twice daily in view of end-stage renal disease on peritoneal dialysis.  More recently, he was diagnosed with anemia with his hemoglobin dropping down below 8 g/dL.  He also had an upper and lower endoscopy.  No capsule endoscopy was performed.  Source was not localizable.  He also has a history of coronary artery disease with previous PCI to his LAD, circumflex and right PDA.  He was offered a total of 6 units of transfusions, 2 weeks apart leading to improvement in his hemoglobin.  With iron infusions, his hemoglobin has stabilized.  His baseline values are between 9-12 g/dL.  He continues to be on Eliquis 2.5 mg p.o. twice daily.  Given prior history of GI bleed and ongoing need for oral anticoagulation, he has been referred to [place] for transcatheter left atrial appendage occlusion with Watchman device.  Prior sternotomy as outlined above.  No aspirin allergy or iodine contrast allergy.    Most recent 2D ECHO performed : April 2023  The left ventricle is mildly dilated. Concentric hypertrophy. The left ventricular systolic function is normal. The ejection fraction by Simpson's biplane method is 65%. There are no segmental wall motion abnormalities. Abnormal septal motion consistent with post-operative state. Unable to assess left ventricular diastolic function. Unable to assess left atrial pressure.  The right ventricle not well seen. Pacemaker lead present in the ventricle. Estimated PA systolic pressure 27m Hg.  Left atrium: Severely dilated.  Right atrium: Mildly dilated. Pacemaker lead present in the right atrium.  Mitral Valve: The mitral valve was not well seen. Transcatheter edge-edge mitral valve repair with MitraClip NTW. Mild-moderate stenosis ( Mean gradient 5 mm Hg at a heart rate of 60 bpm) Mild regurgitation.  Tricuspid Valve: The tricuspid valve was not well seen. Normal valve structure. No stenosis. Moderate regurgitation.  Aortic Valve: The aortic valve was not well seen. There is a 25 mm Medtronic Freestyle bioprosthetic valve present. No stenosis. Trace regurgitation.  Aorta: The aortic root and is normal in size. The ascending aorta is not well seen. Replacement of the ascending aorta using 28 mm Gelweave graft.  Compared to study dated 04/25/2020, no significant change was found. Mitral valve diastolic gradient is unchanged.    Most recent left heart catheterization performed: Please see above.    12 lead EKG this visit: Permanent atrial fibrillation      Assessment & Recs     Joel Hernandez is a 82 y.o. patient with the following problems:    Assessment:  Permanent atrial fibrillation, nonvalvular  High bleeding risk  High stroke risk  Prior GI bleed, source not localized.  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  Significantly elevated stroke risk based upon his CHA2DS2-VASc score and also elevated risk of bleeding per has bled score.  He has already had a GI bleed for which the source has not been localized.  Capsule endoscopy has not been performed.  6 units of blood transfusions were performed in the past.  Overall, he has an elevated bleeding risk and stroke risk with ongoing need for lifelong oral anticoagulation which appears prohibitive.  Shared decision was made between the patient, myself and primary not implanting cardiologist Dr. Barry Dienes to proceed along transcatheter left atrial appendage occlusion route.  Risk benefits and alternatives of the procedure were presented to the patient.  An evidence-based tool was provided to him from our institution to outline these factors and he made his decision based off of these to proceed along this route.  2.  I reviewed his CTA from 2020: Ostium of the left atrial appendage was 18-22 mmHg with 39 mm depth and large pectinate muscles with a windsock morphology.  Recommend 27 mm device with a double curve  3.  No further imaging necessary  4.  Lovenox bridging may be necessary  5.  Case will be performed under intracardiac echo, moderate conscious sedation by Cath Lab RNs.    Shared Decision Making Note for Transcatheter Left Atrial Appendage Closure [facility]) for Non-Valvular Atrial Fibrillation    This patient is being referred to the [facility] [place] [facility] system for evaluation for Left Atrial Appendage Closure [facility]) for management of stroke risk resulting from non-valvular atrial fibrillation (nonvalvular atrial fibrillation constitutes atrial fibrillation in the absence of moderate to severe mitral stenosis or any mechanical valve based on 2019 focused update on the management of atrial fibrillation from ACC/AHA)    Based on their past history and an evidence based tool, it has been determined that they are poor candidates for long-term oral-anticoagulation, however may be tolerant of short term treatment with warfarin or other direct acting oral anticoagulants as necessary.     Risk, benefits, alternatives of the procedure (technique of transcatheter left atrial appendage occlusion, need for general anesthesia or conscious sedation, procedure related complications including transseptal access related complications, intraprocedural stroke, bleeding, pericardial effusion, perforation needing cardiac surgery, complications from access sites, possible need for intracardiac echocardiography whenever clinically indicated and from the patient preference standpoint and other unforeseen complications etc. and in addition postprocedural care which includes oral anticoagulation/antiplatelet therapy for up to 6 months, repeat transesophageal echocardiography/CTA for follow-up assessment of DART/leaks of clinical significance etc. )were explained to the patient who verbalized understanding of these conditions and consented to the procedure.  I have also explained to the patient the incidence of device related thrombosis, clinically significant peridevice leaks communicating with the appendage with the possibility of prolonging oral antiplatelet/anticoagulation or needing additional closure devices/coils to seal residual leaks.  The patient verbalized understanding of these conditions and consented to the procedure.     Their individual CHA2DS2-VASc stroke risk score, based on past history is  indicated below:      The patient's current CHA2DS2-VASC score is: Congestive heart failure, hypertension, age above 75-2, prior MI-1: Total  5    The patient's high bleeding risk on anticoagulation has been based upon the following clinical factors:          Current HAS-BLED score of the patient is hypertension-1, abnormal renal function-1, bleeding-1, elderly-1, need for antiplatelet therapy/drugs-1: Total 5    We have discussed their unique stroke and bleeding risk both on and off oral-anticoagulation, and the rationale for this referral for transcatheter left atrial appendage closure.     Based on both stroke and bleeding risk, a shared decision has been made to pursue transcatheter closure of the left atrial appendage as a safe and effective alternative to oral anticoagulant therapy for stroke prevention and to reduce their long term risk of incidence of intra cerebral bleeding.      Thank you for allowing [place] to participate in the care of this patient.     Jonell Cluck, MD, [facility]  Interventional Cardiologist    Current medications and allergies reviewed    Past Medical History  Past Medical History:   Diagnosis Date    AKI (acute kidney injury) (HCC)     Elevated Creatine    Aortic stenosis     Atrial fibrillation (HCC)     Back pain     CAD (coronary artery disease)     Disorder of thyroid gland     Heart murmur 09/15/2013    Hyperlipidemia     Hypertension 09/15/2013    NSTEMI (non-ST elevated myocardial infarction) (HCC) 12/2015    s/p stent placement    OSA on CPAP     No CPAP use after 55lb weight loss    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    04/2018-found incidentally on CTA but confirmed by groin duplex    SCC (squamous cell carcinoma), arm     Severe aortic stenosis     Skin cancer        Past Surgical History  Surgical History:   Procedure Laterality Date    LUMBAR LAMINECTOMY  2004    L4-5    LUMBAR LAMINECTOMY  2011    Hemilaminectomies L 2-3 L3-4    RIGHT LUMBAR 4-5 REDO LAMINECTOMY AND LATERAL RECESS DECOMPRESSION Right 12/04/2016    Performed by Theotis Barrio, MD at [facility] OR    LAMINECTOMY/ FACETECTOMY/ FORAMINOTOMY WITH DECOMPRESSION - 1 VERTEBRAL SEGMENT - LUMBAR  12/04/2016    Performed by Theotis Barrio, MD at [facility] OR    CORONARY STENT PLACEMENT  12/2016    KNEE REPLACEMENT Right 08/2017    ANGIOGRAPHY CORONARY ARTERY WITH LEFT HEART CATHETERIZATION N/A 05/24/2018    Performed by Marcell Barlow, MD at [facility] CATH LAB    POSSIBLE PERCUTANEOUS CORONARY STENT PLACEMENT WITH ANGIOPLASTY N/A 05/24/2018    Performed by Marcell Barlow, MD at [facility] CATH LAB    CLOSURE PSEUDOANEURYSM BY INJECTION- LOWER EXTREMITY N/A 05/26/2018    Performed by Marcell Barlow, MD at [facility] CATH LAB    CORONARY STENT PLACEMENT  05/2018    x2    PACEMAKER INSERTION  06/2018    Aortic Root replacement (Freestyle)/Implantation of coronary artery buttons, Circ arrest N/A 07/21/2018    Performed by Earlene Plater (Trip) L III, MD at [facility] CVOR    INSERTION/ REPLACEMENT PERMANENT PACEMAKER WITH ATRIAL AND VENTRICULAR LEAD Left 07/23/2018    Performed by Lorain Childes, MD at [facility] EP LAB    FLUOROSCOPY - CARDIAC  07/23/2018    Performed by Lorain Childes, MD at [facility] EP LAB  EXPLORATION POSTOPERATIVE WOUND - CHEST N/A 07/24/2018    Performed by Earlene Plater (Trip) L III, MD at [facility] CVOR    MITRAL VALVE SURGERY  03/21/2020    mitral clip per patient    Percutaneous Mitral Valve Repair with MitraClip/TEE N/A 03/21/2020    Performed by Arby Barrette, MD at [facility] CVOR    COLONOSCOPY      HEART CATHETERIZATION  01/2016, 05/2018    Stent x 1 LAD placement following NSTEMI, 2 stents placed 04/2018    HX HEART CATHETERIZATION      SKIN CANCER EXCISION  2017, 2018    SCC Arm        Social History  Social History     Socioeconomic History    Marital status: Widowed   Tobacco Use    Smoking status: Former     Current packs/day: 0.00     Average packs/day: 0.5 packs/day for 15.0 years (7.5 ttl pk-yrs)     Types: Cigarettes     Start date: 11/21/1954     Quit date: 11/20/1969     Years since quitting: 52.7    Smokeless tobacco: Never   Vaping Use    Vaping status: Never Used   Substance and Sexual Activity    Alcohol use: No    Drug use: Never       Family History  Family History   Problem Relation Name Age of Onset    Heart Disease Mother         Review of Systems    Review of Systems   Constitutional: Positive for malaise/fatigue and weight loss.   HENT: Negative.     Eyes: Negative.    Cardiovascular: Positive for dyspnea on exertion and leg swelling.   Respiratory: Negative.     Endocrine: Negative.    Hematologic/Lymphatic: Negative.    Skin: Negative.    Musculoskeletal:  Positive for back pain and muscle weakness.   Gastrointestinal: Negative.    Genitourinary: Negative.    Neurological:  Positive for weakness.   Psychiatric/Behavioral: Negative.     Allergic/Immunologic: Negative.        A 10 point review of systems was performed and was noted to be unremarkable except for facts mentioned in the HPI.    Physical Exam                          Vital Signs: Most Recent   Vitals:    07/30/22 0930   BP: 133/77   BP Source: Arm, Left Upper   Pulse: 82   SpO2: Comment: Unable to obtain reading in clinic today   O2 Device: None (Room air)   PainSc: Four   Weight: 85.5 kg (188 lb 6.4 oz)   Height: 182.9 cm (6')          Body mass index is 25.55 kg/m?Marland Kitchen    General Appearance: moderately overweight, no distress   Skin: warm, no ulcers or xanthomas; few ecchymoses   Eyes: conjunctivae and lids normal, pupils are equal and round   Neck Veins: normal JVP , neck veins are not distended   Thyroid: no nodules, masses, tenderness or enlargement   Cardiovascular system: Pulse 86/min, regular rhythm , normal volume, no specific character, felt equally in all peripheries and there was no radio femoral delay.  PMI undisplaced, no palpable thrills/heaves, S1 S2 heard, no murmurs, no rub, no jugular venous distension, no carotid bruit.  Peripheral Circulation: normal peripheral  circulation   Pedal Pulses: normal symmetric pedal pulses   Carotid Arteries: normal carotid upstroke bilaterally, no bruits   Respiratory system: No acute distress  No use of accessory muscles  Normal vesicular breath sounds over all the lung fields bilaterally  No added sounds  Lab/Radiology/Other Diagnostic Tests:    Lab results pertaining to the cardiovascular system, imaging such as echocardiography and coronary angiography was reviewed and independent interpretation is provided as outlined above.    Total Time Today was 60 minutes in the following activities: Preparing to see the patient, Obtaining and/or reviewing separately obtained history, Performing a medically appropriate examination and/or evaluation, Counseling and educating the patient/family/caregiver, Ordering medications, tests, or procedures, Referring and communication with other health care professionals (when not separately reported), Documenting clinical information in the electronic or other health record, Independently interpreting results (not separately reported) and communicating results to the patient/family/caregiver, and Care coordination (not separately reported)

## 2022-08-01 ENCOUNTER — Encounter: Admit: 2022-08-01 | Discharge: 2022-08-01 | Payer: MEDICARE

## 2022-08-03 ENCOUNTER — Encounter: Admit: 2022-08-03 | Discharge: 2022-08-02 | Payer: MEDICARE

## 2022-08-13 LAB — LIPID PROFILE
CHOLESTEROL/HDL %: 4.3
CHOLESTEROL: 120
HDL: 28 — ABNORMAL LOW (ref 40.0–60.0)
LDL: 60
TRIGLYCERIDES: 161 — ABNORMAL HIGH (ref 0.0–149.0)
VLDL: 32 — ABNORMAL HIGH (ref 0.0–29.0)

## 2022-08-14 ENCOUNTER — Encounter: Admit: 2022-08-14 | Discharge: 2022-08-14 | Payer: MEDICARE

## 2022-08-14 DIAGNOSIS — M549 Dorsalgia, unspecified: Secondary | ICD-10-CM

## 2022-08-14 NOTE — Patient Instructions
It was a pleasure seeing you in clinic today.  Please don't hesitate to call or send a MyChart message if you have any questions.       Rhyse Skowron RN, BSN  Clinical Nurse Coordinator  Dr. Joseph A. Weiner  [facility]  [facility]  [address]  [place], [place] [address]  [email or web address]  Phone: [phone]  Fax:  [phone]  Scheduling [phone]

## 2022-08-18 ENCOUNTER — Encounter: Admit: 2022-08-18 | Discharge: 2022-08-18 | Payer: MEDICARE

## 2022-08-18 ENCOUNTER — Ambulatory Visit: Admit: 2022-08-18 | Discharge: 2022-08-18 | Payer: MEDICARE

## 2022-08-18 DIAGNOSIS — I214 Non-ST elevation (NSTEMI) myocardial infarction: Secondary | ICD-10-CM

## 2022-08-18 DIAGNOSIS — I724 Aneurysm of artery of lower extremity: Secondary | ICD-10-CM

## 2022-08-18 DIAGNOSIS — I2510 Atherosclerotic heart disease of native coronary artery without angina pectoris: Secondary | ICD-10-CM

## 2022-08-18 DIAGNOSIS — E785 Hyperlipidemia, unspecified: Secondary | ICD-10-CM

## 2022-08-18 DIAGNOSIS — M549 Dorsalgia, unspecified: Secondary | ICD-10-CM

## 2022-08-18 DIAGNOSIS — E079 Disorder of thyroid, unspecified: Secondary | ICD-10-CM

## 2022-08-18 DIAGNOSIS — I10 Essential (primary) hypertension: Secondary | ICD-10-CM

## 2022-08-18 DIAGNOSIS — N179 Acute kidney failure, unspecified: Secondary | ICD-10-CM

## 2022-08-18 DIAGNOSIS — C44621 Squamous cell carcinoma of skin of unspecified upper limb, including shoulder: Secondary | ICD-10-CM

## 2022-08-18 DIAGNOSIS — G4733 Obstructive sleep apnea (adult) (pediatric): Secondary | ICD-10-CM

## 2022-08-18 DIAGNOSIS — I4891 Unspecified atrial fibrillation: Secondary | ICD-10-CM

## 2022-08-18 DIAGNOSIS — I350 Nonrheumatic aortic (valve) stenosis: Secondary | ICD-10-CM

## 2022-08-18 DIAGNOSIS — R011 Cardiac murmur, unspecified: Secondary | ICD-10-CM

## 2022-08-18 DIAGNOSIS — C4490 Unspecified malignant neoplasm of skin, unspecified: Secondary | ICD-10-CM

## 2022-08-18 DIAGNOSIS — M48062 Spinal stenosis, lumbar region with neurogenic claudication: Secondary | ICD-10-CM

## 2022-08-19 ENCOUNTER — Encounter: Admit: 2022-08-19 | Discharge: 2022-08-19 | Payer: MEDICARE

## 2022-08-19 DIAGNOSIS — I4821 Permanent atrial fibrillation: Secondary | ICD-10-CM

## 2022-08-19 DIAGNOSIS — I4891 Unspecified atrial fibrillation: Secondary | ICD-10-CM

## 2022-08-19 MED ORDER — ACETAMINOPHEN 325 MG PO TAB
650 mg | ORAL | 0 refills | PRN
Start: 2022-08-19 — End: ?

## 2022-08-19 MED ORDER — ALUMINUM-MAGNESIUM HYDROXIDE 200-200 MG/5 ML PO SUSP
30 mL | ORAL | 0 refills | PRN
Start: 2022-08-19 — End: ?

## 2022-08-19 MED ORDER — ENOXAPARIN 80 MG/0.8 ML SC SYRG
80 mg | Freq: Two times a day (BID) | SUBCUTANEOUS | 0 refills | 7.00000 days | Status: AC
Start: 2022-08-19 — End: ?

## 2022-08-19 MED ORDER — MAGNESIUM HYDROXIDE 400 MG/5 ML PO SUSP
30 mL | ORAL | 0 refills | PRN
Start: 2022-08-19 — End: ?

## 2022-08-19 MED ORDER — ASPIRIN 325 MG PO TAB
325 mg | Freq: Once | ORAL | 0 refills
Start: 2022-08-19 — End: ?

## 2022-08-19 MED ORDER — CEFAZOLIN INJ 1GM IVP
1 g | INTRAVENOUS | 0 refills
Start: 2022-08-19 — End: ?

## 2022-08-19 MED ORDER — CEFAZOLIN 2 GRAM IV SOLR
2 g | Freq: Once | INTRAVENOUS | 0 refills
Start: 2022-08-19 — End: ?

## 2022-08-19 NOTE — Telephone Encounter
-----   Message from Marcus C sent at 07/30/2022 10:26 AM CDT -----  Regarding: Watchman Referral  Hello,     This patient saw PGU today and qualifies for a Watchman. PGU states will need intracardiac echo, conscious sedation and lovenox bridging.     Thank you for your help!    Adelina Mings, RN

## 2022-08-19 NOTE — Telephone Encounter
Patient's identity was verified using two patient identifiers (name an date of birth).    Called patient to schedule Watchman implant with Dr. Riley Nearing  Patient agreed to date of service, 11/19/2022  Pre procedure appointments discussed.  Patient verbalized understanding of dates/times/locations of all appointments.  Pre procedure instructions sent via mail per request.

## 2022-08-19 NOTE — Patient Instructions
CARDIAC CATHETERIZATION   PRE-ADMISSION INSTRUCTIONS    Patient Name: Joel Hernandez  MRN#: [id number]  Date of Birth: [birth year] (82 y.o.)  Today's Date: 08/19/2022    PROCEDURE:  You are scheduled for a  Left Atrial Appendage Closure Device Placement (Watchman)  with Dr. Jonell Cluck.    PROCEDURE DATE AND ARRIVAL TIME:  Your procedure date is 11/19/2022.  You will receive a call from the Cath lab staff between 8:00 a.m. and noon on the business day prior to your procedure to let you know at what time to arrive on the day of your procedure.    Please check in at the Admitting Desk in the [facility] for your procedure.   Address:  [address] [place], [place] [address]    [facility] Entrance and and take a right. Continue down the hallway past the Cardiovascular Medicine office. That hall will take you into the [facility]. Check in at the desk on the left side.)     (If you have further questions regarding your arrival time for the CV lab, please call [phone] by 3:00pm the day before your procedure. Please leave a message with your name and number, your call will be returned in a timely manner.)    PRE-PROCEDURE APPOINTMENTS:    11/07/2022 at 1:00 pm   Office visit to update history and physical (requirement within 30 days of procedure)  with  Kathryne Sharper APRN  at Cardiovascular Medicine [facility]: [address], [place], [address]       11/07/2022   Pre-Admission lab work required within 14 days of procedure: BMP, CBC, and Magnesium  at the [place] lab of your choice.  Please get your labs drawn before you leave the hospital on this day.         FOOD AND DRINK INSTRUCTIONS  Nothing to eat after midnight before your procedure. No caffeine for 24 hours prior to your procedure. You will be under moderate sedation for your procedure.  You may drink clear liquids up to an hour before hospital arrival. This will be confirmed by the Cath lab staff the day before your procedure.     SPECIAL MEDICATION INSTRUCTIONS  Any new prescriptions will be sent to your pharmacy listed on file with [place].     apixaban (Eliquis) -- hold for 2 DOSES prior to procedure.  Your last dose will be on 11/18/2022 am dose.  This medication will be restarted after your procedure      enoxaparin (Lovenox) -- this medication should have been picked up at your local pharmacy.  Inject one dose the night before your procedure (when you are holding your Eliquis)    HOLD ALL over the counter vitamins or supplements on the morning of your procedure.      Additional Instructions  If you wear CPAP, please bring your mask and machine with you to the hospital.    Take a bath or shower with anti-bacterial soap the evening before, or the morning of the procedure.     Bring photo ID and your health insurance card(s).    Arrange for a driver to take you home from the hospital. Please arrange for a friend or family member to take you home from this test. You cannot take a Taxi, [employer]etto Goad, or public transportation as there has to be a responsible person to help care for you after sedation    Bring an accurate list of  your current medications with you to the hospital (all medications and supplements taken daily).  Please use the medication list below and write in the date and time when you took your last dose before your procedure. Update this list of medications as needed.      Wear comfortable clothes and don't bring valuables, other than photo identification card, with you to the hospital.    Please pack a bag for an overnight stay.     Please review your pre-procedure instructions and bring them with you on the day of your procedure.  Call the office at [phone] with any questions. You may ask to speak with Dr. Patrica Duel nurse.        ALLERGIES  Allergies   Allergen Reactions    Lisinopril SHORTNESS OF BREATH       IMPORTANT INFORMATION:    VISITORS:  Per the current visitor policy for the Cardiovascular Labs, patients are allowed 1 visitor only in the pre/post rooms and no visitors are allowed to stay overnight with the patient.  There are several hotels near the hospital if you need to arrange for accommodations for your family or friend providing support and transportation home after your procedure.     FOLLOW UP APPTS:  There are several follow up appointments throughout the first year after implantation of this device.  I will contact you to schedule throughout the year.    Also, please remember:    You MAY NEED to take antibiotics prior to any dental procedures (including cleaning) for 6 months after your device placement.  This will help to prevent infection to the device.  Contact your primary care provider or cardiologist for an antibiotic prescription to be taken prior to any dental procedures.    _________________________________________  Form completed by: Tempie Hoist, BSN  Date completed: 08/19/22  Method: Via telephone and mailed to the patient.    Please let me know if you have any questions or concerns.    Thanks so much,  Tempie Hoist, BSN, RN, [employer]- Nurse Navigator   [facility] [place] [facility]  Cardiac Navigation  Phone: [phone]- [email or web address]  [address], [place], [place], [place] [address]

## 2022-08-21 ENCOUNTER — Encounter: Admit: 2022-08-21 | Discharge: 2022-08-21 | Payer: MEDICARE

## 2022-08-21 NOTE — Telephone Encounter
Patient's daughter in law, Angelique Blonder called to check if order/referral for injection had been faxed to [facility] in [place].  RN returned call and spoke to [place].  Order had been faxed on Monday, but RN will refax today.  Angelique Blonder requested to send fax to [phone]  which is  the scheduler at [facility]. Order faxed per request.

## 2022-09-03 ENCOUNTER — Encounter: Admit: 2022-09-03 | Discharge: 2022-09-03 | Payer: MEDICARE

## 2022-09-10 ENCOUNTER — Encounter: Admit: 2022-09-10 | Discharge: 2022-09-10 | Payer: MEDICARE

## 2022-09-10 MED ORDER — ATORVASTATIN 40 MG PO TAB
40 mg | ORAL_TABLET | Freq: Every day | ORAL | 3 refills | Status: AC
Start: 2022-09-10 — End: ?

## 2022-09-12 ENCOUNTER — Encounter: Admit: 2022-09-12 | Discharge: 2022-09-12 | Payer: MEDICARE

## 2022-09-15 ENCOUNTER — Encounter: Admit: 2022-09-15 | Discharge: 2022-09-15 | Payer: MEDICARE

## 2022-09-15 NOTE — Telephone Encounter
Jenna at scheduling is requesting a hold of eliquis for 3 days prior to a bone marrow biospy  [phone]

## 2022-09-18 ENCOUNTER — Encounter: Admit: 2022-09-18 | Discharge: 2022-09-18 | Payer: MEDICARE

## 2022-09-19 ENCOUNTER — Encounter: Admit: 2022-09-19 | Discharge: 2022-09-19 | Payer: MEDICARE

## 2022-09-19 MED ORDER — ENOXAPARIN 80 MG/0.8 ML SC SYRG
80 mg | SUBCUTANEOUS | 1 refills | 7.00000 days | Status: AC
Start: 2022-09-19 — End: ?

## 2022-09-19 NOTE — Telephone Encounter
-----   Message from Jonelle Sports, MD sent at 09/15/2022  6:08 PM CDT -----  Regarding: RE: Hold of Eliquis  He can hold Eliquis, but has permanent AF and will need to bridge with Lovenox, if possible with his decreased renal function.  ----- Message -----  From: Weston Brass  Sent: 09/15/2022   4:19 PM CDT  To: Julienne Kass, MD; Vanice Sarah, MD  Subject: Hold of Eliquis                                  Joel Hernandez from [facility] scheduling is requesting a 3 day hold of eliquis for a ct bone marrow biopsy.  Let me know if this is ok and if any bridging needed..    Thanks  Brett Canales

## 2022-09-19 NOTE — Telephone Encounter
Patient can hold eliquis x 3 days on the morning of day 3 patient will need 1mg /kg dose of lovenox the morning of the 3day with his procedure the next day.,

## 2022-09-30 ENCOUNTER — Encounter: Admit: 2022-09-30 | Discharge: 2022-09-30 | Payer: MEDICARE

## 2022-10-08 ENCOUNTER — Encounter: Admit: 2022-10-08 | Discharge: 2022-10-08 | Payer: MEDICARE

## 2022-10-16 ENCOUNTER — Encounter: Admit: 2022-10-16 | Discharge: 2022-10-16 | Payer: MEDICARE

## 2022-10-16 NOTE — Telephone Encounter
-----   Message from Jonelle Sports, MD sent at 10/16/2022  3:18 PM CDT -----  No  ----- Message -----  From: Lauralee Evener, RN  Sent: 10/16/2022   2:54 PM CDT  To: Vanice Sarah, MD    Dr. Barry Dienes,  Patient needs to hold eliquis for only 2 days, does he need to bridge?  Jaiyla Granados  ----- Message -----  From: Julienne Kass, MD  Sent: 10/16/2022   1:38 PM CDT  To: Lauralee Evener, RN    ok to interrupt prior to watchman  MAy need lovenox bridging since he has underlying permanent afib  Thanks  ----- Message -----  From: Lauralee Evener, RN  Sent: 10/16/2022  12:27 PM CDT  To: Julienne Kass, MD    Dr. Riley Nearing,  Would you please review note and advise if patient can hold eliquis for 2 days and if so how close to watchman can it be held.  Thank you,  Shawna Orleans, RN  ----- Message -----  From: Vanice Sarah, MD  Sent: 10/16/2022  12:17 PM CDT  To: Lauralee Evener, RN    Please ask PGU, thanks.  ----- Message -----  From: Lauralee Evener, RN  Sent: 10/16/2022  12:01 PM CDT  To: Vanice Sarah, MD    Dr. Barry Dienes,    [name] is scheduled to have a watchman on 11/19/22 by PGU. Dr. Isaac Bliss office is asking for permission to hold eliquis for 2 days prior to excision of mass on his buttock that is causing the patient great discomfort. This is not scheduled yet. Is patient able to hold eliquis for 2 days? Is there a certain period of time prior to the watchman the anticoagulation should not be interrupted? PGU is doing the watchman and I would can route to him if you prefer.   Horton Finer did hold his eliquis about 1.5 weeks ago for bone marrow biopsy you gave permission for.    Thank you,  Shawna Orleans, RN

## 2022-10-20 ENCOUNTER — Encounter: Admit: 2022-10-20 | Discharge: 2022-10-20 | Payer: MEDICARE

## 2022-10-24 ENCOUNTER — Encounter: Admit: 2022-10-24 | Discharge: 2022-10-24 | Payer: MEDICARE

## 2022-10-30 ENCOUNTER — Encounter: Admit: 2022-10-30 | Discharge: 2022-10-30 | Payer: MEDICARE

## 2022-11-06 NOTE — Progress Notes
Date of Service: 11/07/2022    Joel Hernandez is a 82 y.o. male patient of Dr. Barry Dienes with a PMH of permanent afib, mitral regurgitation s/p Mitraclip and surgicla aortic valve replacement, ESRD on PD, coronary artery disease s/p previous PCIs, and anemia. He is accompanied by his daughter. I am seeing him in the office today for H&P update prior to Watchman procedure.        HPI     Joel Hernandez was referred to Dr. Riley Nearing by his primary cardiologist Dr. Barry Dienes for consideration of LAAO with a watchman device given recent GI bleeds.  He recently experienced a hemoglobin drop to below 8.  He required a total of 6 units of PRBCs as well as iron infusions to stabilize his hemoglobin.  Given his elevated CHA2DS2-VASc score and elevated risk of bleeding per his HAS-BLED score, Dr. Riley Nearing recommended we proceed with watchman.    He presents today for an updated H&P prior to this procedure which is scheduled on 10/23. He does not have a contrast or iodine allergy. We reviewed pre-procedure instructions in detail. His son will be his driver to and from the procedure. He will have his pre-procedure labs drawn today.     He does have another procedure scheduled next week to have a skin lesion removed. We discussed that this should not affect his watchman procedure.     He denies chest pain, palpitations, shortness of breath, dizziness/lightheadedness, syncope, near syncope, orthopnea, paroxysmal nocturnal dyspnea, and lower extremity edema.         Vitals:    11/07/22 1255   BP: 115/68   BP Source: Arm, Left Upper   Pulse: 65   SpO2: 100%   O2 Device: None (Room air)   PainSc: Zero   Weight: 81.6 kg (180 lb)   Height: 182.9 cm (6')     Body mass index is 24.41 kg/m?Marland Kitchen     Past Medical History  Patient Active Problem List    Diagnosis Date Noted    Hematoma of left kidney 06/07/2020    CKD (chronic kidney disease) 05/02/2020    S/P mitral valve clip implantation 03/21/2020     03/21/20: Dr. Helen Hashimoto   04/25/2020 - ECHO:  The left ventricular systolic function is normal. The ejection fraction by Simpson's biplane method is 50%  The right ventricle is mildly dilated. The right ventricular systolic function is normal.  MitraClip X 1 with mild residual regurgitation present.  Bioprosthetic aortic valve present- gradient not assessed.  Moderate tricuspid valve regurgitation present.        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  Attempt at DC cardioversion unsuccessful.  DC'ed on amiodarone & Eliquis.    01/2016 - Successful outpatient cardioversion at [facility].  Amiodarone continued    02/2016 - Office follow-up--back in AF, asymptomatic with good rate control.      Hypertension 09/15/2013     BP medication started in mid-30's      Obstructive sleep apnea 09/15/2013     Circa 2004 - Diagnosis established, CPAP started (Dr. Mayford Knife, pulmonologist in [facility])           Review of Systems   Constitutional: Negative.   HENT: Negative.     Eyes: Negative.    Cardiovascular: Negative.    Respiratory: Negative.     Endocrine: Negative.    Hematologic/Lymphatic: Negative.    Skin: Negative.    Musculoskeletal: Negative.    Gastrointestinal: Negative.    Genitourinary: Negative.    Neurological: Negative.    Psychiatric/Behavioral: Negative.     Allergic/Immunologic: Negative.        Physical Exam  Gen: well appearing 82 year old male, in no acute distress  Eyes: sclera non-icteric, EOMs intact   Neck: no JVD, no carotid bruits auscultated  Lungs: CTA bilaterally without rales or rhonchi  Heart: RRR without murmur or gallop appreciated  Abdomen: soft, nontender, bowel sounds present  Extremities: no lower extremity edema, no cyanosis, pedal pulses are intact. Feet are warm, with preserved capillary refill.  Skin: warm and dry  Neurological: A&Ox3, no focal deficits noted  Psychiatric: calm, pleasant, and cooperative      Cardiovascular Health Factors  Vitals BP Readings from Last 3 Encounters:   11/07/22 115/68   08/18/22 118/55   07/30/22 133/77     Wt Readings from Last 3 Encounters:   11/07/22 81.6 kg (180 lb)   08/18/22 82.1 kg (181 lb)   07/30/22 85.5 kg (188 lb 6.4 oz)     BMI Readings from Last 3 Encounters:   11/07/22 24.41 kg/m?   08/18/22 24.55 kg/m?   07/30/22 25.55 kg/m?      Smoking Social History     Tobacco Use   Smoking Status Former    Current packs/day: 0.00    Average packs/day: 0.5 packs/day for 15.0 years (7.5 ttl pk-yrs)    Types: Cigarettes    Start date: 11/21/1954    Quit date: 11/20/1969    Years since quitting: 53.0   Smokeless Tobacco Never      Lipid Profile Cholesterol   Date Value Ref Range Status   02/21/2020 108  Final     HDL   Date Value Ref Range Status   02/21/2020 39 (L) >=40 Final     LDL   Date  Value Ref Range Status   02/21/2020 57  Final     Triglycerides   Date Value Ref Range Status   02/21/2020 62  Final      Blood Sugar Hemoglobin A1C   Date Value Ref Range Status   03/20/2020 5.4 4.0 - 6.0 % Final     Comment:     The ADA recommends that most patients with type 1 and type 2 diabetes maintain   an A1c level <7%.       Glucose   Date Value Ref Range Status   05/23/2022 116 (H) 70 - 105 Final   04/29/2022 92  Final   10/22/2021 85  Final     Glucose Fasting   Date Value Ref Range Status   05/29/2020 102  Final     Glucose, POC   Date Value Ref Range Status   08/03/2018 92 70 - 100 MG/DL Final   16/10/9602 96 70 - 100 MG/DL Final   54/09/8117 147 (H) 70 - 100 MG/DL Final          Problems Addressed Today  Encounter Diagnoses   Name Primary?    S/P mitral valve clip implantation Yes    S/P AVR (aortic valve replacement) and aortoplasty     Permanent atrial fibrillation (HCC)     Primary hypertension     Pure hypercholesterolemia     Coronary artery disease due to calcified coronary lesion     Chronic diastolic heart failure, NYHA class 3 (HCC)     Cardiovascular symptoms        Assessment and Plan     Atrial Fibrillation. Rate controlled with metoprolol tartrate, anticoagulated with Eliquis 2.5mg  BID. He will take his last dose of Eliquis on 10/22 AM. He will bridge with one dose of Lovenox on 10/22 PM. He will have his pre-procedure labs drawn today. Pending his labs, I see no reason why he cannot proceed with LAAO with watchman device as scheduled on 10/23 with Gunasekaran. He denies contrast/iodine allergy. Pre-procedure instructions were reviewed in detail and all of his questions were answered. Preliminary ECG today shows a v-paced rhythm with underlying afib.     Coronary artery disease. s/p previous PCIs to the LAD, circumflex, and rPDA. Denies anginal symptoms.    Hypertension. BP today 115/68. Goal BP < 130/80. Continue  metoprolol tartrate.      Hyperlipidemia. Last LDL 57. Goal < 70. Continue Lipitor 40mg  daily.     Chronic diastolic heart failure with preserved ejection fraction. He appears well compensated on exam; currently stage C, class II. His most recent LVEF is 65%.     Structural Heart Disease. S/p mitral clip in 2022, and open AVR in 2020.       Follow Up: Following watchman procedure.          Current Medications (including today's revisions)   apixaban (ELIQUIS) 2.5 mg tablet Take one tablet by mouth twice daily.    atorvastatin (LIPITOR) 40 mg tablet TAKE 1 TABLET BY MOUTH EVERY DAY    calcium carbonate (OS-CAL) 500 mg calcium (1,250 mg) chewable tablet Chew one tablet by mouth daily.    cholecalciferol (vitamin D3) (VITAMIN D3) 1,000 units tablet Take one tablet by mouth daily.    COPPER PO Take  by mouth daily.    cyanocobalamin (vitamin B-12) 1,000 mcg tablet Take one tablet by mouth daily.    enoxaparin (LOVENOX) 80 mg syringe Inject 0.8 mL under the skin As Prescribed (On Call from  Rx) for 999 doses. As directed take in morning of the day before the biopsy.    [START ON 11/18/2022] enoxaparin (LOVENOX) 80 mg syringe Inject 0.8 mL under the skin every 12 hours for 1 dose. Inject the night before your procedure when you are holding your oral anticoagulation medication    ferrous sulfate (FEOSOL) 325 mg (65 mg iron) tablet Take one tablet by mouth daily.    finasteride (PROSCAR) 5 mg tablet TAKE 1 TABLET BY MOUTH EVERY DAY    folic acid (FOLVITE) 1 mg tablet TAKE 1 TABLET BY MOUTH DAILY FOR FOLIC ACID DEFICIENCY    HYDROcodone/acetaminophen (NORCO) 5/325 mg tablet Take one tablet by mouth every 8 hours as needed for Pain.    methIMAzole (TAPAZOLE) 5 mg tablet Take one tablet by mouth daily.    metoprolol tartrate (LOPRESSOR) 50 mg tablet Take one tablet by mouth twice daily. Hold for HR < 55 and/or SBP<110    nitroglycerin (NITROSTAT) 0.4 mg tablet as Needed.    ondansetron (ZOFRAN ODT) 4 mg rapid dissolve tablet Dissolve one tablet by mouth as Needed.    pantoprazole DR (PROTONIX) 40 mg tablet TAKE 1 TABLET BY MOUTH EVERY DAY FOR GERD    potassium chloride (K-TAB) 20 mEq tablet Take one tablet by mouth daily.    tadalafiL (CIALIS) 20 mg tablet Take one tablet by mouth as Needed.    tamsulosin (FLOMAX) 0.4 mg capsule Take one capsule by mouth daily.       Kathryne Sharper, APRN-BC  Interventional Cardiology  Pager [phone] - Available on Voalte

## 2022-11-07 ENCOUNTER — Ambulatory Visit: Admit: 2022-11-07 | Discharge: 2022-11-07 | Payer: MEDICARE

## 2022-11-07 ENCOUNTER — Encounter: Admit: 2022-11-07 | Discharge: 2022-11-07 | Payer: MEDICARE

## 2022-11-07 DIAGNOSIS — I10 Essential (primary) hypertension: Secondary | ICD-10-CM

## 2022-11-07 DIAGNOSIS — Z952 Presence of prosthetic heart valve: Secondary | ICD-10-CM

## 2022-11-07 DIAGNOSIS — I2510 Atherosclerotic heart disease of native coronary artery without angina pectoris: Secondary | ICD-10-CM

## 2022-11-07 DIAGNOSIS — M549 Dorsalgia, unspecified: Secondary | ICD-10-CM

## 2022-11-07 DIAGNOSIS — E785 Hyperlipidemia, unspecified: Secondary | ICD-10-CM

## 2022-11-07 DIAGNOSIS — E7800 Pure hypercholesterolemia, unspecified: Secondary | ICD-10-CM

## 2022-11-07 DIAGNOSIS — G4733 Obstructive sleep apnea (adult) (pediatric): Secondary | ICD-10-CM

## 2022-11-07 DIAGNOSIS — I214 Non-ST elevation (NSTEMI) myocardial infarction: Secondary | ICD-10-CM

## 2022-11-07 DIAGNOSIS — I350 Nonrheumatic aortic (valve) stenosis: Secondary | ICD-10-CM

## 2022-11-07 DIAGNOSIS — C44621 Squamous cell carcinoma of skin of unspecified upper limb, including shoulder: Secondary | ICD-10-CM

## 2022-11-07 DIAGNOSIS — R011 Cardiac murmur, unspecified: Secondary | ICD-10-CM

## 2022-11-07 DIAGNOSIS — I4821 Permanent atrial fibrillation: Secondary | ICD-10-CM

## 2022-11-07 DIAGNOSIS — N179 Acute kidney failure, unspecified: Secondary | ICD-10-CM

## 2022-11-07 DIAGNOSIS — Z9889 Other specified postprocedural states: Secondary | ICD-10-CM

## 2022-11-07 DIAGNOSIS — I4891 Unspecified atrial fibrillation: Secondary | ICD-10-CM

## 2022-11-07 DIAGNOSIS — I5032 Chronic diastolic (congestive) heart failure: Secondary | ICD-10-CM

## 2022-11-07 DIAGNOSIS — E079 Disorder of thyroid, unspecified: Secondary | ICD-10-CM

## 2022-11-07 DIAGNOSIS — R0989 Other specified symptoms and signs involving the circulatory and respiratory systems: Secondary | ICD-10-CM

## 2022-11-07 DIAGNOSIS — C4490 Unspecified malignant neoplasm of skin, unspecified: Secondary | ICD-10-CM

## 2022-11-07 DIAGNOSIS — I724 Aneurysm of artery of lower extremity: Secondary | ICD-10-CM

## 2022-11-07 LAB — CBC
HEMATOCRIT: 39 % — ABNORMAL LOW (ref 40–50)
HEMOGLOBIN: 12 g/dL — ABNORMAL LOW (ref 13.5–16.5)
MCH: 31 pg (ref 26–34)
MCHC: 32 g/dL (ref 32.0–36.0)
MCV: 97 FL (ref 80–100)
MPV: 9.4 FL (ref 7–11)
PLATELET COUNT: 151 10*3/uL (ref 150–400)
RBC COUNT: 4 M/UL — ABNORMAL LOW (ref 4.4–5.5)
RDW: 15 % — ABNORMAL HIGH (ref 11–15)
WBC COUNT: 5.5 10*3/uL (ref 4.5–11.0)

## 2022-11-07 MED ORDER — APIXABAN 2.5 MG PO TAB
2.5 mg | ORAL_TABLET | Freq: Two times a day (BID) | ORAL | 3 refills | Status: AC
Start: 2022-11-07 — End: ?

## 2022-11-10 NOTE — H&P (View-Only)
History and Physical Update Note    Reason for admission: Permanent atrial fibrillation    Planned procedure: percutaneous closure of left atrial appendage with endocardial implant    Allergies:  Lisinopril    Lab/Radiology/Other Diagnostic Tests:  Hematology:    Lab Results   Component Value Date    HGB 12.8 11/07/2022    HCT 39.3 11/07/2022    PLTCT 151 11/07/2022    WBC 5.5 11/07/2022    NEUT 88 05/04/2020    ANC 10.65 05/04/2020    ALC 0.77 05/04/2020    MONA 6 05/04/2020    AMC 0.74 05/04/2020    EOSA 0 05/04/2020    ABC 0.02 05/04/2020    MCV 97.3 11/07/2022    MCH 31.6 11/07/2022    MCHC 32.5 11/07/2022    MPV 9.4 11/07/2022    RDW 15.9 11/07/2022    and General Chemistry:    Lab Results   Component Value Date    NA 139 11/07/2022    K 5.0 11/07/2022    CL 97 11/07/2022    CO2 24 11/07/2022    GAP 18 11/07/2022    BUN 49 11/07/2022    CR 8.68 11/07/2022    GLU 79 11/07/2022    CA 9.0 11/07/2022    ALBUMIN 2.0 04/29/2022    LACTIC 2.6 07/25/2018    OBSCA 1.63 07/25/2018    MG 1.5 11/07/2022    TOTBILI 0.54 04/29/2022    PO4 4.5 12/12/2020       I have examined the patient, and the following changes are noted from the previous H&P performed on 11/06/2022.    He reports his skin lesion excision went well, and he had no complications. He just met with his nephrologist last week. His last dose of his Eliquis was yesterday morning, 10/22. He used one dose of Lovenox last night but nothing this morning.     Jennell Corner, APRN-NP  Interventional Cardiology  Pager [phone] - Available on Voalte    --------------------------------------------------------------------------------------------------------------------------------------------    Progress Notes  Jennell Corner, APRN-NP (Nurse Practitioner)  Nurse Practitioner, Family  Creation Time: 11/06/22 1038  Signed  Expand All Collapse All  Date of Service: 11/07/2022     Joel Hernandez is a 82 y.o. male patient of Dr. Barry Dienes with a PMH of permanent afib, mitral regurgitation s/p Mitraclip and surgicla aortic valve replacement, ESRD on PD, coronary artery disease s/p previous PCIs, and anemia. He is accompanied by his daughter. I am seeing him in the office today for H&P update prior to Watchman procedure.         HPI     Subjective  Joel Hernandez was referred to Dr. Riley Nearing by his primary cardiologist Dr. Barry Dienes for consideration of LAAO with a watchman device given recent GI bleeds.  He recently experienced a hemoglobin drop to below 8.  He required a total of 6 units of PRBCs as well as iron infusions to stabilize his hemoglobin.  Given his elevated CHA2DS2-VASc score and elevated risk of bleeding per his HAS-BLED score, Dr. Riley Nearing recommended we proceed with watchman.     He presents today for an updated H&P prior to this procedure which is scheduled on 10/23. He does not have a contrast or iodine allergy. We reviewed pre-procedure instructions in detail. His son will be his driver to and from the procedure. He will have his pre-procedure labs drawn today.      He does have another procedure scheduled next  week to have a skin lesion removed. We discussed that this should not affect his watchman procedure.      He denies chest pain, palpitations, shortness of breath, dizziness/lightheadedness, syncope, near syncope, orthopnea, paroxysmal nocturnal dyspnea, and lower extremity edema.                 Vitals:     11/07/22 1255   BP: 115/68   BP Source: Arm, Left Upper   Pulse: 65   SpO2: 100%   O2 Device: None (Room air)   PainSc: Zero   Weight: 81.6 kg (180 lb)   Height: 182.9 cm (6')      Body mass index is 24.41 kg/m?Marland Kitchen      Past Medical History        Patient Active Problem List     Diagnosis Date Noted    Hematoma of left kidney 06/07/2020    CKD (chronic kidney disease) 05/02/2020    S/P mitral valve clip implantation 03/21/2020       03/21/20: Dr. Helen Hashimoto   04/25/2020 - ECHO:  The left ventricular systolic function is normal. The ejection fraction by Simpson's biplane method is 50%  The right ventricle is mildly dilated. The right ventricular systolic function is normal.  MitraClip X 1 with mild residual regurgitation present.  Bioprosthetic aortic valve present- gradient not assessed.  Moderate tricuspid valve regurgitation present.          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  Attempt at DC cardioversion unsuccessful.  DC'ed on amiodarone & Eliquis.     01/2016 - Successful outpatient cardioversion at [facility].  Amiodarone continued     02/2016 - Office follow-up--back  in AF, asymptomatic with good rate control.       Hypertension 09/15/2013       BP medication started in mid-30's       Obstructive sleep apnea 09/15/2013       Circa 2004 - Diagnosis established, CPAP started (Dr. Mayford Knife, pulmonologist in [facility])            Allergies   Allergen Reactions    Lisinopril SHORTNESS OF BREATH     Surgical History:   Procedure Laterality Date    LUMBAR LAMINECTOMY  2004    L4-5    LUMBAR LAMINECTOMY  2011    Hemilaminectomies L 2-3 L3-4    RIGHT LUMBAR 4-5 REDO LAMINECTOMY AND LATERAL RECESS DECOMPRESSION Right 12/04/2016    Performed by Theotis Barrio, MD at [facility] OR    LAMINECTOMY/ FACETECTOMY/ FORAMINOTOMY WITH DECOMPRESSION - 1 VERTEBRAL SEGMENT - LUMBAR  12/04/2016    Performed by Theotis Barrio, MD at [facility] OR    CORONARY STENT PLACEMENT  12/2016    KNEE REPLACEMENT Right 08/2017    ANGIOGRAPHY CORONARY ARTERY WITH LEFT HEART CATHETERIZATION N/A 05/24/2018    Performed by Marcell Barlow, MD at [facility] CATH LAB    POSSIBLE PERCUTANEOUS CORONARY STENT PLACEMENT WITH ANGIOPLASTY N/A 05/24/2018    Performed by Marcell Barlow, MD at [facility] CATH LAB    CLOSURE PSEUDOANEURYSM BY INJECTION- LOWER EXTREMITY N/A 05/26/2018    Performed by Marcell Barlow, MD at [facility] CATH LAB    CORONARY STENT PLACEMENT  05/2018    x2    PACEMAKER INSERTION  06/2018    Aortic Root replacement (Freestyle)/Implantation of coronary artery buttons, Circ arrest N/A 07/21/2018    Performed by Earlene Plater (Trip) L III, MD at [facility] CVOR    INSERTION/ REPLACEMENT PERMANENT PACEMAKER WITH ATRIAL AND VENTRICULAR LEAD Left 07/23/2018    Performed by Lorain Childes, MD at [facility] EP LAB    FLUOROSCOPY - CARDIAC  07/23/2018    Performed by Lorain Childes, MD at [facility] EP LAB    EXPLORATION POSTOPERATIVE WOUND - CHEST N/A 07/24/2018    Performed by Earlene Plater (Trip) L III, MD at [facility] CVOR    MITRAL VALVE SURGERY  03/21/2020    mitral clip per patient Percutaneous Mitral Valve Repair with MitraClip/TEE N/A 03/21/2020    Performed by Arby Barrette, MD at [facility] CVOR    COLONOSCOPY      HEART CATHETERIZATION  01/2016, 05/2018    Stent x 1 LAD placement following NSTEMI, 2 stents placed 04/2018    HX HEART CATHETERIZATION      SKIN CANCER EXCISION  2017, 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   HENT: Negative.     Eyes: Negative.    Cardiovascular: Negative.    Respiratory: Negative.     Endocrine: Negative.    Hematologic/Lymphatic: Negative.    Skin: Negative.    Musculoskeletal: Negative.    Gastrointestinal: Negative.    Genitourinary: Negative.    Neurological: Negative.    Psychiatric/Behavioral: Negative.     Allergic/Immunologic: Negative.          Physical Exam  Gen: well appearing 82 year old male, in no acute distress  Eyes: sclera non-icteric, EOMs intact   Neck: no JVD, no carotid bruits auscultated  Lungs: CTA bilaterally without rales or rhonchi  Heart: RRR without murmur or gallop appreciated  Abdomen: soft, nontender, bowel sounds present  Extremities: no lower extremity edema, no cyanosis, pedal pulses are intact. Feet are warm, with preserved capillary refill.  Skin: warm and dry  Neurological: A&Ox3, no focal deficits noted  Psychiatric: calm, pleasant, and cooperative        Cardiovascular Health Factors  Vitals     BP Readings from Last 3 Encounters:   11/07/22 115/68   08/18/22 118/55 07/30/22 133/77          Wt Readings from Last 3 Encounters:   11/07/22 81.6 kg (180 lb)   08/18/22 82.1 kg (181 lb)   07/30/22 85.5 kg (188 lb 6.4 oz)          BMI Readings from Last 3 Encounters:   11/07/22 24.41 kg/m?   08/18/22 24.55 kg/m?   07/30/22 25.55 kg/m?      Smoking Tobacco Use History   Social History           Tobacco Use   Smoking Status Former    Current packs/day: 0.00    Average packs/day: 0.5 packs/day for 15.0 years (7.5 ttl pk-yrs)    Types: Cigarettes    Start date: 11/21/1954    Quit date: 11/20/1969    Years since quitting: 53.0   Smokeless Tobacco Never         Lipid Profile       Cholesterol   Date Value Ref Range Status   02/21/2020 108   Final            HDL   Date Value Ref Range Status   02/21/2020 39 (L) >=40 Final            LDL   Date Value Ref Range Status   02/21/2020 57   Final            Triglycerides   Date Value Ref Range Status   02/21/2020 62   Final      Blood Sugar         Hemoglobin A1C   Date Value Ref Range Status   03/20/2020 5.4 4.0 - 6.0 % Final       Comment:       The ADA recommends that most patients with type 1 and type 2 diabetes maintain   an A1c level <7%.               Glucose   Date Value Ref Range Status   05/23/2022 116 (H) 70 - 105 Final   04/29/2022 92   Final   10/22/2021 85   Final            Glucose Fasting   Date Value Ref Range Status   05/29/2020 102   Final  Glucose, POC   Date Value Ref Range Status   08/03/2018 92 70 - 100 MG/DL Final   08/65/7846 96 70 - 100 MG/DL Final   96/29/5284 132 (H) 70 - 100 MG/DL Final            Problems Addressed Today       Encounter Diagnoses   Name Primary?    S/P mitral valve clip implantation Yes    S/P AVR (aortic valve replacement) and aortoplasty      Permanent atrial fibrillation (HCC)      Primary hypertension      Pure hypercholesterolemia      Coronary artery disease due to calcified coronary lesion      Chronic diastolic heart failure, NYHA class 3 (HCC)      Cardiovascular symptoms Assessment and Plan     Assessment & Plan  Atrial Fibrillation. Rate controlled with metoprolol tartrate, anticoagulated with Eliquis 2.5mg  BID. He will take his last dose of Eliquis on 10/22 AM. He will bridge with one dose of Lovenox on 10/22 PM. He will have his pre-procedure labs drawn today. Pending his labs, I see no reason why he cannot proceed with LAAO with watchman device as scheduled on 10/23 with Gunasekaran. He denies contrast/iodine allergy. Pre-procedure instructions were reviewed in detail and all of his questions were answered. Preliminary ECG today shows a v-paced rhythm with underlying afib.      Coronary artery disease. s/p previous PCIs to the LAD, circumflex, and rPDA. Denies anginal symptoms.     Hypertension. BP today 115/68. Goal BP < 130/80. Continue  metoprolol tartrate.       Hyperlipidemia. Last LDL 57. Goal < 70. Continue Lipitor 40mg  daily.      Chronic diastolic heart failure with preserved ejection fraction. He appears well compensated on exam; currently stage C, class II. His most recent LVEF is 65%.      Structural Heart Disease. S/p mitral clip in 2022, and open AVR in 2020.         Follow Up: Following watchman procedure.         Current Medications (including today's revisions)   apixaban (ELIQUIS) 2.5 mg tablet Take one tablet by mouth twice daily.    atorvastatin (LIPITOR) 40 mg tablet TAKE 1 TABLET BY MOUTH EVERY DAY    calcium carbonate (OS-CAL) 500 mg calcium (1,250 mg) chewable tablet Chew one tablet by mouth daily.    cholecalciferol (vitamin D3) (VITAMIN D3) 1,000 units tablet Take one tablet by mouth daily.    COPPER PO Take  by mouth daily.    cyanocobalamin (vitamin B-12) 1,000 mcg tablet Take one tablet by mouth daily.    enoxaparin (LOVENOX) 80 mg syringe Inject 0.8 mL under the skin As Prescribed (On Call from Rx) for 999 doses. As directed take in morning of the day before the biopsy.    [START ON 11/18/2022] enoxaparin (LOVENOX) 80 mg syringe Inject 0.8 mL under the skin every 12 hours for 1 dose. Inject the night before your procedure when you are holding your oral anticoagulation medication    ferrous sulfate (FEOSOL) 325 mg (65 mg iron) tablet Take one tablet by mouth daily.    finasteride (PROSCAR) 5 mg tablet TAKE 1 TABLET BY MOUTH EVERY DAY    folic acid (FOLVITE) 1 mg tablet TAKE 1 TABLET BY MOUTH DAILY FOR FOLIC ACID DEFICIENCY    HYDROcodone/acetaminophen (NORCO) 5/325 mg tablet Take one tablet by mouth every 8 hours as needed for Pain.  methIMAzole (TAPAZOLE) 5 mg tablet Take one tablet by mouth daily.    metoprolol tartrate (LOPRESSOR) 50 mg tablet Take one tablet by mouth twice daily. Hold for HR < 55 and/or SBP<110    nitroglycerin (NITROSTAT) 0.4 mg tablet as Needed.    ondansetron (ZOFRAN ODT) 4 mg rapid dissolve tablet Dissolve one tablet by mouth as Needed.    pantoprazole DR (PROTONIX) 40 mg tablet TAKE 1 TABLET BY MOUTH EVERY DAY FOR GERD    potassium chloride (K-TAB) 20 mEq tablet Take one tablet by mouth daily.    tadalafiL (CIALIS) 20 mg tablet Take one tablet by mouth as Needed.    tamsulosin (FLOMAX) 0.4 mg capsule Take one capsule by mouth daily.         Kathryne Sharper, APRN-BC  Interventional Cardiology  Pager [phone] - Available on Voalte

## 2022-11-18 ENCOUNTER — Encounter: Admit: 2022-11-18 | Discharge: 2022-11-18 | Payer: MEDICARE

## 2022-11-19 ENCOUNTER — Inpatient Hospital Stay: Admit: 2022-11-19 | Discharge: 2022-11-19 | Disposition: A | Discharge status: Home / Self Care | Payer: MEDICARE

## 2022-11-19 ENCOUNTER — Encounter: Admit: 2022-11-19 | Discharge: 2022-11-19 | Payer: MEDICARE

## 2022-11-19 ENCOUNTER — Inpatient Hospital Stay: Admit: 2022-11-19 | Discharge: 2022-11-19 | Payer: MEDICARE

## 2022-11-19 DIAGNOSIS — Z006 Encounter for examination for normal comparison and control in clinical research program: Secondary | ICD-10-CM

## 2022-11-19 DIAGNOSIS — I10 Essential (primary) hypertension: Secondary | ICD-10-CM

## 2022-11-19 DIAGNOSIS — Z950 Presence of cardiac pacemaker: Secondary | ICD-10-CM

## 2022-11-19 DIAGNOSIS — I4821 Permanent atrial fibrillation: Secondary | ICD-10-CM

## 2022-11-19 DIAGNOSIS — D649 Anemia, unspecified: Secondary | ICD-10-CM

## 2022-11-19 DIAGNOSIS — N186 End stage renal disease: Secondary | ICD-10-CM

## 2022-11-19 DIAGNOSIS — C44621 Squamous cell carcinoma of skin of unspecified upper limb, including shoulder: Secondary | ICD-10-CM

## 2022-11-19 DIAGNOSIS — E785 Hyperlipidemia, unspecified: Secondary | ICD-10-CM

## 2022-11-19 DIAGNOSIS — I350 Nonrheumatic aortic (valve) stenosis: Secondary | ICD-10-CM

## 2022-11-19 DIAGNOSIS — I2510 Atherosclerotic heart disease of native coronary artery without angina pectoris: Secondary | ICD-10-CM

## 2022-11-19 DIAGNOSIS — G4733 Obstructive sleep apnea (adult) (pediatric): Secondary | ICD-10-CM

## 2022-11-19 DIAGNOSIS — I513 Intracardiac thrombosis, not elsewhere classified: Secondary | ICD-10-CM

## 2022-11-19 DIAGNOSIS — Z992 Dependence on renal dialysis: Secondary | ICD-10-CM

## 2022-11-19 DIAGNOSIS — Z79899 Other long term (current) drug therapy: Secondary | ICD-10-CM

## 2022-11-19 DIAGNOSIS — I2584 Coronary atherosclerosis due to calcified coronary lesion: Secondary | ICD-10-CM

## 2022-11-19 DIAGNOSIS — M549 Dorsalgia, unspecified: Secondary | ICD-10-CM

## 2022-11-19 DIAGNOSIS — Z888 Allergy status to other drugs, medicaments and biological substances status: Secondary | ICD-10-CM

## 2022-11-19 DIAGNOSIS — E7800 Pure hypercholesterolemia, unspecified: Secondary | ICD-10-CM

## 2022-11-19 DIAGNOSIS — I252 Old myocardial infarction: Secondary | ICD-10-CM

## 2022-11-19 DIAGNOSIS — I724 Aneurysm of artery of lower extremity: Secondary | ICD-10-CM

## 2022-11-19 DIAGNOSIS — Z95818 Presence of other cardiac implants and grafts: Secondary | ICD-10-CM

## 2022-11-19 DIAGNOSIS — Z952 Presence of prosthetic heart valve: Secondary | ICD-10-CM

## 2022-11-19 DIAGNOSIS — N179 Acute kidney failure, unspecified: Secondary | ICD-10-CM

## 2022-11-19 DIAGNOSIS — C4490 Unspecified malignant neoplasm of skin, unspecified: Secondary | ICD-10-CM

## 2022-11-19 DIAGNOSIS — Z539 Procedure and treatment not carried out, unspecified reason: Secondary | ICD-10-CM

## 2022-11-19 DIAGNOSIS — I5032 Chronic diastolic (congestive) heart failure: Secondary | ICD-10-CM

## 2022-11-19 DIAGNOSIS — I4891 Unspecified atrial fibrillation: Secondary | ICD-10-CM

## 2022-11-19 DIAGNOSIS — Z7901 Long term (current) use of anticoagulants: Secondary | ICD-10-CM

## 2022-11-19 DIAGNOSIS — Z8249 Family history of ischemic heart disease and other diseases of the circulatory system: Secondary | ICD-10-CM

## 2022-11-19 DIAGNOSIS — E079 Disorder of thyroid, unspecified: Secondary | ICD-10-CM

## 2022-11-19 DIAGNOSIS — Z96651 Presence of right artificial knee joint: Secondary | ICD-10-CM

## 2022-11-19 DIAGNOSIS — Z87891 Personal history of nicotine dependence: Secondary | ICD-10-CM

## 2022-11-19 DIAGNOSIS — R011 Cardiac murmur, unspecified: Secondary | ICD-10-CM

## 2022-11-19 DIAGNOSIS — I132 Hypertensive heart and chronic kidney disease with heart failure and with stage 5 chronic kidney disease, or end stage renal disease: Secondary | ICD-10-CM

## 2022-11-19 DIAGNOSIS — Z85828 Personal history of other malignant neoplasm of skin: Secondary | ICD-10-CM

## 2022-11-19 DIAGNOSIS — I214 Non-ST elevation (NSTEMI) myocardial infarction: Secondary | ICD-10-CM

## 2022-11-19 DIAGNOSIS — Z955 Presence of coronary angioplasty implant and graft: Secondary | ICD-10-CM

## 2022-11-19 LAB — LIMITED ECHO
BSA: 1.9 m2
ECHO EF: 60 %
RA PRESSURE: 3
RV SYSTOLIC PRESSURE: 27
TR PEAK VELOCITY: 2.6 m/s
TV REST PULMONARY ARTERY PRESSURE: 30 mmHg

## 2022-11-19 LAB — CBC
HEMATOCRIT: 39 % — ABNORMAL LOW (ref 40–50)
MCH: 31 pg — ABNORMAL HIGH (ref 26–34)
MCV: 95 FL (ref 80–100)
MPV: 9.5 FL (ref 7–11)
PLATELET COUNT: 118 10*3/uL — ABNORMAL LOW (ref 150–400)
WBC COUNT: 5.4 10*3/uL (ref 4.5–11.0)

## 2022-11-19 LAB — BASIC METABOLIC PANEL
BLD UREA NITROGEN: 63 mg/dL — ABNORMAL HIGH (ref 7–25)
EGFR: 5 mL/min — ABNORMAL LOW (ref >60)
GLUCOSE,PANEL: 73 mg/dL (ref 70–100)
POTASSIUM: 4.3 MMOL/L — ABNORMAL LOW (ref 3.5–5.1)
SODIUM: 136 MMOL/L — ABNORMAL LOW (ref 137–147)

## 2022-11-19 MED ORDER — ACETAMINOPHEN 325 MG PO TAB
650 mg | ORAL | 0 refills | Status: DC | PRN
Start: 2022-11-19 — End: 2022-11-19

## 2022-11-19 MED ORDER — CEFAZOLIN 2 GRAM IV SOLR
2 g | Freq: Once | INTRAVENOUS | 0 refills | Status: DC
Start: 2022-11-19 — End: 2022-11-19

## 2022-11-19 MED ORDER — MAGNESIUM HYDROXIDE 400 MG/5 ML PO SUSP
30 mL | ORAL | 0 refills | Status: DC | PRN
Start: 2022-11-19 — End: 2022-11-19

## 2022-11-19 MED ORDER — HYDROCODONE-ACETAMINOPHEN 5-325 MG PO TAB
1 | Freq: Once | ORAL | 0 refills | Status: CP
Start: 2022-11-19 — End: ?
  Administered 2022-11-19: 20:00:00 1 via ORAL

## 2022-11-19 MED ORDER — CEFAZOLIN INJ 1GM IVP
1 g | INTRAVENOUS | 0 refills | Status: DC
Start: 2022-11-19 — End: 2022-11-19

## 2022-11-19 MED ORDER — SODIUM CHLORIDE 0.9% IV SOLP
250 mL | INTRAVENOUS | 0 refills | Status: DC
Start: 2022-11-19 — End: 2022-11-19

## 2022-11-19 MED ORDER — ASPIRIN 81 MG PO TBEC
81 mg | Freq: Every day | ORAL | 0 refills | Status: AC
Start: 2022-11-19 — End: ?

## 2022-11-19 MED ORDER — ASPIRIN 325 MG PO TAB
325 mg | Freq: Once | ORAL | 0 refills | Status: CP
Start: 2022-11-19 — End: ?
  Administered 2022-11-19: 13:00:00 325 mg via ORAL

## 2022-11-19 MED ORDER — ALUMINUM-MAGNESIUM HYDROXIDE 200-200 MG/5 ML PO SUSP
30 mL | ORAL | 0 refills | Status: DC | PRN
Start: 2022-11-19 — End: 2022-11-19

## 2022-11-19 NOTE — Progress Notes
Patient discharged to home with all belongings.  Discharge instructions, med reconciliation and home wound care instructions given and explained to patient and family both verbally and written.  Accompanied by RN.  No complaints of pain or discomfort.   Right femoral  remains clean, dry, and intact with no evidence of a hematoma after ambulation.  Patient escorted to lobby via Family.  Patient to follow up with [facility]) or on-call physician with any additional questions or concerns.  All contact numbers provided.  Patient and family acceptant of DC instuctions and report understanding to all information.

## 2022-11-19 NOTE — Discharge Instructions - Pharmacy
Discharge Summary      Name: Joel Hernandez  Medical Record Number: [id number]        Account Number:  [id number]  Date Of Birth:  06-09-1940                         Age:  82 y.o.  Admit date:  11/19/2022                     Discharge date: 11/19/2022      Discharge Attending:  Jonell Cluck, MD  Discharge Summary Completed By: Jennell Corner, APRN-NP    Service: Cardiology-Interventional    Reason for hospitalization:  Permanent atrial fibrillation [facility]) [I48.21]    Primary Discharge Diagnosis:   Same as Above    Hospital Diagnoses:  Hospital Problems        Active Problems    * (Principal) Permanent atrial fibrillation (HCC)    Hypertension    Coronary artery disease due to calcified coronary lesion    HLD (hyperlipidemia)    S/P AVR (aortic valve replacement) and aortoplasty    Chronic diastolic heart failure, NYHA class 3 (HCC)    S/P mitral valve clip implantation    ESRD (end stage renal disease) (HCC)     Present on Admission:   Permanent atrial fibrillation (HCC)   S/P mitral valve clip implantation   S/P AVR (aortic valve replacement) and aortoplasty   HLD (hyperlipidemia)   Coronary artery disease due to calcified coronary lesion   Chronic diastolic heart failure, NYHA class 3 (HCC)   Hypertension   ESRD (end stage renal disease) (HCC)        Significant Past Medical History        AKI (acute kidney injury) (HCC)      Comment:  Elevated Creatine  Aortic stenosis  Atrial fibrillation (HCC)  Back pain  CAD (coronary artery disease)  Disorder of thyroid gland  Heart murmur  Hyperlipidemia  Hypertension  NSTEMI (non-ST elevated myocardial infarction) (HCC)      Comment:  s/p stent placement  OSA on CPAP      Comment:  No CPAP use after 55lb weight loss  Pseudoaneurysm of right femoral artery (HCC)      Comment:  04/2018-found incidentally on CTA but confirmed by groin                duplex  SCC (squamous cell carcinoma), arm  Severe aortic stenosis  Skin cancer    Allergies   Lisinopril    Brief Hospital Course   The patient was admitted and the following issues were addressed during this hospitalization: (with pertinent details including admission exam/imaging/labs).      Joel Hernandez is an 82 year old man with a PMH of permanent afib, mitral regurgitation s/p Mitraclip and surgicla aortic valve replacement, ESRD on PD, coronary artery disease s/p previous PCIs, and anemia.  He was evaluated by Dr. Riley Nearing and felt to be an appropriate candidate for LAAO with a Watchman device.    He was admitted for implantation of a  Watchman LAA occlusion device for GI bleeding.  Unfortunately, on the preprocedure echocardiogram, a large mobile thrombus was noted in the apex of his left atrial appendage.  The procedure was aborted.  He will continue Eliquis 2.5 mg twice a day, as well as start aspirin 81 mg daily.  We will plan to repeat a TEE in 3 months and for surveillance and  discuss LAAO at that time. A limited echocardiogram was obtained which showed no pericardial effusion.    Items Needing Follow Up   Pending items or areas that need to be addressed at follow up:   - TEE in 3 months for thrombus surveillance    Pending Labs and Follow Up Radiology    Pending labs and/or radiology review at this time of discharge are listed below: if this area is blank, there are no items for review.   Pending Labs       Order Current Status    BASIC METABOLIC PANEL In process              Medications      Medication List      START taking these medications     aspirin EC 81 mg tablet; Commonly known as: ASPIR-LOW; Dose: 81 mg; Take   one tablet by mouth daily.; Refills: 0     CONTINUE taking these medications     apixaban 2.5 mg tablet; Commonly known as: ELIQUIS; Dose: 2.5 mg; Take   one tablet by mouth twice daily.; Quantity: 180 tablet; Refills: 3   atorvastatin 40 mg tablet; Commonly known as: LIPITOR; Dose: 40 mg; TAKE   1 TABLET BY MOUTH EVERY DAY; Quantity: 90 tablet; Refills: 3   calcium carbonate 500 mg calcium (1,250 mg) chewable tablet; Commonly   known as: OS-CAL; Dose: 1 tablet; Refills: 0   CHOLEcalciferoL (vitamin D3) 1,000 units tablet; Dose: 1,000 Units;   Refills: 0   COPPER PO; Refills: 0   cyanocobalamin (vitamin B-12) 1,000 mcg tablet; Dose: 1,000 mcg;   Refills: 0   ferrous sulfate 325 mg (65 mg iron) tablet; Commonly known as: FEOSOL;   Dose: 325 mg; Refills: 0   finasteride 5 mg tablet; Commonly known as: PROSCAR; TAKE 1 TABLET BY   MOUTH EVERY DAY; Quantity: 90 tablet; Refills: 0   folic acid 1 mg tablet; Commonly known as: FOLVITE; Refills: 0   HYDROcodone/acetaminophen 5/325 mg tablet; Commonly known as: NORCO;   Dose: 1 tablet; Refills: 0   methIMAzole 5 mg tablet; Commonly known as: TAPAZOLE; Dose: 5 mg;   Refills: 0   metoprolol tartrate 50 mg tablet; Commonly known as: LOPRESSOR; Dose: 50   mg; Take one tablet by mouth twice daily. Hold for HR < 55 and/or SBP<110;   Quantity: 180 tablet; Refills: 3   nitroglycerin 0.4 mg tablet; Commonly known as: NITROSTAT; Refills: 0   ondansetron 4 mg rapid dissolve tablet; Commonly known as: ZOFRAN ODT;   Dose: 1 tablet; Refills: 0   pantoprazole DR 40 mg tablet; Commonly known as: PROTONIX; Refills: 0   potassium chloride 20 mEq tablet; Commonly known as: K-TAB; Dose: 20   mEq; Refills: 0   tadalafiL 20 mg tablet; Commonly known as: CIALIS; Dose: 1 tablet;   Refills: 0   tamsulosin 0.4 mg capsule; Commonly known as: FLOMAX; Dose: 1 capsule;   Refills: 0     STOP taking these medications     enoxaparin 80 mg syringe; Commonly known as: LOVENOX       Return Appointments and Scheduled Appointments     Scheduled appointments:      Feb 03, 2023 2:30 PM  Office visit with Vanice Sarah, MD  Cardiovascular Medicine: [facility] ([facility]M Exam) [address]  [address], [address]  [place] [address]  [phone]            Consults, Procedures, Diagnostics, Micro, Pathology  Consults: None  Surgical Procedures & Dates:  LAAO with Watchman Device  Significant Diagnostic Studies, Micro and Procedures: Limited ECHO  Significant Pathology: None    LAAO with Watchman device 11/19/2022  Plan:  Recommend transitioning to aspirin 81 mg daily plus Eliquis 2.5 mg twice daily for the next 3 months followed by repeat CTA/TEE.  If no thrombus is noted, recommend rescheduling for left atrial appendage occlusion under general anesthesia and transesophageal echo.    Limited echocardiogram 11/19/2022    The left ventricular size is normal. Wall thickness is increased. Concentric hypertrophy. The left ventricular systolic function is normal. The visually estimated ejection fraction is 60%.    Moderate tricuspid regurgitation.    No pericardial effusion.    Please see report below for other details.    When compared to prior study from 05/08/2021, LVEF remains similar.    Discharge Disposition, Condition   Patient Disposition: Home or Self Care [01]  Condition at Discharge: Stable    Code Status   Full Code    Patient Instructions     Activity       Procedure Specific Activity   As directed      *You may drive after 2 days.  *You may shower after discharge.  *NO tub baths, hot tubs, or swimming for 5 days.  *NO lifting greater than 15 pounds for 1 week.  *NO sexual or strenuous activity for 1 week.          Diet       Cardiac Diet   As directed      Limiting unhealthy fats and cholesterol is the most important step you can take in reducing your risk for cardiovascular disease.  Unhealthy fats include saturated and trans fats.  Monitor your sodium and cholesterol intake.  Restrict your sodium to 2g (grams) or 2000mg  (milligrams) daily, and your cholesterol to 200mg  daily.    If you have questions regarding your diet at home, you may contact a dietitian at [phone].          Wounds/Lines/Drains       Incision Care   As directed      *Call if there is an increase in pain, swelling, or redness.  *DO NOT soak incision in water.  *NO tub baths, hot tubs, or swimming.  *You may shower after discharge.             Discharge education provided to patient. and Others Instructions:   Other Orders       Discharge Comments   Complete by: As directed      Order comments: - Resume Eliquis tonight after 8 pm.  - Please start taking aspirin 81mg  daily in addition to your Eliquis.    Questions About Your Stay   Complete by: As directed      Discharging attending physician: Julienne Kass    Order comments: For questions or concerns regarding your hospital stay:    - DURING BUSINESS HOURS (8:00 AM - 4:30 PM):  Call [phone] and asked to be transferred to your discharge attending physician.    - AFTER BUSINESS HOURS (4:30 PM - 8:00 AM, on weekends, or holidays):  Call [phone] and ask the operator to page the on-call doctor for the discharge attending physician.            Additional Orders: Case Management, Supplies, Home Health     Home Health/DME       None  Signed:  Jennell Corner, APRN-NP  11/19/2022      cc:  Primary Care Physician:  Hoyt Koch   Verified    Referring physicians:  Julienne Kass, *   Additional provider(s):        Did we miss something? If additional records are needed, please fax a request on office letterhead to [phone]. Please include the patient's name, date of birth, fax number and type of information needed. Additional request can be made by email at [email or web address]. For general questions of information about electronic records sharing, call [phone].

## 2022-11-19 NOTE — Progress Notes
Consent and pre-procedure conscious sedation assessment    Patient:  Joel Hernandez    Procedures to be performed    Transcatheter left atrial appendage occlusion under ICE with moderate conscious sedation by cath lab RNs  Left atrial appendage angiograms  Transseptal left atrial access  Left heart catheterization    I have explained the risks, benefits and alternatives of the procedure to the patient in detail including risk of access site complications, bleeding, perforation, device erosion/embolization need for emergent cardiac surgery, death, contrast nephropathy, intracardiac echo related complications (if used) radiation hazards, stroke and other unforeseen complications.  The patient verbalized understanding of these conditions and consented to the procedure.    CONSCIOUS SEDATION ASSESSMENT    Appropriate history and physical on chart?    yes  Risks, benefits and options of conscious sedation discussed with patient.  Previous anesthesia experiences reviewed.  Informed consent obtained?    yes    Physical exam:    Heart:  normal rate, regular rhythm, normal S1, S2, no murmurs, rubs, clicks or gallops, normal S1, S2.  No murmurs, rubs or gallops.  Lungs:  clear to auscultation and clear to auscultation, unlabored breathing.  Airway:  normal    NPO status per policy:  yes    Current status unchanged since initial evaluation: yes    Time of last oral intake: NPO since midnight    Airway and ASA assessment : ASA II, Mallampati II    WMANESTHESIA: Moderate conscious sedation by cath lab RNs    Physician has discussed risks and alternatives of this type sedation and above planned procedure(s) with: Patient    Allergies:   Allergies   Allergen Reactions    Lisinopril SHORTNESS OF BREATH       Current Medications: Reviewed    Appropriate labs/diagnostic tests: Reviewed    Have the patient or anyone in the patient's family ever had a history of sedation/anesthesia complications? No    Jonell Cluck, MD, [facility], [facility]  Interventional Cardiologist

## 2022-11-20 ENCOUNTER — Encounter: Admit: 2022-11-20 | Discharge: 2022-11-20 | Payer: MEDICARE

## 2022-11-20 NOTE — Telephone Encounter
Patient's identity was verified using two patient identifiers (name an date of birth).  Talked to patient's son, Onalee Hua    02/24/2023 at 11:30 am   Office Visit with Johnathan Hausen APRN at the [place] [facility].  Medical [facility],  [address], [address] [place], [place]  [address]     02/24/2023 at 12:00 noon     (arrival time) TEE (Transesophageal Echocardiogram)  [address], [address]  [place], [place] [address]    TEE Instructions:  Nothing to eat or drink after midnight (including gum, mints, smokeless tobacco, communion).  You must have a designated person who will be driving you home and staying with you after the procedure.  Please take all prescribed medications the morning of your procedure with a sip of water unless otherwise instructed by your nurse.  HOLD all vitamin supplements the morning of your procedure.    If you have any questions, please call the Procedure RNs office at [phone]     Onalee Hua verbalized understanding of date/times/location and instructions.

## 2022-11-20 NOTE — Telephone Encounter
-----   Message from Olen Pel, MD sent at 11/19/2022 10:59 AM CDT -----  Large thrombus in LAA on ICE  I decided not to proceed with LAAO  Recommend continuing Eliquis at current dose with aspirin 81 daily for 3 months, then rpt TEE or CTA to rule out thrombus and then proceed with GA and TEE  Thanks

## 2022-11-27 ENCOUNTER — Encounter: Admit: 2022-11-27 | Discharge: 2022-11-27 | Payer: MEDICARE

## 2022-12-01 ENCOUNTER — Encounter: Admit: 2022-12-01 | Discharge: 2022-12-01 | Payer: MEDICARE

## 2022-12-05 ENCOUNTER — Encounter: Admit: 2022-12-05 | Discharge: 2022-12-05 | Payer: MEDICARE

## 2022-12-15 IMAGING — CT CHEST WO(Adult)
2 of 6 series · 15 of 36 positions shown, 18 images · non-contrast
Comparison: none

[Series 4: thorax cor 1.50 br40 s3 · coronal · 0.72mm/px · 3 of 228 slices shown]
[im 46/228  lung]
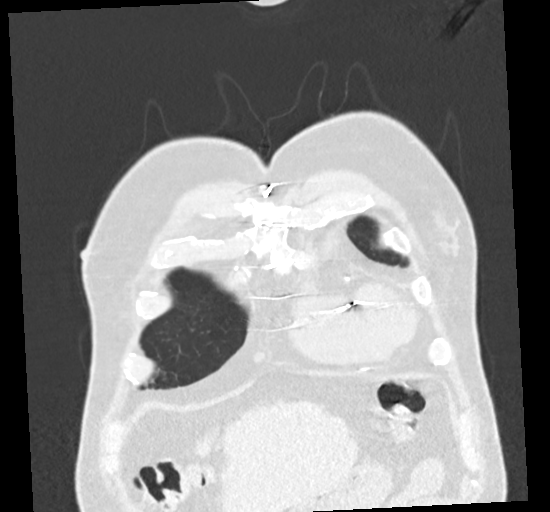
[im 91/228  lung]
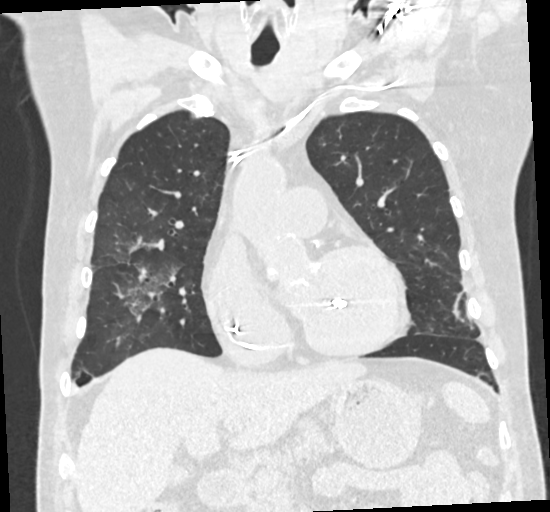
[im 137/228  lung]
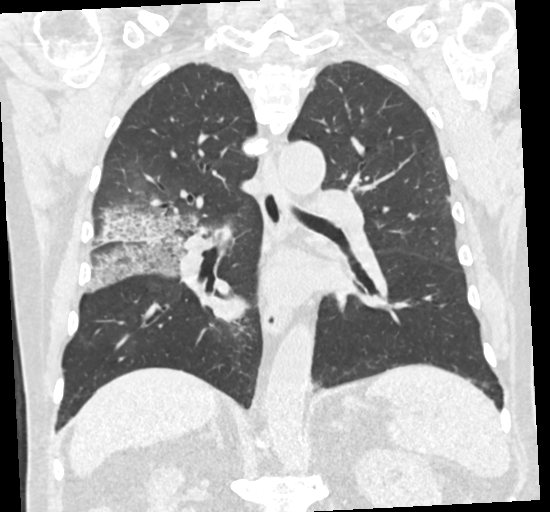

[Series 11: thorax 1.00 br60 s3 · axial · 0.74mm/px · z∈[+1528,+1838]mm · 12 of 487 slices shown, 15 images]
[im 38/487  mediastinal]
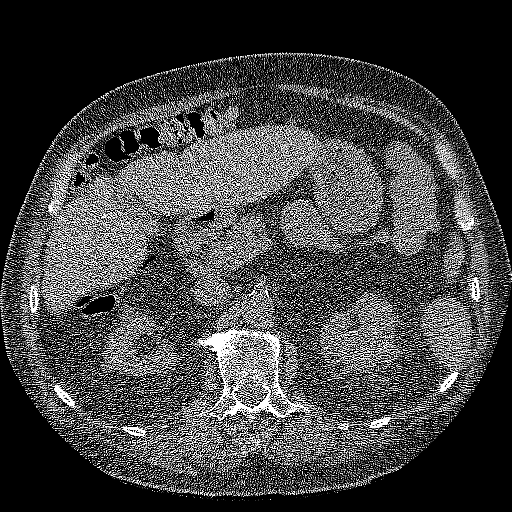
[im 38/487  lung]
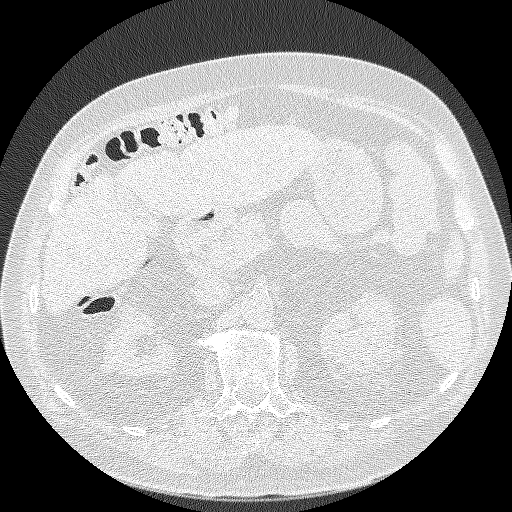
[im 75/487  lung]
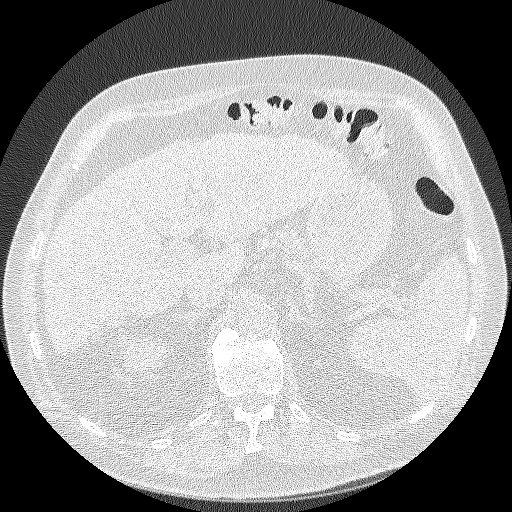
[im 113/487  lung]
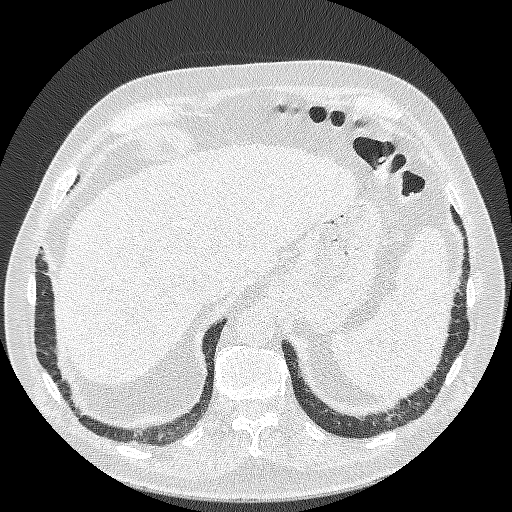
[im 150/487  lung]
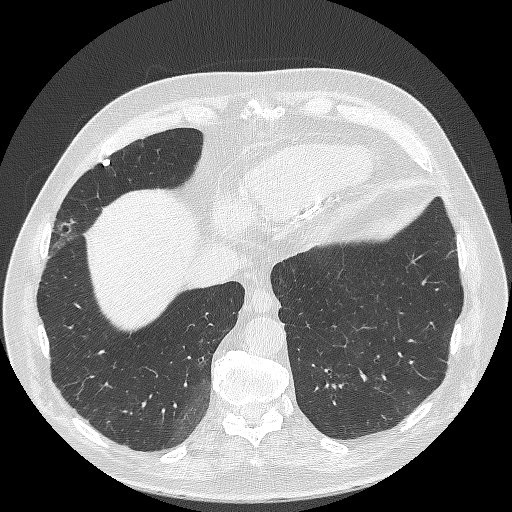
[im 187/487  mediastinal]
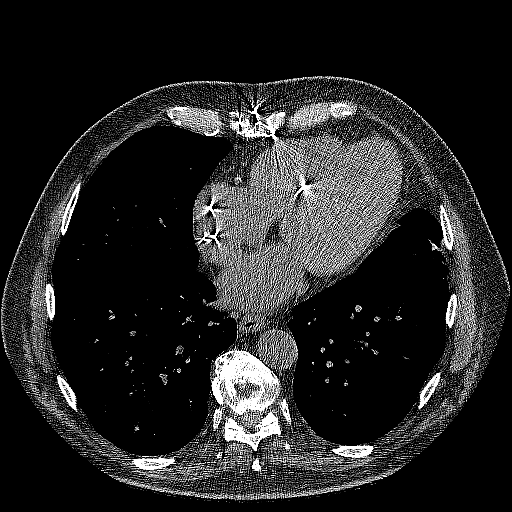
[im 187/487  lung]
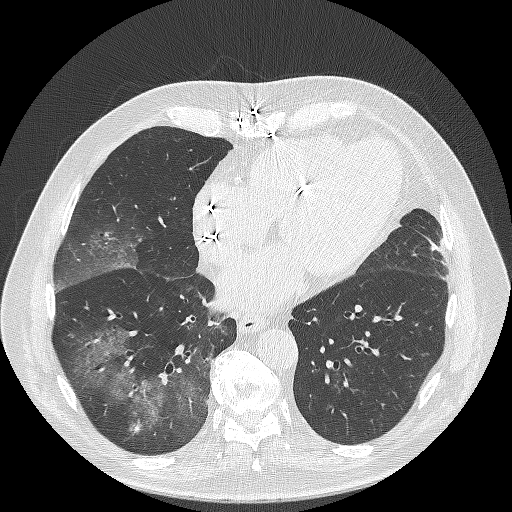
[im 225/487  lung]
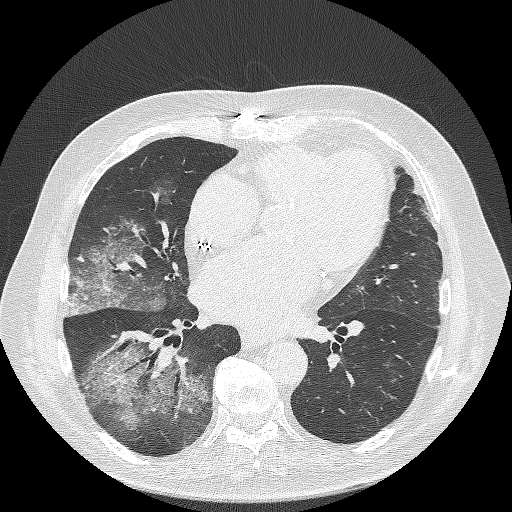
[im 262/487  lung]
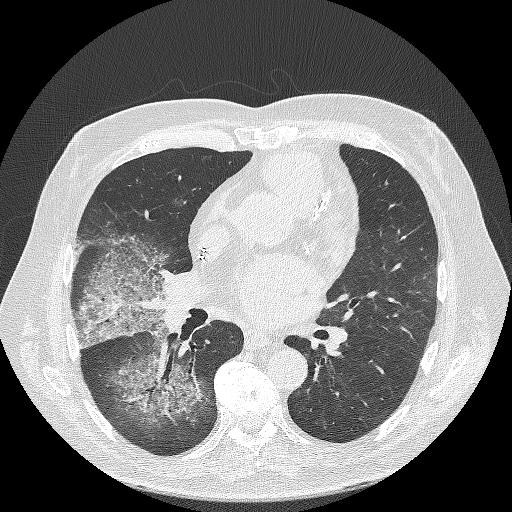
[im 300/487  lung]
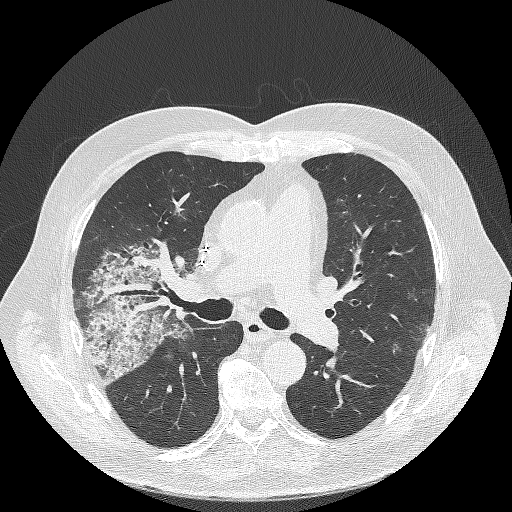
[im 337/487  mediastinal]
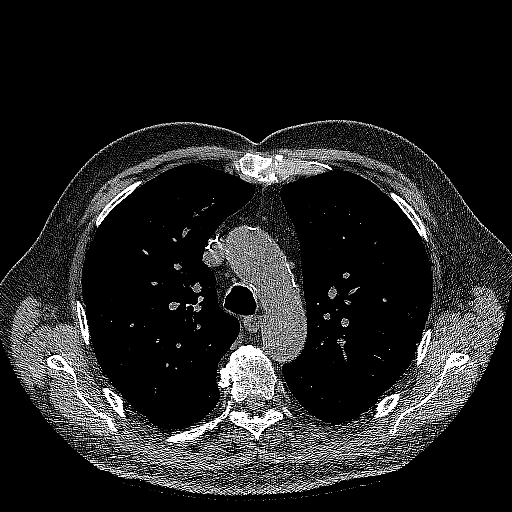
[im 337/487  lung]
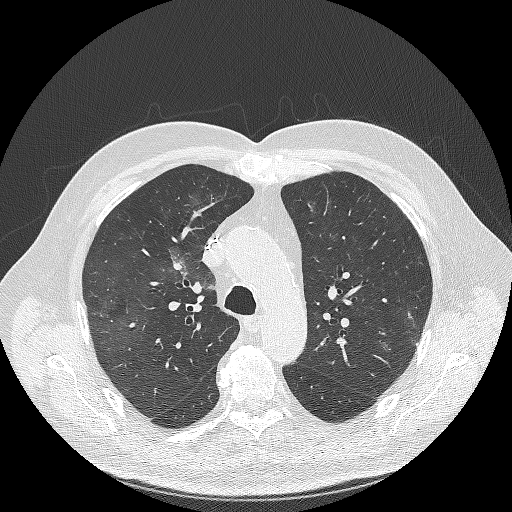
[im 374/487  lung]
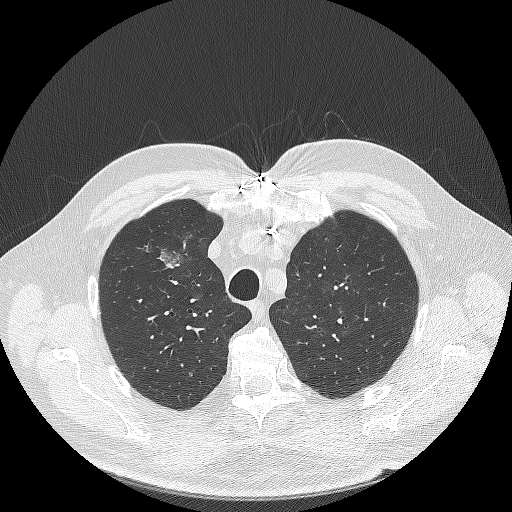
[im 412/487  lung]
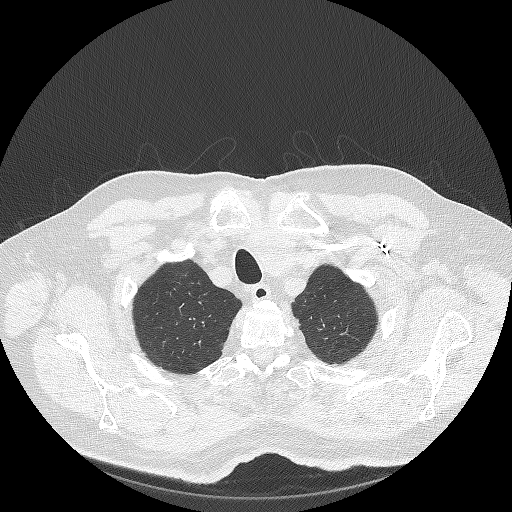
[im 449/487  lung]
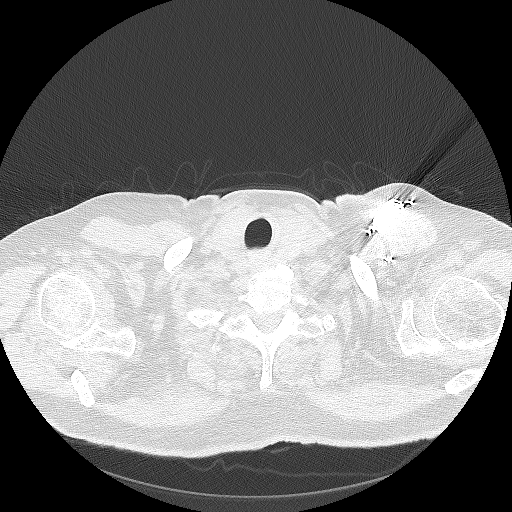

[15 of 36 positions shown; findings below may reference images not displayed]

DIAGNOSTIC STUDIES

EXAM

COMPUTED TOMOGRAPHY, THORAX; WITHOUT CONTRAST MATERIAL CPT 85288

INDICATION

hemoptysis, cough
hemoptysis, more blood at night than daytime, sometimes mucus as well, pacemaker surgery. past
smoker quit 50 years ago. ct/nm 0/0. kf/cs

TECHNIQUE

Contiguous axial tomographic images were obtained from the lung apices to the upper abdomen without
intravenous contrast. Coronal and sagittal reformatted images were obtained.

All CT scans at this facility use dose modulation, iterative reconstruction, and/or weight based
dosing when appropriate to reduce radiation dose to as low as reasonably achievable.

0 CT and 0 nuclear scans in the last year.

COMPARISONS

No priors available for comparison.

FINDINGS

Intrathoracic goiter. Cardiomegaly. Severe coronary artery disease. Permanent pacemaker.
Sternotomy. No pathologically enlarged lymph nodes. Slightly limited evaluation for right hilar
adenopathy secondary to the absence of intravenous contrast. No pleural effusion. Mild centrilobular
emphysematous changes. Minimal atelectasis versus scarring within the lingula. Subtle subpleural
ground-glass opacity within the left upper lobe, image 110. There is a subtle patchy ground-glass
opacity within the lingula. No gross pneumothorax. Right perihilar patchy consolidation. 4
millimeter right lower lobe nodule, image 179. 1.8 cm ground-glass nodule within the right upper
lobe, image 78. 1.5 cm ground-glass nodule within the right upper lobe, image 58. 3 millimeter right
upper lobe nodule, image no pneumoperitoneum. Cirrhosis. Limited evaluation for the detection of
hepatocellular carcinoma. There is a least mild splenomegaly, 13.4 cm. There is a 1.3 cm
gastrohepatic node. Flowing anterior osteophytes within the thoracic spine. Nonspecific sclerotic
focus within the T7 vertebral body.

IMPRESSION

1. COPD. Right perihilar patchy consolidation, suspicious for infection. Differential diagnosis
includes pulmonary hemorrhage.

Follow-up with a pulmonary consultation is recommended.

2. Cirrhosis.

Tech Notes:

hemoptysis, more blood at night than daytime, sometimes mucus as well, pacemaker surgery. past
smoker quit 50 years ago. ct/nm 0/0. kf/cs

## 2023-01-06 ENCOUNTER — Encounter: Admit: 2023-01-06 | Discharge: 2023-01-06 | Payer: MEDICARE

## 2023-01-06 NOTE — Telephone Encounter
Patient is clear from a cardiac perspective for upcoming epidural.  He is approved to hold anticoagulation/antiplatelet for 3 days prior to proposed procedure.    Dr. Danella Maiers M.D.

## 2023-01-16 ENCOUNTER — Encounter: Admit: 2023-01-16 | Discharge: 2023-01-16 | Payer: MEDICARE

## 2023-01-23 ENCOUNTER — Encounter: Admit: 2023-01-23 | Discharge: 2023-01-23 | Payer: MEDICARE

## 2023-01-23 NOTE — Telephone Encounter
Dr. Isaac Bliss office called requesting cardiac clearance for upcoming procedure.  Pr is scheduled for an excision of a mass on right buttock.  They also need to hold Eliquis for 2 days prior to procedure.    Clarified with Dr. Barry Dienes if patient is clear for this procedure and okay to hold medication as he has been approved for pain procedure.      Dr. Barry Dienes states patient is clear for this procedure as well and can hold Eliquis as requested.    Faxed note to Dr. Isaac Bliss office.

## 2023-01-26 ENCOUNTER — Encounter: Admit: 2023-01-26 | Discharge: 2023-01-26 | Payer: MEDICARE

## 2023-01-26 DIAGNOSIS — I4821 Permanent atrial fibrillation: Secondary | ICD-10-CM

## 2023-01-26 DIAGNOSIS — E7800 Pure hypercholesterolemia, unspecified: Secondary | ICD-10-CM

## 2023-01-26 DIAGNOSIS — Z952 Presence of prosthetic heart valve: Secondary | ICD-10-CM

## 2023-01-26 DIAGNOSIS — I10 Essential (primary) hypertension: Secondary | ICD-10-CM

## 2023-01-26 DIAGNOSIS — I2510 Atherosclerotic heart disease of native coronary artery without angina pectoris: Secondary | ICD-10-CM

## 2023-01-26 NOTE — Progress Notes
Received records from [facility]. Records sent to [facility] via email to scan into patient's Epic/O2 chart.

## 2023-01-26 NOTE — Progress Notes
Records Request    Medical records request for continuation of care:    Joel Hernandez   DOB:  10-11-1940    Patient has appointment on 02/03/2023   with  Dr. Danella Maiers* .        **Please fax records to [facility] [place] [facility] [place] [phone]          Request records: STAT                        EKG Tracing/Strip (December 2024)                                   Thank you,      Cardiovascular Medicine  [facility]  [address]  [place], [place] [address]  Phone:  [phone]  Fax:  [phone]

## 2023-01-30 ENCOUNTER — Encounter: Admit: 2023-01-30 | Discharge: 2023-01-30 | Payer: MEDICARE

## 2023-02-03 ENCOUNTER — Encounter: Admit: 2023-02-03 | Discharge: 2023-02-03 | Payer: MEDICARE

## 2023-02-03 NOTE — Progress Notes
Patient was scheduled for a [employer] Carelink on 01/28/23 that has not been received. Patient was instructed to send a manual transmission. Instructed if the transmitter does not appear to be working properly, they need to contact the device company directly. Patient was provided with that contact number. Requested the patient send [place] a MyChart message or contact our device nurses at [phone] to let us know after they have sent their transmission. Mychart sent to pt. BC      Note: Patient needs to send a manual transmission as we did not receive their scheduled remote interrogation.

## 2023-02-06 ENCOUNTER — Encounter: Admit: 2023-02-06 | Discharge: 2023-02-06 | Payer: MEDICARE

## 2023-02-06 NOTE — Telephone Encounter
Annamarie Dawley, MD  P [facility] Nurse Ep Team D  Caller: Unspecified (Today, 11:17 AM)  Okay.  If they are hospice, with plan for no interventions to prolong life, we do not need to do anything and we can document accordingly.  Also please let them know the option of withdrawing artificial pacing support if inconsistent with goals of care.

## 2023-02-06 NOTE — Telephone Encounter
Called patient to discuss.   Spoke with his son.  Patient had fallen and broke his hip a couple weeks ago.   He had some complications, blood loss and other issues.   Patient was in a nursing home for about a week.   The family brought patient home yesterday and they are transitioning everything to pain management and hospice care with no further treatment.

## 2023-02-06 NOTE — Telephone Encounter
Annamarie Dawley, MD  [facility] Nurse Ep Team D  Caller: Unspecified (Today,  8:06 AM)  Need to get him in today for an office device check and check underlying rhythm.  Check RV lead thresholds unipolar and bipolar including at 1 ms pulse width.  If completely dependent and threshold has increased by 2-3 folds both unipolar/bipolar, then will bring him in for lead revision.

## 2023-02-06 NOTE — Telephone Encounter
-----   Message from Liberty Z sent at 02/06/2023  5:27 AM CST -----  Regarding: RED ALERT FOR HIGH RV THRESHOLD  Patient has not previously followed with ep. Patient was previously seen by [facility] for [facility] consult. Please have him review as patient only follows with Gen card and this needs to be reviewed with EP. Red alert received for High RV thresholds measured for at least 3 days starting on 23-Jan-2023. Auto RV thresholds have been measuring high on trend. RV lead impedance looks to be stable with prior trends. Due to high auto thresholds, RV output have adapted to max output at 5V @ 1.0 ms. 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 Please review full report and follow up.    Also this will not alert again for high RV thresholds until this alert is reset with an in office device check, just FYI.     Thanks,  Morrie Sheldon

## 2023-02-09 ENCOUNTER — Encounter: Admit: 2023-02-09 | Discharge: 2023-02-09 | Payer: MEDICARE

## 2023-02-15 ENCOUNTER — Ambulatory Visit: Admit: 2023-02-15 | Discharge: 2023-02-16 | Payer: MEDICARE

## 2023-02-28 DEATH — deceased

## 2023-04-06 ENCOUNTER — Encounter: Admit: 2023-04-06 | Discharge: 2023-04-06 | Payer: MEDICARE
